# Patient Record
Sex: Male | Born: 1940
Health system: Southern US, Community
[De-identification: ages and names within clinical notes are randomized; demographics above are authoritative.]

## PROBLEM LIST (undated history)

## (undated) ENCOUNTER — Ambulatory Visit: Admission: EM | Payer: Medicare Other | Source: Home / Self Care

## (undated) DIAGNOSIS — R0609 Other forms of dyspnea: Secondary | ICD-10-CM

## (undated) DIAGNOSIS — Z8719 Personal history of other diseases of the digestive system: Secondary | ICD-10-CM

## (undated) DIAGNOSIS — E785 Hyperlipidemia, unspecified: Secondary | ICD-10-CM

## (undated) DIAGNOSIS — H6120 Impacted cerumen, unspecified ear: Secondary | ICD-10-CM

## (undated) DIAGNOSIS — Z85828 Personal history of other malignant neoplasm of skin: Secondary | ICD-10-CM

## (undated) DIAGNOSIS — I10 Essential (primary) hypertension: Secondary | ICD-10-CM

## (undated) DIAGNOSIS — F0390 Unspecified dementia without behavioral disturbance: Secondary | ICD-10-CM

## (undated) DIAGNOSIS — M1990 Unspecified osteoarthritis, unspecified site: Secondary | ICD-10-CM

## (undated) DIAGNOSIS — N183 Chronic kidney disease, stage 3 (moderate): Secondary | ICD-10-CM

## (undated) DIAGNOSIS — R0600 Dyspnea, unspecified: Secondary | ICD-10-CM

## (undated) DIAGNOSIS — K2270 Barrett's esophagus without dysplasia: Secondary | ICD-10-CM

## (undated) DIAGNOSIS — I509 Heart failure, unspecified: Secondary | ICD-10-CM

## (undated) DIAGNOSIS — H269 Unspecified cataract: Secondary | ICD-10-CM

## (undated) DIAGNOSIS — T7840XA Allergy, unspecified, initial encounter: Secondary | ICD-10-CM

## (undated) DIAGNOSIS — I25810 Atherosclerosis of coronary artery bypass graft(s) without angina pectoris: Secondary | ICD-10-CM

## (undated) DIAGNOSIS — N400 Enlarged prostate without lower urinary tract symptoms: Secondary | ICD-10-CM

## (undated) DIAGNOSIS — K4090 Unilateral inguinal hernia, without obstruction or gangrene, not specified as recurrent: Secondary | ICD-10-CM

## (undated) DIAGNOSIS — I259 Chronic ischemic heart disease, unspecified: Secondary | ICD-10-CM

## (undated) DIAGNOSIS — K219 Gastro-esophageal reflux disease without esophagitis: Secondary | ICD-10-CM

## (undated) DIAGNOSIS — I200 Unstable angina: Secondary | ICD-10-CM

## (undated) DIAGNOSIS — I219 Acute myocardial infarction, unspecified: Secondary | ICD-10-CM

## (undated) DIAGNOSIS — K4190 Unilateral femoral hernia, without obstruction or gangrene, not specified as recurrent: Secondary | ICD-10-CM

## (undated) DIAGNOSIS — J189 Pneumonia, unspecified organism: Secondary | ICD-10-CM

## (undated) DIAGNOSIS — I2510 Atherosclerotic heart disease of native coronary artery without angina pectoris: Secondary | ICD-10-CM

## (undated) DIAGNOSIS — M519 Unspecified thoracic, thoracolumbar and lumbosacral intervertebral disc disorder: Secondary | ICD-10-CM

## (undated) DIAGNOSIS — R9431 Abnormal electrocardiogram [ECG] [EKG]: Secondary | ICD-10-CM

## (undated) DIAGNOSIS — I119 Hypertensive heart disease without heart failure: Secondary | ICD-10-CM

## (undated) HISTORY — DX: Benign prostatic hyperplasia without lower urinary tract symptoms: N40.0

## (undated) HISTORY — DX: Unstable angina: I20.0

## (undated) HISTORY — PX: COLONOSCOPY: SHX174

## (undated) HISTORY — DX: Abnormal electrocardiogram (ECG) (EKG): R94.31

## (undated) HISTORY — DX: Hypertensive heart disease without heart failure: I11.9

## (undated) HISTORY — DX: Atherosclerotic heart disease of native coronary artery without angina pectoris: I25.10

## (undated) HISTORY — DX: Gastro-esophageal reflux disease without esophagitis: K21.9

## (undated) HISTORY — PX: EYE SURGERY: SHX253

## (undated) HISTORY — DX: Unspecified osteoarthritis, unspecified site: M19.90

## (undated) HISTORY — DX: Unilateral inguinal hernia, without obstruction or gangrene, not specified as recurrent: K40.90

## (undated) HISTORY — DX: Acute myocardial infarction, unspecified: I21.9

## (undated) HISTORY — DX: Unspecified cataract: H26.9

## (undated) HISTORY — DX: Chronic ischemic heart disease, unspecified: I25.9

## (undated) HISTORY — DX: Hyperlipidemia, unspecified: E78.5

## (undated) HISTORY — DX: Heart failure, unspecified: I50.9

## (undated) HISTORY — DX: Unilateral femoral hernia, without obstruction or gangrene, not specified as recurrent: K41.90

## (undated) HISTORY — DX: Barrett's esophagus without dysplasia: K22.70

## (undated) HISTORY — DX: Impacted cerumen, unspecified ear: H61.20

## (undated) HISTORY — DX: Chronic kidney disease, stage 3 (moderate): N18.3

## (undated) HISTORY — DX: Essential (primary) hypertension: I10

## (undated) HISTORY — DX: Pneumonia, unspecified organism: J18.9

## (undated) HISTORY — DX: Allergy, unspecified, initial encounter: T78.40XA

## (undated) HISTORY — DX: Other forms of dyspnea: R06.09

---

## 1981-04-28 HISTORY — PX: INGUINAL HERNIA REPAIR: SUR1180

## 1983-04-29 HISTORY — PX: NOSE SURGERY: SHX723

## 1998-02-20 ENCOUNTER — Encounter: Payer: Self-pay | Admitting: Emergency Medicine

## 1998-02-20 ENCOUNTER — Emergency Department (HOSPITAL_COMMUNITY): Admission: EM | Admit: 1998-02-20 | Discharge: 1998-02-20 | Payer: Self-pay | Admitting: Emergency Medicine

## 2000-02-26 ENCOUNTER — Emergency Department (HOSPITAL_COMMUNITY): Admission: EM | Admit: 2000-02-26 | Discharge: 2000-02-26 | Payer: Self-pay | Admitting: Emergency Medicine

## 2000-02-26 ENCOUNTER — Encounter: Payer: Self-pay | Admitting: Emergency Medicine

## 2000-04-28 DIAGNOSIS — I219 Acute myocardial infarction, unspecified: Secondary | ICD-10-CM

## 2000-04-28 HISTORY — PX: CORONARY ARTERY BYPASS GRAFT: SHX141

## 2000-04-28 HISTORY — DX: Acute myocardial infarction, unspecified: I21.9

## 2000-06-29 ENCOUNTER — Encounter: Payer: Self-pay | Admitting: Orthopedic Surgery

## 2000-06-29 ENCOUNTER — Inpatient Hospital Stay (HOSPITAL_COMMUNITY): Admission: EM | Admit: 2000-06-29 | Discharge: 2000-07-01 | Payer: Self-pay

## 2000-08-05 ENCOUNTER — Encounter: Payer: Self-pay | Admitting: Cardiothoracic Surgery

## 2000-08-06 ENCOUNTER — Inpatient Hospital Stay (HOSPITAL_COMMUNITY): Admission: AD | Admit: 2000-08-06 | Discharge: 2000-08-10 | Payer: Self-pay | Admitting: Cardiovascular Disease

## 2000-08-06 ENCOUNTER — Encounter: Payer: Self-pay | Admitting: Cardiothoracic Surgery

## 2000-08-07 ENCOUNTER — Encounter: Payer: Self-pay | Admitting: Cardiothoracic Surgery

## 2000-08-08 ENCOUNTER — Encounter: Payer: Self-pay | Admitting: Cardiothoracic Surgery

## 2000-08-08 ENCOUNTER — Encounter: Payer: Self-pay | Admitting: Thoracic Surgery (Cardiothoracic Vascular Surgery)

## 2004-03-26 ENCOUNTER — Ambulatory Visit: Payer: Self-pay | Admitting: Internal Medicine

## 2004-05-30 ENCOUNTER — Ambulatory Visit: Payer: Self-pay | Admitting: Internal Medicine

## 2004-06-04 ENCOUNTER — Ambulatory Visit: Payer: Self-pay | Admitting: Internal Medicine

## 2004-06-14 ENCOUNTER — Ambulatory Visit: Payer: Self-pay

## 2004-08-11 ENCOUNTER — Emergency Department (HOSPITAL_COMMUNITY): Admission: EM | Admit: 2004-08-11 | Discharge: 2004-08-11 | Payer: Self-pay | Admitting: Emergency Medicine

## 2004-08-14 ENCOUNTER — Ambulatory Visit: Payer: Self-pay | Admitting: Internal Medicine

## 2004-08-28 ENCOUNTER — Ambulatory Visit: Payer: Self-pay | Admitting: Internal Medicine

## 2004-09-26 ENCOUNTER — Ambulatory Visit: Payer: Self-pay | Admitting: Internal Medicine

## 2004-12-13 ENCOUNTER — Ambulatory Visit: Payer: Self-pay | Admitting: Internal Medicine

## 2005-04-15 ENCOUNTER — Ambulatory Visit: Payer: Self-pay | Admitting: Internal Medicine

## 2005-05-27 ENCOUNTER — Ambulatory Visit: Payer: Self-pay | Admitting: Internal Medicine

## 2005-08-11 ENCOUNTER — Ambulatory Visit: Payer: Self-pay | Admitting: Internal Medicine

## 2005-08-26 ENCOUNTER — Ambulatory Visit: Payer: Self-pay | Admitting: Internal Medicine

## 2005-12-02 ENCOUNTER — Ambulatory Visit: Payer: Self-pay | Admitting: Internal Medicine

## 2006-02-16 ENCOUNTER — Ambulatory Visit: Payer: Self-pay | Admitting: Gastroenterology

## 2006-03-02 ENCOUNTER — Ambulatory Visit: Payer: Self-pay | Admitting: Gastroenterology

## 2006-03-30 ENCOUNTER — Ambulatory Visit: Payer: Self-pay | Admitting: Internal Medicine

## 2006-06-29 ENCOUNTER — Ambulatory Visit: Payer: Self-pay | Admitting: Internal Medicine

## 2006-08-11 ENCOUNTER — Ambulatory Visit: Payer: Self-pay | Admitting: Internal Medicine

## 2006-09-28 ENCOUNTER — Ambulatory Visit: Payer: Self-pay | Admitting: Internal Medicine

## 2006-12-04 ENCOUNTER — Ambulatory Visit: Payer: Self-pay | Admitting: Internal Medicine

## 2006-12-04 LAB — CONVERTED CEMR LAB
ALT: 29 units/L (ref 0–53)
AST: 32 units/L (ref 0–37)
Alkaline Phosphatase: 86 units/L (ref 39–117)
BUN: 18 mg/dL (ref 6–23)
Basophils Relative: 0.6 % (ref 0.0–1.0)
Bilirubin Urine: NEGATIVE
Bilirubin, Direct: 0.1 mg/dL (ref 0.0–0.3)
CO2: 30 meq/L (ref 19–32)
Calcium: 9 mg/dL (ref 8.4–10.5)
Chloride: 97 meq/L (ref 96–112)
Creatinine, Ser: 1.1 mg/dL (ref 0.4–1.5)
Eosinophils Relative: 4.3 % (ref 0.0–5.0)
Glucose, Bld: 119 mg/dL — ABNORMAL HIGH (ref 70–99)
Lymphocytes Relative: 31.4 % (ref 12.0–46.0)
Monocytes Relative: 10.5 % (ref 3.0–11.0)
Neutro Abs: 3.5 10*3/uL (ref 1.4–7.7)
Nitrite: NEGATIVE
Platelets: 192 10*3/uL (ref 150–400)
RBC: 4.7 M/uL (ref 4.22–5.81)
RDW: 12.4 % (ref 11.5–14.6)
Specific Gravity, Urine: 1.01 (ref 1.000–1.03)
Total Bilirubin: 1.1 mg/dL (ref 0.3–1.2)
Total CHOL/HDL Ratio: 2.8
Total Protein: 7.6 g/dL (ref 6.0–8.3)
Urine Glucose: NEGATIVE mg/dL
VLDL: 23 mg/dL (ref 0–40)
WBC: 6.5 10*3/uL (ref 4.5–10.5)
pH: 8.5 — AB (ref 5.0–8.0)

## 2006-12-17 ENCOUNTER — Ambulatory Visit: Payer: Self-pay | Admitting: Internal Medicine

## 2007-02-22 ENCOUNTER — Ambulatory Visit: Payer: Self-pay | Admitting: Internal Medicine

## 2007-08-31 ENCOUNTER — Telehealth (INDEPENDENT_AMBULATORY_CARE_PROVIDER_SITE_OTHER): Payer: Self-pay | Admitting: *Deleted

## 2007-09-09 ENCOUNTER — Ambulatory Visit: Payer: Self-pay | Admitting: Internal Medicine

## 2007-09-09 DIAGNOSIS — J309 Allergic rhinitis, unspecified: Secondary | ICD-10-CM | POA: Insufficient documentation

## 2007-09-09 DIAGNOSIS — I119 Hypertensive heart disease without heart failure: Secondary | ICD-10-CM | POA: Insufficient documentation

## 2007-09-09 DIAGNOSIS — E785 Hyperlipidemia, unspecified: Secondary | ICD-10-CM | POA: Insufficient documentation

## 2007-09-09 DIAGNOSIS — M2550 Pain in unspecified joint: Secondary | ICD-10-CM | POA: Insufficient documentation

## 2007-09-13 ENCOUNTER — Ambulatory Visit: Payer: Self-pay | Admitting: Internal Medicine

## 2007-10-01 ENCOUNTER — Encounter: Payer: Self-pay | Admitting: Internal Medicine

## 2008-01-04 ENCOUNTER — Encounter: Admission: RE | Admit: 2008-01-04 | Discharge: 2008-02-08 | Payer: Self-pay | Admitting: Orthopedic Surgery

## 2008-01-17 ENCOUNTER — Ambulatory Visit: Payer: Self-pay | Admitting: Internal Medicine

## 2008-04-04 ENCOUNTER — Ambulatory Visit: Payer: Self-pay | Admitting: Internal Medicine

## 2008-04-04 DIAGNOSIS — J069 Acute upper respiratory infection, unspecified: Secondary | ICD-10-CM | POA: Insufficient documentation

## 2008-04-05 LAB — CONVERTED CEMR LAB
AST: 42 units/L — ABNORMAL HIGH (ref 0–37)
Alkaline Phosphatase: 76 units/L (ref 39–117)
Basophils Absolute: 0.1 10*3/uL (ref 0.0–0.1)
Bilirubin, Direct: 0.1 mg/dL (ref 0.0–0.3)
CO2: 29 meq/L (ref 19–32)
Chloride: 104 meq/L (ref 96–112)
Cholesterol: 154 mg/dL (ref 0–200)
Eosinophils Absolute: 0.2 10*3/uL (ref 0.0–0.7)
Eosinophils Relative: 3.2 % (ref 0.0–5.0)
GFR calc non Af Amer: 64 mL/min
Hemoglobin, Urine: NEGATIVE
LDL Cholesterol: 70 mg/dL (ref 0–99)
Leukocytes, UA: NEGATIVE
Lymphocytes Relative: 28.7 % (ref 12.0–46.0)
MCHC: 35.1 g/dL (ref 30.0–36.0)
MCV: 95.6 fL (ref 78.0–100.0)
Mucus, UA: NEGATIVE
Neutrophils Relative %: 58.8 % (ref 43.0–77.0)
Platelets: 174 10*3/uL (ref 150–400)
Potassium: 4 meq/L (ref 3.5–5.1)
RBC: 4.48 M/uL (ref 4.22–5.81)
Sodium: 140 meq/L (ref 135–145)
Specific Gravity, Urine: 1.01 (ref 1.000–1.03)
Squamous Epithelial / LPF: NEGATIVE /lpf
TSH: 2.09 microintl units/mL (ref 0.35–5.50)
Total Bilirubin: 0.9 mg/dL (ref 0.3–1.2)
Total CHOL/HDL Ratio: 2.8
Urine Glucose: NEGATIVE mg/dL
Urobilinogen, UA: 0.2 (ref 0.0–1.0)
VLDL: 29 mg/dL (ref 0–40)
WBC: 7.8 10*3/uL (ref 4.5–10.5)

## 2008-04-11 ENCOUNTER — Ambulatory Visit: Payer: Self-pay | Admitting: Internal Medicine

## 2008-05-02 ENCOUNTER — Ambulatory Visit: Payer: Self-pay

## 2008-05-02 ENCOUNTER — Ambulatory Visit: Payer: Self-pay | Admitting: Pulmonary Disease

## 2008-09-26 ENCOUNTER — Ambulatory Visit: Payer: Self-pay | Admitting: Internal Medicine

## 2008-10-26 DIAGNOSIS — I2510 Atherosclerotic heart disease of native coronary artery without angina pectoris: Secondary | ICD-10-CM | POA: Insufficient documentation

## 2008-10-27 ENCOUNTER — Ambulatory Visit: Payer: Self-pay | Admitting: Internal Medicine

## 2008-11-23 ENCOUNTER — Emergency Department (HOSPITAL_COMMUNITY): Admission: EM | Admit: 2008-11-23 | Discharge: 2008-11-23 | Payer: Self-pay | Admitting: Emergency Medicine

## 2009-04-09 ENCOUNTER — Ambulatory Visit: Payer: Self-pay | Admitting: Internal Medicine

## 2009-04-09 DIAGNOSIS — N401 Enlarged prostate with lower urinary tract symptoms: Secondary | ICD-10-CM

## 2009-04-09 DIAGNOSIS — N138 Other obstructive and reflux uropathy: Secondary | ICD-10-CM | POA: Insufficient documentation

## 2009-04-11 LAB — CONVERTED CEMR LAB
AST: 31 units/L (ref 0–37)
Albumin: 4 g/dL (ref 3.5–5.2)
Alkaline Phosphatase: 79 units/L (ref 39–117)
Basophils Absolute: 0.1 10*3/uL (ref 0.0–0.1)
Calcium: 9.2 mg/dL (ref 8.4–10.5)
GFR calc non Af Amer: 63.93 mL/min (ref >60)
Glucose, Bld: 107 mg/dL — ABNORMAL HIGH (ref 70–99)
HDL: 50.8 mg/dL (ref >39.00)
Hemoglobin: 15.5 g/dL (ref 13.0–17.0)
LDL Cholesterol: 60 mg/dL (ref 0–99)
Lymphocytes Relative: 31.1 % (ref 12.0–46.0)
Monocytes Relative: 12.7 % — ABNORMAL HIGH (ref 3.0–12.0)
Neutro Abs: 3.6 10*3/uL (ref 1.4–7.7)
RDW: 12.6 % (ref 11.5–14.6)
Sodium: 141 meq/L (ref 135–145)
Specific Gravity, Urine: <=1.005 (ref 1.000–1.030)
Total Bilirubin: 1.1 mg/dL (ref 0.3–1.2)
Total CHOL/HDL Ratio: 3
Urine Glucose: NEGATIVE mg/dL
VLDL: 39.4 mg/dL (ref 0.0–40.0)
WBC: 7 10*3/uL (ref 4.5–10.5)

## 2009-05-24 ENCOUNTER — Telehealth: Payer: Self-pay | Admitting: Internal Medicine

## 2009-07-20 ENCOUNTER — Ambulatory Visit: Payer: Self-pay | Admitting: Internal Medicine

## 2009-07-20 DIAGNOSIS — B359 Dermatophytosis, unspecified: Secondary | ICD-10-CM | POA: Insufficient documentation

## 2010-05-30 NOTE — Progress Notes (Signed)
Summary: refill  Phone Note Call from Patient Call back at Home Phone [phone]   Caller: Patient Call For: Mylene Bow Summary of Call: Pt needs a refill on omeprazole 20mg  says he has been out for about 1 month, and has called repeatedly for refill. Initial call taken by: Darletta Moll,  May 24, 2009 1:55 PM  Follow-up for Phone Call        rx sent. pt aware.Carron Curie CMA  May 24, 2009 1:57 PM     Prescriptions: OMEPRAZOLE 20 MG CPDR (OMEPRAZOLE) Take 1 capsule by mouth two times a day  #60 x 3   Entered by:   Carron Curie CMA   Authorized by:   Nyoka Cowden MD   Signed by:   Carron Curie CMA on 05/24/2009   Method used:   Electronically to        [facility] [facility] [address] (retail)       [address]       [place], [place]  [address]       Ph: [phone]       Fax: [phone]   RxID:   [id number]

## 2010-05-30 NOTE — Assessment & Plan Note (Signed)
Summary: Primary svc/ eval ? tinea left ankle   Primary Provider/Referring Provider:  Sherene [name]  CC:  3 month followup.  Pt c/o itchy rash above left ankle.  States that he noticed this about 3 wks ago and it gets worse every day.  Isaac [name]  History of Present Illness: 70 yowm with h/l IHD/HBP  ? radicular Left leg/foot pain followed by [name].  April 04, 2008 comprehensive eval 1. No change in left leg pain or advil requirement 2. Cough x 24 h slt yellow and st, no fever, cp or sob--rx doxy x 7 d  April 11, 2008 --returned for follow up and ear wash and lab review. Trouble hearing, ears stopped up, using otc debrox no help. XR showed chronic changes. Labs normal with LDL at 70 and TC at 154 on simvastatin. cough and congestion better.  September 26, 2008 ok except for the "problem with the foot" x sev years  describes burning pain along left lateral calf ankle and foot extending all the way to the toe, says Dr [name] reported all the ligaments and tendons were all wornt out.  no longer responding to advil. denies loss of strength in legs/feet or back pain or claudication symptoms.  lyrica solved it  April 09, 2009 returns fasting for comprehensive eval c/o urinary urgency just in am, no incontinence, dribbling or decreased fos/dysuria/nocturia.  July 20, 2009 3 month followup.  Pt c/o itchy rash above left ankle.  States that he noticed this about 3 wks ago and it gets worse every day.  puruitic.  no unusual exposures. Pt denies any significant sore throat, dysphagia, itching, sneezing,  nasal congestion or excess secretions,  fever, chills, sweats, unintended wt loss, pleuritic or exertional cp, hempoptysis, change in activity tolerance  orthopnea pnd or leg swelling    Current Medications (verified): 1)  Atenolol-Chlorthalidone 50-25 Mg Tabs (Atenolol-Chlorthalidone) .... Take 1 Tablet By Mouth Once A Day 2)  Adult Aspirin Ec Low Strength 81 Mg  Tbec (Aspirin) .... Take 1 Tablet By Mouth Once A  Day 3)  Centrum Silver  Tabs (Multiple Vitamins-Minerals) .Isaac [name].. 1 Once Daily 4)  Omeprazole 20 Mg Cpdr (Omeprazole) .... Take 1 Capsule By Mouth Two Times A Day 5)  Simvastatin 20 Mg Tabs (Simvastatin) .... Take 1 Tablet By Mouth Once A Day 6)  Nasonex 50 Mcg/act  Susp (Mometasone Furoate) .... Take 1-2 Sprays in Each Nostril Twice A Day 7)  Afrin Nasal Spray 0.05 %  Soln (Oxymetazoline Hcl) .... As Needed 8)  Aleve 220 Mg Tabs (Naproxen Sodium) .... Take Per Bottle Instructions With Meals 9)  Lyrica 75 Mg Caps (Pregabalin) .Isaac [name].. 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   wt 198 > 189 > 192 April 09, 2009 > 190 July 21, 2009  HEENT: nl dentition, turbinates, and orophanx. R ext ear canal  cerumen impaction, L canal nl with nl TM Neck without JVD/Nodes/TM Lungs clear to A and P bilaterally without cough on insp or exp maneuvers RRR no s3 or murmur or increase in P2 Abd soft and benign with nl excursion in the supine position. No bruits or organomegaly  EXT no edema,  from left ankle, nl pulses    Impression & Recommendations:  Problem # 1:  RINGWORM  (ICD-110.9)  Classic lesion left ankle.  rx lotrisone and f/u derm prn  Orders: Est. Patient Level III ([address])  Problem # 2:  HYPERTENSION (ICD-401.9)  His updated medication list for this problem includes:    Atenolol-chlorthalidone 50-25 Mg Tabs (Atenolol-chlorthalidone) .Isaac [name]... Take 1 tablet by mouth once a day  Orders: Est. Patient Level III ([address])  Problem # 3:  HYPERLIPIDEMIA (ICD-272.4)  Good control on rx His updated medication list for this problem includes:    Simvastatin 20 Mg Tabs (Simvastatin) .Isaac [name]... Take 1 tablet by mouth once a day  Labs Reviewed: SGOT: 31 (04/09/2009)   SGPT: 33 (04/09/2009)  Prior 10 Yr Risk Heart Disease: 11 % (09/26/2008)   HDL:50.80 (04/09/2009), 54.6 (04/04/2008)  LDL:60 (04/09/2009), 70 (04/04/2008)  Chol:150 (04/09/2009), 154 (04/04/2008)  Trig:197.0 (04/09/2009), 146 (04/04/2008)  Orders: Est. Patient Level III ([address])  Medications Added to Medication List This Visit: 1)  Lotrisone 1-0.05 % Crea (Clotrimazole-betamethasone) .... Apply twice daily  Patient Instructions: 1)  Lotrisone twice daily until rash is gone and if not gone within two weeks see Dr Jorja Loa 2)  Return for CPX Dec 2011, call sooner if needed Prescriptions: LOTRISONE 1-0.05 % CREA (CLOTRIMAZOLE-BETAMETHASONE) apply twice daily  #15 gm x 2   Entered and Authorized by:   Nyoka Cowden MD   Signed by:   Nyoka Cowden MD on 07/20/2009   Method used:   Electronically to        [facility]  [address] [facility] [address] (retail)       [address]       [place], [place]  [address]       Ph: [phone]       Fax: [phone]   RxID:   [id number]

## 2010-07-03 ENCOUNTER — Other Ambulatory Visit: Payer: Self-pay | Admitting: Dermatology

## 2010-07-04 ENCOUNTER — Encounter: Payer: Self-pay | Admitting: Internal Medicine

## 2010-07-04 ENCOUNTER — Ambulatory Visit (INDEPENDENT_AMBULATORY_CARE_PROVIDER_SITE_OTHER)
Admission: RE | Admit: 2010-07-04 | Discharge: 2010-07-04 | Disposition: A | Discharge status: Home / Self Care | Payer: Medicare Other | Source: Ambulatory Visit | Attending: Internal Medicine | Admitting: Internal Medicine

## 2010-07-04 ENCOUNTER — Encounter (INDEPENDENT_AMBULATORY_CARE_PROVIDER_SITE_OTHER): Payer: Medicare Other | Admitting: Internal Medicine

## 2010-07-04 ENCOUNTER — Other Ambulatory Visit: Payer: Medicare Other

## 2010-07-04 ENCOUNTER — Other Ambulatory Visit: Payer: Self-pay | Admitting: Internal Medicine

## 2010-07-04 DIAGNOSIS — Z0000 Encounter for general adult medical examination without abnormal findings: Secondary | ICD-10-CM

## 2010-07-04 LAB — CBC WITH DIFFERENTIAL/PLATELET
Basophils Absolute: 0 10*3/uL (ref 0.0–0.1)
Eosinophils Relative: 3.1 % (ref 0.0–5.0)
MCV: 95.6 fl (ref 78.0–100.0)
Monocytes Absolute: 0.8 10*3/uL (ref 0.1–1.0)
Monocytes Relative: 11 % (ref 3.0–12.0)
Neutrophils Relative %: 54.3 % (ref 43.0–77.0)
Platelets: 149 10*3/uL — ABNORMAL LOW (ref 150.0–400.0)
WBC: 7.2 10*3/uL (ref 4.5–10.5)

## 2010-07-04 LAB — LIPID PANEL
Cholesterol: 150 mg/dL (ref 0–200)
HDL: 47 mg/dL (ref >39.00)
LDL Cholesterol: 65 mg/dL (ref 0–99)
Total CHOL/HDL Ratio: 3
Triglycerides: 191 mg/dL — ABNORMAL HIGH (ref 0.0–149.0)
VLDL: 38.2 mg/dL (ref 0.0–40.0)

## 2010-07-04 LAB — BASIC METABOLIC PANEL
BUN: 16 mg/dL (ref 6–23)
Creatinine, Ser: 1.2 mg/dL (ref 0.4–1.5)
GFR: 65.59 mL/min (ref >60.00)

## 2010-07-04 LAB — URINALYSIS
Nitrite: NEGATIVE
Specific Gravity, Urine: 1.015 (ref 1.000–1.030)
Total Protein, Urine: NEGATIVE
pH: 8.5 (ref 5.0–8.0)

## 2010-07-04 LAB — HEPATIC FUNCTION PANEL
Bilirubin, Direct: 0.2 mg/dL (ref 0.0–0.3)
Total Bilirubin: 0.8 mg/dL (ref 0.3–1.2)

## 2010-07-05 LAB — CONVERTED CEMR LAB
PSA, Free Pct: 38 (ref >25)
PSA, Free: 0.6 ng/mL
PSA: 1.56 ng/mL (ref <=4.00)

## 2010-07-09 NOTE — Assessment & Plan Note (Signed)
Summary: Primary svc/ cpx   Primary Provider/Referring Provider:  Sherene Sires  CC:  cpx fasting.  History of Present Illness: 16 yowm with h/l IHD/HBP  ? radicular Left leg/foot pain followed by [name].  April 04, 2008 comprehensive eval 1. No change in left leg pain or advil requirement 2. Cough x 24 h slt yellow and st, no fever, cp or sob--rx doxy x 7 d  April 11, 2008 --returned for follow up and ear wash and lab review. Trouble hearing, ears stopped up, using otc debrox no help. XR showed chronic changes. Labs normal with LDL at 70 and TC at 154 on simvastatin. cough and congestion better.  September 26, 2008 ok except for the "problem with the foot" x sev years  describes burning pain along left lateral calf ankle and foot extending all the way to the toe, says Dr [name] reported all the ligaments and tendons were all wornt out.  no longer responding to advil. denies loss of strength in legs/feet or back pain or claudication symptoms.  lyrica solved it  April 09, 2009 cpx  July 04, 2010 cpx. c/o head cold with nasal congestion, scratchy throat and discolored sputum x 1 week.   Pt denies any significant sore throat, dysphagia, itching, sneezing,  nasal congestion or excess secretions,  fever, chills, sweats, unintended wt loss, pleuritic or exertional cp, hempoptysis, change in activity tolerance  orthopnea pnd or leg swelling Pt also denies any obvious fluctuation in symptoms with weather or environmental change or other alleviating or aggravating factors.         Current Medications (verified): 1)  Atenolol-Chlorthalidone 50-25 Mg Tabs (Atenolol-Chlorthalidone) .... Take 1 Tablet By Mouth Once A Day 2)  Adult Aspirin Ec Low Strength 81 Mg  Tbec (Aspirin) .... Take 1 Tablet By Mouth Once A Day 3)  Centrum Silver  Tabs (Multiple Vitamins-Minerals) .[name].. 1 Once Daily 4)  Omeprazole 20 Mg Cpdr (Omeprazole) .... Take 1 Capsule By Mouth Two Times A Day 5)  Simvastatin 20 Mg Tabs  (Simvastatin) .... Take 1 Tablet By Mouth Once A Day 6)  Nasonex 50 Mcg/act  Susp (Mometasone Furoate) .... Take 1-2 Sprays in Each Nostril Twice A Day 7)  Afrin Nasal Spray 0.05 %  Soln (Oxymetazoline Hcl) .... As Needed 8)  Aleve 220 Mg Tabs (Naproxen Sodium) .... Take Per Bottle Instructions With Meals 9)  Lyrica 75 Mg Caps (Pregabalin) .[name].. 1 By Mouth Two Times A Day 10)  Lotrisone 1-0.05 % Crea (Clotrimazole-Betamethasone) .... Apply Twice Daily  Allergies (verified): No Known Drug Allergies  Past History:  Past Medical History: ISCHEMIC HEART DISEASE (ICD-414.9) ARTHRALGIA (ICD-719.40) HYPERTENSION (ICD-401.9) HYPERLIPIDEMIA (ICD-272.4) ALLERGIC RHINITIS (ICD-477.9) HEALTH MAINTENANCE.................................................................Marland KitchenWert    -Td 2/06    -Pneumovax 12/06    -CPX July 04, 2010  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.........................................Marland KitchenCharlann Boxer  Social History: never smoked remarried after first wife died  ( emphysema Sep 13, 2001) No ETOH  Vital Signs:  Patient profile:   70 year old male Weight:      189 pounds BMI:     26.46 O2 Sat:      98 % on Room air Temp:     97.9 degrees F oral Pulse rate:   77 / minute BP sitting:   118 / 62  (left arm)  Vitals Entered By: Vernie Murders (July 04, 2010 8:47 AM)  O2 Flow:  Room air  Physical Exam  Additional Exam:  Ambulatory healthy appearing in no acute distress.  wt 198 > 189 > 192 April 09, 2009 > 190 July 21, 2009 > 189 July 04, 2010  HEENT: nl dentition, turbinates, and orophanx. R ext ear canal  cerumen impaction, L canal nl with nl TM. Edentulous Neck without JVD/Nodes/TM Lungs clear to A and P bilaterally without cough on insp or exp maneuvers RRR no s3 or murmur or increase in P2 Abd soft and benign with nl excursion in the supine position. No bruits or organomegaly  EXT no edema,  from left ankle, nl pulses GU testes down, no ih Rectal mild bph     Cholesterol               150 mg/dL                   9-147     ATP III Classification            Desirable:  < 200 mg/dL                    Borderline High:  200 - 239 mg/dL               High:  > = 240 mg/dL   Triglycerides        [H]  191.0 mg/dL                 8.2-956.2     Normal:  <150 mg/dL     Borderline High:  130 - 199 mg/dL   HDL                       86.57 mg/dL                 >84.69   VLDL Cholesterol          38.2 mg/dL                  6.2-95.2   LDL Cholesterol           65 mg/dL                    8-41  CHO/HDL Ratio:  CHD Risk                             3                    Men          Women     1/2 Average Risk     3.4          3.3     Average Risk          5.0          4.4     2X Average Risk          9.6          7.1     3X Average Risk          15.0          11.0                           Tests: (2) BMP (METABOL)   Sodium                    141 mEq/L  135-145   Potassium                 4.5 mEq/L                   3.5-5.1   Chloride                  101 mEq/L                   96-112   Carbon Dioxide            31 mEq/L                    19-32   Glucose                   99 mg/dL                    04-54   BUN                       16 mg/dL                    0-98   Creatinine                1.2 mg/dL                   1.1-9.1   Calcium                   9.4 mg/dL                   4.7-82.9   GFR                       65.59 mL/min                >60.00  Tests: (3) CBC Platelet w/Diff (CBCD)   White Cell Count          7.2 K/uL                    4.5-10.5   Red Cell Count            4.59 Mil/uL                 4.22-5.81   Hemoglobin                15.2 g/dL                   56.2-13.0   Hematocrit                43.8 %                      39.0-52.0   MCV                       95.6 fl                     78.0-100.0   MCHC                      34.7 g/dL                   86.5-78.4   RDW  13.4 %                       11.5-14.6   Platelet Count       [L]  149.0 K/uL                  150.0-400.0   Neutrophil %              54.3 %                      43.0-77.0   Lymphocyte %              31.0 %                      12.0-46.0   Monocyte %                11.0 %                      3.0-12.0   Eosinophils%              3.1 %                       0.0-5.0   Basophils %               0.6 %                       0.0-3.0   Neutrophill Absolute      3.9 K/uL                    1.4-7.7   Lymphocyte Absolute       2.2 K/uL                    0.7-4.0   Monocyte Absolute         0.8 K/uL                    0.1-1.0  Eosinophils, Absolute                             0.2 K/uL                    0.0-0.7   Basophils Absolute        0.0 K/uL                    0.0-0.1  Tests: (4) Hepatic/Liver Function Panel (HEPATIC)   Total Bilirubin           0.8 mg/dL                   2.9-5.6   Direct Bilirubin          0.2 mg/dL                   2.1-3.0   Alkaline Phosphatase      73 U/L                      39-117   AST                       33 U/L  0-37   ALT                       34 U/L                      0-53   Total Protein             7.3 g/dL                    1.6-1.0   Albumin                   4.1 g/dL                    9.6-0.4  Tests: (5) TSH (TSH)   FastTSH                   2.03 uIU/mL                 0.35-5.50  Tests: (6) UDip Only (UDIP)   Color                     LT. YELLOW       RANGE:  Yellow;Lt. Yellow   Clarity                   CLEAR                       Clear   Specific Gravity          1.015                       1.000 - 1.030   Urine Ph                  8.5                         5.0-8.0   Protein                   NEGATIVE                    Negative   Urine Glucose             NEGATIVE                    Negative   Ketones                   NEGATIVE                    Negative   Urine Bilirubin           NEGATIVE                    Negative   Blood                      NEGATIVE                    Negative   Urobilinogen              0.2                         0.0 - 1.0   Leukocyte Esterace  NEGATIVE                    Negative   Nitrite                   NEGATIVE                    Negative  CXR  Procedure date:  07/04/2010  Findings:       Comparison: April 09, 2009  Findings: The cardiac silhouette, mediastinum, pulmonary vasculature are within normal limits.  Both lungs are clear. There is no acute bony abnormality.  IMPRESSION: There is no evidence of acute cardiac or pulmonary process.  Impression & Recommendations:  Problem # 1:  HYPERLIPIDEMIA (ICD-272.4)  His updated medication list for this problem includes:    Simvastatin 20 Mg Tabs (Simvastatin) .[name]... Take 1 tablet by mouth once a day  Target LDL < 70 achieved on present rx  Problem # 2:  URI, ACUTE (ICD-465.9)  His updated medication list for this problem includes:    Adult Aspirin Ec Low Strength 81 Mg Tbec (Aspirin) .[name]... Take 1 tablet by mouth once a day    Aleve 220 Mg Tabs (Naproxen sodium) .[name]... Take per bottle instructions with meals  rx zpak  Problem # 3:  ISCHEMIC HEART DISEASE (ICD-414.9)  His updated medication list for this problem includes:    Atenolol-chlorthalidone 50-25 Mg Tabs (Atenolol-chlorthalidone) .[name]... Take 1 tablet by mouth once a day    Adult Aspirin Ec Low Strength 81 Mg Tbec (Aspirin) .[name]... Take 1 tablet by mouth once a day  Problem # 4:  ALLERGIC RHINITIS (ICD-477.9)  His updated medication list for this problem includes:    Nasonex 50 Mcg/act Susp (Mometasone furoate) .[name]... Take 1-2 sprays in each nostril twice a day    Afrin Nasal Spray 0.05 % Soln (Oxymetazoline hcl) .[name]... As needed  See instructions for specific recommendations   Medications Added to Medication List This Visit: 1)  Nasonex 50 Mcg/act Susp (Mometasone furoate) .... Take 1-2 sprays in each nostril twice a day 2)  Zithromax Z-pak 250 Mg Tabs (Azithromycin)  .... Take as directed  Other Orders: EKG w/ Interpretation (93000) Est. Patient 65& > (78469) T-2 View CXR (71020TC) T-PSA Free ([address]) TLB-Lipid Panel (80061-LIPID) TLB-BMP (Basic Metabolic Panel-BMET) (80048-METABOL) TLB-CBC Platelet - w/Differential (85025-CBCD) TLB-Hepatic/Liver Function Pnl (80076-HEPATIC) TLB-TSH (Thyroid Stimulating Hormone) (84443-TSH) TLB-Udip ONLY (81003-UDIP)  Patient Instructions: 1)  zpak has been called in, this should turn your mucus white again if not call after one week 2)  I emphasized that nasal steroids(nasonex) have no immediate benefit in terms of improving symptoms.  To help them reached the target tissue, the patient should use Afrin two puffs every 12 hours applied one min before using the nasal steroids.  Afrin should be stopped after no more than 5 days.  If the symptoms worsen, Afrin can be restarted after 5 days off of therapy to prevent rebound congestion from overuse of Afrin.  I also emphasized that in no way are nasal steroids a concern in terms of "addiction". 3)  Call [phone] for your results w/in next 3 days - if there's something important  I feel you need to know,  I'll be in touch with you directly.  4)  Return yearly for cpx, call in meantime if needed Prescriptions: ZITHROMAX Z-PAK 250 MG TABS (AZITHROMYCIN) take as directed  #1 x 0   Entered and Authorized by:   Nyoka Cowden  MD   Signed by:   Nyoka Cowden MD on 07/04/2010   Method used:   Electronically to        [facility] [address] [facility] (retail)       [address]       [place], [place]  [address]       Ph: [phone]       Fax: [phone]   RxID:   [id number] NASONEX 50 MCG/ACT  SUSP (MOMETASONE FUROATE) Take 1-2 sprays in each nostril twice a day  #1 x 11   Entered and Authorized by:   Nyoka Cowden MD   Signed by:   Nyoka Cowden MD on 07/04/2010   Method used:   Electronically to        [facility] [address] (retail)       [address]  [address]       [place], [place]  [address]       Ph: [phone]       Fax: [phone]   RxID:   [id number]

## 2010-08-14 ENCOUNTER — Other Ambulatory Visit: Payer: Self-pay | Admitting: Internal Medicine

## 2010-08-22 ENCOUNTER — Other Ambulatory Visit: Payer: Self-pay | Admitting: Internal Medicine

## 2010-09-10 NOTE — Assessment & Plan Note (Signed)
[facility]                             PULMONARY OFFICE NOTE   Isaac [name], Isaac [name]                        MRN:          [id number]  DATE:12/04/2006                            DOB:          Apr 19, 1941    HISTORY:  A 70 year old white male with a history of ischemic heart  disease, presenting as syncope and leading to CABG in April of 2002.  Since that time I have followed him for hypertension and hyperlipidemia  here. 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 He states  the pain begins in Isaac right sacroiliac and radiates to the hip on the  right, but not further than that, and denies any classic radicular  symptoms or aggravation with positions. It simply hurts the more he  walks on it. Denies any morning stiffness or history of rash.  Specifically he denies any exertional chest pain, orthopnea, PND, TIA,  or claudication symptoms, or significant dyspnea. He is rarely using  Advair although it is recommended to be taken up to 2400 mg per day, he  rarely uses more than one or two 200 tablets but says it helps some.   PAST MEDICAL HISTORY:  1. Hypertension.  2. Hyperlipidemia target LDL less than 80.  3. Ischemic heart disease status post CABG 2002.  4. Barrett's esophagitis by most recent endoscopy February 2007.  5. Diverticulosis, most recent colonoscopy November of 2007.  6. DJD of the C-spine.   ALLERGIES:  None.   MEDICATIONS:  Taken in detail on the worksheet. See column dated December 04, 2006.   SOCIAL HISTORY:  He has never smoked, drinks only occasional alcohol.   FAMILY HISTORY:  Negative for premature heart disease. Positive for  hypertension in Isaac [name]. Breast cancer in Isaac mother, as well as  diabetes in Isaac mother. Negative for colon or prostate cancer.   REVIEW OF  SYSTEMS:  Taken in detail on the worksheet, negative except as  outlined above.   PHYSICAL EXAMINATION:  This is a stoic ambulatory white male in no acute  distress. He is afebrile, normal vital signs, with a blood pressure  132/82.  HEENT: Edentulous. He has right greater than left ear wax impaction.  NECK: Supple without cervical adenopathy, tenderness. Dentition is  intact. Carotid upstrokes were brisk without any bruits. There is no  tracheal deviation or thyroid enlargement.  LUNG FIELDS: Completely clear bilaterally to auscultation and  percussion.  There is a regular rate and rhythm without murmur, gallop, or rub.  ABDOMEN: Soft, benign with no palpable organomegaly, masses, or  tenderness. No aortic enlargement.  Femoral pulses were present with no bruits.  GENITOURINARY: Testes descended bilaterally without nodules.  RECTAL: Revealed mild BPH, smooth texture. Stool guiac negative.  EXTREMITIES: Warm without calf tenderness, cyanosis, clubbing, or edema.  Pedal pulses were symmetric bilaterally.  NEUROLOGIC: No focal deficits or pathologic reflexes.  SKIN EXAM: Warm and dry.  LABORATORY DATA:  Chest x-ray and EKG were normal. LFTs were normal, LDL  cholesterol was 84 with an HDL of 58, TSH was normal, CRP was 5.0,  urinalysis was unremarkable, CBC and chemistry profile were  unremarkable.   IMPRESSION:  1. Hypertension, adequately controlled on Isaac present regimen which I      did not change.  2. Hyperlipidemia, with LDL at target. CRP is a bit elevated but I      suspect this is related to arthritis in Isaac hip, and I did ask him      to continue to take an aspirin daily with no change in Isaac      simvastatin regimen.  3. Barretts esophagitis, I recommended that he continue omeprazole      b.i.d. plus diet reviewed today.  4. Right hip pain, this was documented a year ago and he has been seen      by Dr. [name] in the past. I have encouraged him to use Advil more       consistently and then see Dr. [name] in followup and report whether      it is helping.  5. Ear wax impaction bilaterally, I have recommended over-the-counter      medications and return in a week to have Isaac ears lavaged at that      time we will go over all of Isaac lab data.  6. General health maintenance, he was updated on tetanus in 2006,      pneumovax in 2006, and colonoscopy was done in 2007. Based on a      normal prostate exam, and the absence of a family history, a PSA is      not strongly indicated, and I gave the patient the options, and he      declined screening at this point.     Charlaine Dalton. Sherene Sires, MD, [facility]  Electronically Signed    MBW/MedQ  DD: 12/07/2006  DT: 12/07/2006  Job #: [id number]

## 2010-09-10 NOTE — Assessment & Plan Note (Signed)
[facility]                             PULMONARY OFFICE NOTE   Isaac [name], Isaac [name]                        MRN:          [id number]  DATE:02/22/2007                            DOB:          [birth year]    HISTORY:  A 70 year old white male, in for followup evaluation of  hypertension and hyperlipidemia, complaining of increasing nasal  congestion and sore throat for the last several days.  In retrospect, he  has had trouble for the last 6-8 months with similar complaints that  never really cleared except when he was taking Nasonex.  He had  previously been advised to use Nasonex as a maintenance and taper down  to 1 nightly Afrin p.r.n.  He got these instructions backwards and  stopped the Nasonex but continues to use the Afrin on a daily basis.  He  denies any purulent secretions, fevers, chills, sweats,  orthopnea, PND  or leg swelling.   For full inventory of medications, please see face sheet column dated  02/22/2007.   PHYSICAL EXAMINATION:  He is a pleasant ambulatory white man in no acute  distress.  VITAL SIGNS:  Blood pressure 120/66.  HEENT:  Remarkable for nonspecific turbinate edema/erythema.  Oropharynx  is clear.  NECK:  Supple without cervical adenopathy or tenderness.  Trachea is  midline.  No thyromegaly.  LUNG FIELDS:  Completely clear bilaterally on auscultation and  percussion.  He has a regular rhythm without murmur, gallop or rub.  ABDOMEN:  Soft, benign.  EXTREMITIES:  Warm without calf tenderness, cyanosis, clubbing, edema.  Swelling of dip, right index finger.   IMPRESSION:  1. 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  I spent extra time going over the strategy to use      Nasonex b.i.d. with the option to taper to nightly dosing, but to      use Afrin only for 5 days, as reflected in previous written      instructions which were reviewed with him today, but also pointed      out  that the same instructions were reviewed with him in June 29, 2006.  2. Hypertension and hyperlipidemia.  Adequate control on present      regimen.  I reviewed this with him writing.  3. I filled out work papers for him which require intermediate      strength PPD placed, which was done today.  4. I noted that he has swelling in the distal interphalangeal  of the      right index finger, consistent with synovial cyst.  I have      recommended , since it is not causing any pain, that he see Dr.      [name] if it bothers him at all, and let Dr. [name], his orthopedist on      record, refer him to a hand specialist if needed.  Followup will be      every 3 months, sooner if needed.  5. Flu vaccination was given today.  Charlaine Dalton. Sherene Sires, MD, Isaac [name]  Electronically Signed   MBW/MedQ  DD: 02/22/2007  DT: 02/23/2007  Job #: [id number]

## 2010-09-10 NOTE — Assessment & Plan Note (Signed)
[facility]                             PULMONARY OFFICE NOTE   Isaac [name], Isaac [name]                        MRN:          [id number]  DATE:12/17/2006                            DOB:          14-Jul-1940    HISTORY OF PRESENT ILLNESS:  The patient is a 70 year old white male  patient of Dr. Thurston Hole who has a known history of hypertension,  hyperlipidemia and a Barrett's esophagitis.  Returns today for a 2 week  followup.  Last visit lab work revealed patient's total cholesterol 165  with an LDL of 84 and an HDL of 58.  Patient is maintained on  simvastatin 20 mg at bedtime.  Patient's thyroid, CBC was normal.  His  electrolyte panel showed a BUN and creatinine of 18 and 1.1 respectively  and blood sugar of 119.  Since last visit patient reports he has no new  complaints.  Does have intermittent hip pain but seems to be improved  since last visit, using Advil.  Patient does complain of decreased  hearing with wax buildup.   PAST MEDICAL HISTORY:  Reviewed.   CURRENT MEDICATIONS:  Reviewed.   EXAMINATION:  Patient is a pleasant male in no acute distress.  He is  afebrile with stable vital signs.  Blood pressure is 142/88.  HEENT:  EACs bilateral cerumen impactions.  NECK:  Supple without adenopathy.  LUNGS:  Sounds are clear.  CARDIAC:  Regular rate.  EXTREMITIES:  Warm without any edema.   IMPRESSION AND PLAN:  1. Hypertension currently adequately controlled.  2. Hyperlipidemia with an LDL at target goal.  Patient will continue      on present regimen along with low-fat, low-cholesterol diet and      exercise.  3. Barrett's esophagitis, continue on omeprazole twice daily along      with reflux diet.  4. Right hip pain, continue on Advil p.r.n. and follow up with Dr.      Cornelius Moras as needed.  5. Bilateral cerumen impaction.  Patient's irrigation was done with      wax extraction.  Patient's      external auditory canals and tympanic membranes were  normal post      irrigation.  Patient is advised to use peroxide or Debrox as      needed.     Rubye Oaks, NP  Electronically Signed      Isaac Dalton. Sherene Sires, MD, [facility]  Electronically Signed   TP/MedQ  DD: 12/17/2006  DT: 12/18/2006  Job #: [id number]

## 2010-09-10 NOTE — Assessment & Plan Note (Signed)
[facility]                         ELECTROPHYSIOLOGY OFFICE NOTE   NICKHOLAS, GOLDSTON                        MRN:          [id number]  DATE:09/13/2007                            DOB:          04-01-1941    HISTORY OF PRESENT ILLNESS:  Mr. Pons returns today for follow-up.  He  is a very pleasant 70 year old male with a ischemic heart disease and  preserved LV function who is status post bypass surgery in the past.  He  returns today for follow-up.  He overall feels well.  He has been very  active fishing, cutting grass and working around his house.  He denies  chest pain.  He denies shortness of breath.  He denies peripheral edema.  His only complaint today is some persistent left knee pain.   CURRENT MEDICATIONS:  1. Atenolol 50 alternating with 25 daily.  2. Centrum Silver.  3. Omeprazole 20 twice daily.  4. Simvastatin 20 a day.  5. Aspirin 81 mg a day .   PHYSICAL EXAMINATION:  GENERAL:  He is a pleasant, well-appearing 3-  year-old man in no distress.  VITAL SIGNS:  Blood pressure was 125/80, pulse 66 and regular,  respirations were 18, weight was 190 pounds.  NECK:  Revealed no jugular distention.  LUNGS:  Clear bilaterally to auscultation.  No wheezes, rales or rhonchi  are present.  There are no wheezes, rubs or gallops.  CARDIAC:  Exam regular rhythm.  Normal S1-S2.  I did not appreciate an  S3 or S4 gallop today.  PMI was not enlarged nor was it laterally  displaced.  ABDOMEN:  Soft, nontender.  There is no organomegaly.  EXTREMITIES:  Demonstrated no cyanosis, clubbing or edema.  Pulses 2+  symmetric.   STUDIES:  EKG demonstrates sinus rhythm with normal axis and intervals.   IMPRESSION:  1. Ischemic heart disease with preserved LV function.  2. Dyslipidemia   DISCUSSION:  Overall, Mr. Pember is stable.  He is doing very well and is  asymptomatic from the perspective of coronary disease.  I will plan to  see the patient back  in the office in one year, sooner should his  symptoms change.     Doylene Canning. [name], MD  Electronically Signed   GWT/MedQ  DD: 09/13/2007  DT: 09/13/2007  Job #: [id number]

## 2010-09-10 NOTE — Assessment & Plan Note (Signed)
[facility]                             PULMONARY OFFICE NOTE   Isaac [name], Isaac [name]                        MRN:          [id number]  DATE:09/28/2006                            DOB:          06-Sep-1940    PRIMARY SERVICE/FOLLOWUP OFFICE VISIT   HISTORY:  A 70 year old white male with a history of sudden death  secondary to ischemic heart disease.  He has never had any form of  exertional chest pain or dyspnea.  He continues to be very active, but  not doing aerobics, but denies any exertional chest pain or GI or  claudication symptoms.   For full inventory of medications, please see face sheet dated September 28, 2006.   PHYSICAL EXAMINATION:  He is a pleasant, ambulatory white male in no  acute distress.  VITAL SIGNS:  Stable vital signs.  Blood pressure 138/80.  HEENT:  Unremarkable.  Oropharynx is clear.  LUNGS:  Fields are clear bilaterally to auscultation and percussion.  CARDIAC:  Regular rate and rhythm without murmur, gallop, or rub.  ABDOMEN:  Soft and benign.  EXTREMITIES:  Warm without calf tenderness, cyanosis, clubbing, or  edema.   Most recent lipid profile indicated an LDL of only 62 on his present  regimen, dated August 2007 with normal LFTs.   IMPRESSION:  1. Hypertension is adequately controlled on his present regimen.  2. Hyperlipidemia is adequately controlled as well.  3. The patient will need to return for comprehensive health care      evaluation.   ADDENDUM:  Most recent cardiology note reviewed from August 11, 2006  indicating that he needs to be seen every year for followup of coronary  artery disease and prn symptoms.  Because he presented with sudden  death, rather than ever experiencing exertional chest pain, I have  deferred how often do to screening Cardiolites to Dr. Doylene Canning. [facility]  capable hands.     Charlaine Dalton. Sherene Sires, MD, [facility]  Electronically Signed    MBW/MedQ  DD: 09/29/2006  DT: 09/29/2006  Job #:  [id number]   cc:   Doylene Canning. [name], MD

## 2010-09-11 ENCOUNTER — Other Ambulatory Visit: Payer: Self-pay | Admitting: *Deleted

## 2010-09-11 MED ORDER — SIMVASTATIN 20 MG PO TABS: 20.0000 mg | ORAL_TABLET | Freq: Every day | ORAL | Status: DC

## 2010-09-11 MED ORDER — OMEPRAZOLE 20 MG PO CPDR: 20.0000 mg | DELAYED_RELEASE_CAPSULE | Freq: Every day | ORAL | Status: DC

## 2010-09-11 MED ORDER — ATENOLOL-CHLORTHALIDONE 50-25 MG PO TABS: 1.0000 | ORAL_TABLET | Freq: Every day | ORAL | Status: DC

## 2010-09-13 NOTE — Assessment & Plan Note (Signed)
[facility]                             PULMONARY OFFICE NOTE   Isaac [name], Isaac [name]                        MRN:          [id number]  DATE:03/30/2006                            DOB:          [birth year]    PRIMARY SERVICE/FOLLOWUP OFFICE VISIT   HISTORY:  A 70 year old white male with hypertension and mild arthritis  seen for comprehensive evaluation on December 02, 2005 and at goal in terms  of all of his medical care.  He was having problems with right hip pain  and received a cortisone injection, which seems to help quite a bit.  He  also takes a few Advil a day.  However, he denies any exertional chest  pain, orthopnea, PND, or leg swelling.   He comes in with a persistent nasal congestion, itching, and sneezing  over the last month that has failed to respond to over-the-counter  medications.  For a full inventory of medications, please see face sheet  dated March 30, 2006.   PHYSICAL EXAMINATION:  He is a pleasant, ambulatory white male in no  acute distress.  Blood pressure 132/86.  HEENT:  Moderate nonspecific turbinate edema.  Oropharynx clear.  No  evidence of excessive post-nasal drainage or cobblestoning.  NECK:  Supple without cervical adenopathy or tenderness. Trachea is  midline.  No thyromegaly.  LUNGS:  Fields are perfectly clear bilaterally to auscultation and  percussion.  CARDIAC:  Regular rate and rhythm without murmur, gallop, or rub.  ABDOMEN:  Soft and benign.  EXTREMITIES:  Warm without calf tenderness, cyanosis, clubbing, or  edema.   IMPRESSION:  1. Hypertension, at goal.  2. Right hip pain, better status post steroid injection with p.r.n.      Advil.  He was told that this is bursitis, although this is not the      diagnosis in the notes.  As long as Advil takes care of the      problem, the exact source of the inflammation is a moot issue.  3. Persistent rhinitis with nonspecific features.  I have recommended      a  6-day course of prednisone, since he has tried over-the-counter      antihistamines without any benefit, and probably should avoid      excess decongestants due to his hypertension.  Followup will be in      3 months.  Sooner if needed.     Charlaine Dalton. Sherene Sires, MD, [facility]  Electronically Signed    MBW/MedQ  DD: 03/30/2006  DT: 03/30/2006  Job #: [id number]
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
## 2010-09-13 NOTE — Assessment & Plan Note (Signed)
[facility]                            CARDIOLOGY OFFICE NOTE   TELLIS, SPIVAK                        MRN:          [id number]  DATE:08/11/2006                            DOB:          02/21/1941    Mr. Maltese returns today for followup.  He is a very pleasant 70 year old  man with a history of coronary disease status post bypass surgery,  hypertension, and dyslipidemia who returns today for followup.  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  He had no  specific complaints today.  He denies syncope.  He has otherwise been  stable.  He did note that he had some ankle problems that had been  bothering him from arthritis.   EXAM:  He is a pleasant, well-appearing 70 year old man in no distress.  Blood pressure 136/78, the pulse 83 and regular, respirations were 18,  the weight was 192 pounds, down 2 pounds from his visit a year ago.  NECK:  No jugular venous distention.  LUNGS:  Clear bilaterally to auscultation.  No wheezes, rales or  rhonchi.  CARDIOVASCULAR EXAM:  A regular rate and rhythm with a normal S1 and S2.  The PMI was not enlarged nor was it laterally displaced and I did not  appreciate an S4 gallop.  ABDOMINAL EXAM:  Soft, nontender.  EXTREMITIES:  Demonstrated no edema.  NEUROLOGIC EXAM:  Alert and oriented x3 with cranial nerves intact.   EKG demonstrates sinus rhythm with normal axis and intervals.   IMPRESSION:  1. Ischemic heart disease.  2. Dyslipidemia.   DISCUSSION:  The patient is stable.  He will continue on with his beta-  blocker, diuretic, aspirin and Statin therapy for all of the above.  I  will plan to see him back in the office in 1 year for followup of his  coronary disease and dyslipidemia.  If he has chest pain or shortness of  breath he is instructed to call us.     Doylene Canning. [name], MD  Electronically  Signed    GWT/MedQ  DD: 08/11/2006  DT: 08/11/2006  Job #: [id number]
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
## 2010-09-13 NOTE — Assessment & Plan Note (Signed)
[facility]                           PRIMARY CARE OFFICE NOTE   Isaac [name], Isaac [name]                        MRN:          [id number]  DATE:06/29/2006                            DOB:          11-21-1940    PRIMARY SERVICE/ACUTE EXTENDED OFFICE VISIT   A 70 year old white male in for return for followup evaluation of  hyperlipidemia and hypertension complaining of a persistent sensation of  a head cold for the last three months with nasal congestion for which  he previously was treated with a short course of prednisone with no  benefit. His sensation is one of persistent nasal congestion especially  on the left side where he cannot breathe through his nose. He denies any  pleuritic pain, fevers, chills, sweats, orthopnea or PND or leg  swelling.   For full details of medications, please see face sheet dated June 29, 2006.   PHYSICAL EXAMINATION:  He is a pleasant ambulatory white male in no  acute distress. He has stable vital signs. Blood pressure 126/82.  HEENT: Reveals moderate turbinate edema bilaterally. Oropharynx clear  with no evidence of excessive post-nasal drainage or cobblestoning.  NECK: Supple without cervical adenopathy or tenderness. Trachea is  midline without lymphadenopathy.  LUNGS: Lung fields are clear bilaterally to auscultation and percussion.  HEART: Regular rate and rhythm without murmur, gallop or rub.  ABDOMEN: Soft, benign.  EXTREMITIES: Warm without calf tenderness, cyanosis, clubbing or edema.   IMPRESSION:  Extended discussion with this patient lasting 15-20 to 25  minutes on the following topics:  1. Hypertension is well-controlled on his present regimen.  2. Hyperlipidemia with a history of ischemic heart disease, therefore      target LDL of less than 100. He has reached this LDL by most recent      lab profile with no evidence of adverse drug effect on Zocor as      reviewed on the study done on August 2007 and  therefore no change      is recommended.  3. The main issue today is poorly controlled rhinitis which did not      appear to respond in a convincing fashion to prednisone. I added      Nasonex two puffs b.i.d. to his present regimen and explained how      to use Afrin effectively for this along with a six day course of      prednisone and a 10 day course of Augmentin. If not improved after      the above regimen is complete I would like him to call for a sinus      CT scan.   Otherwise, followup can be every three months, sooner if needed.    Charlaine Dalton. Sherene Sires, MD, [facility]  Electronically Signed   MBW/MedQ  DD: 06/29/2006  DT: 06/29/2006  Job #: [id number]

## 2010-09-13 NOTE — Assessment & Plan Note (Signed)
[facility]                               PULMONARY OFFICE NOTE   Isaac [name], Isaac [name]                        MRN:          [id number]  DATE:12/02/2005                            DOB:          [birth year]    HISTORY:  This is a 70 year old white male with hypertension and  hyperlipidemia that is complicated by ischemic heart disease, status post  CABG in 2002, who is still physically active, going up and down steps  carrying heavy tools and hardware without any difficult in terms of  exertional chest pain, orthopnea, PND or leg swelling.  He is followed here  for both hypertension and hyperlipidemia, now off of the study drug at [facility] and on Simvastatin 20 mg q.p.m. which she is tolerating well to date.   His main limiting problem is right hip pain which actually has been present  for years, worse for the last month especially when he sits on it.  He  finds he cannot roll over on it at night and is now in the habit of using  three Advil at bedtime so he can get some sleep.  He denies any radiation  down of the pain or history of rash over the area and pain with hot or cold  weather.  He has not tried to take Advil during the day and finds as long as  he does not sit on it for a long time he does fine.  He denies any change  of bowel or bladder habits.   PAST MEDICAL HISTORY:  1.  Hypertension.  2.  Hyperlipidemia, target LDL less than 80.  3.  Ischemic heart disease status post CABG in 2002.  4.  Barrett's esophagus by EGD June 30, 2003.  The patient did not keep the      appointment to have rescoping on May 30, 2005, and I reviewed that      with him today.  5.  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  6.  Diverticulosis by colonoscopy July 06, 2003.   ALLERGIES:  NONE.   MEDICATIONS:  Taken in detail on worksheet, dated December 02, 2005.   SOCIAL HISTORY:  He never smoked, only drinks occasional wine.   FAMILY HISTORY:  Negative for  premature atherosclerotic disease, positive  hypertension in his father, breast cancer in his mother as well as diabetes.   REVIEW OF SYSTEMS:  Taken in detail and significant only for the problems as  outlined above.   PHYSICAL EXAMINATION:  GENERAL APPEARANCE:  This is a pleasant, ambulatory  white male in no acute distress.  VITAL SIGNS:  He is afebrile with stable vital signs.  Blood pressure is  124/80.  HEENT:  He is edentulous.  Oropharynx is clear.  Nasal turbinates normal.  Limited funduscopy is normal.  Ear canals clear bilaterally.  NECK:  Supple without cervical adenopathy or tenderness.  Carotid upstrokes  were brisk bilaterally without any bruits.  CHEST:  Completely clear  bilaterally to auscultation and percussion.  __________.  CARDIOVASCULAR:  Regular rhythm without murmurs, rubs, or gallops present.  No displacement of PMI.  ABDOMEN:  Soft, benign with no palpable organomegaly, masses, tenderness or  aortic enlargement.  Pulses present bilaterally.  EXTREMITIES:  Extremities warm without calf tenderness, clubbing, cyanosis,  or edema.  Pedal pulses were present bilaterally and symmetric.  GENITOURINARY:  Testes descended bilaterally, no nodules.  RECTAL:  Mild BPH, smooth textured stool, guaiac negative.  NEUROLOGIC:  No focal deficits, pathologic reflexes.  MUSCULOSKELETAL:  He had minimal decrease in right hip rotation but  otherwise had normal right hip exam.  Straight leg raise testing was also  negative.   LABORATORY DATA:  Chemistry profile was normal.  TSH was normal.  CBC was  normal.  LDL cholesterol was only 62 with an HDL of 69.  LFTs were normal.   IMPRESSION:  1.  Hypertension, adequately controlled on his present regimen which I did      not change.  2.  Hyperlipidemia with LDL at target.  HDL also quite favorable, no change      in therapy therefore.  3.  I did ask him to continue to take an aspirin daily and keep up with      follow-up with Dr.  [name].  4.  Barrett's esophagitis.  I have reminded him he has a recall letter for      endoscopy dated May 30, 2005.  I would like him to go ahead and      schedule this.  5.  New right hip pain.  He thinks is has been present, in retrospect, for      five years, not typical of arthritis in that it hurts more to sit on it      than it does walk on it but it is relieved by Advil which I have asked      him to take up to 3 with meals and will arrange for follow-up by      [facility].   HEALTH MAINTENANCE:  She was updated on tetanus in 2006 and also Pneumovax  in 2006.   FOLLOW UP:  I will see him every three months, sooner if needed.                                   Charlaine Dalton. Sherene Sires, MD, [facility]   MBW/MedQ  DD:  12/03/2005  DT:  12/03/2005  Job #:  [id number]
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
## 2010-09-26 ENCOUNTER — Telehealth: Payer: Self-pay | Admitting: Internal Medicine

## 2010-09-26 MED ORDER — OMEPRAZOLE 20 MG PO CPDR: 20.0000 mg | DELAYED_RELEASE_CAPSULE | Freq: Two times a day (BID) | ORAL | Status: DC

## 2010-09-26 NOTE — Telephone Encounter (Signed)
Spoke with pt and notified of recs per [facility] and apologized for the inconveinence. Pt verbalized understanding.

## 2010-09-26 NOTE — Telephone Encounter (Signed)
RX for Omeprazole was sent in error for once a day and should have been twice a day. New RX called to [facility] with correct directions. Per [facility], they cannot send an additional # 90 but will send the new RX for # 180 to the pt. lmomtcb x 1 so pt can be informed.

## 2010-10-28 ENCOUNTER — Encounter: Payer: Self-pay | Admitting: Internal Medicine

## 2010-10-29 ENCOUNTER — Encounter: Payer: Self-pay | Admitting: Internal Medicine

## 2010-10-29 ENCOUNTER — Ambulatory Visit (INDEPENDENT_AMBULATORY_CARE_PROVIDER_SITE_OTHER): Payer: Medicare Other | Admitting: Internal Medicine

## 2010-10-29 VITALS — BP 141/84 | HR 77 | Ht 71.0 in | Wt 185.0 lb

## 2010-10-29 DIAGNOSIS — I259 Chronic ischemic heart disease, unspecified: Secondary | ICD-10-CM

## 2010-10-29 DIAGNOSIS — I10 Essential (primary) hypertension: Secondary | ICD-10-CM

## 2010-10-29 NOTE — Patient Instructions (Signed)
Your physician wants you to follow-up in: 12 months with Dr. Taylor. You will receive a reminder letter in the mail two months in advance. If you don't receive a letter, please call our office to schedule the follow-up appointment.    

## 2010-10-29 NOTE — Progress Notes (Signed)
HPI Isaac [name] returns today for followup. He is a very pleasant 70 year old man with known coronary disease status post myocardial infarction, hypertension, and dyslipidemia. He has done quite well. He denies chest pain or shortness of breath. 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 No syncope. No peripheral edema. No Known Allergies   Current Outpatient Prescriptions  Medication Sig Dispense Refill  . aspirin (ADULT ASPIRIN EC LOW STRENGTH) 81 MG EC tablet Take 81 mg by mouth daily.        [name] atenolol-chlorthalidone (TENORETIC) 50-25 MG per tablet Take 1 tablet by mouth daily.  90 tablet  4  . Cholecalciferol (VITAMIN D) 2000 UNITS tablet Take 2,000 Units by mouth daily.        . clotrimazole-betamethasone (LOTRISONE) cream Apply 1 application topically 2 (two) times daily.        . Multiple Vitamins-Minerals (CENTRUM SILVER) tablet Take 1 tablet by mouth daily.        . Omega-3 Fatty Acids (FISH OIL) 1000 MG CAPS Take by mouth.        [name] omeprazole (PRILOSEC) 20 MG capsule Take 1 capsule (20 mg total) by mouth 2 (two) times daily.  180 capsule  4  . oxymetazoline (AFRIN) 0.05 % nasal spray Place into the nose as needed.        . pregabalin (LYRICA) 75 MG capsule Take 75 mg by mouth 2 (two) times daily.        . simvastatin (ZOCOR) 20 MG tablet Take 1 tablet (20 mg total) by mouth at bedtime.  90 tablet  4  . DISCONTD: azithromycin (ZITHROMAX) 250 MG tablet Take 250 mg by mouth as directed.        [name] DISCONTD: naproxen sodium (ANAPROX) 220 MG tablet Take 220 mg by mouth as directed.           Past Medical History  Diagnosis Date  . Ischemic heart disease   . Arthralgia   . Hypertension   . Hyperlipidemia   . Allergic rhinitis   . Health maintenance examination     Td 2/06; Pneumovax 12/06; CPX 07/04/10; [name]  . Ankle pain, left     Chronic, ? radiculopathy; [name]    ROS:   All systems reviewed and negative except as noted in the HPI.   Past Surgical History  Procedure  Date  . Coronary artery bypass graft Sep 25, 2000     Family History  Problem Relation Age of Onset  . Breast cancer Mother   . Diabetes Mother   . Hypertension Father   . Diabetes Sister   . COPD Brother      History   Social History  . Marital Status: Married    Spouse Name: N/A    Number of Children: N/A  . Years of Education: N/A   Occupational History  . Not on file.   Social History Main Topics  . Smoking status: Never Smoker   . Smokeless tobacco: Not on file  . Alcohol Use: No  . Drug Use: Not on file  . Sexually Active: Not on file   Other Topics Concern  . Not on file   Social History Narrative   Remarried after first wife died of emphysema in 09-25-01     BP 141/84  Pulse 77  Ht 5\' 11"  (1.803 m)  Wt 185 lb (83.915 kg)  BMI 25.80 kg/m2  Physical Exam:  Well appearing NAD HEENT: Unremarkable Neck:  No JVD, no thyromegally Lymphatics:  No adenopathy  Back:  No CVA tenderness Lungs:  Clear. Well-healed sternotomy incision. HEART:  Regular rate rhythm, no murmurs, no rubs, no clicks Abd:  Soft, positive bowel sounds, no organomegally, no rebound, no guarding Ext:  2 plus pulses, no edema, no cyanosis, no clubbing Skin:  No rashes no nodules Neuro:  CN II through XII intact, motor grossly intact  EKG Normal sinus rhythm with inferior myocardial infarction.  Assess/Plan:

## 2010-10-29 NOTE — Assessment & Plan Note (Signed)
His blood pressure appears to be fairly well-controlled. He continue his current medical therapy. I've asked her to maintain a low-sodium diet.

## 2010-10-29 NOTE — Assessment & Plan Note (Signed)
He denies coronary symptoms. Specifically no chest pain or shortness of breath. He remains active. I've asked him to continue his current medical therapy.

## 2010-11-05 ENCOUNTER — Telehealth: Payer: Self-pay | Admitting: Internal Medicine

## 2010-11-05 NOTE — Telephone Encounter (Signed)
Patient wanted the nurse to know, he will be dropping off paperwork at the office today- DMV. Dr. [name] need to complete some section.

## 2010-11-05 NOTE — Telephone Encounter (Signed)
Dept Of Transportation paper received sent to [facility] 11/05/10/km

## 2010-11-05 NOTE — Telephone Encounter (Signed)
Will have Dr [name] fill out and return

## 2010-11-07 ENCOUNTER — Telehealth: Payer: Self-pay | Admitting: Internal Medicine

## 2010-11-07 NOTE — Telephone Encounter (Signed)
Spoke with Isaac [name] he will pick up in the morning

## 2010-11-07 NOTE — Telephone Encounter (Signed)
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 Pt would like to know if paper is ready, with Dr. Lubertha Basque signature, (permission). Please call pt to advise.

## 2010-11-21 ENCOUNTER — Other Ambulatory Visit: Payer: Self-pay | Admitting: Internal Medicine

## 2010-12-19 ENCOUNTER — Telehealth: Payer: Self-pay | Admitting: Internal Medicine

## 2010-12-19 NOTE — Telephone Encounter (Signed)
Dr. Sherene Sires, forms were placed in your look-at stack. Please advise once done.  Carron Curie, CMA

## 2010-12-24 NOTE — Telephone Encounter (Signed)
PT RETURNED CALL. HE HAS F/U APPT W/ MW ON 01/03/11. NOTHING FURTHER NEEDED. Hazel Sams

## 2010-12-24 NOTE — Telephone Encounter (Signed)
I have the forms, and MW states that he needs ov to complete. LMTCB.

## 2010-12-24 NOTE — Telephone Encounter (Signed)
Have these forms been completed? I do not see anything in EPIC. Please advise.Carron Curie, CMA

## 2011-01-03 ENCOUNTER — Encounter: Payer: Self-pay | Admitting: *Deleted

## 2011-01-03 ENCOUNTER — Encounter: Payer: Self-pay | Admitting: Internal Medicine

## 2011-01-03 ENCOUNTER — Ambulatory Visit (INDEPENDENT_AMBULATORY_CARE_PROVIDER_SITE_OTHER): Payer: Medicare Other | Admitting: Internal Medicine

## 2011-01-03 DIAGNOSIS — I259 Chronic ischemic heart disease, unspecified: Secondary | ICD-10-CM

## 2011-01-03 DIAGNOSIS — Z23 Encounter for immunization: Secondary | ICD-10-CM

## 2011-01-03 DIAGNOSIS — E785 Hyperlipidemia, unspecified: Secondary | ICD-10-CM

## 2011-01-03 DIAGNOSIS — I10 Essential (primary) hypertension: Secondary | ICD-10-CM

## 2011-01-03 NOTE — Assessment & Plan Note (Signed)
At [facility] on rx, f/u at cpx

## 2011-01-03 NOTE — Assessment & Plan Note (Signed)
F/u per Dr [name] is prn

## 2011-01-03 NOTE — Assessment & Plan Note (Signed)
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

## 2011-01-03 NOTE — Progress Notes (Signed)
Addended by: Christen Butter on: 01/03/2011 04:59 PM   Modules accepted: Orders

## 2011-01-03 NOTE — Progress Notes (Deleted)
sdf

## 2011-01-03 NOTE — Progress Notes (Signed)
Subjective:     Patient ID: Isaac [name], male   DOB: 13-Feb-1941, 70 y.o.   MRN: [id number]  HPI  26 yowm with h/l IHD/HBP ? radicular Left leg/foot pain followed by [name].   April 04, 2008 comprehensive eval  1. No change in left leg pain or advil requirement  2. Cough x 24 h slt yellow and st, no fever, cp or sob--rx doxy x 7 d   April 11, 2008 --returned for follow up and ear wash and lab review. Trouble hearing, ears stopped up, using otc debrox no help. XR showed chronic changes. Labs normal with LDL at 70 and TC at 154 on simvastatin. cough and congestion better.   September 26, 2008 ok except for the "problem with the foot" x sev years describes burning pain along left lateral calf ankle and foot extending all the way to the toe, says Dr [name] reported all the ligaments and tendons were all wornt out. no longer responding to advil. denies loss of strength in legs/feet or back pain or claudication symptoms. lyrica solved it   July 04, 2010 cpx. 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  Does not make him sleepy. No presyncope or syncope.  Pt denies any significant sore throat, dysphagia, itching, sneezing, nasal congestion or excess secretions, fever, chills, sweats, unintended wt loss, pleuritic or exertional cp, hempoptysis, change in activity tolerance orthopnea pnd or leg swelling Pt also denies any obvious fluctuation in symptoms with weather or environmental change or other alleviating or aggravating factors.       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  wt  192 April 09, 2009 > 190 July 21, 2009 > 189 July 04, 2010 > 01/03/2011  187  HEENT: nl dentition, turbinates, and orophanx. R ext ear canal cerumen impaction, L canal nl with nl TM. Edentulous  Neck without JVD/Nodes/TM  Lungs clear to A and P bilaterally without cough on insp or exp maneuvers  RRR no s3 or murmur or increase in P2  Abd soft and benign with nl excursion in the supine position. No bruits or organomegaly  EXT no edema, from left ankle, nl pulses    Assessment:          Plan:

## 2011-01-03 NOTE — Patient Instructions (Signed)
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

## 2011-06-30 ENCOUNTER — Encounter: Payer: Self-pay | Admitting: Internal Medicine

## 2011-06-30 ENCOUNTER — Ambulatory Visit (INDEPENDENT_AMBULATORY_CARE_PROVIDER_SITE_OTHER)
Admission: RE | Admit: 2011-06-30 | Discharge: 2011-06-30 | Disposition: A | Discharge status: Home / Self Care | Payer: Medicare Other | Source: Ambulatory Visit | Attending: Internal Medicine | Admitting: Internal Medicine

## 2011-06-30 ENCOUNTER — Ambulatory Visit (INDEPENDENT_AMBULATORY_CARE_PROVIDER_SITE_OTHER): Payer: Medicare Other | Admitting: Internal Medicine

## 2011-06-30 ENCOUNTER — Other Ambulatory Visit (INDEPENDENT_AMBULATORY_CARE_PROVIDER_SITE_OTHER): Payer: Medicare Other

## 2011-06-30 VITALS — BP 140/78 | HR 71 | Temp 98.3°F | Ht 70.0 in | Wt 189.0 lb

## 2011-06-30 DIAGNOSIS — N138 Other obstructive and reflux uropathy: Secondary | ICD-10-CM

## 2011-06-30 DIAGNOSIS — N401 Enlarged prostate with lower urinary tract symptoms: Secondary | ICD-10-CM | POA: Diagnosis not present

## 2011-06-30 DIAGNOSIS — E785 Hyperlipidemia, unspecified: Secondary | ICD-10-CM

## 2011-06-30 DIAGNOSIS — R05 Cough: Secondary | ICD-10-CM

## 2011-06-30 DIAGNOSIS — I10 Essential (primary) hypertension: Secondary | ICD-10-CM

## 2011-06-30 DIAGNOSIS — R059 Cough, unspecified: Secondary | ICD-10-CM | POA: Diagnosis not present

## 2011-06-30 DIAGNOSIS — R109 Unspecified abdominal pain: Secondary | ICD-10-CM

## 2011-06-30 DIAGNOSIS — Z23 Encounter for immunization: Secondary | ICD-10-CM | POA: Diagnosis not present

## 2011-06-30 LAB — CBC WITH DIFFERENTIAL/PLATELET
Basophils Relative: 0.6 % (ref 0.0–3.0)
Eosinophils Absolute: 0.2 10*3/uL (ref 0.0–0.7)
Eosinophils Relative: 2.6 % (ref 0.0–5.0)
HCT: 43.3 % (ref 39.0–52.0)
Lymphs Abs: 2 10*3/uL (ref 0.7–4.0)
MCHC: 34 g/dL (ref 30.0–36.0)
MCV: 95.7 fl (ref 78.0–100.0)
Monocytes Absolute: 0.7 10*3/uL (ref 0.1–1.0)
Neutro Abs: 3.8 10*3/uL (ref 1.4–7.7)
RBC: 4.52 Mil/uL (ref 4.22–5.81)
WBC: 6.7 10*3/uL (ref 4.5–10.5)

## 2011-06-30 LAB — HEPATIC FUNCTION PANEL
ALT: 30 U/L (ref 0–53)
Albumin: 3.8 g/dL (ref 3.5–5.2)
Total Protein: 7.1 g/dL (ref 6.0–8.3)

## 2011-06-30 LAB — URINALYSIS
Hgb urine dipstick: NEGATIVE
Leukocytes, UA: NEGATIVE
Nitrite: NEGATIVE
Specific Gravity, Urine: 1.015 (ref 1.000–1.030)
Urine Glucose: NEGATIVE
Urobilinogen, UA: 0.2 (ref 0.0–1.0)

## 2011-06-30 LAB — LIPID PANEL
Cholesterol: 138 mg/dL (ref 0–200)
LDL Cholesterol: 52 mg/dL (ref 0–99)
Triglycerides: 186 mg/dL — ABNORMAL HIGH (ref 0.0–149.0)
VLDL: 37.2 mg/dL (ref 0.0–40.0)

## 2011-06-30 LAB — BASIC METABOLIC PANEL
Calcium: 9.2 mg/dL (ref 8.4–10.5)
GFR: 65.4 mL/min (ref >60.00)
Glucose, Bld: 96 mg/dL (ref 70–99)
Sodium: 139 mEq/L (ref 135–145)

## 2011-06-30 LAB — PSA: PSA: 1.5 ng/mL (ref 0.10–4.00)

## 2011-06-30 MED ORDER — PREDNISONE (PAK) 10 MG PO TABS: ORAL_TABLET | ORAL | Status: AC

## 2011-06-30 NOTE — Progress Notes (Signed)
  Subjective:    Patient ID: Isaac [name], male    DOB: 12-17-1940   MRN: [id number]  HPI  56 yowm with h/l IHD/HBP ? radicular Left leg/foot pain followed by [name].    06/30/2011 f/u ov/Quintel Mccalla cc mostly dry cough x one month then new onset LLQ abd pain daily not present sleeping but daily waxes and wanes sev hours at a time no obvious trigger with activity or meals/flatus/ bms  Sleeping ok without nocturnal  or early am exacerbation  of respiratory  c/o's or need for noct saba. 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  HENT: Positive for congestion and postnasal drip. Negative for hearing loss, ear pain, nosebleeds, sore throat, facial swelling, rhinorrhea, sneezing, mouth sores, trouble swallowing, neck pain, neck stiffness, dental problem, voice change, sinus pressure, tinnitus and ear discharge.   Eyes: Negative for photophobia, discharge, itching and visual disturbance.  Respiratory: Positive for cough and chest tightness. Negative for apnea, choking, shortness of breath, wheezing and stridor.   Cardiovascular: Negative for chest pain, palpitations and leg swelling.  Gastrointestinal: Positive for abdominal pain. Negative for nausea, vomiting, constipation, blood in stool and abdominal distention.  Genitourinary: Positive for frequency.  Negative for dysuria, urgency, hematuria, flank pain, decreased urine volume and difficulty urinating.  Musculoskeletal: Positive for back pain. Negative for myalgias, joint swelling, arthralgias and gait problem.  Skin: Negative for color change, pallor and rash.  Neurological: Negative for dizziness, tremors, seizures, syncope, speech difficulty, weakness, light-headedness, numbness and headaches.  Hematological: Negative for adenopathy. Does not bruise/bleed easily.  Psychiatric/Behavioral: Positive for sleep disturbance. Negative for confusion and agitation. The patient is not nervous/anxious.        Objective:   Physical Exam  No teeth/ seb ker/tender sup LLq   Ambulatory healthy appearing in no acute distress.  wt 198 > 189 > 192 April 09, 2009 > 190 July 21, 2009 > 189 06/30/11 HEENT: Edentulous, nl turbinates, and orophanx. Ext canals ok Neck without JVD/Nodes/TM  Lungs clear to A and P bilaterally without cough on insp or exp maneuvers  RRR no s3 or murmur or increase in P2  Abd soft and benign with nl excursion in the supine position. No bruits or organomegaly  EXT no edema, from left ankle, nl pulses  MS nl gait, no restrictions or deformities Neuro intact sensorium, no motor deficits Skin   Few seb Keratoses GU testes down bilateral neg IH Rectal mild bph, stool g neg  06/30/11 cxr No active cardiopulmonary disease.     Assessment & Plan:

## 2011-06-30 NOTE — Patient Instructions (Signed)
Prednisone 10 mg take  4 each am x 2 days,   2 each am x 2 days,  1 each am x2days and stop  For cough use delsym cough syrup and stop fish oil until no longer coughing at all  Try prilosec 20mg   Take 30-60 min before first meal of the day and Pepcid 20 mg one bedtime until cough is completely gone for at least a week without the need for cough suppression  I think of reflux for chronic cough like I do oxygen for fire (doesn't cause the fire but once you get the oxygen suppressed it usually goes away regardless of the exact cause).   GERD (REFLUX)  is an extremely common cause of respiratory symptoms, many times with no significant heartburn at all.    It can be treated with medication, but also with lifestyle changes including avoidance of late meals, excessive alcohol, smoking cessation, and avoid fatty foods, chocolate, peppermint, colas, red wine, and acidic juices such as orange juice.  NO MINT OR MENTHOL PRODUCTS SO NO COUGH DROPS  USE SUGARLESS CANDY INSTEAD (jolley ranchers or Stover's)  NO OIL BASED VITAMINS - use powdered substitutes.   Please schedule a follow up office visit in 2 weeks, sooner if needed.

## 2011-07-01 ENCOUNTER — Telehealth: Payer: Self-pay | Admitting: Internal Medicine

## 2011-07-01 NOTE — Telephone Encounter (Signed)
Pt informed of cxr results per dr Sherene Sires.

## 2011-07-03 NOTE — Assessment & Plan Note (Signed)
Adequate control on present rx, reviewed  

## 2011-07-03 NOTE — Assessment & Plan Note (Signed)
likely from coughing > See instructions for specific recommendations which were reviewed directly with the patient who was given a copy with highlighter outlining the key components.

## 2011-07-03 NOTE — Assessment & Plan Note (Signed)
The most common causes of chronic cough in immunocompetent adults include the following: upper airway cough syndrome (UACS), previously referred to as postnasal drip syndrome (PNDS), which is caused by variety of rhinosinus conditions; (2) asthma; (3) GERD; (4) chronic bronchitis from cigarette smoking or other inhaled environmental irritants; (5) nonasthmatic eosinophilic bronchitis; and (6) bronchiectasis.   These conditions, singly or in combination, have accounted for up to 94% of the causes of chronic cough in prospective studies.   Other conditions have constituted no >6% of the causes in prospective studies These have included bronchogenic carcinoma, chronic interstitial pneumonia, sarcoidosis, left ventricular failure, ACEI-induced cough, and aspiration from a condition associated with pharyngeal dysfunction.   This is most likley  Classic Upper airway cough syndrome, so named because it's frequently impossible to sort out how much is  CR/sinusitis with freq throat clearing (which can be related to primary GERD)   vs  causing  secondary (" extra esophageal")  GERD from wide swings in gastric pressure that occur with throat clearing, often  promoting self use of mint and menthol lozenges that reduce the lower esophageal sphincter tone and exacerbate the problem further in a cyclical fashion.   These are the same pts (now being labeled as having "irritable larynx syndrome" by some cough centers) who not infrequently have a history of having failed to tolerate ace inhibitors,  dry powder inhalers or biphosphonates or report having atypical reflux symptoms that don't respond to standard doses of PPI , and are easily confused as having aecopd or asthma flares by even experienced allergists/ pulmonologists.   See instructions for specific recommendations which were reviewed directly with the patient who was given a copy with highlighter outlining the key components.

## 2011-07-03 NOTE — Assessment & Plan Note (Signed)
-   Target LDL < 70 as has IHD  Adequate control on present rx, reviewed

## 2011-07-14 ENCOUNTER — Encounter: Payer: Self-pay | Admitting: Internal Medicine

## 2011-07-14 ENCOUNTER — Other Ambulatory Visit (INDEPENDENT_AMBULATORY_CARE_PROVIDER_SITE_OTHER): Payer: Medicare Other

## 2011-07-14 ENCOUNTER — Ambulatory Visit (INDEPENDENT_AMBULATORY_CARE_PROVIDER_SITE_OTHER): Payer: Medicare Other | Admitting: Internal Medicine

## 2011-07-14 DIAGNOSIS — K219 Gastro-esophageal reflux disease without esophagitis: Secondary | ICD-10-CM | POA: Diagnosis not present

## 2011-07-14 DIAGNOSIS — R059 Cough, unspecified: Secondary | ICD-10-CM | POA: Diagnosis not present

## 2011-07-14 DIAGNOSIS — R109 Unspecified abdominal pain: Secondary | ICD-10-CM

## 2011-07-14 DIAGNOSIS — R05 Cough: Secondary | ICD-10-CM | POA: Diagnosis not present

## 2011-07-14 LAB — BASIC METABOLIC PANEL
BUN: 18 mg/dL (ref 6–23)
CO2: 31 mEq/L (ref 19–32)
Calcium: 9.5 mg/dL (ref 8.4–10.5)
Creatinine, Ser: 1.2 mg/dL (ref 0.4–1.5)
Glucose, Bld: 109 mg/dL — ABNORMAL HIGH (ref 70–99)

## 2011-07-14 LAB — URINALYSIS, ROUTINE W REFLEX MICROSCOPIC
Hgb urine dipstick: NEGATIVE
Ketones, ur: NEGATIVE
Leukocytes, UA: NEGATIVE
Specific Gravity, Urine: 1.015 (ref 1.000–1.030)
Urobilinogen, UA: 0.2 (ref 0.0–1.0)

## 2011-07-14 LAB — CBC WITH DIFFERENTIAL/PLATELET
Basophils Absolute: 0.1 10*3/uL (ref 0.0–0.1)
Eosinophils Absolute: 0.2 10*3/uL (ref 0.0–0.7)
Lymphocytes Relative: 27.9 % (ref 12.0–46.0)
MCHC: 33.8 g/dL (ref 30.0–36.0)
Neutrophils Relative %: 56.7 % (ref 43.0–77.0)
RDW: 13.8 % (ref 11.5–14.6)

## 2011-07-14 MED ORDER — DOXAZOSIN MESYLATE 1 MG PO TABS: 1.0000 mg | ORAL_TABLET | Freq: Every day | ORAL | Status: DC

## 2011-07-14 NOTE — Assessment & Plan Note (Signed)
No clear IH or FH by exam and atypical urinary symptoms also  Rec try cardura 1 mg qhs to see if urination improves while waiting on results of CT scan and refer back to Dr Arlyce Dice since overdue for f/u for Barretts

## 2011-07-14 NOTE — Patient Instructions (Signed)
Add cardura 1 mg at bedtime to help you urinate  Please see patient coordinator before you leave today  to schedule ct abdomen  Please remember to go to the lab and x-ray department downstairs for your tests - we will call you with the results when they are available.

## 2011-07-14 NOTE — Progress Notes (Signed)
  Subjective:    Patient ID: Isaac [name], male    DOB: 28-Mar-1941   MRN: [id number]  HPI  7 yowm with h/o IHD/HBP ? radicular Left leg/foot pain followed by Isaac [name].    06/30/2011 f/u ov/Isaac [name] cc mostly dry cough x one month then new onset LLQ abd pain daily not present sleeping but daily waxes and wanes sev hours at a time no obvious trigger with activity or meals/flatus/ bms rec Prednisone 10 mg take  4 each am x 2 days,   2 each am x 2 days,  1 each am x2days and stop For cough use delsym cough syrup and stop fish oil until no longer coughing at all Try prilosec 20mg   Take 30-60 min before first meal of the day and Pepcid 20 mg one bedtime .  GERD  07/14/2011 f/u ov/Isaac [name] cough gone but  cc persistent x one month LLQ pain  Rad across ant abd to naval area plus difficulty with urination x 5 days  But no flank pain, hematuria, dysuria or use of otcs/antihistamine.  Abd pain waxes and wanes worse for maybe an hour then better but always present a little and not worse coughing. No pattern of pain in terms of alleviating or exacerbating.   Sleeping ok without nocturnal  or early am exacerbation  of respiratory  c/o's or need for noct saba. Also denies any obvious fluctuation of symptoms with weather or environmental changes or other aggravating or alleviating factors except as outlined above   ROS  At present neg for  any significant sore throat, dysphagia, itching, sneezing,  nasal congestion or excess/ purulent secretions,  fever, chills, sweats, unintended wt loss, pleuritic or exertional cp, hempoptysis, orthopnea pnd or leg swelling.  Also denies presyncope, palpitations, heartburn,   nausea, vomiting, diarrhea  or change in bowel or urinary habits,  ,  rash, arthralgias, visual complaints, headache, numbness weakness or ataxia.      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  wt 198 > 189 > 192 April 09, 2009 > 190 July 21, 2009 > 189 06/30/11 07/14/2011  HEENT: Edentulous, nl turbinates, and orophanx. Ext canals ok Neck without JVD/Nodes/TM  Lungs clear to A and P bilaterally without cough on insp or exp maneuvers  RRR no s3 or murmur or increase in P2  Abd soft and benign with nl excursion in the supine position. No bruits or organomegaly  EXT no edema, from left ankle, nl pulses  MS nl gait, no restrictions or deformities Neuro intact sensorium, no motor deficits Skin   Few seb Keratoses    06/30/11 cxr No active cardiopulmonary disease.     Assessment & Plan:

## 2011-07-14 NOTE — Assessment & Plan Note (Signed)
Resolved with rx of GERD and he has h/o Barrett's so def needs continued gerd rx/ reviewed.

## 2011-07-16 ENCOUNTER — Encounter: Payer: Self-pay | Admitting: Internal Medicine

## 2011-07-16 ENCOUNTER — Ambulatory Visit (INDEPENDENT_AMBULATORY_CARE_PROVIDER_SITE_OTHER)
Admission: RE | Admit: 2011-07-16 | Discharge: 2011-07-16 | Disposition: A | Discharge status: Home / Self Care | Payer: Medicare Other | Source: Ambulatory Visit | Attending: Internal Medicine | Admitting: Internal Medicine

## 2011-07-16 DIAGNOSIS — R1032 Left lower quadrant pain: Secondary | ICD-10-CM | POA: Diagnosis not present

## 2011-07-16 DIAGNOSIS — R109 Unspecified abdominal pain: Secondary | ICD-10-CM

## 2011-07-16 MED ORDER — IOHEXOL 300 MG/ML  SOLN
100.0000 mL | Freq: Once | INTRAMUSCULAR | Status: AC | PRN
  Administered 2011-07-16: 100 mL via INTRAVENOUS

## 2011-07-17 ENCOUNTER — Telehealth: Payer: Self-pay | Admitting: Internal Medicine

## 2011-07-17 NOTE — Telephone Encounter (Signed)
Notes Recorded by Nyoka Cowden, MD on 07/16/2011 at 10:12 PM Call patient : Study is unremarkable, no change in recs> needs eval by [facility] For ? Hernia? > ok to refer   lmtcb

## 2011-07-18 NOTE — Telephone Encounter (Signed)
Returning Leslies call

## 2011-07-18 NOTE — Telephone Encounter (Signed)
Notes Recorded by Nyoka Cowden, MD on 07/16/2011 at 10:12 PM Call patient : Study is unremarkable, no change in recs> needs eval by [facility] For ? Hernia? > ok to refer  Pt aware CT abdomen is unremarkable per MW and we are referring him to a surgeon for eval of possible hernia. Pt verbalized understanding. Order sent.

## 2011-08-05 ENCOUNTER — Encounter (INDEPENDENT_AMBULATORY_CARE_PROVIDER_SITE_OTHER): Payer: Self-pay | Admitting: Surgery

## 2011-08-07 ENCOUNTER — Ambulatory Visit (INDEPENDENT_AMBULATORY_CARE_PROVIDER_SITE_OTHER): Payer: BC Managed Care – PPO | Admitting: Surgery

## 2011-08-07 ENCOUNTER — Encounter (INDEPENDENT_AMBULATORY_CARE_PROVIDER_SITE_OTHER): Payer: Self-pay | Admitting: Surgery

## 2011-08-07 VITALS — BP 164/83 | HR 70 | Temp 97.2°F | Ht 70.5 in | Wt 191.2 lb

## 2011-08-07 DIAGNOSIS — R1032 Left lower quadrant pain: Secondary | ICD-10-CM | POA: Insufficient documentation

## 2011-08-07 NOTE — Progress Notes (Signed)
Patient ID: Isaac [name], male   DOB: June 09, 1940, 71 y.o.   MRN: [id number]  Chief Complaint  Patient presents with  . Pre-op Exam    eval ing hernia    HPI Isaac [name] is a 71 y.o. male.   HPI This is a pleasant gentleman referred by Dr. Sherene Sires for evaluation of right lower quadrant abdominal pain. Since February of this year he has been having superficial sharp pain in his right lower quadrant. This is occurring along an old inguinal hernia scar. He has had no obstructive symptoms. He has not noticed a bulge. He is otherwise without complaints. He reports the pain is moderate and will sometimes her into his scrotum. He hurts him with everyday Past Medical History  Diagnosis Date  . Ischemic heart disease   . Arthralgia   . Hypertension   . Hyperlipidemia   . Allergic rhinitis   . Health maintenance examination     Td 2/06; Pneumovax 12/06; CPX 07/04/10; [name]  . Ankle pain, left     Chronic, ? radiculopathy; [name]  . Arthritis   . GERD (gastroesophageal reflux disease)   . Heart attack     Past Surgical History  Procedure Date  . Coronary artery bypass graft 2002  . Hernia repair 1983  . Nose surgery 1985    Family History  Problem Relation Age of Onset  . Breast cancer Mother   . Diabetes Mother   . Cancer Mother     breast  . Hypertension Father   . Diabetes Sister   . COPD Brother     Social History History  Substance Use Topics  . Smoking status: Never Smoker   . Smokeless tobacco: Current User    Types: Chew  . Alcohol Use: No    No Known Allergies  Current Outpatient Prescriptions  Medication Sig Dispense Refill  . aspirin (ADULT ASPIRIN EC LOW STRENGTH) 81 MG EC tablet Take 81 mg by mouth daily.        [name] atenolol-chlorthalidone (TENORETIC) 50-25 MG per tablet Take 1 tablet by mouth daily.  90 tablet  4  . Cholecalciferol (VITAMIN D) 2000 UNITS tablet Take 2,000 Units by mouth daily.        . clotrimazole-betamethasone (LOTRISONE) cream APPLY TO  AFFECTED AREA TWICE DAILY  15 g  0  . doxazosin (CARDURA) 1 MG tablet Take 1 tablet (1 mg total) by mouth daily.  30 tablet  11  . mometasone (NASONEX) 50 MCG/ACT nasal spray Place 2 sprays into the nose 2 (two) times daily.        . Multiple Vitamins-Minerals (CENTRUM SILVER) tablet Take 1 tablet by mouth daily.        [name] omeprazole (PRILOSEC) 20 MG capsule Take 1 capsule (20 mg total) by mouth 2 (two) times daily.  180 capsule  4  . oxymetazoline (AFRIN) 0.05 % nasal spray Place into the nose as needed.        . simvastatin (ZOCOR) 20 MG tablet Take 1 tablet (20 mg total) by mouth at bedtime.  90 tablet  4    Review of Systems Review of Systems  Constitutional: Negative for fever, chills and unexpected weight change.  HENT: Negative for hearing loss, congestion, sore throat, trouble swallowing and voice change.   Eyes: Negative for visual disturbance.  Respiratory: Negative for cough and wheezing.   Cardiovascular: Negative for chest pain, palpitations and leg swelling.  Gastrointestinal: Positive for abdominal pain. Negative for nausea, vomiting, diarrhea, constipation,  blood in stool, abdominal distention, anal bleeding and rectal pain.  Genitourinary: Negative for hematuria and difficulty urinating.  Musculoskeletal: Negative for arthralgias.  Skin: Negative for rash and wound.  Neurological: Negative for seizures, syncope, weakness and headaches.  Hematological: Negative for adenopathy. Does not bruise/bleed easily.  Psychiatric/Behavioral: Negative for confusion.    Blood pressure 164/83, pulse 70, temperature 97.2 F (36.2 C), temperature source Temporal, height 5' 10.5" (1.791 m), weight 191 lb 3.2 oz (86.728 kg), SpO2 98.00%.  Physical Exam Physical Exam  Constitutional: He is oriented to person, place, and time. He appears well-developed and well-nourished.  HENT:  Head: Normocephalic and atraumatic.  Right Ear: External ear normal.  Left Ear: External ear normal.  Nose:  Nose normal.  Mouth/Throat: Oropharynx is clear and moist.  Eyes: Conjunctivae are normal. Pupils are equal, round, and reactive to light. No scleral icterus.  Neck: Normal range of motion. Neck supple. No tracheal deviation present. No thyromegaly present.  Cardiovascular: Normal rate, regular rhythm, normal heart sounds and intact distal pulses.   No murmur heard. Pulmonary/Chest: Effort normal and breath sounds normal. No respiratory distress. He has no wheezes. He has no rales.  Abdominal: Soft. Bowel sounds are normal. He exhibits no distension and no mass. There is no tenderness. There is no rebound.       Within both lying and standing and with vigorous Valsalva maneuvers I could not demonstrate a hernia defect in the left lower quadrant. He has a well-healed scar in the left lower quadrant. His greatest tenderness is along the internal ring  Musculoskeletal: Normal range of motion. He exhibits no edema and no tenderness.  Lymphadenopathy:    He has no cervical adenopathy.  Neurological: He is alert and oriented to person, place, and time.  Skin: Skin is warm and dry. No rash noted. No erythema.  Psychiatric: His behavior is normal.    Data Reviewed He has a CAT scan of the abdomen and pelvis which is not demonstrate a hernia or muscle hematoma. It is otherwise negative  Assessment    Right lower groin pain of uncertain etiology    Plan    Certainly this still may represent an early hernia. It may also represent a muscle strain. I discussed conservative management with anti-inflammatories and ice versus groin exploration. At this point, he wishes to proceed conservatively. I will add Ultram to his pain regimen. I will see him back in 4 weeks       Isaac [name] A 08/07/2011, 1:59 PM

## 2011-09-04 ENCOUNTER — Encounter (INDEPENDENT_AMBULATORY_CARE_PROVIDER_SITE_OTHER): Payer: Self-pay | Admitting: Surgery

## 2011-09-08 ENCOUNTER — Encounter (INDEPENDENT_AMBULATORY_CARE_PROVIDER_SITE_OTHER): Payer: Self-pay | Admitting: Surgery

## 2011-09-08 ENCOUNTER — Ambulatory Visit (INDEPENDENT_AMBULATORY_CARE_PROVIDER_SITE_OTHER): Payer: Medicare Other | Admitting: Surgery

## 2011-09-08 VITALS — BP 136/84 | HR 71 | Temp 97.4°F | Ht 70.5 in | Wt 187.4 lb

## 2011-09-08 DIAGNOSIS — R1032 Left lower quadrant pain: Secondary | ICD-10-CM | POA: Diagnosis not present

## 2011-09-08 NOTE — Progress Notes (Signed)
Subjective:     Patient ID: Isaac [name], male   DOB: 1940-05-19, 71 y.o.   MRN: [id number]  HPI He is still having occasional discomfort in his left groin. Some days she will hurt and some days he won't. He still has not noticed a bulge. He has had no obstructive symptoms. The discomfort does not keep him from his daily activities.  Review of Systems     Objective:   Physical Exam On exam, his area of tenderness is lateral and several inches above his old inguinal hernia scar in the left groin. There is a slight firm area on exam but no obvious fascial defect or reducible hernia    Assessment:     Left groin pain of uncertain etiology, negative CAT scan    Plan:     Again I offered continued conservative management for surgical exploration. As this is not lifestyle limiting, he was to follow this further which I believe is reasonable. I will see him back again in one month

## 2011-09-09 DIAGNOSIS — H2510 Age-related nuclear cataract, unspecified eye: Secondary | ICD-10-CM | POA: Diagnosis not present

## 2011-10-11 ENCOUNTER — Other Ambulatory Visit: Payer: Self-pay | Admitting: Internal Medicine

## 2011-10-13 ENCOUNTER — Ambulatory Visit (INDEPENDENT_AMBULATORY_CARE_PROVIDER_SITE_OTHER): Payer: Medicare Other | Admitting: Surgery

## 2011-10-13 ENCOUNTER — Encounter (INDEPENDENT_AMBULATORY_CARE_PROVIDER_SITE_OTHER): Payer: Self-pay | Admitting: Surgery

## 2011-10-13 VITALS — BP 133/74 | HR 70 | Temp 97.4°F | Ht 70.5 in | Wt 186.4 lb

## 2011-10-13 DIAGNOSIS — R1032 Left lower quadrant pain: Secondary | ICD-10-CM | POA: Diagnosis not present

## 2011-10-13 NOTE — Patient Instructions (Addendum)
Go by [facility] tomorrow and pick up contrast and instructions at [facility].  Your CT scan is Thursday 6/20th at 1pm.   You also need blood drawn at [facility] in the same building.

## 2011-10-13 NOTE — Progress Notes (Signed)
Subjective:     Patient ID: Isaac [name], male   DOB: May 30, 1940, 71 y.o.   MRN: [id number]  HPI He was originally referred to me in May for evaluation of a possible recurrent left inguinal hernia. He had been having intermittent left groin pain. He now reports that since Friday as a continuous pain and dark stools. The pain is moderate in intensity and refers across his lower abdomen. Again the focal point of the pain is a left lower quadrant  Review of Systems     Objective:   Physical Exam On exam, he has tenderness with guarding in the left lower quadrant. Again, within both standing and lying I cannot palpate an inguinal hernia    Assessment:     Left lower quadrant abdominal pain of uncertain etiology    Plan:     This may represent diverticulitis. Again I do not feel like this is a hernia especially given his symptoms and examination. I will need to repeat his CAT scan of the abdomen and pelvis to see if this is diverticulitis. If the CAT scan is negative, I will need to refer him to gastroenterology. His last colonoscopy was over 5 years ago

## 2011-10-14 LAB — BUN: BUN: 23 mg/dL (ref 6–23)

## 2011-10-16 ENCOUNTER — Ambulatory Visit
Admission: RE | Admit: 2011-10-16 | Discharge: 2011-10-16 | Disposition: A | Discharge status: Home / Self Care | Payer: BC Managed Care – PPO | Source: Ambulatory Visit | Attending: Surgery | Admitting: Surgery

## 2011-10-16 DIAGNOSIS — K449 Diaphragmatic hernia without obstruction or gangrene: Secondary | ICD-10-CM | POA: Diagnosis not present

## 2011-10-16 DIAGNOSIS — R1032 Left lower quadrant pain: Secondary | ICD-10-CM

## 2011-10-16 MED ORDER — IOHEXOL 300 MG/ML  SOLN
100.0000 mL | Freq: Once | INTRAMUSCULAR | Status: AC | PRN
  Administered 2011-10-16: 100 mL via INTRAVENOUS

## 2011-10-22 ENCOUNTER — Encounter (HOSPITAL_COMMUNITY): Payer: Self-pay | Admitting: Emergency Medicine

## 2011-10-22 ENCOUNTER — Emergency Department (HOSPITAL_COMMUNITY)
Admission: EM | Admit: 2011-10-22 | Discharge: 2011-10-23 | Disposition: A | Discharge status: Home / Self Care | Payer: Medicare Other | Attending: Emergency Medicine | Admitting: Emergency Medicine

## 2011-10-22 DIAGNOSIS — K219 Gastro-esophageal reflux disease without esophagitis: Secondary | ICD-10-CM | POA: Diagnosis not present

## 2011-10-22 DIAGNOSIS — Z7982 Long term (current) use of aspirin: Secondary | ICD-10-CM | POA: Insufficient documentation

## 2011-10-22 DIAGNOSIS — I259 Chronic ischemic heart disease, unspecified: Secondary | ICD-10-CM | POA: Diagnosis not present

## 2011-10-22 DIAGNOSIS — I10 Essential (primary) hypertension: Secondary | ICD-10-CM | POA: Diagnosis not present

## 2011-10-22 DIAGNOSIS — R1032 Left lower quadrant pain: Secondary | ICD-10-CM | POA: Diagnosis not present

## 2011-10-22 DIAGNOSIS — K449 Diaphragmatic hernia without obstruction or gangrene: Secondary | ICD-10-CM | POA: Diagnosis not present

## 2011-10-22 DIAGNOSIS — R109 Unspecified abdominal pain: Secondary | ICD-10-CM | POA: Insufficient documentation

## 2011-10-22 DIAGNOSIS — Z79899 Other long term (current) drug therapy: Secondary | ICD-10-CM | POA: Diagnosis not present

## 2011-10-22 DIAGNOSIS — Z8739 Personal history of other diseases of the musculoskeletal system and connective tissue: Secondary | ICD-10-CM | POA: Insufficient documentation

## 2011-10-22 DIAGNOSIS — R112 Nausea with vomiting, unspecified: Secondary | ICD-10-CM

## 2011-10-22 DIAGNOSIS — Z951 Presence of aortocoronary bypass graft: Secondary | ICD-10-CM | POA: Insufficient documentation

## 2011-10-22 NOTE — ED Notes (Signed)
Pt alert, arrives from home, c/o lower abd pain, emesis, onset this evening, per pt, seen several MD past several weeks, no results, attended church this evening, emesis and pain, last BM this evening, resp even unlabored, skin pwd

## 2011-10-23 ENCOUNTER — Emergency Department (HOSPITAL_COMMUNITY): Payer: Medicare Other

## 2011-10-23 DIAGNOSIS — R109 Unspecified abdominal pain: Secondary | ICD-10-CM | POA: Diagnosis not present

## 2011-10-23 DIAGNOSIS — R112 Nausea with vomiting, unspecified: Secondary | ICD-10-CM | POA: Diagnosis not present

## 2011-10-23 DIAGNOSIS — K449 Diaphragmatic hernia without obstruction or gangrene: Secondary | ICD-10-CM | POA: Diagnosis not present

## 2011-10-23 LAB — CBC WITH DIFFERENTIAL/PLATELET
Basophils Relative: 0 % (ref 0–1)
Eosinophils Absolute: 0.1 10*3/uL (ref 0.0–0.7)
Eosinophils Relative: 1 % (ref 0–5)
Hemoglobin: 13.9 g/dL (ref 13.0–17.0)
MCH: 31.2 pg (ref 26.0–34.0)
MCHC: 33.6 g/dL (ref 30.0–36.0)
MCV: 93 fL (ref 78.0–100.0)
Monocytes Absolute: 0.9 10*3/uL (ref 0.1–1.0)
Monocytes Relative: 8 % (ref 3–12)
Neutrophils Relative %: 84 % — ABNORMAL HIGH (ref 43–77)

## 2011-10-23 LAB — URINALYSIS, ROUTINE W REFLEX MICROSCOPIC
Leukocytes, UA: NEGATIVE
Nitrite: NEGATIVE
Specific Gravity, Urine: 1.024 (ref 1.005–1.030)
Urobilinogen, UA: 0.2 mg/dL (ref 0.0–1.0)
pH: 8.5 — ABNORMAL HIGH (ref 5.0–8.0)

## 2011-10-23 LAB — COMPREHENSIVE METABOLIC PANEL
Albumin: 4 g/dL (ref 3.5–5.2)
BUN: 22 mg/dL (ref 6–23)
Calcium: 9 mg/dL (ref 8.4–10.5)
Creatinine, Ser: 1.38 mg/dL — ABNORMAL HIGH (ref 0.50–1.35)
GFR calc Af Amer: 58 mL/min — ABNORMAL LOW (ref >90)
Potassium: 3.3 mEq/L — ABNORMAL LOW (ref 3.5–5.1)
Total Protein: 7.3 g/dL (ref 6.0–8.3)

## 2011-10-23 LAB — LIPASE, BLOOD: Lipase: 60 U/L — ABNORMAL HIGH (ref 11–59)

## 2011-10-23 MED ORDER — ONDANSETRON HCL 4 MG/2ML IJ SOLN
4.0000 mg | Freq: Once | INTRAMUSCULAR | Status: AC
  Administered 2011-10-23: 4 mg via INTRAVENOUS
  Filled 2011-10-23: qty 2

## 2011-10-23 MED ORDER — MORPHINE SULFATE 4 MG/ML IJ SOLN
4.0000 mg | Freq: Once | INTRAMUSCULAR | Status: AC
  Administered 2011-10-23: 4 mg via INTRAVENOUS
  Filled 2011-10-23: qty 1

## 2011-10-23 MED ORDER — ONDANSETRON 8 MG PO TBDP: ORAL_TABLET | ORAL | Status: AC

## 2011-10-23 MED ORDER — SODIUM CHLORIDE 0.9 % IV BOLUS (SEPSIS)
500.0000 mL | Freq: Once | INTRAVENOUS | Status: AC
  Administered 2011-10-23: 500 mL via INTRAVENOUS

## 2011-10-23 NOTE — ED Provider Notes (Deleted)
Medical screening examination/treatment/procedure(s) were performed by non-physician practitioner and as supervising physician I was immediately available for consultation/collaboration.   Hanley Seamen, MD 10/23/11 2252

## 2011-10-23 NOTE — Discharge Instructions (Signed)
You were seen and evaluated for your continued left lower abdominal pains with nausea and vomiting. Your lab tests today and x-ray have not shown any new concerning findings to explain your symptoms. Your providers giving you prescriptions for medications to help with your nausea vomiting please call your primary care provider for close followup and reevaluation of your symptoms. Return to emergency room for any worsening pain, fever, chills or persistent nausea vomiting and are unable to take your medicine.   Abdominal Pain (Nonspecific) Your exam might not show the exact reason you have abdominal pain. Since there are many different causes of abdominal pain, another checkup and more tests may be needed. It is very important to follow up for lasting (persistent) or worsening symptoms. A possible cause of abdominal pain in any person who still has his or her appendix is acute appendicitis. Appendicitis is often hard to diagnose. Normal blood tests, urine tests, ultrasound, and CT scans do not completely rule out early appendicitis or other causes of abdominal pain. Sometimes, only the changes that happen over time will allow appendicitis and other causes of abdominal pain to be determined. Other potential problems that may require surgery may also take time to become more apparent. Because of this, it is important that you follow all of the instructions below. HOME CARE INSTRUCTIONS   Rest as much as possible.   Do not eat solid food until your pain is gone.   While adults or children have pain: A diet of water, weak decaffeinated tea, broth or bouillon, gelatin, oral rehydration solutions (ORS), frozen ice pops, or ice chips may be helpful.   When pain is gone in adults or children: Start a light diet (dry toast, crackers, applesauce, or white rice). Increase the diet slowly as long as it does not bother you. Eat no dairy products (including cheese and eggs) and no spicy, fatty, fried, or high-fiber  foods.   Use no alcohol, caffeine, or cigarettes.   Take your regular medicines unless your caregiver told you not to.   Take any prescribed medicine as directed.   Only take over-the-counter or prescription medicines for pain, discomfort, or fever as directed by your caregiver. Do not give aspirin to children.  If your caregiver has given you a follow-up appointment, it is very important to keep that appointment. Not keeping the appointment could result in a permanent injury and/or lasting (chronic) pain and/or disability. If there is any problem keeping the appointment, you must call to reschedule.  SEEK IMMEDIATE MEDICAL CARE IF:   Your pain is not gone in 24 hours.   Your pain becomes worse, changes location, or feels different.   You or your child has an oral temperature above 102 F (38.9 C), not controlled by medicine.   Your baby is older than 3 months with a rectal temperature of 102 F (38.9 C) or higher.   Your baby is 56 months old or younger with a rectal temperature of 100.4 F (38 C) or higher.   You have shaking chills.   You keep throwing up (vomiting) or cannot drink liquids.   There is blood in your vomit or you see blood in your bowel movements.   Your bowel movements become dark or black.   You have frequent bowel movements.   Your bowel movements stop (become blocked) or you cannot pass gas.   You have bloody, frequent, or painful urination.   You have yellow discoloration in the skin or whites of the eyes.  Your stomach becomes bloated or bigger.   You have dizziness or fainting.   You have chest or back pain.  MAKE SURE YOU:   Understand these instructions.   Will watch your condition.   Will get help right away if you are not doing well or get worse.  Document Released: 04/14/2005 Document Revised: 04/03/2011 Document Reviewed: 03/12/2009 [facility] Patient Information 2012 [place], [place].     Nausea and Vomiting Nausea is a sick  feeling that often comes before throwing up (vomiting). Vomiting is a reflex where stomach contents come out of your mouth. Vomiting can cause severe loss of body fluids (dehydration). Children and elderly adults can become dehydrated quickly, especially if they also have diarrhea. Nausea and vomiting are symptoms of a condition or disease. It is important to find the cause of your symptoms. CAUSES   Direct irritation of the stomach lining. This irritation can result from increased acid production (gastroesophageal reflux disease), infection, food poisoning, taking certain medicines (such as nonsteroidal anti-inflammatory drugs), alcohol use, or tobacco use.   Signals from the brain.These signals could be caused by a headache, heat exposure, an inner ear disturbance, increased pressure in the brain from injury, infection, a tumor, or a concussion, pain, emotional stimulus, or metabolic problems.   An obstruction in the gastrointestinal tract (bowel obstruction).   Illnesses such as diabetes, hepatitis, gallbladder problems, appendicitis, kidney problems, cancer, sepsis, atypical symptoms of a heart attack, or eating disorders.   Medical treatments such as chemotherapy and radiation.   Receiving medicine that makes you sleep (general anesthetic) during surgery.  DIAGNOSIS Your caregiver may ask for tests to be done if the problems do not improve after a few days. Tests may also be done if symptoms are severe or if the reason for the nausea and vomiting is not clear. Tests may include:  Urine tests.   Blood tests.   Stool tests.   Cultures (to look for evidence of infection).   X-rays or other imaging studies.  Test results can help your caregiver make decisions about treatment or the need for additional tests. TREATMENT You need to stay well hydrated. Drink frequently but in small amounts.You may wish to drink water, sports drinks, clear broth, or eat frozen ice pops or gelatin dessert  to help stay hydrated.When you eat, eating slowly may help prevent nausea.There are also some antinausea medicines that may help prevent nausea. HOME CARE INSTRUCTIONS   Take all medicine as directed by your caregiver.   If you do not have an appetite, do not force yourself to eat. However, you must continue to drink fluids.   If you have an appetite, eat a normal diet unless your caregiver tells you differently.   Eat a variety of complex carbohydrates (rice, wheat, potatoes, bread), lean meats, yogurt, fruits, and vegetables.   Avoid high-fat foods because they are more difficult to digest.   Drink enough water and fluids to keep your urine clear or pale yellow.   If you are dehydrated, ask your caregiver for specific rehydration instructions. Signs of dehydration may include:   Severe thirst.   Dry lips and mouth.   Dizziness.   Dark urine.   Decreasing urine frequency and amount.   Confusion.   Rapid breathing or pulse.  SEEK IMMEDIATE MEDICAL CARE IF:   You have blood or brown flecks (like coffee grounds) in your vomit.   You have black or bloody stools.   You have a severe headache or stiff neck.  You are confused.   You have severe abdominal pain.   You have chest pain or trouble breathing.   You do not urinate at least once every 8 hours.   You develop cold or clammy skin.   You continue to vomit for longer than 24 to 48 hours.   You have a fever.  MAKE SURE YOU:   Understand these instructions.   Will watch your condition.   Will get help right away if you are not doing well or get worse.  Document Released: 04/14/2005 Document Revised: 04/03/2011 Document Reviewed: 09/11/2010 [facility] Patient Information 2012 [place], [place].

## 2011-10-23 NOTE — ED Provider Notes (Signed)
History     CSN: [id number]  Arrival date & time 10/22/11  2246   First MD Initiated Contact with Patient 10/23/11 0047      Chief Complaint  Patient presents with  . Abdominal pain and vomiting    HPI  History provided by the patient and wife. She has a 71 year old male with history of hypertension, hyperlipidemia, coronary artery disease status post CABG who presents with complaints of worsening left lower quadrant abdominal pain with nausea vomiting symptoms. Patient states that he has had some waxing waning left lower quadrant pains for the past few months. Patient has been seen by PCP for this as well as general surgeon with a recent CAT scan last Thursday. This time patient states that her sugar was causing his symptoms. Tonight patient returned from church around 8 PM develop worsening pain and symptoms. Tonight symptoms were associated with nausea and vomiting and general poor feeling. Patient denies any aggravating or alleviating factors. Pain is constant. He does not radiate. Patient denies any associated fever, chills, diarrhea, constipation, dysuria, hematuria, urinary frequency or flank pain.    Past Medical History  Diagnosis Date  . Ischemic heart disease   . Arthralgia   . Hypertension   . Hyperlipidemia   . Allergic rhinitis   . Health maintenance examination     Td 2/06; Pneumovax 12/06; CPX 07/04/10; [name]  . Ankle pain, left     Chronic, ? radiculopathy; [name]  . Arthritis   . GERD (gastroesophageal reflux disease)   . Heart attack   . Left groin pain 08/07/2011    Past Surgical History  Procedure Date  . Coronary artery bypass graft 2002  . Hernia repair 1983  . Nose surgery 1985    Family History  Problem Relation Age of Onset  . Breast cancer Mother   . Diabetes Mother   . Cancer Mother     breast  . Hypertension Father   . Diabetes Sister   . COPD Brother     History  Substance Use Topics  . Smoking status: Never Smoker   . Smokeless tobacco:  Current User    Types: Chew  . Alcohol Use: No      Review of Systems  Constitutional: Negative for fever and chills.  Respiratory: Negative for shortness of breath.   Cardiovascular: Negative for chest pain.  Gastrointestinal: Positive for nausea, vomiting and abdominal pain. Negative for diarrhea, constipation and blood in stool.  Genitourinary: Negative for dysuria, frequency, hematuria and flank pain.  Skin: Negative for rash.    Allergies  Review of patient's allergies indicates no known allergies.  Home Medications   Current Outpatient Rx  Name Route Sig Dispense Refill  . ASPIRIN 81 MG PO TBEC Oral Take 81 mg by mouth daily.      . ATENOLOL-CHLORTHALIDONE 50-25 MG PO TABS  TAKE 1 TABLET DAILY 90 tablet 3  . VITAMIN D 2000 UNITS PO TABS Oral Take 2,000 Units by mouth daily.      [name] CLOTRIMAZOLE-BETAMETHASONE 1-0.05 % EX CREA  APPLY TO AFFECTED AREA TWICE DAILY 15 g 0  . DOXAZOSIN MESYLATE 1 MG PO TABS Oral Take 1 tablet (1 mg total) by mouth daily. 30 tablet 11  . MOMETASONE FUROATE 50 MCG/ACT NA SUSP Nasal Place 2 sprays into the nose 2 (two) times daily.      . ADULT MULTIVITAMIN W/MINERALS CH Oral Take 1 tablet by mouth daily.    [name] OMEPRAZOLE 20 MG PO CPDR  TAKE  1 CAPSULE TWICE DAILY 180 capsule 3  . OXYMETAZOLINE HCL 0.05 % NA SOLN Nasal Place 2 sprays into the nose as needed. Dryness    . SIMVASTATIN 20 MG PO TABS Oral Take 20 mg by mouth at bedtime.    . TRAMADOL HCL 50 MG PO TABS Oral Take 50 mg by mouth every 4 (four) hours as needed. Pain      BP 155/74  Pulse 76  Temp 97.6 F (36.4 C) (Oral)  Resp 15  SpO2 100%  Physical Exam  Nursing note and vitals reviewed. Constitutional: He is oriented to person, place, and time. He appears well-developed and well-nourished. No distress.  HENT:  Head: Normocephalic.  Cardiovascular: Normal rate and regular rhythm.   Pulmonary/Chest: Effort normal and breath sounds normal. No respiratory distress. He has no wheezes.  He has no rales.  Abdominal: Soft. He exhibits no distension. There is no hepatosplenomegaly. There is tenderness in the left lower quadrant. There is no rebound, no guarding and no CVA tenderness. Hernia confirmed negative in the right inguinal area and confirmed negative in the left inguinal area.  Genitourinary: Penis normal.  Lymphadenopathy:       Right: No inguinal adenopathy present.       Left: No inguinal adenopathy present.  Neurological: He is alert and oriented to person, place, and time.  Skin: Skin is warm and dry. No rash noted. No erythema.  Psychiatric: He has a normal mood and affect. His behavior is normal.    ED Course  Procedures   Results for orders placed during the hospital encounter of 10/22/11  CBC WITH DIFFERENTIAL      Component Value Range   WBC 11.1 (*) 4.0 - 10.5 K/uL   RBC 4.45  4.22 - 5.81 MIL/uL   Hemoglobin 13.9  13.0 - 17.0 g/dL   HCT 78.4  69.6 - 29.5 %   MCV 93.0  78.0 - 100.0 fL   MCH 31.2  26.0 - 34.0 pg   MCHC 33.6  30.0 - 36.0 g/dL   RDW 28.4  13.2 - 44.0 %   Platelets 132 (*) 150 - 400 K/uL   Neutrophils Relative 84 (*) 43 - 77 %   Neutro Abs 9.3 (*) 1.7 - 7.7 K/uL   Lymphocytes Relative 8 (*) 12 - 46 %   Lymphs Abs 0.9  0.7 - 4.0 K/uL   Monocytes Relative 8  3 - 12 %   Monocytes Absolute 0.9  0.1 - 1.0 K/uL   Eosinophils Relative 1  0 - 5 %   Eosinophils Absolute 0.1  0.0 - 0.7 K/uL   Basophils Relative 0  0 - 1 %   Basophils Absolute 0.0  0.0 - 0.1 K/uL  COMPREHENSIVE METABOLIC PANEL      Component Value Range   Sodium 141  135 - 145 mEq/L   Potassium 3.3 (*) 3.5 - 5.1 mEq/L   Chloride 100  96 - 112 mEq/L   CO2 29  19 - 32 mEq/L   Glucose, Bld 119 (*) 70 - 99 mg/dL   BUN 22  6 - 23 mg/dL   Creatinine, Ser 1.02 (*) 0.50 - 1.35 mg/dL   Calcium 9.0  8.4 - 72.5 mg/dL   Total Protein 7.3  6.0 - 8.3 g/dL   Albumin 4.0  3.5 - 5.2 g/dL   AST 23  0 - 37 U/L   ALT 19  0 - 53 U/L   Alkaline Phosphatase 69  39 - 117  U/L   Total  Bilirubin 0.6  0.3 - 1.2 mg/dL   GFR calc non Af Amer 50 (*) >90 mL/min   GFR calc Af Amer 58 (*) >90 mL/min  LIPASE, BLOOD      Component Value Range   Lipase 60 (*) 11 - 59 U/L  URINALYSIS, ROUTINE W REFLEX MICROSCOPIC      Component Value Range   Color, Urine YELLOW  YELLOW   APPearance CLEAR  CLEAR   Specific Gravity, Urine 1.024  1.005 - 1.030   pH 8.5 (*) 5.0 - 8.0   Glucose, UA NEGATIVE  NEGATIVE mg/dL   Hgb urine dipstick NEGATIVE  NEGATIVE   Bilirubin Urine NEGATIVE  NEGATIVE   Ketones, ur NEGATIVE  NEGATIVE mg/dL   Protein, ur 914 (*) NEGATIVE mg/dL   Urobilinogen, UA 0.2  0.0 - 1.0 mg/dL   Nitrite NEGATIVE  NEGATIVE   Leukocytes, UA NEGATIVE  NEGATIVE  URINE MICROSCOPIC-ADD ON      Component Value Range   Urine-Other       Value: NO FORMED ELEMENTS SEEN ON URINE MICROSCOPIC EXAMINATION      Dg Abd Acute W/chest  10/23/2011  *RADIOLOGY REPORT*  Clinical Data: Left-sided abdominal pain, vomiting and nausea. Tightness in the chest.  ACUTE ABDOMEN SERIES (ABDOMEN 2 VIEW & CHEST 1 VIEW)  Comparison: CT abdomen and pelvis 10/16/2011.  Chest 06/30/2011  Findings: Stable appearance of postoperative changes in the mediastinum.  Borderline heart size with normal pulmonary vascularity.  Slight fibrosis in the lung bases.  No focal airspace consolidation.  No blunting of costophrenic angles.  No pneumothorax.  No significant change since previous chest film.  Scattered gas and stool in the colon.  Esophageal hiatal hernia behind the heart.  No small or large bowel distension.  No free intra-abdominal air.  No abnormal air fluid levels.  Calcifications in the pelvis likely represent phleboliths.  No radiopaque stones.  IMPRESSION: No evidence of active pulmonary disease.  Small esophageal hiatal hernia.  Nonobstructive bowel gas pattern.  Original Report Authenticated By: Marlon Pel, M.D.     1. Abdominal pain   2. Nausea & vomiting       MDM  Patient seen and evaluated.  Patient in no acute distress.  Patient has had some ongoing issues of left lower quadrant pains. He's been evaluated by PCP Dr. Sherene Sires. Patient was referred to Dr. Magnus Ivan with general surgery for same symptoms. Patient has CT scan this past Thursday did not show any acute or concerning findings to explain symptoms. No hernias noted on CT.  Patient was seen and discussed with attending physician. Will obtain acute abdominal film to rule out any concerning obstruction. Patient had normal CT last week has no other concerning findings on lab tests or battle signs.  X-ray unremarkable. Patient continues to feel well after medications we'll discharge home with symptomatic treatments.      Angus Seller, [place] 10/23/11 [phone]

## 2011-10-23 NOTE — ED Provider Notes (Signed)
Medical screening examination/treatment/procedure(s) were conducted as a shared visit with non-physician practitioner(s) and myself.  I personally evaluated the patient during the encounter  Abdomen soft, lower abdominal tenderness without distention.  Hanley Seamen, MD 10/23/11 0401

## 2011-10-24 ENCOUNTER — Encounter: Payer: Self-pay | Admitting: Adult Health

## 2011-10-24 ENCOUNTER — Ambulatory Visit (INDEPENDENT_AMBULATORY_CARE_PROVIDER_SITE_OTHER): Payer: BC Managed Care – PPO | Admitting: Adult Health

## 2011-10-24 ENCOUNTER — Telehealth: Payer: Self-pay | Admitting: Gastroenterology

## 2011-10-24 VITALS — BP 124/64 | HR 65 | Temp 98.4°F | Ht 70.5 in | Wt 186.8 lb

## 2011-10-24 DIAGNOSIS — R109 Unspecified abdominal pain: Secondary | ICD-10-CM | POA: Diagnosis not present

## 2011-10-24 NOTE — Assessment & Plan Note (Signed)
Persistent LLQ ABD pain ? Etiology begin ~06/2011  Workup thus far unrevealing  W/ labs and CT abd/pelvis x 2  Surgical consult  Will need to refer to GI for evaluation   Plan;  Will treat for possible IBS symptomology although doubt  GERD diet  Levsin 0.125mg  Three times a day  As needed  Abdominal pain /spasm.  Gas X with meals  We are referring you to GI to evaluate persistent abdominal pain  follow up Dr. Sherene Sires  In 2 months and As needed

## 2011-10-24 NOTE — Telephone Encounter (Signed)
Pt scheduled to see Willette Cluster NP 10/27/11@2pm . Rhonda to notify pt of appt date and time.

## 2011-10-24 NOTE — Patient Instructions (Addendum)
GERD diet  Levsin 0.125mg  Three times a day  As needed  Abdominal pain /spasm.  Gas X with meals  We are referring you to GI to evaluate persistent abdominal pain  follow up Dr. Sherene Sires  In 2 months and As needed

## 2011-10-24 NOTE — Progress Notes (Signed)
Subjective:    Patient ID: IVAAN LIDDY, male    DOB: 1940-11-24   MRN: [id number]  HPI  50 yowm with h/o IHD/HBP ? radicular Left leg/foot pain followed by [name].    06/30/2011 f/u ov/[name] cc mostly dry cough x one month then new onset LLQ abd pain daily not present sleeping but daily waxes and wanes sev hours at a time no obvious trigger with activity or meals/flatus/ bms rec Prednisone 10 mg take  4 each am x 2 days,   2 each am x 2 days,  1 each am x2days and stop For cough use delsym cough syrup and stop fish oil until no longer coughing at all Try prilosec 20mg   Take 30-60 min before first meal of the day and Pepcid 20 mg one bedtime .  GERD  07/14/2011 f/u ov/[name] cough gone but  cc persistent x one month LLQ pain  Rad across ant abd to naval area plus difficulty with urination x 5 days  But no flank pain, hematuria, dysuria or use of otcs/antihistamine.  Abd pain waxes and wanes worse for maybe an hour then better but always present a little and not worse coughing. No pattern of pain in terms of alleviating or exacerbating.  >>CT abd pelvis, referral to surgeon.   10/24/2011 ER follow up  Over last 3 months has had constant LLQ abdominal pain.  Initially CT abd/pelvis done w/ question of return of L sided inguinal hernia -previous hernia repair 30 years ago. Referred to surgeon. Pt was given tramadol for pain . Did not feel this pain was related to hernia .  CT repeated 6/20 essentially unremarkable w/ no sign of diverticulitis, hernia . Small hiatal hernia/?esophagitis. Probable fat necrosis in LU abd ? Significance.  Developed n/v which was new 2 days ago , seen in ER w/ essentially neg workup  N/v resolved.  Cont w/ Dull ache that is constant. In LLQ -also tender. Pain radiates down into left groin.  No testicle swelling or pain. No urinary symptoms . UA has been unremarkable.  Taking tramadol which helps.  No urinary symptoms , no bloody stools.  No constipation w/ daily bm. No  fever  Gas bloating most days . No GERD symptoms on prilosec.  Pain is better with lying down . No association with food intake.    Past Medical History:  ISCHEMIC HEART DISEASE (ICD-414.9)  ARTHRALGIA (ICD-719.40)  HYPERTENSION (ICD-401.9)  HYPERLIPIDEMIA (ICD-272.4)  ALLERGIC RHINITIS (ICD-477.9)  L IH repair 1984 GERD/ Barrett's............................................................................[name] [facility] MAINTENANCE.................................................................Marland KitchenWert  -Td 05/2004 -Pneumovax 03/2005  -CPX July 04 2010 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.........................................Marland KitchenOlin     ROS:  Constitutional:   No  weight loss, night sweats,  Fevers, chills, fatigue, or  lassitude.  HEENT:   No headaches,  Difficulty swallowing,  Tooth/dental problems, or  Sore throat,                No sneezing, itching, ear ache, nasal congestion, post nasal drip,   CV:  No chest pain,  Orthopnea, PND, swelling in lower extremities, anasarca, dizziness, palpitations, syncope.   GI  No heartburn, indigestion,   nausea, vomiting, diarrhea, change in bowel habits, loss of appetite, bloody stools.   Resp: No shortness of breath with exertion or at rest.  No excess mucus, no productive cough,  No non-productive cough,  No coughing up of blood.  No change in color of mucus.  No wheezing.  No chest wall deformity  Skin: no rash  or lesions.  GU: no dysuria, change in color of urine, no urgency or frequency.  No flank pain, no hematuria   MS:  No joint pain or swelling.  No decreased range of motion.  No back pain.  Psych:  No change in mood or affect. No depression or anxiety.  No memory loss.          Objective:   Physical Exam      Ambulatory healthy appearing in no acute distress.  wt 198 > 189 > 192 April 09, 2009 > 190 July 21, 2009 > 189 06/30/11 07/14/2011 >186 10/24/2011  HEENT: Edentulous, nl turbinates, and orophanx. Ext  canals ok Neck without JVD/Nodes/TM  Lungs clear to A and P bilaterally without cough on insp or exp maneuvers  RRR no s3 or murmur or increase in P2  Abd soft , BS+ x 4 Q. No bruits or organomegaly  LLQ tenderness, no rebound. Groin without palpable masses. No bruits.  EXT no edema, from left ankle, nl pulses  MS nl gait, no restrictions or deformities Neuro intact sensorium, no motor deficits Skin   Few seb Keratoses    06/30/11 cxr No active cardiopulmonary disease.     Assessment & Plan:

## 2011-10-27 ENCOUNTER — Encounter: Payer: Self-pay | Admitting: Nurse Practitioner

## 2011-10-27 ENCOUNTER — Ambulatory Visit (INDEPENDENT_AMBULATORY_CARE_PROVIDER_SITE_OTHER): Payer: BC Managed Care – PPO | Admitting: Nurse Practitioner

## 2011-10-27 VITALS — BP 104/60 | HR 72 | Ht 70.5 in | Wt 183.2 lb

## 2011-10-27 DIAGNOSIS — K2270 Barrett's esophagus without dysplasia: Secondary | ICD-10-CM | POA: Insufficient documentation

## 2011-10-27 DIAGNOSIS — R1032 Left lower quadrant pain: Secondary | ICD-10-CM

## 2011-10-27 DIAGNOSIS — R109 Unspecified abdominal pain: Secondary | ICD-10-CM | POA: Diagnosis not present

## 2011-10-27 HISTORY — DX: Barrett's esophagus without dysplasia: K22.70

## 2011-10-27 MED ORDER — MOVIPREP 100 G PO SOLR: ORAL | Status: DC

## 2011-10-27 NOTE — Progress Notes (Addendum)
10/27/2011 Elwyn Lade [id number] [birth year]   HISTORY OF PRESENT ILLNESS: Isaac [name] is a 71 y.o. male with multiple medical problems. He has undergone screening colonoscopies with Dr. Arlyce Dice in the past.. Last colonoscopy November 2007 was normal except for sigmoid diverticulosis.  Patient referred by [facility] pulmonary for evaluation of abdominal pain. Patient saw Dr. Sherene Sires in March of this year at which time he described  a one-month history of left lower quadrant pain. He was also having some urinary symptoms at that time. Patient was prescribed Cardura. CT scan of the abdomen and pelvis revealed a questionable area of fat necrosis LUQ omentum. No other findings but pain persisted and patient was referred to surgery to see whether patient may have a hernia causing the pain. (patient had a left inguinal hernia repaired over 20 years ago).  Dr. Magnus Ivan saw patient in April 2013, Groin exploration discussed but patient was instead treated conservatively with anti-inflammatories and ice. Patient saw the surgeon again in mid May with ongoing LLQ pain. Surgical exploration again discussed but a repeat CT scan of the abdomen and pelvis was unrevealing and surgery recommended GI evaluation.  Last Wednesday patient went to ED with the LLQ pain as well as new onset vomiting. CBC revealed a minimally elevated WBC of 11.1, normal hemoglobin, platelets 132. His lipase was okay at 60, urinalysis unremarkable. Abdominal series was unremarkable. Patient was discharged home from ED. No vomiting since. His constant LLQ pain is ongoing. Bowel movements are normal. Patient does complain of frequent urination, small volume. Cardura hasn't helped. He also complains of testicles being sore intermittently over the last few weeks.   Past Medical History  Diagnosis Date  . Ischemic heart disease   . Arthralgia   . Hypertension   . Hyperlipidemia   . Allergic rhinitis   . Health maintenance examination     Td 2/06;  Pneumovax 12/06; CPX 07/04/10; [name]  . Ankle pain, left     Chronic, ? radiculopathy; [name]  . Arthritis   . GERD (gastroesophageal reflux disease)   . Heart attack   . Left groin pain 08/07/2011  Barrett's esophagus Past Surgical History  Procedure Date  . Coronary artery bypass graft 2002  . Inguinal hernia repair 1983    left  . Nose surgery 1985    reports that he has never smoked. His smokeless tobacco use includes Chew. He reports that he does not drink alcohol or use illicit drugs. family history includes Breast cancer in his mother; COPD in his brother; Dementia in his father; Diabetes in his mother and sister; and Hypertension in his father. No Known Allergies    Outpatient Encounter Prescriptions as of 10/27/2011  Medication Sig Dispense Refill  . aspirin (ADULT ASPIRIN EC LOW STRENGTH) 81 MG EC tablet Take 81 mg by mouth daily.        [name] atenolol-chlorthalidone (TENORETIC) 50-25 MG per tablet TAKE 1 TABLET DAILY  90 tablet  3  . Cholecalciferol (VITAMIN D) 2000 UNITS tablet Take 2,000 Units by mouth daily.        . clotrimazole-betamethasone (LOTRISONE) cream APPLY TO AFFECTED AREA TWICE DAILY  15 g  0  . doxazosin (CARDURA) 1 MG tablet Take 1 tablet (1 mg total) by mouth daily.  30 tablet  11  . mometasone (NASONEX) 50 MCG/ACT nasal spray Place 2 sprays into the nose 2 (two) times daily.        . Multiple Vitamin (MULTIVITAMIN WITH MINERALS) TABS Take 1 tablet by  mouth daily.      [name] omeprazole (PRILOSEC) 20 MG capsule TAKE 1 CAPSULE TWICE DAILY  180 capsule  3  . ondansetron (ZOFRAN ODT) 8 MG disintegrating tablet 8mg  ODT q4 hours prn nausea  20 tablet  0  . oxymetazoline (AFRIN) 0.05 % nasal spray Place 2 sprays into the nose as needed. Dryness      . simvastatin (ZOCOR) 20 MG tablet Take 20 mg by mouth at bedtime.      . traMADol (ULTRAM) 50 MG tablet Take 50 mg by mouth every 4 (four) hours as needed. Pain        REVIEW OF SYSTEMS  : Positive for 3 pound loss since Friday.  All other systems reviewed and negative except where noted in the History of Present Illness.   PHYSICAL EXAM: BP 104/60  Pulse 72  Ht 5' 10.5" (1.791 m)  Wt 183 lb 4 oz (83.122 kg)  BMI 25.92 kg/m2 General: Well developed white male in no acute distress Head: Normocephalic and atraumatic Eyes:  sclerae anicteric,conjunctive pink. Ears: Normal auditory acuity Mouth: No deformity or lesions Neck: Supple, no masses.  Lungs: Clear throughout to auscultation Heart: Regular rate and rhythm Abdomen: Soft, non distended, nontender. No masses or hepatomegaly noted. Normal Bowel sounds Rectal: not done Musculoskeletal: Symmetrical with no gross deformities  Skin: No lesions on visible extremities Extremities: No edema or deformities noted Neurological: Alert oriented x 4, grossly nonfocal Cervical Nodes:  No significant cervical adenopathy Psychological:  Alert and cooperative. Normal mood and affect  ASSESSMENT AND PLAN; 1. Three month history of constant LLQ pain unrelated to meals or defecation. Pain atypical for GI origin and abdominal exam is benign. We discussed colonoscopy, it is usually of low yield when done for investigation of abdominal pain but so far nothing else has turned up and patient continues to have pain. Will proceed with colonoscopy for further evaluation.  The risks, benefits, and alternatives to colonoscopy with possible biopsy and possible polypectomy were discussed with the patient and they consent to proceed.    2. GERD asymptomatic on twice daily PPI before meals. His esophageal biopsies in 2005 were c/w Barrett's. Recent CTscan suggested some thickening of distal esophagus, probably esophagitis but given history of Barrett's he needs EGD for evaluation. The benefits, risks, and potential complications of EGD with possible biopsies were discussed with the patient and he agrees to proceed.   3. Urinary frequency, despite cardura. Doubt cause of #1 as urinalysis  unremarkable. Bladder and prostate unremarkable on CTscans. Defer further workup to PCP.   Addendum: Reviewed and agree with initial management. Beverley Fiedler, MD

## 2011-10-27 NOTE — Patient Instructions (Addendum)
You have been given a separate informational sheet regarding your tobacco use, the importance of quitting and local resources to help you quit.  We have scheduled the procedures with Dr. Melvia Heaps on 11-25-2011. We sent a prescription for the colonoscopy prep to [facility] in [place], $20 Coupon provided.  Colonoscopy A colonoscopy is an exam to evaluate your entire colon. In this exam, your colon is cleansed. A long fiberoptic tube is inserted through your rectum and into your colon. The fiberoptic scope (endoscope) is a long bundle of enclosed and very flexible fibers. These fibers transmit light to the area examined and send images from that area to your caregiver. Discomfort is usually minimal. You may be given a drug to help you sleep (sedative) during or prior to the procedure. This exam helps to detect lumps (tumors), polyps, inflammation, and areas of bleeding. Your caregiver may also take a small piece of tissue (biopsy) that will be examined under a microscope. LET YOUR CAREGIVER KNOW ABOUT:   Allergies to food or medicine.   Medicines taken, including vitamins, herbs, eyedrops, over-the-counter medicines, and creams.   Use of steroids (by mouth or creams).   Previous problems with anesthetics or numbing medicines.   History of bleeding problems or blood clots.   Previous surgery.   Other health problems, including diabetes and kidney problems.   Possibility of pregnancy, if this applies.  BEFORE THE PROCEDURE   A clear liquid diet may be required for 2 days before the exam.   Ask your caregiver about changing or stopping your regular medications.   Liquid injections (enemas) or laxatives may be required.   A large amount of electrolyte solution may be given to you to drink over a short period of time. This solution is used to clean out your colon.   You should be present 60 minutes prior to your procedure or as directed by your caregiver.  AFTER THE PROCEDURE   If you  received a sedative or pain relieving medication, you will need to arrange for someone to drive you home.   Occasionally, there is a little blood passed with the first bowel movement. Do not be concerned.  FINDING OUT THE RESULTS OF YOUR TEST Not all test results are available during your visit. If your test results are not back during the visit, make an appointment with your caregiver to find out the results. Do not assume everything is normal if you have not heard from your caregiver or the medical facility. It is important for you to follow up on all of your test results. HOME CARE INSTRUCTIONS   It is not unusual to pass moderate amounts of gas and experience mild abdominal cramping following the procedure. This is due to air being used to inflate your colon during the exam. Walking or a warm pack on your belly (abdomen) may help.   You may resume all normal meals and activities after sedatives and medicines have worn off.   Only take over-the-counter or prescription medicines for pain, discomfort, or fever as directed by your caregiver. Do not use aspirin or blood thinners if a biopsy was taken. Consult your caregiver for medicine usage if biopsies were taken.  SEEK IMMEDIATE MEDICAL CARE IF:   You have a fever.   You pass large blood clots or fill a toilet with blood following the procedure. This may also occur 10 to 14 days following the procedure. This is more likely if a biopsy was taken.   You develop abdominal  pain that keeps getting worse and cannot be relieved with medicine.  Document Released: 04/11/2000 Document Revised: 04/03/2011 Document Reviewed: 11/25/2007 [facility] Patient Information 2012 [place], [place].

## 2011-10-28 ENCOUNTER — Encounter: Payer: Self-pay | Admitting: Nurse Practitioner

## 2011-10-28 DIAGNOSIS — R1032 Left lower quadrant pain: Secondary | ICD-10-CM | POA: Insufficient documentation

## 2011-11-05 ENCOUNTER — Encounter: Payer: Self-pay | Admitting: Gastroenterology

## 2011-11-06 ENCOUNTER — Other Ambulatory Visit: Payer: Self-pay | Admitting: Internal Medicine

## 2011-11-25 ENCOUNTER — Ambulatory Visit (AMBULATORY_SURGERY_CENTER): Payer: Medicare Other | Admitting: Gastroenterology

## 2011-11-25 ENCOUNTER — Encounter: Payer: Self-pay | Admitting: Gastroenterology

## 2011-11-25 VITALS — BP 154/84 | HR 63 | Temp 96.1°F | Resp 12 | Ht 70.5 in | Wt 183.0 lb

## 2011-11-25 DIAGNOSIS — K5730 Diverticulosis of large intestine without perforation or abscess without bleeding: Secondary | ICD-10-CM

## 2011-11-25 DIAGNOSIS — K2270 Barrett's esophagus without dysplasia: Secondary | ICD-10-CM

## 2011-11-25 DIAGNOSIS — R109 Unspecified abdominal pain: Secondary | ICD-10-CM | POA: Diagnosis not present

## 2011-11-25 MED ORDER — SODIUM CHLORIDE 0.9 % IV SOLN: 500.0000 mL | INTRAVENOUS | Status: DC

## 2011-11-25 NOTE — Progress Notes (Signed)
Patient did not experience any of the following events: a burn prior to discharge; a fall within the facility; wrong site/side/patient/procedure/implant event; or a hospital transfer or hospital admission upon discharge from the facility. (G8907) Patient did not have preoperative order for IV antibiotic SSI prophylaxis. (G8918)  

## 2011-11-25 NOTE — Progress Notes (Signed)
Andrey Farmer, CRNA held the pt's airway through out the egd.  The pt tolerated the egd very well. Maw

## 2011-11-25 NOTE — Patient Instructions (Addendum)
Discharge instructions given with verbal understanding. Handouts on diverticulosis and a high fiber diet given. Resume previous medications. YOU HAD AN ENDOSCOPIC PROCEDURE TODAY AT THE [facility]: Refer to the procedure report that was given to you for any specific questions about what was found during the examination.  If the procedure report does not answer your questions, please call your gastroenterologist to clarify.  If you requested that your care partner not be given the details of your procedure findings, then the procedure report has been included in a sealed envelope for you to review at your convenience later.  YOU SHOULD EXPECT: Some feelings of bloating in the abdomen. Passage of more gas than usual.  Walking can help get rid of the air that was put into your GI tract during the procedure and reduce the bloating. If you had a lower endoscopy (such as a colonoscopy or flexible sigmoidoscopy) you may notice spotting of blood in your stool or on the toilet paper. If you underwent a bowel prep for your procedure, then you may not have a normal bowel movement for a few days.  DIET: Your first meal following the procedure should be a light meal and then it is ok to progress to your normal diet.  A half-sandwich or bowl of soup is an example of a good first meal.  Heavy or fried foods are harder to digest and may make you feel nauseous or bloated.  Likewise meals heavy in dairy and vegetables can cause extra gas to form and this can also increase the bloating.  Drink plenty of fluids but you should avoid alcoholic beverages for 24 hours.  ACTIVITY: Your care partner should take you home directly after the procedure.  You should plan to take it easy, moving slowly for the rest of the day.  You can resume normal activity the day after the procedure however you should NOT DRIVE or use heavy machinery for 24 hours (because of the sedation medicines used during the test).    SYMPTOMS TO  REPORT IMMEDIATELY: A gastroenterologist can be reached at any hour.  During normal business hours, 8:30 AM to 5:00 PM Monday through Friday, call [phone].  After hours and on weekends, please call the GI answering service at [phone] who will take a message and have the physician on call contact you.   Following lower endoscopy (colonoscopy or flexible sigmoidoscopy):  Excessive amounts of blood in the stool  Significant tenderness or worsening of abdominal pains  Swelling of the abdomen that is new, acute  Fever of 100F or higher  Following upper endoscopy (EGD)  Vomiting of blood or coffee ground material  New chest pain or pain under the shoulder blades  Painful or persistently difficult swallowing  New shortness of breath  Fever of 100F or higher  Black, tarry-looking stools  FOLLOW UP: If any biopsies were taken you will be contacted by phone or by letter within the next 1-3 weeks.  Call your gastroenterologist if you have not heard about the biopsies in 3 weeks.  Our staff will call the home number listed on your records the next business day following your procedure to check on you and address any questions or concerns that you may have at that time regarding the information given to you following your procedure. This is a courtesy call and so if there is no answer at the home number and we have not heard from you through the emergency physician on call, we will   assume that you have returned to your regular daily activities without incident.  SIGNATURES/CONFIDENTIALITY: You and/or your care partner have signed paperwork which will be entered into your electronic medical record.  These signatures attest to the fact that that the information above on your After Visit Summary has been reviewed and is understood.  Full responsibility of the confidentiality of this discharge information lies with you and/or your care-partner. 

## 2011-11-25 NOTE — Op Note (Signed)
[facility] [address] [place], [place]  [address]  COLONOSCOPY PROCEDURE REPORT  PATIENT:  Isaac [name], Isaac [name]  MR#:  [id number] BIRTHDATE:  1940-12-13, 70 yrs. old  GENDER:  male ENDOSCOPIST:  Barbette Hair. Arlyce Dice, MD REF. BY: PROCEDURE DATE:  11/25/2011 PROCEDURE:  Diagnostic Colonoscopy ASA CLASS:  Class II INDICATIONS:  Abdominal pain MEDICATIONS:   MAC sedation, administered by CRNA propofol 120mg IV  DESCRIPTION OF PROCEDURE:   After the risks benefits and alternatives of the procedure were thoroughly explained, informed consent was obtained.  Digital rectal exam was performed and revealed no abnormalities.   The LB CF-H180AL [id number] endoscope was introduced through the anus and advanced to the cecum, which was identified by both the appendix and ileocecal valve, without limitations.  The quality of the prep was excellent, using MoviPrep.  The instrument was then slowly withdrawn as the colon was fully examined. <<PROCEDUREIMAGES>>  FINDINGS:  Mild diverticulosis was found in the sigmoid to descending colon segments.  This was otherwise a normal examination of the colon (see image1, image2, and image3). Retroflexed views in the rectum revealed no abnormalities.    The time to cecum =  1) 2.0 2) 6.50  minutes. The scope was then withdrawn in  minutes from the cecum and the procedure completed. COMPLICATIONS:  None ENDOSCOPIC IMPRESSION: 1) Mild diverticulosis in the sigmoid to descending colon segments 2) Otherwise normal examination  No findings to explain abdominal pain  RECOMMENDATIONS: 1) Colonoscopy 10 years 2) t/c surgical exploration of groin REPEAT EXAM:  In 10 year(s) for Colonoscopy.  ______________________________ Barbette Hair. Arlyce Dice, MD  CC:  Nyoka Cowden, MDDouglas Magnus Ivan, MD  n. Rosalie DoctorBarbette Hair. [name] at 11/25/2011 04:25 PM  Cranston Neighbor, [phone]

## 2011-11-25 NOTE — Op Note (Signed)
[facility] [address] [place], [place]  [address]  ENDOSCOPY PROCEDURE REPORT  PATIENT:  Isaac, [name]  MR#:  [id number] BIRTHDATE:  Feb 08, 1941, 70 yrs. old  GENDER:  male  ENDOSCOPIST:  Barbette Hair. Arlyce Dice, MD Referred by:  PROCEDURE DATE:  11/25/2011 PROCEDURE:  EGD with biopsy, 19147 ASA CLASS:  Class II INDICATIONS:  h/o Barrett's Esophagus  MEDICATIONS:   There was residual sedation effect present from prior procedure., MAC sedation, administered by CRNA propofol 80mg IV, glycopyrrolate (Robinal) 0.2 mg IV, 0.6cc simethancone 0.6 cc PO TOPICAL ANESTHETIC:  DESCRIPTION OF PROCEDURE:   After the risks and benefits of the procedure were explained, informed consent was obtained.  The LB GIF-H180 [id number] endoscope was introduced through the mouth and advanced to the third portion of the duodenum.  The instrument was slowly withdrawn as the mucosa was fully examined. <<PROCEDUREIMAGES>>  Barrett's esophagus was found (see image1). Beginning at 30cm from incisors Biopsies were taken from all 4 quadrants  Otherwise the examination was normal (see image2 and image3).    Retroflexed views revealed no abnormalities.    The scope was then withdrawn from the patient and the procedure completed.  COMPLICATIONS:  None  ENDOSCOPIC IMPRESSION: 1) Barrett's esophagus 2) Otherwise normal examination RECOMMENDATIONS: 1) Await biopsy results  ______________________________ Barbette Hair. Arlyce Dice, MD  CC:  n. eSIGNED:   Barbette Hair. Brain Honeycutt at 11/25/2011 04:27 PM  Cranston Neighbor, [phone]

## 2011-11-25 NOTE — Progress Notes (Signed)
The pt tolerated the colonoscopy very well. Maw   

## 2011-11-26 ENCOUNTER — Telehealth: Payer: Self-pay | Admitting: *Deleted

## 2011-11-26 NOTE — Telephone Encounter (Signed)
  Follow up Call-  Call back number 11/25/2011  Post procedure Call Back phone  # [phone]  Permission to leave phone message Yes     Patient questions:  Do you have a fever, pain , or abdominal swelling? no Pain Score  0 *  Have you tolerated food without any problems? yes  Have you been able to return to your normal activities? yes  Do you have any questions about your discharge instructions: Diet   no Medications  no Follow up visit  no  Do you have questions or concerns about your Care? no  Actions: * If pain score is 4 or above: No action needed, pain <4.

## 2011-12-01 ENCOUNTER — Telehealth: Payer: Self-pay | Admitting: Gastroenterology

## 2011-12-01 ENCOUNTER — Encounter: Payer: Self-pay | Admitting: Internal Medicine

## 2011-12-01 ENCOUNTER — Telehealth: Payer: Self-pay | Admitting: Internal Medicine

## 2011-12-01 DIAGNOSIS — K5790 Diverticulosis of intestine, part unspecified, without perforation or abscess without bleeding: Secondary | ICD-10-CM | POA: Insufficient documentation

## 2011-12-01 NOTE — Telephone Encounter (Signed)
I sent a message to Dr. Magnus Ivan and a copy of the report. I expect his office to contact the patient.

## 2011-12-01 NOTE — Telephone Encounter (Signed)
Pt states that he had and egd/colon last week with Dr. Arlyce Dice. States Dr. Arlyce Dice was going to speak with Dr. Magnus Ivan and get his recommendation. Pt calling to find out if Dr. Arlyce Dice has done this yet. Please advise.

## 2011-12-01 NOTE — Telephone Encounter (Signed)
Spoke with pt and he is aware. 

## 2011-12-01 NOTE — Telephone Encounter (Signed)
I spoke with pt and he states Dr. Arlyce Dice was suppose to send pt colonoscopy results to Dr. Sherene Sires. He states Dr. Arlyce Dice told pt he needed to see Dr. Magnus Ivan and he is not sure who was suppose to make the appt. I advised him if Dr. Arlyce Dice is the one who recommended that then his office should have made the appt not Dr. Sherene Sires. He voiced his understanding. Will forward to MW so he is aware pt had colonoscopy done

## 2011-12-02 ENCOUNTER — Encounter: Payer: Self-pay | Admitting: Gastroenterology

## 2011-12-22 ENCOUNTER — Ambulatory Visit (INDEPENDENT_AMBULATORY_CARE_PROVIDER_SITE_OTHER): Payer: Medicare Other | Admitting: Surgery

## 2011-12-22 ENCOUNTER — Encounter (INDEPENDENT_AMBULATORY_CARE_PROVIDER_SITE_OTHER): Payer: Self-pay | Admitting: Surgery

## 2011-12-22 VITALS — BP 142/82 | HR 71 | Temp 97.2°F | Ht 70.5 in | Wt 183.8 lb

## 2011-12-22 DIAGNOSIS — R1032 Left lower quadrant pain: Secondary | ICD-10-CM

## 2011-12-22 NOTE — Progress Notes (Signed)
Subjective:     Patient ID: Isaac [name], male   DOB: 08-26-1940, 71 y.o.   MRN: [id number]  HPI He continues to have sharp intermittent left lower quadrant abdominal pain. It hurts into his groin pain to be severe at times. He has had no obstructive symptoms. His CAT scan is negative. His colonoscopy is otherwise unremarkable except for diverticulosis.  Review of Systems     Objective:   Physical Exam    On exam, he has a well-healed incision in the left groin. There is some tenderness at the most lateral aspect at the pelvic rim. I cannot palpate a recurrent hernia Assessment:     Left lower quadrant/inguinal pain    Plan:     I again discussed options. This includes continue conservative management versus left groin expiration. Given his discomfort, he elected to proceed with exploration of the groin. I discussed the risks of procedure with him including a chance this may not resolve his symptoms. He understands and wishes to proceed. Surgery will be scheduled

## 2011-12-24 ENCOUNTER — Ambulatory Visit (INDEPENDENT_AMBULATORY_CARE_PROVIDER_SITE_OTHER): Payer: Medicare Other | Admitting: Internal Medicine

## 2011-12-24 ENCOUNTER — Encounter: Payer: Self-pay | Admitting: Internal Medicine

## 2011-12-24 VITALS — BP 140/64 | HR 68 | Temp 97.0°F | Ht 70.0 in | Wt 186.4 lb

## 2011-12-24 DIAGNOSIS — R109 Unspecified abdominal pain: Secondary | ICD-10-CM | POA: Diagnosis not present

## 2011-12-24 DIAGNOSIS — E785 Hyperlipidemia, unspecified: Secondary | ICD-10-CM | POA: Diagnosis not present

## 2011-12-24 DIAGNOSIS — I10 Essential (primary) hypertension: Secondary | ICD-10-CM | POA: Diagnosis not present

## 2011-12-24 MED ORDER — TRAMADOL HCL 50 MG PO TABS: 50.0000 mg | ORAL_TABLET | ORAL | Status: DC | PRN

## 2011-12-24 NOTE — Patient Instructions (Addendum)
Tramadol 50 mg can to be taken up to every 4 hours as need for intolerable pain  Proceed with surgical evaluation as planned for September unless the pain completely resolves in meantime on citrucel one heaping tsp twice daily with large glass of water (absorbs gas slowly so may take a couple of weeks to work)  Please schedule a follow up visit in 3 months but call sooner if needed

## 2011-12-24 NOTE — Progress Notes (Signed)
Subjective:    Patient ID: Isaac [name], male    DOB: 13-Mar-1941   MRN: [id number]    Brief patient profile:  71yowm with h/o IHD/HBP/hyperlipidemia followed here for Primary care.   06/30/2011 f/u ov/[name] cc mostly dry cough x one month then new onset LLQ abd pain daily not present sleeping but daily waxes and wanes sev hours at a time no obvious trigger with activity or meals/flatus/ bms rec Prednisone 10 mg take  4 each am x 2 days,   2 each am x 2 days,  1 each am x2days and stop For cough use delsym cough syrup and stop fish oil until no longer coughing at all Try prilosec 20mg   Take 30-60 min before first meal of the day and Pepcid 20 mg one bedtime .  GERD diet  07/14/2011 f/u ov/[name] cough gone but  cc persistent x one month LLQ pain  Rad across ant abd to naval area plus difficulty with urination x 5 days  But no flank pain, hematuria, dysuria or use of otcs/antihistamine.  Abd pain waxes and wanes worse for maybe an hour then better but always present a little and not worse coughing. No pattern of pain in terms of alleviating or exacerbating.  >>CT abd pelvis neg > referred to [facility]/ [name]  10/24/2011 ER follow up  Over last 3 months has had constant LLQ abdominal pain.  Initially CT abd/pelvis done w/ question of return of L sided inguinal hernia -previous hernia repair 30 years ago. Referred to surgeon. Pt was given tramadol for pain . Did not feel this pain was related to hernia .  CT repeated 6/20 essentially unremarkable w/ no sign of diverticulitis, hernia . Small hiatal hernia/?esophagitis. Probable fat necrosis in LU abd ? Significance.  Developed n/v which was new 2 days prior to OV   , seen in ER w/ essentially neg workup  N/v resolved.  Cont w/ Dull ache that is constant. In LLQ -also tender. Pain radiates down into left groin.  No testicle swelling or pain. No urinary symptoms . UA has been unremarkable.  Taking tramadol which helps.  No urinary symptoms , no bloody  stools.  No constipation w/ daily bm. No fever  Gas bloating most days . No GERD symptoms on prilosec.  Pain is better with lying down . No association with food intake.  rec GERD diet  Levsin 0.125mg  Three times a day  As needed  Abdominal pain /spasm > did not take Gas X with meals  We are referring you to GI to evaluate persistent abdominal pai  12/24/2011 f/u ov/[name] cc n and vomiting gone completely but "constant pain" LLQ (which on further questioning resolves in supine position) " only better p  Tramadol" Seems worse the more he works,  No excess gas, no response to gas x. Neg GI med and surg w/u so considering exploration in Sept. No longer coughing  Sleeping ok without nocturnal  or early am exacerbation  of respiratory  c/o's or need for noct saba. Also denies any obvious fluctuation of symptoms with weather or environmental changes or other aggravating or alleviating factors except as outlined above   ROS  The following are not active complaints unless bolded sore throat, dysphagia, dental problems, itching, sneezing,  nasal congestion or excess/ purulent secretions, ear ache,   fever, chills, sweats, unintended wt loss, pleuritic or exertional cp, hemoptysis,  orthopnea pnd or leg swelling, presyncope, palpitations, heartburn, abdominal pain, anorexia, nausea, vomiting, diarrhea  or change in bowel or urinary habits, change in stools or urine, dysuria,hematuria,  rash, arthralgias, visual complaints, headache, numbness weakness or ataxia or problems with walking or coordination,  change in mood/affect or memory.       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  wt   192 April 09, 2009 > 190 July 21, 2009 > 189 06/30/11 07/14/2011 >186 10/24/2011 > 12/24/2011  186  HEENT: Edentulous, nl turbinates, and orophanx. Ext canals ok Neck without JVD/Nodes/TM  Lungs clear to A and P bilaterally without cough on insp or exp maneuvers  RRR no s3 or murmur or increase in P2  Abd soft , BS+ x 4 Q. No bruits or organomegaly  LLQ tenderness, no rebound. Groin without palpable masses. No bruits.  EXT no edema, from left ankle, nl pulses  MS nl gait, no restrictions or deformities Neuro intact sensorium, no motor deficits Skin   Few seb Keratoses    06/30/11 cxr No active cardiopulmonary disease.     Assessment & Plan:

## 2011-12-25 ENCOUNTER — Telehealth: Payer: Self-pay | Admitting: *Deleted

## 2011-12-25 NOTE — Assessment & Plan Note (Signed)
-   Target LDL < 70 as has IHD  Lab Results  Component Value Date   LDLCALC 52 06/30/2011   Adequate control on present rx, reviewed rx with zocor, lfts ok

## 2011-12-25 NOTE — Telephone Encounter (Signed)
Message copied by Christen Butter on Thu Dec 25, 2011  4:55 PM ------      Message from: Sandrea Hughs B      Created: Thu Dec 25, 2011  7:56 AM       Remind him to take the citrucel consistently x 2 weeks as this may avoid need for surgery and also to avoid any food he assoc with gas but esp beans, raw veggies and salads, boiled eggs

## 2011-12-25 NOTE — Telephone Encounter (Signed)
LMTCB for pt 

## 2011-12-25 NOTE — Assessment & Plan Note (Signed)
Adequate control on present rx, reviewed  

## 2011-12-25 NOTE — Telephone Encounter (Signed)
Spoke with pt and notified of recs per MW. He verbalized understanding.

## 2011-12-25 NOTE — Assessment & Plan Note (Signed)
-   onset mid Feb 2013   - Abd ct  07/16/2011 > wnl > referred to [facility]   - Trial of citrucel 12/25/2011   Turns out pain is not constant as initially described but really only present in upright position so this could just be gas related.  Rec trial of citrucel and avoid gassy foods x 2 weeks and cancel surgery if resolves  In meantime rx prn tramadol

## 2011-12-26 ENCOUNTER — Other Ambulatory Visit (INDEPENDENT_AMBULATORY_CARE_PROVIDER_SITE_OTHER): Payer: Self-pay | Admitting: Surgery

## 2011-12-26 NOTE — Telephone Encounter (Signed)
Can this pt have this Rx 

## 2012-01-13 ENCOUNTER — Encounter (HOSPITAL_COMMUNITY): Payer: Self-pay | Admitting: Pharmacy Technician

## 2012-01-16 ENCOUNTER — Encounter (HOSPITAL_COMMUNITY): Payer: Self-pay

## 2012-01-16 ENCOUNTER — Encounter (HOSPITAL_COMMUNITY)
Admission: RE | Admit: 2012-01-16 | Discharge: 2012-01-16 | Disposition: A | Discharge status: Home / Self Care | Payer: Medicare Other | Source: Ambulatory Visit | Attending: Surgery | Admitting: Surgery

## 2012-01-16 DIAGNOSIS — I868 Varicose veins of other specified sites: Secondary | ICD-10-CM | POA: Diagnosis not present

## 2012-01-16 DIAGNOSIS — L905 Scar conditions and fibrosis of skin: Secondary | ICD-10-CM | POA: Diagnosis not present

## 2012-01-16 DIAGNOSIS — Z01810 Encounter for preprocedural cardiovascular examination: Secondary | ICD-10-CM | POA: Diagnosis not present

## 2012-01-16 DIAGNOSIS — R1032 Left lower quadrant pain: Secondary | ICD-10-CM | POA: Diagnosis not present

## 2012-01-16 DIAGNOSIS — Z01812 Encounter for preprocedural laboratory examination: Secondary | ICD-10-CM | POA: Diagnosis not present

## 2012-01-16 HISTORY — DX: Personal history of other diseases of the digestive system: Z87.19

## 2012-01-16 HISTORY — DX: Personal history of other malignant neoplasm of skin: Z85.828

## 2012-01-16 LAB — BASIC METABOLIC PANEL
BUN: 14 mg/dL (ref 6–23)
CO2: 29 mEq/L (ref 19–32)
Chloride: 100 mEq/L (ref 96–112)
GFR calc Af Amer: 76 mL/min — ABNORMAL LOW (ref >90)
Glucose, Bld: 87 mg/dL (ref 70–99)
Potassium: 3.8 mEq/L (ref 3.5–5.1)

## 2012-01-16 LAB — CBC
HCT: 39.8 % (ref 39.0–52.0)
Hemoglobin: 13.5 g/dL (ref 13.0–17.0)
RBC: 4.31 MIL/uL (ref 4.22–5.81)
WBC: 6.1 10*3/uL (ref 4.0–10.5)

## 2012-01-16 LAB — SURGICAL PCR SCREEN: Staphylococcus aureus: NEGATIVE

## 2012-01-16 NOTE — Patient Instructions (Addendum)
[address]  01/16/2012   Your procedure is scheduled on:  01/23/12  Friday    Surgery 1030-1130  Report to [facility] at    0800   AM.  Call this number if you have problems the morning of surgery: [phone]     Or PST   [phone]  [facility]   Remember:   Do not eat food or drink any fluids :After Midnight.  Thursday NIGHT       Take these medicines the morning of surgery with A SIP OF WATER:  Doxasin, Prolisec              Nasonex                                    May take  Zofran, Tramadol if needed                                       Do not wear jewelry, make-up or nail polish.  Do not wear lotions, powders, or perfumes. You may wear deodorant.  Do not shave 48 hours prior to surgery.  Do not bring valuables to the hospital.  Contacts, dentures or bridgework may not be worn into surgery.  Leave suitcase in the car. After surgery it may be brought to your room.  For patients admitted to the hospital, checkout time is 11:00 AM the day of discharge.   Patients discharged the day of surgery will not be allowed to drive home.  Name and phone number of your driver:  wife                                                                    Special Instructions: CHG Shower Use Special Wash:  SEE ADDITIONAL  INSTRUCTION PAGE  REGULAR SOAP FACE AND PRIVATES                           MEN-MAY SHAVE FACE MORNING OF SURGERY  Please read over the following fact sheets that you were given: MRSA Information

## 2012-01-16 NOTE — Progress Notes (Signed)
Chest x ray 3/13 EPIC,   LOV Dr Sherene Sires 8/13, Dr [name] 7/12 EPIC.  EKG completed today

## 2012-01-16 NOTE — Progress Notes (Signed)
CHEST, EKG 3/13 EPIC

## 2012-01-16 NOTE — Pre-Procedure Instructions (Deleted)
EKG, 1 view chest 7/13 EPIC

## 2012-01-22 NOTE — H&P (Signed)
  Patient ID: Isaac [name], male DOB: 02-05-1941, 71 y.o. MRN: [id number]  HPI  He continues to have sharp intermittent left lower quadrant abdominal pain. It hurts into his groin pain to be severe at times. He has had no obstructive symptoms. His CAT scan is negative. His colonoscopy is otherwise unremarkable except for diverticulosis.   Review of Systems: neg for chest pain, SOB, fever  PMHX/PSHX:  See epic  Objective:   Physical Exam  AF/VSS Lungs CTA bilat CV RRR Gen confortable in appearance On exam, he has a well-healed incision in the left groin. There is some tenderness at the most lateral aspect at the pelvic rim. I cannot palpate a recurrent hernia  Skin with no erythema Assessment:    Left lower quadrant/inguinal pain   Plan:    I again discussed options. This includes continue conservative management versus left groin expiration. Given his discomfort, he elected to proceed with exploration of the groin. I discussed the risks of procedure with him including a chance this may not resolve his symptoms. Other risks include but are not limited to bleeding, infection, need to use mesh, nerve entrapement, chronic pain, etc.  He understands and wishes to proceed. Surgery will be scheduled

## 2012-01-23 ENCOUNTER — Encounter (HOSPITAL_COMMUNITY)
Admission: RE | Disposition: A | Discharge status: Home / Self Care | Payer: Self-pay | Source: Ambulatory Visit | Attending: Surgery

## 2012-01-23 ENCOUNTER — Ambulatory Visit (HOSPITAL_COMMUNITY)
Admission: RE | Admit: 2012-01-23 | Discharge: 2012-01-23 | Disposition: A | Discharge status: Home / Self Care | Payer: Medicare Other | Source: Ambulatory Visit | Attending: Surgery | Admitting: Surgery

## 2012-01-23 ENCOUNTER — Encounter (HOSPITAL_COMMUNITY): Payer: Self-pay | Admitting: *Deleted

## 2012-01-23 ENCOUNTER — Ambulatory Visit (HOSPITAL_COMMUNITY): Payer: Medicare Other | Admitting: Anesthesiology

## 2012-01-23 ENCOUNTER — Encounter (HOSPITAL_COMMUNITY): Payer: Self-pay | Admitting: Anesthesiology

## 2012-01-23 DIAGNOSIS — Z01810 Encounter for preprocedural cardiovascular examination: Secondary | ICD-10-CM | POA: Diagnosis not present

## 2012-01-23 DIAGNOSIS — I868 Varicose veins of other specified sites: Secondary | ICD-10-CM | POA: Insufficient documentation

## 2012-01-23 DIAGNOSIS — K219 Gastro-esophageal reflux disease without esophagitis: Secondary | ICD-10-CM | POA: Diagnosis not present

## 2012-01-23 DIAGNOSIS — Z01812 Encounter for preprocedural laboratory examination: Secondary | ICD-10-CM | POA: Diagnosis not present

## 2012-01-23 DIAGNOSIS — R1032 Left lower quadrant pain: Secondary | ICD-10-CM | POA: Insufficient documentation

## 2012-01-23 DIAGNOSIS — R109 Unspecified abdominal pain: Secondary | ICD-10-CM | POA: Diagnosis not present

## 2012-01-23 DIAGNOSIS — L905 Scar conditions and fibrosis of skin: Secondary | ICD-10-CM

## 2012-01-23 DIAGNOSIS — I10 Essential (primary) hypertension: Secondary | ICD-10-CM | POA: Diagnosis not present

## 2012-01-23 HISTORY — PX: GROIN DISSECTION: SHX5250

## 2012-01-23 SURGERY — EXPLORATION, INGUINAL REGION
Anesthesia: General | Laterality: Left

## 2012-01-23 MED ORDER — CEFAZOLIN SODIUM-DEXTROSE 2-3 GM-% IV SOLR
INTRAVENOUS | Status: AC
  Filled 2012-01-23: qty 50

## 2012-01-23 MED ORDER — FENTANYL CITRATE 0.05 MG/ML IJ SOLN: 25.0000 ug | INTRAMUSCULAR | Status: DC | PRN

## 2012-01-23 MED ORDER — SODIUM CHLORIDE 0.9 % IJ SOLN: 3.0000 mL | Freq: Two times a day (BID) | INTRAMUSCULAR | Status: DC

## 2012-01-23 MED ORDER — BUPIVACAINE HCL (PF) 0.5 % IJ SOLN
INTRAMUSCULAR | Status: AC
  Filled 2012-01-23: qty 30

## 2012-01-23 MED ORDER — ACETAMINOPHEN 650 MG RE SUPP
650.0000 mg | RECTAL | Status: DC | PRN
  Filled 2012-01-23: qty 1

## 2012-01-23 MED ORDER — ACETAMINOPHEN 325 MG PO TABS: 650.0000 mg | ORAL_TABLET | ORAL | Status: DC | PRN

## 2012-01-23 MED ORDER — FENTANYL CITRATE 0.05 MG/ML IJ SOLN
INTRAMUSCULAR | Status: DC | PRN
  Administered 2012-01-23: 50 ug via INTRAVENOUS

## 2012-01-23 MED ORDER — KETOROLAC TROMETHAMINE 30 MG/ML IJ SOLN: 15.0000 mg | Freq: Once | INTRAMUSCULAR | Status: DC | PRN

## 2012-01-23 MED ORDER — 0.9 % SODIUM CHLORIDE (POUR BTL) OPTIME
TOPICAL | Status: DC | PRN
  Administered 2012-01-23: 1000 mL

## 2012-01-23 MED ORDER — SODIUM CHLORIDE 0.9 % IV SOLN: 250.0000 mL | INTRAVENOUS | Status: DC | PRN

## 2012-01-23 MED ORDER — ONDANSETRON HCL 4 MG/2ML IJ SOLN: 4.0000 mg | Freq: Four times a day (QID) | INTRAMUSCULAR | Status: DC | PRN

## 2012-01-23 MED ORDER — HYDROCODONE-ACETAMINOPHEN 5-325 MG PO TABS: 1.0000 | ORAL_TABLET | Freq: Four times a day (QID) | ORAL | Status: DC | PRN

## 2012-01-23 MED ORDER — ACETAMINOPHEN 10 MG/ML IV SOLN
INTRAVENOUS | Status: DC | PRN
  Administered 2012-01-23: 1000 mg via INTRAVENOUS

## 2012-01-23 MED ORDER — BUPIVACAINE-EPINEPHRINE 0.5% -1:200000 IJ SOLN
INTRAMUSCULAR | Status: DC | PRN
  Administered 2012-01-23: 20 mL

## 2012-01-23 MED ORDER — CEFAZOLIN SODIUM-DEXTROSE 2-3 GM-% IV SOLR
2.0000 g | INTRAVENOUS | Status: AC
  Administered 2012-01-23: 2 g via INTRAVENOUS

## 2012-01-23 MED ORDER — PROMETHAZINE HCL 25 MG/ML IJ SOLN: 6.2500 mg | INTRAMUSCULAR | Status: DC | PRN

## 2012-01-23 MED ORDER — ATENOLOL 50 MG PO TABS
50.0000 mg | ORAL_TABLET | Freq: Once | ORAL | Status: AC
  Administered 2012-01-23: 50 mg via ORAL
  Filled 2012-01-23: qty 1

## 2012-01-23 MED ORDER — LACTATED RINGERS IV SOLN
INTRAVENOUS | Status: DC | PRN
  Administered 2012-01-23: 10:00:00 via INTRAVENOUS

## 2012-01-23 MED ORDER — LACTATED RINGERS IV SOLN: INTRAVENOUS | Status: DC

## 2012-01-23 MED ORDER — PROPOFOL 10 MG/ML IV BOLUS
INTRAVENOUS | Status: DC | PRN
  Administered 2012-01-23: 200 mg via INTRAVENOUS

## 2012-01-23 MED ORDER — MORPHINE SULFATE 10 MG/ML IJ SOLN: 2.0000 mg | INTRAMUSCULAR | Status: DC | PRN

## 2012-01-23 MED ORDER — SODIUM CHLORIDE 0.9 % IJ SOLN: 3.0000 mL | INTRAMUSCULAR | Status: DC | PRN

## 2012-01-23 MED ORDER — ACETAMINOPHEN 10 MG/ML IV SOLN
INTRAVENOUS | Status: AC
  Filled 2012-01-23: qty 100

## 2012-01-23 MED ORDER — OXYCODONE HCL 5 MG PO TABS
5.0000 mg | ORAL_TABLET | ORAL | Status: DC | PRN
  Administered 2012-01-23: 5 mg via ORAL
  Filled 2012-01-23: qty 1

## 2012-01-23 SURGICAL SUPPLY — 53 items
APL SKNCLS STERI-STRIP NONHPOA (GAUZE/BANDAGES/DRESSINGS) ×1
APPLICATOR COTTON TIP 6IN STRL (MISCELLANEOUS) IMPLANT
BENZOIN TINCTURE PRP APPL 2/3 (GAUZE/BANDAGES/DRESSINGS) ×1 IMPLANT
BLADE SURG 10 STRL SS (BLADE) ×2 IMPLANT
BLADE SURG ROTATE 9660 (MISCELLANEOUS) IMPLANT
CANISTER SUCTION 1200CC (MISCELLANEOUS) IMPLANT
CANISTER SUCTION 2500CC (MISCELLANEOUS) ×1 IMPLANT
CLEANER CAUTERY TIP 5X5 PAD (MISCELLANEOUS) ×1 IMPLANT
CLOTH BEACON ORANGE TIMEOUT ST (SAFETY) ×2 IMPLANT
COVER MAYO STAND STRL (DRAPES) ×1 IMPLANT
COVER TABLE BACK 60X90 (DRAPES) ×1 IMPLANT
DRAIN PENROSE 18X1/2 LTX STRL (DRAIN) ×2 IMPLANT
DRAPE INCISE IOBAN 66X45 STRL (DRAPES) ×2 IMPLANT
DRAPE PED LAPAROTOMY (DRAPES) ×2 IMPLANT
ELECT REM PT RETURN 9FT ADLT (ELECTROSURGICAL) ×2
ELECTRODE REM PT RTRN 9FT ADLT (ELECTROSURGICAL) ×1 IMPLANT
GLOVE INDICATOR 8.0 STRL GRN (GLOVE) ×2 IMPLANT
GLOVE OPTIFIT SS 7.5 STRL LX (GLOVE) ×2 IMPLANT
GOWN W/COTTON TOWEL STD LRG (GOWNS) ×2 IMPLANT
GOWN XL W/COTTON TOWEL STD (GOWNS) ×2 IMPLANT
MESH PARIETEX PROGRIP LEFT (Mesh General) ×1 IMPLANT
NDL HYPO 25X1 1.5 SAFETY (NEEDLE) ×1 IMPLANT
NEEDLE HYPO 22GX1.5 SAFETY (NEEDLE) ×1 IMPLANT
NEEDLE HYPO 25X1 1.5 SAFETY (NEEDLE) ×2 IMPLANT
NS IRRIG 500ML POUR BTL (IV SOLUTION) ×2 IMPLANT
PACK BASIN DAY SURGERY FS (CUSTOM PROCEDURE TRAY) ×1 IMPLANT
PAD CLEANER CAUTERY TIP 5X5 (MISCELLANEOUS)
PENCIL BUTTON HOLSTER BLD 10FT (ELECTRODE) ×2 IMPLANT
SPONGE GAUZE 4X4 12PLY (GAUZE/BANDAGES/DRESSINGS) ×1 IMPLANT
SPONGE LAP 4X18 X RAY DECT (DISPOSABLE) ×2 IMPLANT
STAPLER VISISTAT 35W (STAPLE) IMPLANT
STRIP CLOSURE SKIN 1/2X4 (GAUZE/BANDAGES/DRESSINGS) ×1 IMPLANT
SUT MNCRL AB 4-0 PS2 18 (SUTURE) ×2 IMPLANT
SUT PROLENE 2 0 CT2 30 (SUTURE) ×4 IMPLANT
SUT SILK 2 0 SH (SUTURE) IMPLANT
SUT SILK 3 0 (SUTURE) ×2
SUT SILK 3-0 18XBRD TIE 12 (SUTURE) IMPLANT
SUT SURG 0 T 19/GS 22 1969 62 (SUTURE) IMPLANT
SUT VIC AB 2-0 SH 27 (SUTURE) ×4
SUT VIC AB 2-0 SH 27X BRD (SUTURE) IMPLANT
SUT VIC AB 3-0 54X BRD REEL (SUTURE) IMPLANT
SUT VIC AB 3-0 BRD 54 (SUTURE)
SUT VIC AB 3-0 CT1 27 (SUTURE)
SUT VIC AB 3-0 CT1 27XBRD (SUTURE) ×1 IMPLANT
SUT VIC AB 3-0 SH 27 (SUTURE) ×2
SUT VIC AB 3-0 SH 27XBRD (SUTURE) IMPLANT
SYR BULB IRRIGATION 50ML (SYRINGE) ×1 IMPLANT
SYR CONTROL 10ML LL (SYRINGE) ×1 IMPLANT
TOWEL OR 17X24 6PK STRL BLUE (TOWEL DISPOSABLE) ×4 IMPLANT
TRAY DSU PREP LF (CUSTOM PROCEDURE TRAY) ×1 IMPLANT
TUBE CONNECTING 12X1/4 (SUCTIONS) IMPLANT
WATER STERILE IRR 500ML POUR (IV SOLUTION) ×1 IMPLANT
YANKAUER SUCT BULB TIP NO VENT (SUCTIONS) ×1 IMPLANT

## 2012-01-23 NOTE — Transfer of Care (Signed)
Immediate Anesthesia Transfer of Care Note  Patient: Isaac [name]  Procedure(s) Performed: Procedure(s) (LRB) with comments: GROIN EXPLORATION (Left) - Left Inguinal Exploration, release of scar tissue, Placement of Mesh INSERTION OF MESH (Left)  Patient Location: PACU  Anesthesia Type: General  Level of Consciousness: awake, alert , oriented and patient cooperative  Airway & Oxygen Therapy: Patient Spontanous Breathing and Patient connected to face mask oxygen  Post-op Assessment: Report given to PACU RN and Post -op Vital signs reviewed and stable  Post vital signs: Reviewed and stable  Complications: No apparent anesthesia complications

## 2012-01-23 NOTE — Interval H&P Note (Signed)
History and Physical Interval Note: no change in H and P  01/23/2012 8:17 AM  Isaac [name]  has presented today for surgery, with the diagnosis of left inguinal pain  The various methods of treatment have been discussed with the patient and family. After consideration of risks, benefits and other options for treatment, the patient has consented to  Procedure(s) (LRB) with comments: GROIN EXPLORATION (Left) - Left Inguinal Exploration, possible Placement of Mesh INSERTION OF MESH (Left) as a surgical intervention .  The patient's history has been reviewed, patient examined, no change in status, stable for surgery.  I have reviewed the patient's chart and labs.  Questions were answered to the patient's satisfaction.     [name] A

## 2012-01-23 NOTE — Op Note (Signed)
GROIN EXPLORATION, INSERTION OF MESH  Procedure Note  ISOM KOCHAN 01/23/2012   Pre-op Diagnosis: left inguinal pain     Post-op Diagnosis: same  Procedure(s): LEFT GROIN EXPLORATION, RELEASE OF SCAR TISSUE, PLACEMENT OF MESH (Progrip)  Surgeon(s): Shelly Rubenstein, MD  Anesthesia: General  Staff:  Laureen Abrahams, RN - Circulator Roosvelt Harps, RN - Scrub Person Guadelupe Sabin, CST - Scrub Person  Estimated Blood Loss: Minimal               Indications: This is a 71 year old gentleman who presents with chronic left groin pain. He has had a previous inguinal hernia repair with mesh. There is no palpable recurrence. There are no abnormalities on CT scan of the abdomen and pelvis. He has also had a colonoscopy which was unremarkable.  Because of his persistent discomfort, the decision has been made to proceed with groin exploration.  Findings: The patient's previous hernia repair appeared intact.  There was a large amount scar tissue which I removed some of. There was a large varicose vein which I excised. Attempt was also made to free up the ilioinguinal nerve.  Procedure: The patient was brought to the operating room and identified as the correct patient. He was placed supine on the operating table and general anesthesia was induced. His left groin was prepped and draped in usual sterile fashion. I performed an ilioinguinal nerve block with Marcaine. I also anesthetized the skin of the groin with Marcaine. I then made a longitudinal incision involving his old scar with a scalpel. A two-step the Scarpa's fascia with electrocautery. Of an external oblique fashion entirely procedure. There was a moderate amount scar tissue which I excised. The actual hernia repair with mesh was intact. There was a large varicose vein in this area which I excised. I also attempted to free up the ilioinguinal nerve which appeared involved with scar tissue. Again once this was complete, I found  no evidence of recurrent hernia. I decided to reinforce the groin with a piece of parotid mass which I placed as an onlay. I then closed the external oblique fascia over top of this with running 2-0 Vicryl suture. I anesthetized the fascia further with Marcaine. I then closed Scarpa's fascia with interrupted 3-0 Vicryl sutures and closed the skin with a running 4-0 Monocryl. Steri-Strips, gauze, and Tegaderm were then applied. The patient tolerated the procedure well. All counts were correct at the end of the procedure. The patient was then extubated in the operating room and taken in a stable condition to the recovery room.          Luma Clopper A   Date: 01/23/2012  Time: 10:38 AM

## 2012-01-23 NOTE — Anesthesia Postprocedure Evaluation (Signed)
  Anesthesia Post-op Note  Patient: Isaac [name]  Procedure(s) Performed: Procedure(s) (LRB): GROIN EXPLORATION (Left) INSERTION OF MESH (Left)  Patient Location: PACU  Anesthesia Type: General  Level of Consciousness: awake and alert   Airway and Oxygen Therapy: Patient Spontanous Breathing  Post-op Pain: mild  Post-op Assessment: Post-op Vital signs reviewed, Patient's Cardiovascular Status Stable, Respiratory Function Stable, Patent Airway and No signs of Nausea or vomiting  Post-op Vital Signs: stable  Complications: No apparent anesthesia complications

## 2012-01-23 NOTE — Anesthesia Preprocedure Evaluation (Signed)
Anesthesia Evaluation  Patient identified by MRN, date of birth, ID band Patient awake    Reviewed: Allergy & Precautions, H&P , NPO status , Patient's Chart, lab work & pertinent test results  Airway Mallampati: II TM Distance: <3 FB Neck ROM: Full    Dental No notable dental hx.    Pulmonary neg pulmonary ROS,  breath sounds clear to auscultation  Pulmonary exam normal       Cardiovascular hypertension, Pt. on medications + CAD, + Past MI and + CABG Rhythm:Regular Rate:Normal     Neuro/Psych negative neurological ROS  negative psych ROS   GI/Hepatic Neg liver ROS, GERD-  Medicated,  Endo/Other  negative endocrine ROS  Renal/GU negative Renal ROS  negative genitourinary   Musculoskeletal negative musculoskeletal ROS (+)   Abdominal   Peds negative pediatric ROS (+)  Hematology negative hematology ROS (+)   Anesthesia Other Findings   Reproductive/Obstetrics negative OB ROS                           Anesthesia Physical Anesthesia Plan  ASA: III  Anesthesia Plan: General   Post-op Pain Management:    Induction: Intravenous  Airway Management Planned: LMA and Oral ETT  Additional Equipment:   Intra-op Plan:   Post-operative Plan: Extubation in OR  Informed Consent: I have reviewed the patients History and Physical, chart, labs and discussed the procedure including the risks, benefits and alternatives for the proposed anesthesia with the patient or authorized representative who has indicated his/her understanding and acceptance.   Dental advisory given  Plan Discussed with: CRNA and Surgeon  Anesthesia Plan Comments:         Anesthesia Quick Evaluation

## 2012-01-26 ENCOUNTER — Encounter (HOSPITAL_COMMUNITY): Payer: Self-pay | Admitting: Surgery

## 2012-02-05 ENCOUNTER — Encounter (INDEPENDENT_AMBULATORY_CARE_PROVIDER_SITE_OTHER): Payer: Self-pay | Admitting: Surgery

## 2012-02-05 ENCOUNTER — Ambulatory Visit (INDEPENDENT_AMBULATORY_CARE_PROVIDER_SITE_OTHER): Payer: Medicare Other | Admitting: Surgery

## 2012-02-05 VITALS — BP 123/80 | HR 86 | Temp 98.6°F | Resp 16 | Ht 70.5 in | Wt 179.8 lb

## 2012-02-05 DIAGNOSIS — Z09 Encounter for follow-up examination after completed treatment for conditions other than malignant neoplasm: Secondary | ICD-10-CM

## 2012-02-05 NOTE — Progress Notes (Signed)
Subjective:     Patient ID: Isaac [name], male   DOB: 03/19/1941, 71 y.o.   MRN: [id number]  HPI He is here for his first postop visit status post left groin exploration, excision of chronic scar tissue, and placement of mesh. He has mild postoperative discomfort. It is difficult to tell whether his preoperative pain will resolve Review of Systems     Objective:   Physical Exam On exam, his incision is well-healed there is no evidence of recurrent hernia    Assessment:     Patient stable postop    Plan:     He will refrain from heavy lifting for 2 more weeks. He will then resume all activities. I will see him back as needed. He will come back and see me if his pain has not resolved.

## 2012-03-26 ENCOUNTER — Ambulatory Visit (INDEPENDENT_AMBULATORY_CARE_PROVIDER_SITE_OTHER): Payer: BC Managed Care – PPO | Admitting: Internal Medicine

## 2012-03-26 ENCOUNTER — Encounter: Payer: Self-pay | Admitting: Internal Medicine

## 2012-03-26 VITALS — BP 118/64 | HR 73 | Temp 98.4°F | Ht 70.5 in | Wt 187.0 lb

## 2012-03-26 DIAGNOSIS — R109 Unspecified abdominal pain: Secondary | ICD-10-CM | POA: Diagnosis not present

## 2012-03-26 DIAGNOSIS — I10 Essential (primary) hypertension: Secondary | ICD-10-CM

## 2012-03-26 DIAGNOSIS — E785 Hyperlipidemia, unspecified: Secondary | ICD-10-CM

## 2012-03-26 NOTE — Progress Notes (Signed)
Subjective:    Patient ID: Isaac [name], male    DOB: 12-27-1940   MRN: [id number]    Brief patient profile:  71yowm with h/o IHD/HBP/hyperlipidemia followed here for Primary care.   06/30/2011 f/u ov/Isaac [name] cc mostly dry cough x one month then new onset LLQ abd pain daily not present sleeping but daily waxes and wanes sev hours at a time no obvious trigger with activity or meals/flatus/ bms rec Prednisone 10 mg take  4 each am x 2 days,   2 each am x 2 days,  1 each am x2days and stop For cough use delsym cough syrup and stop fish oil until no longer coughing at all Try prilosec 20mg   Take 30-60 min before first meal of the day and Pepcid 20 mg one bedtime .  GERD diet  07/14/2011 f/u ov/Isaac [name] cough gone but  cc persistent x one month LLQ pain  Rad across ant abd to naval area plus difficulty with urination x 5 days  But no flank pain, hematuria, dysuria or use of otcs/antihistamine.  Abd pain waxes and wanes worse for maybe an hour then better but always present a little and not worse coughing. No pattern of pain in terms of alleviating or exacerbating.  >>CT abd pelvis neg > referred to [facility]/ [name]  10/24/2011 ER follow up  Over last 3 months has had constant LLQ abdominal pain.  Initially CT abd/pelvis done w/ question of return of L sided inguinal hernia -previous hernia repair 30 years ago. Referred to surgeon. Pt was given tramadol for pain . Did not feel this pain was related to hernia .  CT repeated 6/20 essentially unremarkable w/ no sign of diverticulitis, hernia . Small hiatal hernia/?esophagitis. Probable fat necrosis in LU abd ? Significance.  Developed n/v which was new 2 days prior to OV   , seen in ER w/ essentially neg workup  N/v resolved.  Cont w/ Dull ache that is constant. In LLQ -also tender. Pain radiates down into left groin.  No testicle swelling or pain. No urinary symptoms . UA has been unremarkable.  Taking tramadol which helps.  No urinary symptoms , no bloody  stools.  No constipation w/ daily bm. No fever  Gas bloating most days . No GERD symptoms on prilosec.  Pain is better with lying down . No association with food intake.  rec GERD diet  Levsin 0.125mg  Three times a day  As needed  Abdominal pain /spasm > did not take Gas X with meals  We are referring you to GI to evaluate persistent abdominal pai  12/24/2011 f/u ov/Isaac [name] cc n and vomiting gone completely but "constant pain" LLQ (which on further questioning resolves in supine position) " only better p  Tramadol" Seems worse the more he works,  No excess gas, no response to gas x. Neg GI med and surg w/u so considering exploration in Sept. No longer coughing > Sept 27 2013  03/26/2012 f/u ov/Isaac [name] cc abd pain resolved to his satisfaction, here for f/u hbp and hyperlipidemia but has dot physical due next month.  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  Sleeping ok without nocturnal  or early am exacerbation  of respiratory  c/o's or need for noct saba. Also denies any obvious fluctuation of symptoms with weather or environmental changes or other aggravating or alleviating factors except as outlined above   ROS  The following are not active complaints unless bolded sore throat, dysphagia, dental problems, itching,  sneezing,  nasal congestion or excess/ purulent secretions, ear ache,   fever, chills, sweats, unintended wt loss, pleuritic or exertional cp, hemoptysis,  orthopnea pnd or leg swelling, presyncope, palpitations, heartburn, abdominal pain, anorexia, nausea, vomiting, diarrhea  or change in bowel or urinary habits, change in stools or urine, dysuria,hematuria,  rash, arthralgias, visual complaints, headache, numbness weakness or ataxia or problems with walking or coordination,  change in mood/affect or memory.       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  wt   192 April 09, 2009 > 190 July 21, 2009 > 187 03/26/2012      HEENT: Edentulous, nl turbinates, and orophanx. Ext canals ok Neck without JVD/Nodes/TM  Lungs clear to A and P bilaterally without cough on insp or exp maneuvers  RRR no s3 or murmur or increase in P2  Abd soft , no tenderness, BS+ No bruits or organomegaly  EXT no edema, from left ankle, nl pulses  MS nl gait, no restrictions or deformities Neuro intact sensorium, no motor deficits Skin   Few seb Keratoses    06/30/11 cxr No active cardiopulmonary disease.     Assessment & Plan:

## 2012-03-26 NOTE — Patient Instructions (Addendum)
No change in medications  Please schedule a follow up visit in 2  months but call sooner if needed

## 2012-03-26 NOTE — Assessment & Plan Note (Signed)
-   onset mid Feb 2013   - Abd ct  07/16/2011 > wnl > referred to [facility]   - Trial of citrucel 12/25/2011    - Resolved p L Inguinal exploration 01/23/12 with ilioinguinal nerve partially freed up from scar tissue from prev [facility] surgery  Resolved to his satisfaction

## 2012-03-26 NOTE — Assessment & Plan Note (Signed)
-   Target LDL < 70 as has IHD  Lab Results  Component Value Date   LDLCALC 52 06/30/2011     Recheck due at dot physical next month

## 2012-03-26 NOTE — Assessment & Plan Note (Signed)
Adequate control on present rx, reviewed  

## 2012-04-08 ENCOUNTER — Telehealth: Payer: Self-pay | Admitting: Internal Medicine

## 2012-04-08 MED ORDER — MOMETASONE FUROATE 50 MCG/ACT NA SUSP: 2.0000 | Freq: Two times a day (BID) | NASAL | Status: DC

## 2012-04-08 NOTE — Telephone Encounter (Signed)
Rx has been sent in, pt is aware. 

## 2012-05-11 ENCOUNTER — Ambulatory Visit (INDEPENDENT_AMBULATORY_CARE_PROVIDER_SITE_OTHER): Payer: Medicare Other | Admitting: Internal Medicine

## 2012-05-11 ENCOUNTER — Encounter: Payer: Self-pay | Admitting: Internal Medicine

## 2012-05-11 VITALS — BP 179/84 | HR 77 | Ht 70.5 in | Wt 186.4 lb

## 2012-05-11 DIAGNOSIS — I259 Chronic ischemic heart disease, unspecified: Secondary | ICD-10-CM

## 2012-05-11 DIAGNOSIS — I10 Essential (primary) hypertension: Secondary | ICD-10-CM

## 2012-05-11 NOTE — Assessment & Plan Note (Signed)
His blood pressure is elevated today. Based on the results of his stress test, he will likely need a change in his blood pressure medications.: Anticipate discontinuing his atenolol and starting carvedilol.

## 2012-05-11 NOTE — Progress Notes (Signed)
HPI [name] returns today for followup. 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 In the interim, he has done well. He denies chest pain shortness of breath or peripheral edema. No syncope. He has had no ischemic evaluation for several years. 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 No Known Allergies   Current Outpatient Prescriptions  Medication Sig Dispense Refill  . aspirin (ADULT ASPIRIN EC LOW STRENGTH) 81 MG EC tablet Take 81 mg by mouth every morning. Will stop 5 days pre op      . atenolol-chlorthalidone (TENORETIC) 50-25 MG per tablet Take 1 tablet by mouth daily after breakfast.       . Cholecalciferol (VITAMIN D) 2000 UNITS tablet Take 2,000 Units by mouth daily.       . clotrimazole-betamethasone (LOTRISONE) cream Apply 1 application topically 2 (two) times daily as needed. Apply to rash      . doxazosin (CARDURA) 1 MG tablet Take 1 mg by mouth daily after breakfast.       . mometasone (NASONEX) 50 MCG/ACT nasal spray Place 2 sprays into the nose 2 (two) times daily.  17 g  2  . Multiple Vitamin (MULTIVITAMIN WITH MINERALS) TABS Take 1 tablet by mouth daily. Will stop  5 days pre op      . omeprazole (PRILOSEC) 20 MG capsule Take 20 mg by mouth 2 (two) times daily.       [name] oxymetazoline (AFRIN) 0.05 % nasal spray Place 2 sprays into the nose as needed. Dryness      . simvastatin (ZOCOR) 20 MG tablet Take 20 mg by mouth at bedtime.       . ondansetron (ZOFRAN-ODT) 8 MG disintegrating tablet Take 8 mg by mouth every 8 (eight) hours as needed.          Past Medical History  Diagnosis Date  . Ischemic heart disease   . Arthralgia   . Hypertension   . Hyperlipidemia   . Allergic rhinitis   . Health maintenance examination     Td 2/06; Pneumovax 12/06; CPX 07/04/10; [name]  . Arthritis   . GERD (gastroesophageal  reflux disease)   . Heart attack   . Left groin pain 08/07/2011  . H/O hiatal hernia   . History of skin cancer     bilateral arms with removal    ROS:   All systems reviewed and negative except as noted in the HPI.   Past Surgical History  Procedure Date  . Coronary artery bypass graft 2002  . Inguinal hernia repair 1983    left  . Nose surgery 1985  . Colonoscopy   . Eye surgery     bilateral cataract extraction with  IOL  . Groin dissection 01/23/2012    Procedure: GROIN EXPLORATION;  Surgeon: Shelly Rubenstein, MD;  Location: [facility];  Service: General;  Laterality: Left;  Left Inguinal Exploration, release of scar tissue, Placement of Mesh     Family History  Problem Relation Age of Onset  . Breast cancer Mother   . Diabetes Mother   . Hypertension Father   . Dementia Father   . Diabetes Sister   . COPD Brother   . Colon cancer Neg Hx      History   Social History  . Marital Status: Married    Spouse Name: N/A    Number  of Children: 0  . Years of Education: N/A   Occupational History  . retired    Social History Main Topics  . Smoking status: Never Smoker   . Smokeless tobacco: Current User    Types: Chew  . Alcohol Use: No  . Drug Use: No  . Sexually Active: Not on file   Other Topics Concern  . Not on file   Social History Narrative   Remarried after first wife died of emphysema in 09-09-01     BP 179/84  Pulse 77  Ht 5' 10.5" (1.791 m)  Wt 186 lb 6.4 oz (84.55 kg)  BMI 26.37 kg/m2  Physical Exam:  Well appearing 72 year old man, NAD HEENT: Unremarkable Neck:  7 cm JVD, no thyromegally Lungs:  Clear with no wheezes rales or rhonchi. HEART:  Regular rate rhythm, no murmurs, no rubs, no clicks Abd:  soft, positive bowel sounds, no organomegally, no rebound, no guarding Ext:  2 plus pulses, no edema, no cyanosis, no clubbing Skin:  No rashes no nodules Neuro:  CN II through XII intact, motor grossly intact   Assess/Plan:

## 2012-05-11 NOTE — Assessment & Plan Note (Signed)
He denies anginal symptoms. He will undergo exercise treadmill testing as she has not had an ischemic evaluation for several years.

## 2012-05-11 NOTE — Patient Instructions (Signed)
Your physician has requested that you have an exercise tolerance test. For further information please visit [email or web address]. Please also follow instruction sheet, as given.   

## 2012-05-13 DIAGNOSIS — H2510 Age-related nuclear cataract, unspecified eye: Secondary | ICD-10-CM | POA: Diagnosis not present

## 2012-05-20 ENCOUNTER — Ambulatory Visit (INDEPENDENT_AMBULATORY_CARE_PROVIDER_SITE_OTHER): Payer: Medicare Other | Admitting: Nurse Practitioner

## 2012-05-20 DIAGNOSIS — I259 Chronic ischemic heart disease, unspecified: Secondary | ICD-10-CM

## 2012-05-20 NOTE — Progress Notes (Signed)
Exercise Treadmill Test  Pre-Exercise Testing Evaluation Rhythm: normal sinus  Rate: 73     Test  Exercise Tolerance Test Ordering MD: Lewayne Bunting, MD  Interpreting MD: Nicolasa Ducking NP  Unique Test No: 1  Treadmill:  1  Indication for ETT: CAD  Contraindication to ETT: No   Stress Modality: exercise - treadmill  Cardiac Imaging Performed: non   Protocol: standard Bruce - maximal  Max BP:234/65  Max MPHR (bpm):  149 85% MPR (bpm): 127  MPHR obtained (bpm):137 % MPHR obtained: 91  Reached 85% MPHR (min:sec): 2:57 Total Exercise Time (min-sec): 5:00  Workload in METS:  4.8 Borg Scale: 13  Reason ETT Terminated:  dyspnea    ST Segment Analysis At Rest: normal ST segments - no evidence of significant ST depression With Exercise: significant ischemic ST depression  Other Information Arrhythmia:  occasional PVC's Angina during ETT:  absent (0) Quality of ETT:  diagnostic  ETT Interpretation:  abnormal - evidence of ST depression consistent with ischemia  Comments: Pt unable to progress to stage II but was able to complete 5 mins in stage I with achievement of target HR.  He did not have chest pain but reported dyspnea and had a htn response to exercise. In exercise and recovery, he had anterolateral ST depression with T changes.  Recommendations: ECG's reviewed with Dr. Patty Sermons.  In the absence of chest pain, we will arrange for exercise myoview next week.

## 2012-05-21 ENCOUNTER — Ambulatory Visit (INDEPENDENT_AMBULATORY_CARE_PROVIDER_SITE_OTHER): Payer: Medicare Other | Admitting: Internal Medicine

## 2012-05-21 ENCOUNTER — Encounter: Payer: Self-pay | Admitting: Internal Medicine

## 2012-05-21 VITALS — BP 130/74 | HR 67 | Temp 97.9°F | Ht 70.5 in | Wt 187.0 lb

## 2012-05-21 DIAGNOSIS — M2550 Pain in unspecified joint: Secondary | ICD-10-CM | POA: Diagnosis not present

## 2012-05-21 DIAGNOSIS — E785 Hyperlipidemia, unspecified: Secondary | ICD-10-CM | POA: Diagnosis not present

## 2012-05-21 DIAGNOSIS — I10 Essential (primary) hypertension: Secondary | ICD-10-CM

## 2012-05-21 NOTE — Progress Notes (Signed)
Subjective:    Patient ID: Isaac [name], male    DOB: May 08, 1940   MRN: [id number]    Brief patient profile:  71yowm with h/o IHD/HBP/hyperlipidemia followed here for Primary care.   06/30/2011 f/u ov/Isaac [name] cc mostly dry cough x one month then new onset LLQ abd pain daily not present sleeping but daily waxes and wanes sev hours at a time no obvious trigger with activity or meals/flatus/ bms rec Prednisone 10 mg take  4 each am x 2 days,   2 each am x 2 days,  1 each am x2days and stop For cough use delsym cough syrup and stop fish oil until no longer coughing at all Try prilosec 20mg   Take 30-60 min before first meal of the day and Pepcid 20 mg one bedtime .  GERD diet  07/14/2011 f/u ov/Isaac [name] cough gone but  cc persistent x one month LLQ pain  Rad across ant abd to naval area plus difficulty with urination x 5 days  But no flank pain, hematuria, dysuria or use of otcs/antihistamine.  Abd pain waxes and wanes worse for maybe an hour then better but always present a little and not worse coughing. No pattern of pain in terms of alleviating or exacerbating.  >>CT abd pelvis neg > referred to [name]> corrected with L Inguinal sugery 12/2011     05/21/2012 f/u ov/Isaac [name] for f/u hbp cc fell off ladder x one month, better on nsaids still working remodeling, no cp or sob tia or claudication symptoms  Sleeping ok without nocturnal  or early am exacerbation  of respiratory  c/o's or need for noct saba. Also denies any obvious fluctuation of symptoms with weather or environmental changes or other aggravating or alleviating factors except as outlined above   ROS  The following are not active complaints unless bolded sore throat, dysphagia, dental problems, itching, sneezing,  nasal congestion or excess/ purulent secretions, ear ache,   fever, chills, sweats, unintended wt loss, pleuritic or exertional cp, hemoptysis,  orthopnea pnd or leg swelling, presyncope, palpitations, heartburn, abdominal pain,  anorexia, nausea, vomiting, diarrhea  or change in bowel or urinary habits, change in stools or urine, dysuria,hematuria,  rash, arthralgias, visual complaints, headache, numbness weakness or ataxia or problems with walking or coordination,  change in mood/affect or memory.       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 [name] (presented as sudden death) ARTHRALGIA (ICD-719.40)  HYPERTENSION (ICD-401.9)  HYPERLIPIDEMIA (ICD-272.4)  ALLERGIC RHINITIS (ICD-477.9)  L IH repair 1984 GERD/ Barrett's............................................................................[name] [name] LLQ abd pain onset 05/2011...........................................................[name] [name] -  01/23/12 scar tissue removed > pain resolved  HEALTH MAINTENANCE.................................................................Marland KitchenWert  -Td 05/2004 -Pneumovax 03/2005  -CPX 06/30/2011 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  wt   192 April 09, 2009 > 190 July 21, 2009 > 187 03/26/2012     > 05/21/2012 187 HEENT: Edentulous, nl turbinates, and orophanx. Ext canals ok Neck without JVD/Nodes/TM  Lungs clear to A and P bilaterally without cough on insp or exp maneuvers  RRR no s3 or murmur or increase in P2  Abd soft , no tenderness, BS+ No bruits or organomegaly  EXT no edema, from left ankle, nl pulses  MS nl gait, no restrictions or deformities, shoulders full rom bilaterally Neuro intact sensorium, no motor deficits Skin   Few  seb Keratoses    06/30/11 cxr No active cardiopulmonary disease.     Assessment & Plan:

## 2012-05-21 NOTE — Patient Instructions (Addendum)
Return 3 months for physical - call sooner if needed

## 2012-05-22 NOTE — Assessment & Plan Note (Addendum)
Controlled with prn nsaids > to see [name] if not better to his satisfaction with conservative rx

## 2012-05-22 NOTE — Assessment & Plan Note (Addendum)
-   Target LDL < 70 as has IHD  Due f/u yearly  cpx p 06/2012

## 2012-05-22 NOTE — Assessment & Plan Note (Signed)
Adequate control on present rx, reviewed  

## 2012-05-27 ENCOUNTER — Ambulatory Visit (HOSPITAL_COMMUNITY): Payer: Medicare Other | Attending: Internal Medicine | Admitting: Radiology

## 2012-05-27 VITALS — BP 152/81 | HR 64 | Ht 70.5 in | Wt 187.0 lb

## 2012-05-27 DIAGNOSIS — I10 Essential (primary) hypertension: Secondary | ICD-10-CM | POA: Insufficient documentation

## 2012-05-27 DIAGNOSIS — R0989 Other specified symptoms and signs involving the circulatory and respiratory systems: Secondary | ICD-10-CM | POA: Diagnosis not present

## 2012-05-27 DIAGNOSIS — R9439 Abnormal result of other cardiovascular function study: Secondary | ICD-10-CM

## 2012-05-27 DIAGNOSIS — I4949 Other premature depolarization: Secondary | ICD-10-CM | POA: Diagnosis not present

## 2012-05-27 DIAGNOSIS — Z8249 Family history of ischemic heart disease and other diseases of the circulatory system: Secondary | ICD-10-CM | POA: Insufficient documentation

## 2012-05-27 DIAGNOSIS — I259 Chronic ischemic heart disease, unspecified: Secondary | ICD-10-CM

## 2012-05-27 DIAGNOSIS — I2510 Atherosclerotic heart disease of native coronary artery without angina pectoris: Secondary | ICD-10-CM

## 2012-05-27 DIAGNOSIS — R0609 Other forms of dyspnea: Secondary | ICD-10-CM | POA: Insufficient documentation

## 2012-05-27 MED ORDER — TECHNETIUM TC 99M SESTAMIBI GENERIC - CARDIOLITE
11.0000 | Freq: Once | INTRAVENOUS | Status: AC | PRN
  Administered 2012-05-27: 11 via INTRAVENOUS

## 2012-05-27 MED ORDER — TECHNETIUM TC 99M SESTAMIBI GENERIC - CARDIOLITE
33.0000 | Freq: Once | INTRAVENOUS | Status: AC | PRN
  Administered 2012-05-27: 33 via INTRAVENOUS

## 2012-05-27 NOTE — Progress Notes (Signed)
[facility] SITE 3 NUCLEAR MED [address] [place], [place] [address] [phone]    Cardiology Nuclear Med Study  Isaac [name] is a 72 y.o. male     MRN : [id number]     DOB: [birth year]  Procedure Date: 05/27/2012  Nuclear Med Background Indication for Stress Test:  Evaluation for Ischemia, Graft Patency and Abnormal GXT History:  '02 OZH:YQMVHQ ischemia>CABG; 05/20/12 ION:[id number] Cardiac Risk Factors: Family History - CAD, Hypertension and Lipids  Symptoms:  DOE   Nuclear Pre-Procedure Caffeine/Decaff Intake:  None NPO After: 5:30am   Lungs:  Clear. O2 Sat: 97% on room air. IV 0.9% NS with Angio Cath:  20g  IV Site: R Antecubital  IV Started by:  Stanton Kidney, EMT-P  Chest Size (in):  44 Cup Size: n/a  Height: 5' 10.5" (1.791 m)  Weight:  187 lb (84.823 kg)  BMI:  Body mass index is 26.45 kg/(m^2). Tech Comments:  Tenoretic held > 24 hours, per patient.    Nuclear Med Study 1 or 2 day study: 1 day  Stress Test Type:  Stress  Reading MD: Dietrich Pates, MD  Order Authorizing Provider:  Lewayne Bunting, MD; Ward Givens, NP  Resting Radionuclide: Technetium 83m Sestamibi  Resting Radionuclide Dose: 10.9 mCi   Stress Radionuclide:  Technetium 60m Sestamibi  Stress Radionuclide Dose: 33.0 mCi           Stress Protocol Rest HR: 64 Stress HR: 131  Rest BP: 152/81 Stress BP: 230/72  Exercise Time (min): 7:07 METS: 7.9   Predicted Max HR: 149 bpm % Max HR: 87.92 bpm Rate Pressure Product: 32440    Dose of Adenosine (mg):  n/a Dose of Lexiscan: n/a mg  Dose of Atropine (mg): n/a Dose of Dobutamine: n/a mcg/kg/min (at max HR)  Stress Test Technologist: Smiley Houseman, CMA-N  Nuclear Technologist:  Doyne Keel, CNMT     Rest Procedure:  Myocardial perfusion imaging was performed at rest 45 minutes following the intravenous administration of Technetium 61m Sestamibi.  Rest ECG: NSR - Normal EKG  Stress Procedure:  The patient exercised on the treadmill utilizing  the Bruce Protocol for 7:07 minutes. The patient stopped due to moderate dyspnea with peak O2 SAT of 83%.   He denied any chest pain.  Technetium 6m Sestamibi was injected at peak exercise and myocardial perfusion imaging was performed after a brief delay.  Stress ECG: 1-2 mm flat ST depression in leads V4 to V6 in stage III.  Frequent pvcs in recovery period.  ST changes near normalized by the end of recovery period.  QPS Raw Data Images:  Images were motion corrected.  Soft tissue (diaphragm) underlies heart. Stress Images:  Defect in the inferoseptal wall (base,mid), inferior wall (base, minimally mid).  Otherwise normal perfusion. Rest Images:  minimal change from the stress images. Subtraction (SDS):  Borderline for ischemia Transient Ischemic Dilatation (Normal <1.22):  1.14 Lung/Heart Ratio (Normal <0.45):  0.31  Quantitative Gated Spect Images QGS EDV:  71 ml QGS ESV:  17 ml  Impression Exercise Capacity:  Good exercise capacity. BP Response:  Hypertensive blood pressure response. Clinical Symptoms:  No chest pain. ECG Impression:  Significant ST abnormalities consistent with ischemia.  See above. Comparison with Prior Nuclear Study: Only apical ischemia noted on previous report which is not present.  Overall Impression:  Clinically negative, electrically positive for ischemia.  Note frequent PVCs in recovery.   Myoview scan with small region of inferior/inferoseptal scar and possible soft tissue  attenuation.  No significant ischemia.   LV Ejection Fraction: 54%.  LV Wall Motion:  Mild basal inferior hypokinesis.  Dietrich Pates

## 2012-05-31 ENCOUNTER — Encounter: Payer: Medicare Other | Admitting: Physician Assistant

## 2012-06-01 ENCOUNTER — Ambulatory Visit (INDEPENDENT_AMBULATORY_CARE_PROVIDER_SITE_OTHER): Payer: Medicare Other | Admitting: Physician Assistant

## 2012-06-01 ENCOUNTER — Encounter: Payer: Self-pay | Admitting: Physician Assistant

## 2012-06-01 VITALS — BP 158/84 | HR 67 | Ht 70.5 in | Wt 191.0 lb

## 2012-06-01 DIAGNOSIS — I10 Essential (primary) hypertension: Secondary | ICD-10-CM | POA: Diagnosis not present

## 2012-06-01 DIAGNOSIS — I2510 Atherosclerotic heart disease of native coronary artery without angina pectoris: Secondary | ICD-10-CM

## 2012-06-01 DIAGNOSIS — E785 Hyperlipidemia, unspecified: Secondary | ICD-10-CM | POA: Diagnosis not present

## 2012-06-01 MED ORDER — AMLODIPINE BESYLATE 2.5 MG PO TABS: 2.5000 mg | ORAL_TABLET | Freq: Every day | ORAL | Status: DC

## 2012-06-01 NOTE — Progress Notes (Signed)
[address], [address] [place], [place]  [address] Phone: [phone], Fax:  [phone]  Date:  06/01/2012   ID:  Herschel, Fleagle November 06, 1940, MRN [id number]  PCP:  Sandrea Hughs, MD  Primary Cardiologist:  Dr. Lewayne Bunting    History of Present Illness: Isaac [name] is a 72 y.o. male who returns for follow up after recent stress test.  He has a hx of CAD, s/p CABG in 2002, HTN, HL. Last seen by Dr. [name] 05/11/12. 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 ETT 05/20/12: demonstrated hypertensive blood pressure response with anterolateral ST depression and T wave changes. Patient was therefore set up for a stress Myoview. ETT Myoview 05/27/12: Positive ST changes consistent with ischemia, EF 54%, inferior HK, small region of inferior/inferoseptal scar and possible soft tissue attenuation, no significant ischemia.  Patient is doing well.  No chest pain, dyspnea, syncope, orthopnea, PND, edema.  He is very active on his farm.  No limitations.    Labs (9/13):  Hgb 13.5, K 3.8, creatinine 1.10  Wt Readings from Last 3 Encounters:  06/01/12 191 lb (86.637 kg)  05/27/12 187 lb (84.823 kg)  05/21/12 187 lb (84.823 kg)     Past Medical History  Diagnosis Date  . Ischemic heart disease   . Arthralgia   . Hypertension   . Hyperlipidemia   . Allergic rhinitis   . Health maintenance examination     Td 2/06; Pneumovax 12/06; CPX 07/04/10; [name]  . Arthritis   . GERD (gastroesophageal reflux disease)   . Heart attack   . Left groin pain 08/07/2011  . H/O hiatal hernia   . History of skin cancer     bilateral arms with removal    Current Outpatient Prescriptions  Medication Sig Dispense Refill  . aspirin (ADULT ASPIRIN EC LOW STRENGTH) 81 MG EC tablet Take 81 mg by mouth every morning. Will stop 5 days pre op      . atenolol-chlorthalidone (TENORETIC) 50-25 MG per tablet Take 1 tablet by mouth daily after breakfast.        . Cholecalciferol (VITAMIN D) 2000 UNITS tablet Take 2,000 Units by mouth daily.       . clotrimazole-betamethasone (LOTRISONE) cream Apply 1 application topically 2 (two) times daily as needed. Apply to rash      . doxazosin (CARDURA) 1 MG tablet Take 1 mg by mouth daily after breakfast.       . mometasone (NASONEX) 50 MCG/ACT nasal spray Place 2 sprays into the nose 2 (two) times daily.  17 g  2  . Multiple Vitamin (MULTIVITAMIN WITH MINERALS) TABS Take 1 tablet by mouth daily. Will stop  5 days pre op      . naproxen sodium (ANAPROX) 220 MG tablet Take 440 mg by mouth 2 (two) times daily with a meal.       . omeprazole (PRILOSEC) 20 MG capsule Take 20 mg by mouth 2 (two) times daily.       [name] oxymetazoline (AFRIN) 0.05 % nasal spray Place 2 sprays into the nose as needed. Dryness      . simvastatin (ZOCOR) 20 MG tablet Take 20 mg by mouth at bedtime.         Allergies:   No Known Allergies  Social History:  The patient  reports that he has never smoked. His smokeless tobacco use includes Chew. He reports that he does not drink alcohol  or use illicit drugs.   ROS:  Please see the history of present illness.   All other systems reviewed and negative.   PHYSICAL EXAM: VS:  BP 158/84  Pulse 67  Ht 5' 10.5" (1.791 m)  Wt 191 lb (86.637 kg)  BMI 27.02 kg/m2 Well nourished, well developed, in no acute distress HEENT: normal Neck: no JVD Cardiac:  normal S1, S2; RRR; no murmur Lungs:  clear to auscultation bilaterally, no wheezing, rhonchi or rales Abd: soft, nontender, no hepatomegaly Ext: no edema Skin: warm and dry Neuro:  CNs 2-12 intact, no focal abnormalities noted  EKG:  NSR, HR 68, NSSTTW changes     ASSESSMENT AND PLAN:  1. Coronary Artery Disease:  No angina.  Low risk myoview.  Continue medical Rx. Continue ASA, statin.   2. Hypertension:  Above goal.  Add Amlodipine 2.5 mg QD.  3. Hyperlipidemia:  primarily  4. Disposition:  Follow up with Dr. Lewayne Bunting in 6  mos.  Luna Glasgow, PA-C  11:20 AM 06/01/2012

## 2012-06-01 NOTE — Patient Instructions (Addendum)
START AMLODIPINE 2.5 MG DAILY, RX WAS SENT IN TODAY  Your physician wants you to follow-up in: 6 MONTHS WITH DR. [name]. You will receive a reminder letter in the mail two months in advance. If you don't  receive a letter, please call our office to schedule the follow-up appointment.  YOU HAVE BEEN GIVEN A COPY OF YOU STRESS TEST PER YOUR REQUEST

## 2012-07-16 DIAGNOSIS — M25519 Pain in unspecified shoulder: Secondary | ICD-10-CM | POA: Diagnosis not present

## 2012-07-20 ENCOUNTER — Other Ambulatory Visit: Payer: Self-pay | Admitting: Internal Medicine

## 2012-07-22 DIAGNOSIS — M19019 Primary osteoarthritis, unspecified shoulder: Secondary | ICD-10-CM | POA: Diagnosis not present

## 2012-07-26 DIAGNOSIS — M75120 Complete rotator cuff tear or rupture of unspecified shoulder, not specified as traumatic: Secondary | ICD-10-CM | POA: Diagnosis not present

## 2012-08-02 ENCOUNTER — Telehealth: Payer: Self-pay | Admitting: Internal Medicine

## 2012-08-02 NOTE — Telephone Encounter (Signed)
New problem   Isaac [name]/[facility] want to know if you received fax for cardiac clearance she fax on 07/26/12.Pt is having sx 08/05/12. Please call Darl Pikes concerning this matter.

## 2012-08-02 NOTE — Telephone Encounter (Signed)
Note faxed on 4/3 and refaxing today  Spoke with Darl Pikes she is aware

## 2012-08-05 DIAGNOSIS — M25819 Other specified joint disorders, unspecified shoulder: Secondary | ICD-10-CM | POA: Diagnosis not present

## 2012-08-05 DIAGNOSIS — M898X9 Other specified disorders of bone, unspecified site: Secondary | ICD-10-CM | POA: Diagnosis not present

## 2012-08-05 DIAGNOSIS — S43429A Sprain of unspecified rotator cuff capsule, initial encounter: Secondary | ICD-10-CM | POA: Diagnosis not present

## 2012-08-05 DIAGNOSIS — G8918 Other acute postprocedural pain: Secondary | ICD-10-CM | POA: Diagnosis not present

## 2012-08-05 DIAGNOSIS — M75110 Incomplete rotator cuff tear or rupture of unspecified shoulder, not specified as traumatic: Secondary | ICD-10-CM | POA: Diagnosis not present

## 2012-08-16 DIAGNOSIS — M25819 Other specified joint disorders, unspecified shoulder: Secondary | ICD-10-CM | POA: Diagnosis not present

## 2012-08-22 ENCOUNTER — Other Ambulatory Visit: Payer: Self-pay | Admitting: Internal Medicine

## 2012-09-06 ENCOUNTER — Ambulatory Visit (INDEPENDENT_AMBULATORY_CARE_PROVIDER_SITE_OTHER)
Admission: RE | Admit: 2012-09-06 | Discharge: 2012-09-06 | Disposition: A | Discharge status: Home / Self Care | Payer: Medicare Other | Source: Ambulatory Visit | Attending: Internal Medicine | Admitting: Internal Medicine

## 2012-09-06 ENCOUNTER — Other Ambulatory Visit (INDEPENDENT_AMBULATORY_CARE_PROVIDER_SITE_OTHER): Payer: Medicare Other

## 2012-09-06 ENCOUNTER — Ambulatory Visit (INDEPENDENT_AMBULATORY_CARE_PROVIDER_SITE_OTHER): Payer: Medicare Other | Admitting: Internal Medicine

## 2012-09-06 ENCOUNTER — Encounter: Payer: Self-pay | Admitting: Internal Medicine

## 2012-09-06 VITALS — BP 132/82 | HR 67 | Temp 97.6°F | Ht 70.0 in | Wt 189.0 lb

## 2012-09-06 DIAGNOSIS — M2550 Pain in unspecified joint: Secondary | ICD-10-CM

## 2012-09-06 DIAGNOSIS — J309 Allergic rhinitis, unspecified: Secondary | ICD-10-CM

## 2012-09-06 DIAGNOSIS — N401 Enlarged prostate with lower urinary tract symptoms: Secondary | ICD-10-CM | POA: Diagnosis not present

## 2012-09-06 DIAGNOSIS — E785 Hyperlipidemia, unspecified: Secondary | ICD-10-CM

## 2012-09-06 DIAGNOSIS — I10 Essential (primary) hypertension: Secondary | ICD-10-CM

## 2012-09-06 DIAGNOSIS — N138 Other obstructive and reflux uropathy: Secondary | ICD-10-CM

## 2012-09-06 DIAGNOSIS — Z0000 Encounter for general adult medical examination without abnormal findings: Secondary | ICD-10-CM | POA: Diagnosis not present

## 2012-09-06 LAB — HEPATIC FUNCTION PANEL
ALT: 20 U/L (ref 0–53)
AST: 28 U/L (ref 0–37)
Albumin: 3.8 g/dL (ref 3.5–5.2)
Alkaline Phosphatase: 75 U/L (ref 39–117)
Total Protein: 8.2 g/dL (ref 6.0–8.3)

## 2012-09-06 LAB — URINALYSIS
Ketones, ur: NEGATIVE
Leukocytes, UA: NEGATIVE
Nitrite: NEGATIVE
Specific Gravity, Urine: 1.01 (ref 1.000–1.030)
pH: 8.5 (ref 5.0–8.0)

## 2012-09-06 LAB — TSH: TSH: 3.16 u[IU]/mL (ref 0.35–5.50)

## 2012-09-06 LAB — CBC WITH DIFFERENTIAL/PLATELET
Basophils Relative: 0.5 % (ref 0.0–3.0)
Eosinophils Absolute: 0.2 10*3/uL (ref 0.0–0.7)
Hemoglobin: 13.7 g/dL (ref 13.0–17.0)
Lymphs Abs: 2 10*3/uL (ref 0.7–4.0)
MCHC: 33.8 g/dL (ref 30.0–36.0)
MCV: 93.9 fl (ref 78.0–100.0)
Monocytes Absolute: 1 10*3/uL (ref 0.1–1.0)
Neutro Abs: 6.3 10*3/uL (ref 1.4–7.7)
Neutrophils Relative %: 65.5 % (ref 43.0–77.0)
RBC: 4.33 Mil/uL (ref 4.22–5.81)

## 2012-09-06 LAB — BASIC METABOLIC PANEL
GFR: 58.23 mL/min — ABNORMAL LOW (ref >60.00)
Glucose, Bld: 106 mg/dL — ABNORMAL HIGH (ref 70–99)
Potassium: 3.8 mEq/L (ref 3.5–5.1)
Sodium: 139 mEq/L (ref 135–145)

## 2012-09-06 LAB — LIPID PANEL: VLDL: 17.4 mg/dL (ref 0.0–40.0)

## 2012-09-06 NOTE — Patient Instructions (Addendum)
Please remember to go to the lab and x-ray department downstairs for your tests - we will call you with the results when they are available.     Please schedule a follow up visit in 3 months but call sooner if needed - we need to see you a week before you run out of your cardura and norvasc to change your presciptions

## 2012-09-06 NOTE — Progress Notes (Signed)
Subjective:    Patient ID: Isaac [name], male    DOB: 04-10-1941   MRN: [id number]    Brief patient profile:  71yowm with h/o IHD/HBP/hyperlipidemia followed here for Primary care.   06/30/2011 f/u ov/Isaac [name] cc mostly dry cough x one month then new onset LLQ abd pain daily not present sleeping but daily waxes and wanes sev hours at a time no obvious trigger with activity or meals/flatus/ bms rec Prednisone 10 mg take  4 each am x 2 days,   2 each am x 2 days,  1 each am x2days and stop For cough use delsym cough syrup and stop fish oil until no longer coughing at all Try prilosec 20mg   Take 30-60 min before first meal of the day and Pepcid 20 mg one bedtime .  GERD diet  07/14/2011 f/u ov/Isaac [name] cough gone but  cc persistent x one month LLQ pain  Rad across ant abd to naval area plus difficulty with urination x 5 days  But no flank pain, hematuria, dysuria or use of otcs/antihistamine.  Abd pain waxes and wanes worse for maybe an hour then better but always present a little and not worse coughing. No pattern of pain in terms of alleviating or exacerbating.  >>CT abd pelvis neg > referred to [facility]/ [name]> corrected with L Inguinal sugery 12/2011     05/21/2012 f/u ov/Isaac [name] for f/u hbp cc fell off ladder x one month, better on nsaids still working remodeling, no cp or sob tia or claudication symptoms rec No change rx   August 05 2012 R shoulder surgery outpt  09/06/2012 f/u ov/Isaac [name] comprehensive yearly eval for multiple med problems Chief Complaint  Patient presents with  . Annual Exam    Had fall x approx 5 months ago- pt c/o right shoulder pain.    no sob or cp, not very active due to shoulder  Sleeping ok without nocturnal  or early am exacerbation  of respiratory  c/o's  . Also denies any obvious fluctuation of symptoms with weather or environmental changes or other aggravating or alleviating factors except as outlined above   Current Medications, Allergies,   Family History, and Social  History were reviewed in [employer] record.  ROS  The following are not active complaints unless bolded sore throat, dysphagia, dental problems, itching, sneezing,  nasal congestion or excess/ purulent secretions, ear ache,   fever, chills, sweats, unintended wt loss, pleuritic or exertional cp, hemoptysis,  orthopnea pnd or leg swelling, presyncope, palpitations, heartburn, abdominal pain, anorexia, nausea, vomiting, diarrhea  or change in bowel or urinary habits, change in stools or urine, dysuria,hematuria,  rash, arthralgias, visual complaints, headache, numbness weakness or ataxia or problems with walking or coordination,  change in mood/affect or memory.         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 Isaac [name] (presented as sudden death) ARTHRALGIA (ICD-719.40)  HYPERTENSION (ICD-401.9)  HYPERLIPIDEMIA (ICD-272.4)  ALLERGIC RHINITIS (ICD-477.9)  L IH repair 1984 GERD/ Barrett's............................................................................Isaac [name] [name] LLQ abd pain onset 05/2011...........................................................Isaac [name] [name] -  01/23/12 scar tissue removed > pain resolved  HEALTH MAINTENANCE.................................................................Isaac KitchenWert  -Td 05/2004 -Pneumovax 03/2005  -CPX 09/06/2012  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.........................................Isaac KitchenOlin             Objective:   Physical Exam      Ambulatory healthy appearing in no acute distress with nl vital signs  wt   192 April 09, 2009 > 190 July 21, 2009 > 187 03/26/2012  >  05/21/2012 187 > 09/06/2012  189  HEENT: Edentulous, nl turbinates, and orophanx. Ext canals full of wax bilaterally  Neck without JVD/Nodes/TM  Lungs clear to A and P bilaterally without cough on insp or exp maneuvers  RRR no s3 or murmur or increase in P2   LLE  pulses slt reduced vs R  Abd  soft , no tenderness, BS+ No bruits or organomegaly  EXT no edema, from left ankle, nl pulses  MS nl gait, R shoulder in Sling no restrictions or deformities,  Neuro intact sensorium, no motor deficits GU testes down bilat, no IH, L Inguinal scar Mod bph, slt assym on R, no nodules, stool G neg Skin   Few seb Keratoses    CXR  09/06/2012 :     No acute cardiopulmonary abnormality.   Assessment & Plan:

## 2012-09-07 NOTE — Assessment & Plan Note (Signed)
-   Target LDL < 70 as has IHD  Lab Results  Component Value Date   CHOL 125 09/06/2012   HDL 47.40 09/06/2012   LDLCALC 60 09/06/2012   TRIG 87.0 09/06/2012   CHOLHDL 3 09/06/2012     Adequate control on present rx, reviewed

## 2012-09-07 NOTE — Assessment & Plan Note (Signed)
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  Discussed prostate screening > age 72 in pt with bph on exam and neg fm hx > he declines PSA

## 2012-09-07 NOTE — Assessment & Plan Note (Signed)
S/p shoulder surgery April 2014 still very restricted in terms of desired goal > house remodeling, which may not be practical at age 72

## 2012-09-07 NOTE — Assessment & Plan Note (Signed)
Should try to consolidate his bp meds for one vasodilator and since needs cardura for prostate will consider consolidating this to higher dose cardura prior to next refill

## 2012-09-14 DIAGNOSIS — M25819 Other specified joint disorders, unspecified shoulder: Secondary | ICD-10-CM | POA: Diagnosis not present

## 2012-09-14 DIAGNOSIS — S43429A Sprain of unspecified rotator cuff capsule, initial encounter: Secondary | ICD-10-CM | POA: Diagnosis not present

## 2012-09-15 ENCOUNTER — Telehealth: Payer: Self-pay | Admitting: Internal Medicine

## 2012-09-15 NOTE — Progress Notes (Signed)
Quick Note:  Spoke with pt and notified of results per Dr. [name]. Pt verbalized understanding and denied any questions.  ______ 

## 2012-09-15 NOTE — Telephone Encounter (Signed)
Spoke with pt and notified of results per Dr. [name]. Pt verbalized understanding and denied any questions. 

## 2012-09-15 NOTE — Telephone Encounter (Signed)
Labs are are all ok

## 2012-09-21 DIAGNOSIS — S43429A Sprain of unspecified rotator cuff capsule, initial encounter: Secondary | ICD-10-CM | POA: Diagnosis not present

## 2012-09-23 DIAGNOSIS — M25819 Other specified joint disorders, unspecified shoulder: Secondary | ICD-10-CM | POA: Diagnosis not present

## 2012-09-23 DIAGNOSIS — S43429A Sprain of unspecified rotator cuff capsule, initial encounter: Secondary | ICD-10-CM | POA: Diagnosis not present

## 2012-09-27 DIAGNOSIS — M25819 Other specified joint disorders, unspecified shoulder: Secondary | ICD-10-CM | POA: Diagnosis not present

## 2012-09-27 DIAGNOSIS — S43429A Sprain of unspecified rotator cuff capsule, initial encounter: Secondary | ICD-10-CM | POA: Diagnosis not present

## 2012-09-29 DIAGNOSIS — S43429A Sprain of unspecified rotator cuff capsule, initial encounter: Secondary | ICD-10-CM | POA: Diagnosis not present

## 2012-09-29 DIAGNOSIS — M25819 Other specified joint disorders, unspecified shoulder: Secondary | ICD-10-CM | POA: Diagnosis not present

## 2012-10-04 DIAGNOSIS — M25819 Other specified joint disorders, unspecified shoulder: Secondary | ICD-10-CM | POA: Diagnosis not present

## 2012-10-04 DIAGNOSIS — S43429A Sprain of unspecified rotator cuff capsule, initial encounter: Secondary | ICD-10-CM | POA: Diagnosis not present

## 2012-10-06 DIAGNOSIS — M25819 Other specified joint disorders, unspecified shoulder: Secondary | ICD-10-CM | POA: Diagnosis not present

## 2012-10-06 DIAGNOSIS — S43429A Sprain of unspecified rotator cuff capsule, initial encounter: Secondary | ICD-10-CM | POA: Diagnosis not present

## 2012-10-11 DIAGNOSIS — S43429A Sprain of unspecified rotator cuff capsule, initial encounter: Secondary | ICD-10-CM | POA: Diagnosis not present

## 2012-10-11 DIAGNOSIS — M25819 Other specified joint disorders, unspecified shoulder: Secondary | ICD-10-CM | POA: Diagnosis not present

## 2012-10-13 DIAGNOSIS — S43429A Sprain of unspecified rotator cuff capsule, initial encounter: Secondary | ICD-10-CM | POA: Diagnosis not present

## 2012-10-13 DIAGNOSIS — M25819 Other specified joint disorders, unspecified shoulder: Secondary | ICD-10-CM | POA: Diagnosis not present

## 2012-10-18 DIAGNOSIS — M25819 Other specified joint disorders, unspecified shoulder: Secondary | ICD-10-CM | POA: Diagnosis not present

## 2012-10-20 DIAGNOSIS — S43429A Sprain of unspecified rotator cuff capsule, initial encounter: Secondary | ICD-10-CM | POA: Diagnosis not present

## 2012-10-25 DIAGNOSIS — S43429A Sprain of unspecified rotator cuff capsule, initial encounter: Secondary | ICD-10-CM | POA: Diagnosis not present

## 2012-10-27 DIAGNOSIS — M25819 Other specified joint disorders, unspecified shoulder: Secondary | ICD-10-CM | POA: Diagnosis not present

## 2012-10-27 DIAGNOSIS — S43429A Sprain of unspecified rotator cuff capsule, initial encounter: Secondary | ICD-10-CM | POA: Diagnosis not present

## 2012-11-01 DIAGNOSIS — M25819 Other specified joint disorders, unspecified shoulder: Secondary | ICD-10-CM | POA: Diagnosis not present

## 2012-11-01 DIAGNOSIS — S43429A Sprain of unspecified rotator cuff capsule, initial encounter: Secondary | ICD-10-CM | POA: Diagnosis not present

## 2012-11-04 DIAGNOSIS — M25819 Other specified joint disorders, unspecified shoulder: Secondary | ICD-10-CM | POA: Diagnosis not present

## 2012-11-04 DIAGNOSIS — S43429A Sprain of unspecified rotator cuff capsule, initial encounter: Secondary | ICD-10-CM | POA: Diagnosis not present

## 2012-11-09 DIAGNOSIS — S43429A Sprain of unspecified rotator cuff capsule, initial encounter: Secondary | ICD-10-CM | POA: Diagnosis not present

## 2012-11-09 DIAGNOSIS — M25819 Other specified joint disorders, unspecified shoulder: Secondary | ICD-10-CM | POA: Diagnosis not present

## 2012-11-11 DIAGNOSIS — M25819 Other specified joint disorders, unspecified shoulder: Secondary | ICD-10-CM | POA: Diagnosis not present

## 2012-11-11 DIAGNOSIS — S43429A Sprain of unspecified rotator cuff capsule, initial encounter: Secondary | ICD-10-CM | POA: Diagnosis not present

## 2012-11-15 DIAGNOSIS — M25819 Other specified joint disorders, unspecified shoulder: Secondary | ICD-10-CM | POA: Diagnosis not present

## 2012-11-19 DIAGNOSIS — S43429A Sprain of unspecified rotator cuff capsule, initial encounter: Secondary | ICD-10-CM | POA: Diagnosis not present

## 2012-11-22 DIAGNOSIS — S43429A Sprain of unspecified rotator cuff capsule, initial encounter: Secondary | ICD-10-CM | POA: Diagnosis not present

## 2012-11-22 DIAGNOSIS — M25819 Other specified joint disorders, unspecified shoulder: Secondary | ICD-10-CM | POA: Diagnosis not present

## 2012-11-29 DIAGNOSIS — S43429A Sprain of unspecified rotator cuff capsule, initial encounter: Secondary | ICD-10-CM | POA: Diagnosis not present

## 2012-11-29 DIAGNOSIS — M25819 Other specified joint disorders, unspecified shoulder: Secondary | ICD-10-CM | POA: Diagnosis not present

## 2012-12-03 DIAGNOSIS — M25819 Other specified joint disorders, unspecified shoulder: Secondary | ICD-10-CM | POA: Diagnosis not present

## 2012-12-03 DIAGNOSIS — S43429A Sprain of unspecified rotator cuff capsule, initial encounter: Secondary | ICD-10-CM | POA: Diagnosis not present

## 2012-12-06 ENCOUNTER — Ambulatory Visit (INDEPENDENT_AMBULATORY_CARE_PROVIDER_SITE_OTHER): Payer: BC Managed Care – PPO | Admitting: Internal Medicine

## 2012-12-06 ENCOUNTER — Encounter: Payer: Self-pay | Admitting: Internal Medicine

## 2012-12-06 VITALS — BP 104/60 | HR 84 | Temp 98.4°F | Ht 70.0 in | Wt 192.4 lb

## 2012-12-06 DIAGNOSIS — N401 Enlarged prostate with lower urinary tract symptoms: Secondary | ICD-10-CM | POA: Diagnosis not present

## 2012-12-06 DIAGNOSIS — I10 Essential (primary) hypertension: Secondary | ICD-10-CM

## 2012-12-06 DIAGNOSIS — N138 Other obstructive and reflux uropathy: Secondary | ICD-10-CM

## 2012-12-06 MED ORDER — DOXAZOSIN MESYLATE 4 MG PO TABS: 4.0000 mg | ORAL_TABLET | Freq: Every day | ORAL | Status: DC

## 2012-12-06 NOTE — Progress Notes (Signed)
Subjective:    Patient ID: Isaac [name], male    DOB: July 05, 1940   MRN: [id number]    Brief patient profile:  71yowm with h/o IHD/HBP/hyperlipidemia followed here for Primary care.   06/30/2011 f/u ov/Isaac [name] cc mostly dry cough x one month then new onset LLQ abd pain daily not present sleeping but daily waxes and wanes sev hours at a time no obvious trigger with activity or meals/flatus/ bms rec Prednisone 10 mg take  4 each am x 2 days,   2 each am x 2 days,  1 each am x2days and stop For cough use delsym cough syrup and stop fish oil until no longer coughing at all Try prilosec 20mg   Take 30-60 min before first meal of the day and Pepcid 20 mg one bedtime .  GERD diet  07/14/2011 f/u ov/Isaac [name] cough gone but  cc persistent x one month LLQ pain  Rad across ant abd to naval area plus difficulty with urination x 5 days  But no flank pain, hematuria, dysuria or use of otcs/antihistamine.  Abd pain waxes and wanes worse for maybe an hour then better but always present a little and not worse coughing. No pattern of pain in terms of alleviating or exacerbating.  >>CT abd pelvis neg > referred to [facility]/ [name]> corrected with L Inguinal sugery 12/2011     05/21/2012 f/u ov/Isaac [name] for f/u hbp cc fell off ladder x one month, better on nsaids still working remodeling, no cp or sob tia or claudication symptoms rec No change rx   August 05 2012 R shoulder surgery outpt  09/06/2012 f/u ov/Isaac [name] comprehensive yearly eval for multiple med problems Chief Complaint  Patient presents with  . Annual Exam    Had fall x approx 5 months ago- pt c/o right shoulder pain.    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 Follow-up    Pt states  doing well and denies any co's today.   Urinates q 2 h with decreased fos but bp low on norvasc and cardura 1 mg qhs.  No dysuria, back pain, fever  Sleeping ok without nocturnal  or early am exacerbation  of respiratory  c/o's  . Also denies any obvious fluctuation of symptoms with weather or environmental changes or other aggravating or alleviating factors except as outlined above   Current Medications, Allergies,   Family History, and Social History were reviewed in [employer] record.  ROS  The following are not active complaints unless bolded sore throat, dysphagia, dental problems, itching, sneezing,  nasal congestion or excess/ purulent secretions, ear ache,   fever, chills, sweats, unintended wt loss, pleuritic or exertional cp, hemoptysis,  orthopnea pnd or leg swelling, presyncope, palpitations, heartburn, abdominal pain, anorexia, nausea, vomiting, diarrhea  or change in bowel or urinary habits, change in stools or urine, dysuria,hematuria,  rash, arthralgias, visual complaints, headache, numbness weakness or ataxia or problems with walking or coordination,  change in mood/affect or memory.         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 Ext canals full of wax bilaterally  Neck without JVD/Nodes/TM  Lungs clear to A and P bilaterally without cough on insp or exp maneuvers  RRR no s3 or murmur or increase in P2   LLE  pulses slt reduced vs R  Abd soft , no tenderness, BS+ No bruits or organomegaly  EXT no edema, from left ankle, nl pulses  MS nl gait,  no restrictions or deformities,  Neuro intact sensorium, no motor deficits GU testes down bilat, no IH, L Inguinal scar Mod bph, slt assym on R, no nodules, stool G neg Skin   Few seb Keratoses    CXR  09/06/2012 :     No acute cardiopulmonary abnormality.   Assessment & Plan:

## 2012-12-06 NOTE — Patient Instructions (Addendum)
Stop norvasc( amlodipine)  Increase cardura to 4 mg at bedtime  Ok to take one half if light headed standing  Please schedule a follow up visit in 3 months but call sooner if needed

## 2012-12-07 DIAGNOSIS — S43429A Sprain of unspecified rotator cuff capsule, initial encounter: Secondary | ICD-10-CM | POA: Diagnosis not present

## 2012-12-07 DIAGNOSIS — M25819 Other specified joint disorders, unspecified shoulder: Secondary | ICD-10-CM | POA: Diagnosis not present

## 2012-12-07 NOTE — Assessment & Plan Note (Signed)
Lab Results  Component Value Date   CREATININE 1.3 09/06/2012   CREATININE 1.10 01/16/2012   CREATININE 1.38* 10/22/2011   CREATININE 1.33 10/13/2011    freq urination with decreased fos > titrate up cardura to max of 8 mg qhs if bp allows and if not better > urology eval.

## 2012-12-07 NOTE — Assessment & Plan Note (Signed)
Need trial off norvasc to see if can titrate up cardura for urinary flow/ frequency

## 2012-12-08 DIAGNOSIS — M25819 Other specified joint disorders, unspecified shoulder: Secondary | ICD-10-CM | POA: Diagnosis not present

## 2012-12-10 DIAGNOSIS — S43429A Sprain of unspecified rotator cuff capsule, initial encounter: Secondary | ICD-10-CM | POA: Diagnosis not present

## 2012-12-10 DIAGNOSIS — M25819 Other specified joint disorders, unspecified shoulder: Secondary | ICD-10-CM | POA: Diagnosis not present

## 2012-12-14 DIAGNOSIS — S43429A Sprain of unspecified rotator cuff capsule, initial encounter: Secondary | ICD-10-CM | POA: Diagnosis not present

## 2012-12-14 DIAGNOSIS — M25819 Other specified joint disorders, unspecified shoulder: Secondary | ICD-10-CM | POA: Diagnosis not present

## 2012-12-16 DIAGNOSIS — S43429A Sprain of unspecified rotator cuff capsule, initial encounter: Secondary | ICD-10-CM | POA: Diagnosis not present

## 2012-12-21 DIAGNOSIS — S43429A Sprain of unspecified rotator cuff capsule, initial encounter: Secondary | ICD-10-CM | POA: Diagnosis not present

## 2012-12-21 DIAGNOSIS — M25819 Other specified joint disorders, unspecified shoulder: Secondary | ICD-10-CM | POA: Diagnosis not present

## 2012-12-23 DIAGNOSIS — S43429A Sprain of unspecified rotator cuff capsule, initial encounter: Secondary | ICD-10-CM | POA: Diagnosis not present

## 2012-12-23 DIAGNOSIS — M25819 Other specified joint disorders, unspecified shoulder: Secondary | ICD-10-CM | POA: Diagnosis not present

## 2012-12-28 DIAGNOSIS — S43429A Sprain of unspecified rotator cuff capsule, initial encounter: Secondary | ICD-10-CM | POA: Diagnosis not present

## 2012-12-28 DIAGNOSIS — M25819 Other specified joint disorders, unspecified shoulder: Secondary | ICD-10-CM | POA: Diagnosis not present

## 2012-12-30 DIAGNOSIS — M75120 Complete rotator cuff tear or rupture of unspecified shoulder, not specified as traumatic: Secondary | ICD-10-CM | POA: Diagnosis not present

## 2012-12-30 DIAGNOSIS — S43429A Sprain of unspecified rotator cuff capsule, initial encounter: Secondary | ICD-10-CM | POA: Diagnosis not present

## 2012-12-30 DIAGNOSIS — M25819 Other specified joint disorders, unspecified shoulder: Secondary | ICD-10-CM | POA: Diagnosis not present

## 2013-01-04 DIAGNOSIS — M75120 Complete rotator cuff tear or rupture of unspecified shoulder, not specified as traumatic: Secondary | ICD-10-CM | POA: Diagnosis not present

## 2013-01-11 DIAGNOSIS — S43429A Sprain of unspecified rotator cuff capsule, initial encounter: Secondary | ICD-10-CM | POA: Diagnosis not present

## 2013-01-11 DIAGNOSIS — M75120 Complete rotator cuff tear or rupture of unspecified shoulder, not specified as traumatic: Secondary | ICD-10-CM | POA: Diagnosis not present

## 2013-01-11 DIAGNOSIS — M25819 Other specified joint disorders, unspecified shoulder: Secondary | ICD-10-CM | POA: Diagnosis not present

## 2013-01-13 DIAGNOSIS — M25819 Other specified joint disorders, unspecified shoulder: Secondary | ICD-10-CM | POA: Diagnosis not present

## 2013-01-13 DIAGNOSIS — M75120 Complete rotator cuff tear or rupture of unspecified shoulder, not specified as traumatic: Secondary | ICD-10-CM | POA: Diagnosis not present

## 2013-01-13 DIAGNOSIS — S43429A Sprain of unspecified rotator cuff capsule, initial encounter: Secondary | ICD-10-CM | POA: Diagnosis not present

## 2013-01-17 ENCOUNTER — Other Ambulatory Visit: Payer: Self-pay | Admitting: Internal Medicine

## 2013-01-17 DIAGNOSIS — S43429A Sprain of unspecified rotator cuff capsule, initial encounter: Secondary | ICD-10-CM | POA: Diagnosis not present

## 2013-01-17 DIAGNOSIS — M25819 Other specified joint disorders, unspecified shoulder: Secondary | ICD-10-CM | POA: Diagnosis not present

## 2013-01-17 DIAGNOSIS — M75120 Complete rotator cuff tear or rupture of unspecified shoulder, not specified as traumatic: Secondary | ICD-10-CM | POA: Diagnosis not present

## 2013-01-19 DIAGNOSIS — S43429A Sprain of unspecified rotator cuff capsule, initial encounter: Secondary | ICD-10-CM | POA: Diagnosis not present

## 2013-01-19 DIAGNOSIS — M75120 Complete rotator cuff tear or rupture of unspecified shoulder, not specified as traumatic: Secondary | ICD-10-CM | POA: Diagnosis not present

## 2013-01-24 DIAGNOSIS — S43429A Sprain of unspecified rotator cuff capsule, initial encounter: Secondary | ICD-10-CM | POA: Diagnosis not present

## 2013-01-24 DIAGNOSIS — M75120 Complete rotator cuff tear or rupture of unspecified shoulder, not specified as traumatic: Secondary | ICD-10-CM | POA: Diagnosis not present

## 2013-01-31 DIAGNOSIS — M75120 Complete rotator cuff tear or rupture of unspecified shoulder, not specified as traumatic: Secondary | ICD-10-CM | POA: Diagnosis not present

## 2013-02-18 ENCOUNTER — Other Ambulatory Visit: Payer: Self-pay | Admitting: Internal Medicine

## 2013-02-21 ENCOUNTER — Other Ambulatory Visit: Payer: Self-pay | Admitting: Internal Medicine

## 2013-03-07 ENCOUNTER — Encounter: Payer: Self-pay | Admitting: Internal Medicine

## 2013-03-07 ENCOUNTER — Ambulatory Visit (INDEPENDENT_AMBULATORY_CARE_PROVIDER_SITE_OTHER): Payer: Medicare Other | Admitting: Internal Medicine

## 2013-03-07 VITALS — BP 140/80 | HR 66 | Temp 97.8°F | Ht 70.5 in | Wt 189.2 lb

## 2013-03-07 DIAGNOSIS — N401 Enlarged prostate with lower urinary tract symptoms: Secondary | ICD-10-CM

## 2013-03-07 DIAGNOSIS — I10 Essential (primary) hypertension: Secondary | ICD-10-CM

## 2013-03-07 DIAGNOSIS — Z23 Encounter for immunization: Secondary | ICD-10-CM

## 2013-03-07 DIAGNOSIS — N138 Other obstructive and reflux uropathy: Secondary | ICD-10-CM

## 2013-03-07 MED ORDER — DOXAZOSIN MESYLATE 4 MG PO TABS: ORAL_TABLET | ORAL | Status: DC

## 2013-03-07 NOTE — Progress Notes (Signed)
Subjective:    Patient ID: Isaac [name], male    DOB: 1941-01-03   MRN: [id number]    Brief patient profile:  55 yowm with h/o IHD/HBP/hyperlipidemia followed here for Primary care.   06/30/2011 f/u ov/Isaac [name] cc mostly dry cough x one month then new onset LLQ abd pain daily not present sleeping but daily waxes and wanes sev hours at a time no obvious trigger with activity or meals/flatus/ bms rec Prednisone 10 mg take  4 each am x 2 days,   2 each am x 2 days,  1 each am x2days and stop For cough use delsym cough syrup and stop fish oil until no longer coughing at all Try prilosec 20mg   Take 30-60 min before first meal of the day and Pepcid 20 mg one bedtime .  GERD diet  07/14/2011 f/u ov/Isaac [name] cough gone but  cc persistent x one month LLQ pain  Rad across ant abd to naval area plus difficulty with urination x 5 days  But no flank pain, hematuria, dysuria or use of otcs/antihistamine.  Abd pain waxes and wanes worse for maybe an hour then better but always present a little and not worse coughing. No pattern of pain in terms of alleviating or exacerbating.  >>CT abd pelvis neg > referred to [facility]/ [name]> corrected with L Inguinal sugery 12/2011     05/21/2012 f/u ov/Isaac [name] for f/u hbp cc fell off ladder x one month, better on nsaids still working remodeling, no cp or sob tia or claudication symptoms rec No change rx   August 05 2012 R shoulder surgery outpt  09/06/2012 f/u ov/Isaac [name] comprehensive yearly eval for multiple med problems Chief Complaint  Patient presents with  . Annual Exam    Had fall x approx 5 months ago- pt c/o right shoulder pain.    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 Follow-up    Pt  states doing well and denies any co's today.  Urinates q 2 h with decreased fos but bp low on norvasc and cardura 1 mg qhs.  No dysuria, back pain, fever Stop norvasc( amlodipine)  Increase cardura to 4 mg at bedtime  Ok to take one half if light headed standing   03/07/2013 f/u ov/Isaac [name] re: hpb /urinary hesitancy Chief Complaint  Patient presents with  . Follow-up    Breathing is unchanged since last OV.  Flu shot today   Since increased cardura > Having more urinary urgency / ? Excess fluid but stream is def better  No sob, tia/ claudication/ dysuria  Sleeping ok without nocturnal  or early am exacerbation  of respiratory  c/o's  . Also denies any obvious fluctuation of symptoms with weather or environmental changes or other aggravating or alleviating factors except as outlined above   Current Medications, Allergies,   Family History, and Social History were reviewed in [employer] record.  ROS  The following are not active complaints unless bolded sore throat, dysphagia, dental problems, itching, sneezing,  nasal congestion or excess/ purulent secretions, ear ache,   fever, chills, sweats, unintended wt loss, pleuritic or exertional cp, hemoptysis,  orthopnea pnd or leg swelling, presyncope, palpitations,  heartburn, abdominal pain, anorexia, nausea, vomiting, diarrhea  or change in bowel or urinary habits, change in  urine,  Change in bowel habits, dysuria,hematuria,  rash, arthralgias, visual complaints, headache, numbness weakness or ataxia or problems with walking or coordination,  change in mood/affect or memory.         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 Isaac [name] (presented as sudden death) ARTHRALGIA (ICD-719.40)  HYPERTENSION (ICD-401.9)  HYPERLIPIDEMIA (ICD-272.4)  ALLERGIC RHINITIS (ICD-477.9)  L IH repair 1984 GERD/  Barrett's............................................................................[name] Isaac [name] LLQ abd pain onset 05/2011...........................................................[name] Isaac [name] -  01/23/12 scar tissue removed > pain resolved  HEALTH MAINTENANCE.................................................................Marland KitchenWert  -Td 05/2004 -Pneumovax 03/2005  -CPX 09/06/2012  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 Ext canals full of wax bilaterally  Neck without JVD/Nodes/TM  Lungs clear to A and P bilaterally without cough on insp or exp maneuvers  RRR no s3 or murmur or increase in P2   LLE  pulses slt reduced vs R  Abd soft , no tenderness, BS+ No bruits or organomegaly  EXT no edema, from left ankle, nl pulses       CXR  09/06/2012 :     No acute cardiopulmonary abnormality.   Assessment & Plan:

## 2013-03-07 NOTE — Patient Instructions (Addendum)
Increased the cardura (doxazosin) to 4 mg twice daily and call if urinary frequency does not improve  Reduce fluid intake and only drink when thirsty  Please schedule a follow up visit in 3 months but call sooner if needed

## 2013-03-08 NOTE — Assessment & Plan Note (Signed)
Still having frequency ? Overflow or due to cardura effect on sphincter - try double dose and if not better > urology eval

## 2013-03-08 NOTE — Assessment & Plan Note (Signed)
Not optimal control off norvase, will increase cardura to see if helps this and urination issues.

## 2013-03-21 ENCOUNTER — Telehealth: Payer: Self-pay | Admitting: Internal Medicine

## 2013-03-21 MED ORDER — DOXAZOSIN MESYLATE 4 MG PO TABS: ORAL_TABLET | ORAL | Status: DC

## 2013-03-21 NOTE — Telephone Encounter (Signed)
Pt aware rx has been sent. Nothing further needed 

## 2013-05-02 ENCOUNTER — Other Ambulatory Visit: Payer: Self-pay | Admitting: Internal Medicine

## 2013-07-07 ENCOUNTER — Other Ambulatory Visit: Payer: Self-pay | Admitting: Internal Medicine

## 2013-11-08 ENCOUNTER — Encounter: Payer: Self-pay | Admitting: Internal Medicine

## 2013-11-08 ENCOUNTER — Other Ambulatory Visit (INDEPENDENT_AMBULATORY_CARE_PROVIDER_SITE_OTHER): Payer: Medicare Other

## 2013-11-08 ENCOUNTER — Ambulatory Visit (INDEPENDENT_AMBULATORY_CARE_PROVIDER_SITE_OTHER): Payer: Medicare Other | Admitting: Internal Medicine

## 2013-11-08 ENCOUNTER — Ambulatory Visit (INDEPENDENT_AMBULATORY_CARE_PROVIDER_SITE_OTHER)
Admission: RE | Admit: 2013-11-08 | Discharge: 2013-11-08 | Disposition: A | Discharge status: Home / Self Care | Payer: Medicare Other | Source: Ambulatory Visit | Attending: Internal Medicine | Admitting: Internal Medicine

## 2013-11-08 VITALS — BP 106/60 | HR 68 | Temp 97.8°F | Ht 70.0 in | Wt 190.8 lb

## 2013-11-08 DIAGNOSIS — R0989 Other specified symptoms and signs involving the circulatory and respiratory systems: Secondary | ICD-10-CM

## 2013-11-08 DIAGNOSIS — R0600 Dyspnea, unspecified: Secondary | ICD-10-CM

## 2013-11-08 DIAGNOSIS — R0609 Other forms of dyspnea: Secondary | ICD-10-CM

## 2013-11-08 HISTORY — DX: Dyspnea, unspecified: R06.00

## 2013-11-08 HISTORY — DX: Other forms of dyspnea: R06.09

## 2013-11-08 LAB — CBC WITH DIFFERENTIAL/PLATELET
Basophils Absolute: 0.1 10*3/uL (ref 0.0–0.1)
Basophils Relative: 0.8 % (ref 0.0–3.0)
EOS ABS: 0.3 10*3/uL (ref 0.0–0.7)
EOS PCT: 4.1 % (ref 0.0–5.0)
HCT: 41.3 % (ref 39.0–52.0)
Hemoglobin: 14 g/dL (ref 13.0–17.0)
LYMPHS PCT: 27.3 % (ref 12.0–46.0)
Lymphs Abs: 2 10*3/uL (ref 0.7–4.0)
MCHC: 33.9 g/dL (ref 30.0–36.0)
MCV: 96.1 fl (ref 78.0–100.0)
MONOS PCT: 9.7 % (ref 3.0–12.0)
Monocytes Absolute: 0.7 10*3/uL (ref 0.1–1.0)
NEUTROS ABS: 4.2 10*3/uL (ref 1.4–7.7)
NEUTROS PCT: 58.1 % (ref 43.0–77.0)
Platelets: 146 10*3/uL — ABNORMAL LOW (ref 150.0–400.0)
RBC: 4.3 Mil/uL (ref 4.22–5.81)
RDW: 13.4 % (ref 11.5–15.5)
WBC: 7.3 10*3/uL (ref 4.0–10.5)

## 2013-11-08 LAB — BASIC METABOLIC PANEL
BUN: 20 mg/dL (ref 6–23)
CALCIUM: 9.3 mg/dL (ref 8.4–10.5)
CO2: 28 meq/L (ref 19–32)
CREATININE: 1.4 mg/dL (ref 0.4–1.5)
Chloride: 101 mEq/L (ref 96–112)
GFR: 51.96 mL/min — ABNORMAL LOW (ref >60.00)
Glucose, Bld: 111 mg/dL — ABNORMAL HIGH (ref 70–99)
Potassium: 3.4 mEq/L — ABNORMAL LOW (ref 3.5–5.1)
SODIUM: 138 meq/L (ref 135–145)

## 2013-11-08 LAB — TSH: TSH: 2.32 u[IU]/mL (ref 0.35–4.50)

## 2013-11-08 LAB — BRAIN NATRIURETIC PEPTIDE: Pro B Natriuretic peptide (BNP): 212 pg/mL — ABNORMAL HIGH (ref 0.0–100.0)

## 2013-11-08 NOTE — Progress Notes (Signed)
Subjective:    Patient ID: Isaac [name], male    DOB: 1941-04-25   MRN: [id number]    Brief patient profile:  72 yowm with h/o IHD/HBP/hyperlipidemia followed here for Primary care.   06/30/2011 f/u ov/Isaac [name] cc mostly dry cough x one month then new onset LLQ abd pain daily not present sleeping but daily waxes and wanes sev hours at a time no obvious trigger with activity or meals/flatus/ bms rec Prednisone 10 mg take  4 each am x 2 days,   2 each am x 2 days,  1 each am x2days and stop For cough use delsym cough syrup and stop fish oil until no longer coughing at all Try prilosec 20mg   Take 30-60 min before first meal of the day and Pepcid 20 mg one bedtime .  GERD diet  07/14/2011 f/u ov/Isaac [name] cough gone but  cc persistent x one month LLQ pain  Rad across ant abd to naval area plus difficulty with urination x 5 days  But no flank pain, hematuria, dysuria or use of otcs/antihistamine.  Abd pain waxes and wanes worse for maybe an hour then better but always present a little and not worse coughing. No pattern of pain in terms of alleviating or exacerbating.  >>CT abd pelvis neg > referred to [facility]/ Isaac [name]> corrected with L Inguinal sugery 12/2011     05/21/2012 f/u ov/Isaac [name] for f/u hbp cc fell off ladder x one month, better on nsaids still working remodeling, no cp or sob tia or claudication symptoms rec No change rx   August 05 2012 R shoulder surgery outpt  09/06/2012 f/u ov/Isaac [name] comprehensive yearly eval for multiple med problems Chief Complaint  Patient presents with  . Annual Exam    Had fall x approx 5 months ago- pt c/o right shoulder pain.    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 Follow-up    Pt  states doing well and denies any co's today.  Urinates q 2 h with decreased fos but bp low on norvasc and cardura 1 mg qhs.  No dysuria, back pain, fever Stop norvasc( amlodipine)  Increase cardura to 4 mg at bedtime  Ok to take one half if light headed standing   03/07/2013 f/u ov/Isaac [name] re: hpb /urinary hesitancy Chief Complaint  Patient presents with  . Follow-up    Breathing is unchanged since last OV.  Flu shot today   Since increased cardura > Having more urinary urgency / ? Excess fluid but stream is def better rec Increased the cardura (doxazosin) to 4 mg twice daily and call if urinary frequency does not improve  Reduce fluid intake and only drink when thirsty   11/08/2013 acut ov/Isaac [name] re: new sob Chief Complaint  Patient presents with  . Follow-up    Pt c/o increased DOE for the past month- gets SOB walking to the mailbox and back.   indolent onset x 6 wks doe,  Sometimes orthopnea, rare though.  Progressed to where  Consistently 50 ft sob Only new problem is cold feet   No obvious day to day or daytime variabilty or assoc chronic cough or cp or chest tightness, subjective wheeze overt  sinus or hb symptoms. No unusual exp hx or h/o childhood pna/ asthma or knowledge of premature birth.  Sleeping ok without nocturnal  or early am exacerbation  of respiratory  c/o's or need for noct saba. Also denies any obvious fluctuation of symptoms with weather or environmental changes or other aggravating or alleviating factors except as outlined above   Current Medications, Allergies, Complete Past Medical History, Past Surgical History, Family History, and Social History were reviewed in [employer] record.  ROS  The following are not active complaints unless bolded sore throat, dysphagia, dental problems, itching, sneezing,  nasal congestion or excess/ purulent secretions, ear ache,   fever, chills, sweats, unintended wt loss, pleuritic or exertional cp,  hemoptysis,  orthopnea pnd or leg swelling, presyncope, palpitations, heartburn, abdominal pain, anorexia, nausea, vomiting, diarrhea  or change in bowel or urinary habits, change in stools or urine, dysuria,hematuria,  rash, arthralgias, visual complaints, headache, numbness weakness or ataxia or problems with walking or coordination,  change in mood/affect or memory.              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 Ext canals full of wax bilaterally  Neck without JVD/Nodes/TM  Lungs clear to A and P bilaterally without cough on insp or exp maneuvers  RRR no s3 or murmur or increase in P2   LLE  pulses slt reduced vs R  Abd soft , no tenderness, BS+ No bruits or organomegaly  EXT no edema,  , nl pulses      CXR  11/08/2013 :  The heart size and mediastinal contours are within normal limits. Status post coronary artery bypass graft. No pneumothorax or pleural effusion is noted. Both  lungs are clear. The visualized skeletal structures are unremarkable   Lab Results  Component Value Date   PROBNP 212.0* 11/08/2013    Recent Labs Lab 11/08/13 1617  NA 138  K 3.4*  CL 101  CO2 28  BUN 20  CREATININE 1.4  GLUCOSE 111*    Recent Labs Lab 11/08/13 1617  HGB 14.0  HCT 41.3  WBC 7.3  PLT 146.0*     Lab Results  Component Value Date   TSH 2.32 11/08/2013      Assessment & Plan:

## 2013-11-08 NOTE — Assessment & Plan Note (Addendum)
-   11/08/2013  Walked RA x 3 laps @ 185 ft each stopped due to  End of study, mild sob, no cp and no desat, EKG ok  Symptoms are markedly disproportionate to objective findings and not clear this is a lung problem at all so strongly suspect this represents an anginal equivalent.     Adherence is always the initial "prime suspect" and is a multilayered concern that requires a "trust but verify" approach in every patient - starting with knowing how to use medications, especially inhalers, correctly, keeping up with refills and understanding the fundamental difference between maintenance and prns vs those medications only taken for a very short course and then stopped and not refilled.   ? Acid (or non-acid) GERD > always difficult to exclude as up to 75% of pts in some series report no assoc GI/ Heartburn symptoms> rec continue max (24h)  acid suppression and diet restrictions/ reviewed     ? CHF/ diastolic vs anginal / BNP is immediate but certainly not normal. Will refer to Dr Lovena Le and ask pt to avoid stress / heat for now since not having resting symptoms   ? PE >   D dimer min elevated, prob ok age adjusted > although this may miss small peripheral pe, the clot burden with sob is moderately high and the d dimer being this low has a very high neg pred value for PE in this setting

## 2013-11-08 NOTE — Patient Instructions (Addendum)
Please remember to go to the lab and x-ray department downstairs for your tests - we will call you with the results when they are available   Late add:  rec avoid stress/ heat > expedite referral to Dr Lovena Le

## 2013-11-09 ENCOUNTER — Telehealth: Payer: Self-pay | Admitting: *Deleted

## 2013-11-09 LAB — D-DIMER, QUANTITATIVE (NOT AT ARMC): D DIMER QUANT: 0.67 ug{FEU}/mL — AB (ref 0.00–0.48)

## 2013-11-09 NOTE — Telephone Encounter (Signed)
Spoke with the pt and notified of recs per MW He verbalized understanding  He has appt with Dr Lovena Le for 12/13/13 I will call in the am and see about getting him a sooner appt

## 2013-11-09 NOTE — Progress Notes (Signed)
Quick Note:  Spoke with pt and notified of results per Dr. [name]. Pt verbalized understanding and denied any questions.  ______ 

## 2013-11-09 NOTE — Telephone Encounter (Signed)
Message copied by Rosana Berger on Wed Nov 09, 2013  5:29 PM ------      Message from: Christinia Gully B      Created: Tue Nov 08, 2013  8:43 PM       This is probably an anginal equivalent         rec avoid stress/ heat > expedite referral to Dr Lovena Le asap ------

## 2013-11-10 ENCOUNTER — Telehealth: Payer: Self-pay | Admitting: Internal Medicine

## 2013-11-10 NOTE — Telephone Encounter (Signed)
11/14/13 in [facility]

## 2013-11-10 NOTE — Telephone Encounter (Signed)
°  Magda Paganini with Dr Gustavus Bryant office wants patient to be seen ASAP. Please call and advise.

## 2013-11-10 NOTE — Telephone Encounter (Signed)
Return call.Stanley A Dalton °

## 2013-11-10 NOTE — Telephone Encounter (Addendum)
Called and spoke with Adonis Brook at Dr. Lovena Le office  She is going to check with the triage nurse and call back to let us know when the pt can be worked in

## 2013-11-10 NOTE — Telephone Encounter (Signed)
Spoke with Dr Tanna Furry office  They have scheduled pt to see Dr Lovena Le on 11/14/13 at the [place] pt to inform him  Had to [facility]

## 2013-11-10 NOTE — Telephone Encounter (Signed)
Pt returned call.  Pt is aware of appt w/ Dr. Lovena Le on 11/14/13 at 10:30 AM at the [facility].  Pt states nothing further needed at this time.  Satira Anis

## 2013-11-14 ENCOUNTER — Encounter: Payer: Self-pay | Admitting: Internal Medicine

## 2013-11-14 ENCOUNTER — Encounter: Payer: Self-pay | Admitting: *Deleted

## 2013-11-14 ENCOUNTER — Ambulatory Visit (INDEPENDENT_AMBULATORY_CARE_PROVIDER_SITE_OTHER): Payer: Medicare Other | Admitting: Internal Medicine

## 2013-11-14 VITALS — BP 85/65 | HR 71 | Ht 70.5 in | Wt 188.0 lb

## 2013-11-14 DIAGNOSIS — R9431 Abnormal electrocardiogram [ECG] [EKG]: Secondary | ICD-10-CM | POA: Diagnosis not present

## 2013-11-14 DIAGNOSIS — I259 Chronic ischemic heart disease, unspecified: Secondary | ICD-10-CM | POA: Diagnosis not present

## 2013-11-14 DIAGNOSIS — R0602 Shortness of breath: Secondary | ICD-10-CM | POA: Diagnosis not present

## 2013-11-14 NOTE — Patient Instructions (Addendum)
Your physician recommends that you schedule a follow-up appointment in: Post procedure, we will call you with results once reviewed by physicain.   Your physician has requested that you have a dobutamine echocardiogram. For further information please visit [email or web address]. Please follow instruction sheet as given.

## 2013-11-14 NOTE — Progress Notes (Signed)
HPI [name] returns today for followup. 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 Over the past few weeks he has developed worsening sob with exertion, which were his symptoms prior to his CABG over 12 years ago. He denies syncope. No anginal symptoms but sob may well be his anginal equivalent. He had a stress test which demonstrated preserved LV function but no ischemia over a year ago. He can only walk about 75 yards. No Known Allergies   Current Outpatient Prescriptions  Medication Sig Dispense Refill  . aspirin (ADULT ASPIRIN EC LOW STRENGTH) 81 MG EC tablet Take 81 mg by mouth every morning. Will stop 5 days pre op      . atenolol-chlorthalidone (TENORETIC) 50-25 MG per tablet TAKE 1 TABLET DAILY  90 tablet  3  . Cholecalciferol (VITAMIN D) 2000 UNITS tablet Take 2,000 Units by mouth daily.       . clotrimazole-betamethasone (LOTRISONE) cream Apply 1 application topically 2 (two) times daily as needed. Apply to rash      . doxazosin (CARDURA) 4 MG tablet One in am and one at bedtime  180 tablet  3  . Multiple Vitamin (MULTIVITAMIN WITH MINERALS) TABS Take 1 tablet by mouth daily. Will stop  5 days pre op      . NASONEX 50 MCG/ACT nasal spray USE TWO SPRAY EACH NOSTRIL TWICE DAILY  17 g  5  . omeprazole (PRILOSEC) 20 MG capsule TAKE 1 CAPSULE TWICE DAILY  180 capsule  3  . oxymetazoline (AFRIN) 0.05 % nasal spray Place 2 sprays into the nose as needed. Dryness      . simvastatin (ZOCOR) 20 MG tablet TAKE 1 TABLET AT BEDTIME  90 tablet  3   No current facility-administered medications for this visit.     Past Medical History  Diagnosis Date  . Ischemic heart disease   . Arthralgia   . Hypertension   . Hyperlipidemia   . Allergic rhinitis   . Health maintenance examination     Td 2/06; Pneumovax 12/06; CPX 07/04/10; [name]  . Arthritis   . GERD (gastroesophageal reflux disease)   . Heart attack   .  Left groin pain 08/07/2011  . H/O hiatal hernia   . History of skin cancer     bilateral arms with removal    ROS:   All systems reviewed and negative except as noted in the HPI.   Past Surgical History  Procedure Laterality Date  . Coronary artery bypass graft  2002  . Inguinal hernia repair  1983    left  . Nose surgery  1985  . Colonoscopy    . Eye surgery      bilateral cataract extraction with  IOL  . Groin dissection  01/23/2012    Procedure: GROIN EXPLORATION;  Surgeon: Harl Bowie, MD;  Location: [facility];  Service: General;  Laterality: Left;  Left Inguinal Exploration, release of scar tissue, Placement of Mesh     Family History  Problem Relation Age of Onset  . Breast cancer Mother   . Diabetes Mother   . Hypertension Father   . Dementia Father   . Diabetes Sister   . COPD Brother   . Colon cancer Neg Hx      History   Social History  . Marital Status: Married    Spouse Name: N/A    Number of Children: 0  . Years of Education: N/A  Occupational History  . retired    Social History Main Topics  . Smoking status: Never Smoker   . Smokeless tobacco: Current User    Types: Chew  . Alcohol Use: No  . Drug Use: No  . Sexual Activity: Not on file   Other Topics Concern  . Not on file   Social History Narrative   Remarried after first wife died of emphysema in 24-Aug-2001     BP 85/65  Pulse 71  Ht 5' 10.5" (1.791 m)  Wt 188 lb (85.276 kg)  BMI 26.58 kg/m2  Physical Exam:  stable appearing 73 year old man, NAD HEENT: Unremarkable Neck:  7 cm JVD, no thyromegally Lungs:  Clear with no wheezes rales or rhonchi. HEART:  Regular rate rhythm, no murmurs, no rubs, no clicks Abd:  soft, positive bowel sounds, no organomegally, no rebound, no guarding Ext:  2 plus pulses, no edema, no cyanosis, no clubbing Skin:  No rashes no nodules Neuro:  CN II through XII intact, motor grossly intact   Assess/Plan:

## 2013-11-14 NOTE — Assessment & Plan Note (Signed)
I am concerned about sob being his anginal equivalent and he has worsened. We discussed treatment options. He cannot walk enough for regular stress testing and I have recommended her undergo dobutamine echo. If abnormal, would recommend heart cath. He is on a good medical regimen.

## 2013-11-17 ENCOUNTER — Other Ambulatory Visit: Payer: Self-pay

## 2013-11-17 ENCOUNTER — Ambulatory Visit (HOSPITAL_COMMUNITY)
Admission: RE | Admit: 2013-11-17 | Discharge: 2013-11-17 | Disposition: A | Discharge status: Home / Self Care | Payer: Medicare Other | Source: Ambulatory Visit | Attending: Cardiology | Admitting: Cardiology

## 2013-11-17 ENCOUNTER — Ambulatory Visit (HOSPITAL_COMMUNITY)
Admission: RE | Admit: 2013-11-17 | Discharge: 2013-11-17 | Disposition: A | Discharge status: Home / Self Care | Payer: Medicare Other | Source: Ambulatory Visit | Attending: Internal Medicine | Admitting: Internal Medicine

## 2013-11-17 DIAGNOSIS — R0609 Other forms of dyspnea: Secondary | ICD-10-CM | POA: Diagnosis not present

## 2013-11-17 DIAGNOSIS — I059 Rheumatic mitral valve disease, unspecified: Secondary | ICD-10-CM

## 2013-11-17 DIAGNOSIS — R0989 Other specified symptoms and signs involving the circulatory and respiratory systems: Secondary | ICD-10-CM | POA: Insufficient documentation

## 2013-11-17 DIAGNOSIS — R0602 Shortness of breath: Secondary | ICD-10-CM | POA: Insufficient documentation

## 2013-11-17 DIAGNOSIS — I2510 Atherosclerotic heart disease of native coronary artery without angina pectoris: Secondary | ICD-10-CM | POA: Insufficient documentation

## 2013-11-17 DIAGNOSIS — R9431 Abnormal electrocardiogram [ECG] [EKG]: Secondary | ICD-10-CM

## 2013-11-17 NOTE — Progress Notes (Signed)
Order placed per request

## 2013-11-18 ENCOUNTER — Encounter (HOSPITAL_COMMUNITY): Payer: Self-pay

## 2013-11-18 ENCOUNTER — Encounter (HOSPITAL_COMMUNITY)
Admission: RE | Admit: 2013-11-18 | Discharge: 2013-11-18 | Disposition: A | Discharge status: Home / Self Care | Payer: Medicare Other | Source: Ambulatory Visit | Attending: Cardiology | Admitting: Cardiology

## 2013-11-18 ENCOUNTER — Other Ambulatory Visit (HOSPITAL_COMMUNITY): Payer: Medicare Other

## 2013-11-18 ENCOUNTER — Ambulatory Visit (HOSPITAL_COMMUNITY)
Admission: RE | Admit: 2013-11-18 | Discharge: 2013-11-18 | Disposition: A | Discharge status: Home / Self Care | Payer: Medicare Other | Source: Ambulatory Visit | Attending: Internal Medicine | Admitting: Internal Medicine

## 2013-11-18 DIAGNOSIS — Z951 Presence of aortocoronary bypass graft: Secondary | ICD-10-CM | POA: Diagnosis not present

## 2013-11-18 DIAGNOSIS — R9431 Abnormal electrocardiogram [ECG] [EKG]: Secondary | ICD-10-CM

## 2013-11-18 DIAGNOSIS — I2510 Atherosclerotic heart disease of native coronary artery without angina pectoris: Secondary | ICD-10-CM | POA: Diagnosis not present

## 2013-11-18 DIAGNOSIS — R0600 Dyspnea, unspecified: Secondary | ICD-10-CM

## 2013-11-18 DIAGNOSIS — I10 Essential (primary) hypertension: Secondary | ICD-10-CM | POA: Diagnosis not present

## 2013-11-18 DIAGNOSIS — R0602 Shortness of breath: Secondary | ICD-10-CM | POA: Insufficient documentation

## 2013-11-18 DIAGNOSIS — R0609 Other forms of dyspnea: Secondary | ICD-10-CM | POA: Insufficient documentation

## 2013-11-18 DIAGNOSIS — R109 Unspecified abdominal pain: Secondary | ICD-10-CM | POA: Insufficient documentation

## 2013-11-18 DIAGNOSIS — R9439 Abnormal result of other cardiovascular function study: Secondary | ICD-10-CM | POA: Insufficient documentation

## 2013-11-18 DIAGNOSIS — E785 Hyperlipidemia, unspecified: Secondary | ICD-10-CM | POA: Insufficient documentation

## 2013-11-18 DIAGNOSIS — R0989 Other specified symptoms and signs involving the circulatory and respiratory systems: Secondary | ICD-10-CM | POA: Insufficient documentation

## 2013-11-18 MED ORDER — REGADENOSON 0.4 MG/5ML IV SOLN
INTRAVENOUS | Status: AC
  Administered 2013-11-18: 0.4 mg via INTRAVENOUS
  Filled 2013-11-18: qty 5

## 2013-11-18 MED ORDER — SODIUM CHLORIDE 0.9 % IJ SOLN
INTRAMUSCULAR | Status: AC
  Administered 2013-11-18: 10 mL via INTRAVENOUS
  Filled 2013-11-18: qty 10

## 2013-11-18 MED ORDER — TECHNETIUM TC 99M SESTAMIBI GENERIC - CARDIOLITE
10.0000 | Freq: Once | INTRAVENOUS | Status: AC | PRN
  Administered 2013-11-18: 10 via INTRAVENOUS

## 2013-11-18 MED ORDER — TECHNETIUM TC 99M SESTAMIBI - CARDIOLITE
30.0000 | Freq: Once | INTRAVENOUS | Status: AC | PRN
  Administered 2013-11-18: 11:00:00 30 via INTRAVENOUS

## 2013-11-18 NOTE — Progress Notes (Signed)
Stress Lab Nurses Notes - Isaac [name]  Isaac [name] 11/18/2013 Reason for doing test: SOB Type of test: Isaac [name] Nurse performing test: Isaac Getting, RN Nuclear Medicine Tech: Isaac [name] Echo Tech: Not Applicable MD performing test: Isaac [name] Family MD: Isaac [name] Test explained and consent signed: Yes.   IV started: 22g jelco, Saline lock flushed, No redness or edema and Saline lock started in radiology Symptoms: sOB and stomach discomfort Treatment/Intervention: None Reason test stopped: protocol completed After recovery IV was: Discontinued via X-ray tech, No redness or edema and Saline Lock flushed Patient to return to [facility]. Med at : Patient discharged: Home Patient's Condition upon discharge was: stable Comments: Lexiscan Cardiolite performed. Rest HR was 55 and rest BP was 130/78Peak Hr was 72 and peak BP was 141/83. Symptoms resolved in recovery. Isaac [name]

## 2013-11-21 ENCOUNTER — Ambulatory Visit (HOSPITAL_COMMUNITY): Payer: Medicare Other

## 2013-11-21 ENCOUNTER — Telehealth: Payer: Self-pay | Admitting: *Deleted

## 2013-11-21 ENCOUNTER — Encounter (HOSPITAL_COMMUNITY): Payer: Medicare Other

## 2013-11-21 NOTE — Telephone Encounter (Signed)
Made pt aware that they need to be resulted. Please advise

## 2013-11-21 NOTE — Telephone Encounter (Signed)
Pt is calling for test results from last Thursday, stress and echo

## 2013-11-22 ENCOUNTER — Telehealth: Payer: Self-pay | Admitting: Internal Medicine

## 2013-11-22 NOTE — Telephone Encounter (Signed)
New message ° ° ° ° °Want test results °

## 2013-11-23 ENCOUNTER — Other Ambulatory Visit: Payer: Self-pay

## 2013-11-23 ENCOUNTER — Telehealth: Payer: Self-pay | Admitting: Internal Medicine

## 2013-11-23 ENCOUNTER — Emergency Department (HOSPITAL_COMMUNITY): Payer: Medicare Other

## 2013-11-23 ENCOUNTER — Inpatient Hospital Stay (HOSPITAL_COMMUNITY)
Admission: EM | Admit: 2013-11-23 | Discharge: 2013-11-25 | DRG: 287 | Disposition: A | Discharge status: Home / Self Care | Payer: Medicare Other | Attending: Internal Medicine | Admitting: Internal Medicine

## 2013-11-23 ENCOUNTER — Encounter (HOSPITAL_COMMUNITY): Payer: Self-pay | Admitting: Emergency Medicine

## 2013-11-23 DIAGNOSIS — N183 Chronic kidney disease, stage 3 unspecified: Secondary | ICD-10-CM | POA: Diagnosis present

## 2013-11-23 DIAGNOSIS — I200 Unstable angina: Secondary | ICD-10-CM | POA: Diagnosis present

## 2013-11-23 DIAGNOSIS — R0600 Dyspnea, unspecified: Secondary | ICD-10-CM

## 2013-11-23 DIAGNOSIS — I080 Rheumatic disorders of both mitral and aortic valves: Secondary | ICD-10-CM | POA: Diagnosis present

## 2013-11-23 DIAGNOSIS — I2582 Chronic total occlusion of coronary artery: Secondary | ICD-10-CM | POA: Diagnosis present

## 2013-11-23 DIAGNOSIS — I1310 Hypertensive heart and chronic kidney disease without heart failure, with stage 1 through stage 4 chronic kidney disease, or unspecified chronic kidney disease: Secondary | ICD-10-CM | POA: Diagnosis present

## 2013-11-23 DIAGNOSIS — T82897A Other specified complication of cardiac prosthetic devices, implants and grafts, initial encounter: Secondary | ICD-10-CM | POA: Diagnosis present

## 2013-11-23 DIAGNOSIS — I10 Essential (primary) hypertension: Secondary | ICD-10-CM | POA: Diagnosis present

## 2013-11-23 DIAGNOSIS — N400 Enlarged prostate without lower urinary tract symptoms: Secondary | ICD-10-CM | POA: Diagnosis present

## 2013-11-23 DIAGNOSIS — Z7982 Long term (current) use of aspirin: Secondary | ICD-10-CM | POA: Diagnosis not present

## 2013-11-23 DIAGNOSIS — R079 Chest pain, unspecified: Secondary | ICD-10-CM | POA: Diagnosis not present

## 2013-11-23 DIAGNOSIS — K219 Gastro-esophageal reflux disease without esophagitis: Secondary | ICD-10-CM | POA: Diagnosis present

## 2013-11-23 DIAGNOSIS — Z85828 Personal history of other malignant neoplasm of skin: Secondary | ICD-10-CM

## 2013-11-23 DIAGNOSIS — I25119 Atherosclerotic heart disease of native coronary artery with unspecified angina pectoris: Secondary | ICD-10-CM

## 2013-11-23 DIAGNOSIS — I209 Angina pectoris, unspecified: Secondary | ICD-10-CM | POA: Diagnosis not present

## 2013-11-23 DIAGNOSIS — I25810 Atherosclerosis of coronary artery bypass graft(s) without angina pectoris: Secondary | ICD-10-CM

## 2013-11-23 DIAGNOSIS — R0602 Shortness of breath: Secondary | ICD-10-CM | POA: Diagnosis not present

## 2013-11-23 DIAGNOSIS — M129 Arthropathy, unspecified: Secondary | ICD-10-CM | POA: Diagnosis present

## 2013-11-23 DIAGNOSIS — R0609 Other forms of dyspnea: Secondary | ICD-10-CM | POA: Diagnosis not present

## 2013-11-23 DIAGNOSIS — K2270 Barrett's esophagus without dysplasia: Secondary | ICD-10-CM | POA: Diagnosis present

## 2013-11-23 DIAGNOSIS — Z951 Presence of aortocoronary bypass graft: Secondary | ICD-10-CM

## 2013-11-23 DIAGNOSIS — R5381 Other malaise: Secondary | ICD-10-CM | POA: Diagnosis present

## 2013-11-23 DIAGNOSIS — Y832 Surgical operation with anastomosis, bypass or graft as the cause of abnormal reaction of the patient, or of later complication, without mention of misadventure at the time of the procedure: Secondary | ICD-10-CM | POA: Diagnosis present

## 2013-11-23 DIAGNOSIS — I2510 Atherosclerotic heart disease of native coronary artery without angina pectoris: Secondary | ICD-10-CM | POA: Diagnosis present

## 2013-11-23 DIAGNOSIS — E785 Hyperlipidemia, unspecified: Secondary | ICD-10-CM | POA: Diagnosis not present

## 2013-11-23 DIAGNOSIS — I25709 Atherosclerosis of coronary artery bypass graft(s), unspecified, with unspecified angina pectoris: Secondary | ICD-10-CM

## 2013-11-23 DIAGNOSIS — R0989 Other specified symptoms and signs involving the circulatory and respiratory systems: Secondary | ICD-10-CM | POA: Diagnosis not present

## 2013-11-23 HISTORY — DX: Unstable angina: I20.0

## 2013-11-23 HISTORY — DX: Unspecified thoracic, thoracolumbar and lumbosacral intervertebral disc disorder: M51.9

## 2013-11-23 HISTORY — DX: Atherosclerosis of coronary artery bypass graft(s) without angina pectoris: I25.810

## 2013-11-23 LAB — BASIC METABOLIC PANEL
ANION GAP: 16 — AB (ref 5–15)
BUN: 20 mg/dL (ref 6–23)
CO2: 27 mEq/L (ref 19–32)
Calcium: 9.5 mg/dL (ref 8.4–10.5)
Chloride: 99 mEq/L (ref 96–112)
Creatinine, Ser: 1.47 mg/dL — ABNORMAL HIGH (ref 0.50–1.35)
GFR calc Af Amer: 53 mL/min — ABNORMAL LOW (ref >90)
GFR, EST NON AFRICAN AMERICAN: 46 mL/min — AB (ref >90)
Glucose, Bld: 83 mg/dL (ref 70–99)
Potassium: 4.2 mEq/L (ref 3.7–5.3)
SODIUM: 142 meq/L (ref 137–147)

## 2013-11-23 LAB — PROTIME-INR
INR: 1.11 (ref 0.00–1.49)
PROTHROMBIN TIME: 14.3 s (ref 11.6–15.2)

## 2013-11-23 LAB — CBC
HEMATOCRIT: 42.8 % (ref 39.0–52.0)
Hemoglobin: 14.7 g/dL (ref 13.0–17.0)
MCH: 32.7 pg (ref 26.0–34.0)
MCHC: 34.3 g/dL (ref 30.0–36.0)
MCV: 95.1 fL (ref 78.0–100.0)
PLATELETS: 138 10*3/uL — AB (ref 150–400)
RBC: 4.5 MIL/uL (ref 4.22–5.81)
RDW: 13.2 % (ref 11.5–15.5)
WBC: 6.5 10*3/uL (ref 4.0–10.5)

## 2013-11-23 LAB — I-STAT TROPONIN, ED: TROPONIN I, POC: 0.01 ng/mL (ref 0.00–0.08)

## 2013-11-23 MED ORDER — SODIUM CHLORIDE 0.9 % IV SOLN
1.0000 mL/kg/h | INTRAVENOUS | Status: DC
  Administered 2013-11-24: 1 mL/kg/h via INTRAVENOUS

## 2013-11-23 MED ORDER — SODIUM CHLORIDE 0.9 % IJ SOLN: 3.0000 mL | INTRAMUSCULAR | Status: DC | PRN

## 2013-11-23 MED ORDER — SODIUM CHLORIDE 0.9 % IJ SOLN
3.0000 mL | Freq: Two times a day (BID) | INTRAMUSCULAR | Status: DC
  Administered 2013-11-23: 3 mL via INTRAVENOUS

## 2013-11-23 MED ORDER — SODIUM CHLORIDE 0.9 % IV SOLN: 250.0000 mL | INTRAVENOUS | Status: DC | PRN

## 2013-11-23 MED ORDER — PANTOPRAZOLE SODIUM 40 MG PO TBEC
40.0000 mg | DELAYED_RELEASE_TABLET | Freq: Every day | ORAL | Status: DC
Start: 2013-11-24 — End: 2013-11-25
  Administered 2013-11-24 – 2013-11-25 (×2): 40 mg via ORAL
  Filled 2013-11-23 (×2): qty 1

## 2013-11-23 MED ORDER — HEPARIN BOLUS VIA INFUSION
4000.0000 [IU] | Freq: Once | INTRAVENOUS | Status: AC
  Administered 2013-11-23: 4000 [IU] via INTRAVENOUS
  Filled 2013-11-23: qty 4000

## 2013-11-23 MED ORDER — ASPIRIN 81 MG PO TBEC: 81.0000 mg | DELAYED_RELEASE_TABLET | ORAL | Status: DC

## 2013-11-23 MED ORDER — ASPIRIN EC 81 MG PO TBEC
81.0000 mg | DELAYED_RELEASE_TABLET | Freq: Every day | ORAL | Status: DC
  Administered 2013-11-25: 81 mg via ORAL
  Filled 2013-11-23 (×2): qty 1

## 2013-11-23 MED ORDER — ONDANSETRON HCL 4 MG/2ML IJ SOLN: 4.0000 mg | Freq: Four times a day (QID) | INTRAMUSCULAR | Status: DC | PRN

## 2013-11-23 MED ORDER — SIMVASTATIN 20 MG PO TABS
20.0000 mg | ORAL_TABLET | Freq: Every evening | ORAL | Status: DC
  Administered 2013-11-23 – 2013-11-24 (×2): 20 mg via ORAL
  Filled 2013-11-23 (×3): qty 1

## 2013-11-23 MED ORDER — ASPIRIN 81 MG PO CHEW
81.0000 mg | CHEWABLE_TABLET | ORAL | Status: AC
  Administered 2013-11-24: 81 mg via ORAL
  Filled 2013-11-23: qty 1

## 2013-11-23 MED ORDER — NITROGLYCERIN 0.4 MG SL SUBL: 0.4000 mg | SUBLINGUAL_TABLET | SUBLINGUAL | Status: DC | PRN

## 2013-11-23 MED ORDER — DOXAZOSIN MESYLATE 4 MG PO TABS
4.0000 mg | ORAL_TABLET | Freq: Every day | ORAL | Status: DC
  Administered 2013-11-23 – 2013-11-24 (×2): 4 mg via ORAL
  Filled 2013-11-23 (×3): qty 1

## 2013-11-23 MED ORDER — HEPARIN (PORCINE) IN NACL 100-0.45 UNIT/ML-% IJ SOLN
1200.0000 [IU]/h | INTRAMUSCULAR | Status: DC
  Administered 2013-11-23: 1000 [IU]/h via INTRAVENOUS
  Administered 2013-11-24: 1200 [IU]/h via INTRAVENOUS
  Filled 2013-11-23 (×2): qty 250

## 2013-11-23 MED ORDER — ACETAMINOPHEN 325 MG PO TABS: 650.0000 mg | ORAL_TABLET | ORAL | Status: DC | PRN

## 2013-11-23 MED ORDER — ATENOLOL-CHLORTHALIDONE 50-25 MG PO TABS: 1.0000 | ORAL_TABLET | Freq: Every day | ORAL | Status: DC

## 2013-11-23 MED ORDER — ATENOLOL 50 MG PO TABS
50.0000 mg | ORAL_TABLET | Freq: Every day | ORAL | Status: DC
Start: 2013-11-24 — End: 2013-11-25
  Administered 2013-11-24 – 2013-11-25 (×2): 50 mg via ORAL
  Filled 2013-11-23 (×2): qty 1

## 2013-11-23 MED ORDER — CHLORTHALIDONE 25 MG PO TABS
25.0000 mg | ORAL_TABLET | Freq: Every day | ORAL | Status: DC
  Administered 2013-11-24 – 2013-11-25 (×2): 25 mg via ORAL
  Filled 2013-11-23 (×2): qty 1

## 2013-11-23 MED ORDER — ADULT MULTIVITAMIN W/MINERALS CH
1.0000 | ORAL_TABLET | Freq: Every day | ORAL | Status: DC
  Administered 2013-11-24 – 2013-11-25 (×2): 1 via ORAL
  Filled 2013-11-23 (×2): qty 1

## 2013-11-23 NOTE — Telephone Encounter (Signed)
Pt wants to talk to Dr Lovena Le, made the earliest appt available on August 17th.
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
## 2013-11-23 NOTE — Telephone Encounter (Signed)
Referred to [place]

## 2013-11-23 NOTE — Telephone Encounter (Signed)
This is a patient of Dr. Lovena Le who is currently on vacation. I have been asked to review the test results. Cardiolite is as follows:  IMPRESSION:  Abnormal, but relatively low risk Lexiscan Cardiolite. There were no  diagnostic ST segment abnormalities to indicate ischemia. Perfusion  imaging is most consistent with scar in the mid to basal  inferoseptal wall with mild ischemia in the basal lateral wall. LVEF  is normal at 58% with mild inferior basal hypokinesis and normal  volumes.  Echocardiogram is as follows:   Impressions:  - Mild LVH with LVEF 55-60%, basal inferior and inferoseptal hypokinesis, grade 2 diastolic dysfunction. Mild left atrial enlargement. Moderate, posteriorly directed mitral regurgitation. Mild to moderate aortic regurgitation. PASP 41 mmHg.  I reviewed Dr. Tanna Furry recent note. There was concern about dyspnea on exertion being a potential anginal equivalent. At baseline, heart rate and blood pressure look to be well controlled. It is reassuring that he has normal LV systolic function, does have grade 2 diastolic dysfunction which could be contributing. The stress test shows an area of scar with mild ischemia as detailed, and this is consistent with underlying CAD, perhaps some graft disease has also developed. The stress test is overall low risk, however if his symptoms are worsening, a cardiac catheterization can always be considered to further evaluate for progressive native and graft disease that might be managed with revascularization. Would recommend the patient followup with Dr. Lovena Le to discuss these options further.  Satira Sark, M.D., F.A.C.C.

## 2013-11-23 NOTE — Telephone Encounter (Signed)
Follow up   ° ° °Calling for test results.   °

## 2013-11-23 NOTE — Telephone Encounter (Signed)
MW spoke with pt.

## 2013-11-23 NOTE — Progress Notes (Signed)
ANTICOAGULATION CONSULT NOTE - Initial Consult  Pharmacy Consult for Heparin Indication: chest pain/ACS  No Known Allergies  Patient Measurements: Height: 5\' 10"  (177.8 cm) Weight: 185 lb 4.8 oz (84.052 kg) IBW/kg (Calculated) : 73 Heparin Dosing Weight: 84kg  Vital Signs: Temp: 97.6 F (36.4 C) (07/29 2135) Temp src: Oral (07/29 2135) BP: 146/74 mmHg (07/29 2135) Pulse Rate: 52 (07/29 2135)  Labs:  Recent Labs  11/23/13 1358  HGB 14.7  HCT 42.8  PLT 138*  CREATININE 1.47*    Estimated Creatinine Clearance: 46.9 ml/min (by C-G formula based on Cr of 1.47).   Medical History: Past Medical History  Diagnosis Date  . Ischemic heart disease   . Hypertension   . Hyperlipidemia   . Allergic rhinitis   . Lumbar disc disease   . GERD (gastroesophageal reflux disease)   . H/O hiatal hernia   . History of skin cancer     bilateral arms with removal    Medications:  Prescriptions prior to admission  Medication Sig Dispense Refill  . aspirin (ADULT ASPIRIN EC LOW STRENGTH) 81 MG EC tablet Take 81 mg by mouth every morning.       [name] atenolol-chlorthalidone (TENORETIC) 50-25 MG per tablet Take 1 tablet by mouth daily.      . Cholecalciferol (VITAMIN D) 2000 UNITS tablet Take 2,000 Units by mouth daily.       . clotrimazole-betamethasone (LOTRISONE) cream Apply 1 application topically 2 (two) times daily as needed. Apply to rash      . doxazosin (CARDURA) 4 MG tablet One in am and one at bedtime  180 tablet  3  . ibuprofen (ADVIL,MOTRIN) 200 MG tablet Take 400 mg by mouth 2 (two) times daily as needed for moderate pain.      . mometasone (NASONEX) 50 MCG/ACT nasal spray Place 2 sprays into the nose at bedtime.      . Multiple Vitamin (MULTIVITAMIN WITH MINERALS) TABS Take 1 tablet by mouth daily.       [name] omeprazole (PRILOSEC) 20 MG capsule Take 20 mg by mouth 2 (two) times daily.      [name] oxymetazoline (AFRIN) 0.05 % nasal spray Place 2 sprays into the nose daily as needed  for congestion. Dryness      . simvastatin (ZOCOR) 20 MG tablet Take 20 mg by mouth every evening.        Assessment: 73 yo M admitted 11/23/2013  With chest pain and near syncope.  Pharmacy consulted to dose heparin.  PMH: CAD, CABG 2002, HTN, hyperlipidemia, GERD, lumbar disk disease.  Coag: ACS. To start heparin.  Goal of Therapy:  Heparin level 0.3-0.7 units/ml Monitor platelets by anticoagulation protocol: Yes   Plan:  Give 4000 units bolus x 1 Start heparin infusion at 1000 units/hr Check anti-Xa level in 6 hours and daily while on heparin Continue to monitor H&H and platelets  Thank you for allowing pharmacy to be a part of this patients care team.  Rowe Robert Pharm.D., BCPS, AQ-Cardiology Clinical Pharmacist 11/23/2013 9:55 PM Pager: [phone] Phone: [phone]

## 2013-11-23 NOTE — ED Provider Notes (Signed)
CSN: [id number]     Arrival date & time 11/23/13  1347 History   First MD Initiated Contact with Patient 11/23/13 1657     Chief Complaint  Patient presents with  . Chest Pain  . Shortness of Breath     (Consider location/radiation/quality/duration/timing/severity/associated sxs/prior Treatment) HPI  Isaac [name] is a 73 y.o. male who presents for evaluation of chest pain and shortness of breath. Symptoms ongoing for 3-6 weeks. He also has weakness with ambulation and can only walk about 75 feet, before he has to sit down and rest. He is seeing his cardiologist for evaluation and last week, had a cardiac echo and stress test. He has been communicating with his cardiologist office, regarding these results, but he remains concerned that he needs to have further testing done or more specific testing to find a source for his discomfort. So far, his tests have been unrevealing. He has been offered office followup on August 17, but does not want to wait for that. He reports having a poor appetite for many months. He denies fever, chills, cough, dizziness, paresthesias, isolated, weakness, or change in bowel and urinary habits. There are no other known modifying factors.   Past Medical History  Diagnosis Date  . Ischemic heart disease   . Arthralgia   . Hypertension   . Hyperlipidemia   . Allergic rhinitis   . Health maintenance examination     Td 2/06; Pneumovax 12/06; CPX 07/04/10; [name]  . Arthritis   . GERD (gastroesophageal reflux disease)   . Heart attack   . Left groin pain 08/07/2011  . H/O hiatal hernia   . History of skin cancer     bilateral arms with removal   Past Surgical History  Procedure Laterality Date  . Coronary artery bypass graft  2002  . Inguinal hernia repair  1983    left  . Nose surgery  1985  . Colonoscopy    . Eye surgery      bilateral cataract extraction with  IOL  . Groin dissection  01/23/2012    Procedure: GROIN EXPLORATION;  Surgeon: Harl Bowie, MD;  Location: [facility];  Service: General;  Laterality: Left;  Left Inguinal Exploration, release of scar tissue, Placement of Mesh   Family History  Problem Relation Age of Onset  . Breast cancer Mother   . Diabetes Mother   . Hypertension Father   . Dementia Father   . Diabetes Sister   . COPD Brother   . Colon cancer Neg Hx    History  Substance Use Topics  . Smoking status: Never Smoker   . Smokeless tobacco: Current User    Types: Chew  . Alcohol Use: No    Review of Systems  All other systems reviewed and are negative.     Allergies  Review of patient's allergies indicates no known allergies.  Home Medications   Prior to Admission medications   Medication Sig Start Date End Date Taking? Authorizing Provider  aspirin (ADULT ASPIRIN EC LOW STRENGTH) 81 MG EC tablet Take 81 mg by mouth every morning.    Yes Historical Provider, MD  atenolol-chlorthalidone (TENORETIC) 50-25 MG per tablet Take 1 tablet by mouth daily.   Yes Historical Provider, MD  Cholecalciferol (VITAMIN D) 2000 UNITS tablet Take 2,000 Units by mouth daily.    Yes Historical Provider, MD  clotrimazole-betamethasone (LOTRISONE) cream Apply 1 application topically 2 (two) times daily as needed. Apply to rash 11/22/11  Yes Historical Provider,  MD  doxazosin (CARDURA) 4 MG tablet One in am and one at bedtime 03/21/13  Yes Tanda Rockers, MD  ibuprofen (ADVIL,MOTRIN) 200 MG tablet Take 400 mg by mouth 2 (two) times daily as needed for moderate pain.   Yes Historical Provider, MD  mometasone (NASONEX) 50 MCG/ACT nasal spray Place 2 sprays into the nose at bedtime.   Yes Historical Provider, MD  Multiple Vitamin (MULTIVITAMIN WITH MINERALS) TABS Take 1 tablet by mouth daily.    Yes Historical Provider, MD  omeprazole (PRILOSEC) 20 MG capsule Take 20 mg by mouth 2 (two) times daily.   Yes Historical Provider, MD  oxymetazoline (AFRIN) 0.05 % nasal spray Place 2 sprays into the nose daily as needed for  congestion. Dryness   Yes Historical Provider, MD  simvastatin (ZOCOR) 20 MG tablet Take 20 mg by mouth every evening.   Yes Historical Provider, MD   BP 153/64  Pulse 60  Temp(Src) 98.5 F (36.9 C) (Oral)  Resp 18  Ht 5\' 10"  (1.778 m)  Wt 188 lb (85.276 kg)  BMI 26.98 kg/m2  SpO2 98% Physical Exam  Nursing note and vitals reviewed. Constitutional: He is oriented to person, place, and time. He appears well-developed and well-nourished. He appears distressed (he is anxious).  Frail, elderly  HENT:  Head: Normocephalic and atraumatic.  Right Ear: External ear normal.  Left Ear: External ear normal.  Eyes: Conjunctivae and EOM are normal. Pupils are equal, round, and reactive to light.  Neck: Normal range of motion and phonation normal. Neck supple.  Cardiovascular: Normal rate, regular rhythm, normal heart sounds and intact distal pulses.   Pulmonary/Chest: Effort normal and breath sounds normal. He exhibits no bony tenderness.  Abdominal: Soft. There is no tenderness.  Musculoskeletal: Normal range of motion. He exhibits no edema and no tenderness.  Neurological: He is alert and oriented to person, place, and time. No cranial nerve deficit or sensory deficit. He exhibits normal muscle tone. Coordination normal.  Skin: Skin is warm, dry and intact.  Psychiatric: He has a normal mood and affect. His behavior is normal. Judgment and thought content normal.    ED Course  Procedures (including critical care time)  Medications - No data to display  Patient Vitals for the past 24 hrs:  BP Temp Temp src Pulse Resp SpO2 Height Weight  11/23/13 1651 153/64 mmHg - - 60 18 98 % - -  11/23/13 1354 121/63 mmHg 98.5 F (36.9 C) Oral 70 18 97 % 5\' 10"  (1.778 m) 188 lb (85.276 kg)    5:17 PM-Consult complete with Cardiology-  Dr. Oran Rein. Patient case explained and discussed. He agrees to admit patient for further evaluation and treatment. Call ended at 18:40     Labs Review Labs  Reviewed  CBC - Abnormal; Notable for the following:    Platelets 138 (*)    All other components within normal limits  BASIC METABOLIC PANEL - Abnormal; Notable for the following:    Creatinine, Ser 1.47 (*)    GFR calc non Af Amer 46 (*)    GFR calc Af Amer 53 (*)    Anion gap 16 (*)    All other components within normal limits  I-STAT TROPOININ, ED    Imaging Review Dg Chest 2 View  11/23/2013   CLINICAL DATA:  Chest pain  EXAM: CHEST  2 VIEW  COMPARISON:  11/08/2013  FINDINGS: Postsurgical changes are noted. The lungs are well aerated bilaterally. Chronic compression deformities are noted in  the thoracolumbar junction. No acute abnormality is seen.  IMPRESSION: No acute abnormality noted.   Electronically Signed   By: Inez Catalina M.D.   On: 11/23/2013 15:00     EKG Interpretation None       Date: 11/23/13  Rate:  72  Rhythm: normal sinus rhythm  QRS Axis: normal  PR and QT Intervals: normal  ST/T Wave abnormalities: lateral T wave inversion  PR and QRS Conduction Disutrbances:none  Narrative Interpretation:   Old EKG Reviewed: changes noted    MDM   Final diagnoses:  Essential hypertension  Coronary artery disease involving native coronary artery of native heart with angina pectoris  Dyspnea    Nonspecific sx, progressive. He needs evaluation by cardiology.  Nursing Notes Reviewed/ Care Coordinated Applicable Imaging Reviewed Interpretation of Laboratory Data incorporated into ED treatment  Plan: Admit  Richarda Blade, MD 11/25/13 1159

## 2013-11-23 NOTE — Telephone Encounter (Signed)
Please advise due to MD absence (stress)

## 2013-11-23 NOTE — Telephone Encounter (Signed)
Called and spoke with pt and he stated that he cannot get the results of his echo and stress test that was done last week.  He stated that did call him back this morning and wanted him to come in and he does not want to be seen in [place] anymore, so they scheduled him an appt in [place] but not until August 17---pt stated that he wants to figure out about his breathing---he is having difficulty this morning with some chest tightness.  Pt wanted to see if MW can review his results.  Please advise. Thanks  No Known Allergies  Current Outpatient Prescriptions on File Prior to Visit  Medication Sig Dispense Refill  . aspirin (ADULT ASPIRIN EC LOW STRENGTH) 81 MG EC tablet Take 81 mg by mouth every morning. Will stop 5 days pre op      . atenolol-chlorthalidone (TENORETIC) 50-25 MG per tablet TAKE 1 TABLET DAILY  90 tablet  3  . Cholecalciferol (VITAMIN D) 2000 UNITS tablet Take 2,000 Units by mouth daily.       . clotrimazole-betamethasone (LOTRISONE) cream Apply 1 application topically 2 (two) times daily as needed. Apply to rash      . doxazosin (CARDURA) 4 MG tablet One in am and one at bedtime  180 tablet  3  . Multiple Vitamin (MULTIVITAMIN WITH MINERALS) TABS Take 1 tablet by mouth daily. Will stop  5 days pre op      . NASONEX 50 MCG/ACT nasal spray USE TWO SPRAY EACH NOSTRIL TWICE DAILY  17 g  5  . omeprazole (PRILOSEC) 20 MG capsule TAKE 1 CAPSULE TWICE DAILY  180 capsule  3  . oxymetazoline (AFRIN) 0.05 % nasal spray Place 2 sprays into the nose as needed. Dryness      . simvastatin (ZOCOR) 20 MG tablet TAKE 1 TABLET AT BEDTIME  90 tablet  3   No current facility-administered medications on file prior to visit.

## 2013-11-23 NOTE — ED Notes (Signed)
Patient states he has been having chest pain off and on for 3 wks.   Shortness of breath for 6 weeks.  Patient states had echo and stress test done on Friday of last week.  Patient states symptoms persist.   Patient states his episode today was worse.  Patient states he got lightheaded "and I liked to pass out".  Patient denies actually having syncopal episode.  Patient denies other symptoms.

## 2013-11-24 ENCOUNTER — Encounter (HOSPITAL_COMMUNITY): Payer: Self-pay | Admitting: Physician Assistant

## 2013-11-24 ENCOUNTER — Encounter (HOSPITAL_COMMUNITY)
Admission: EM | Disposition: A | Discharge status: Home / Self Care | Payer: Self-pay | Source: Home / Self Care | Attending: Internal Medicine

## 2013-11-24 DIAGNOSIS — I10 Essential (primary) hypertension: Secondary | ICD-10-CM | POA: Diagnosis present

## 2013-11-24 DIAGNOSIS — E785 Hyperlipidemia, unspecified: Secondary | ICD-10-CM

## 2013-11-24 DIAGNOSIS — I2510 Atherosclerotic heart disease of native coronary artery without angina pectoris: Secondary | ICD-10-CM

## 2013-11-24 HISTORY — PX: LEFT HEART CATHETERIZATION WITH CORONARY ANGIOGRAM: SHX5451

## 2013-11-24 LAB — COMPREHENSIVE METABOLIC PANEL
ALBUMIN: 3.7 g/dL (ref 3.5–5.2)
ALK PHOS: 69 U/L (ref 39–117)
ALT: 34 U/L (ref 0–53)
AST: 29 U/L (ref 0–37)
Anion gap: 13 (ref 5–15)
BILIRUBIN TOTAL: 0.4 mg/dL (ref 0.3–1.2)
BUN: 20 mg/dL (ref 6–23)
CHLORIDE: 102 meq/L (ref 96–112)
CO2: 25 meq/L (ref 19–32)
CREATININE: 1.29 mg/dL (ref 0.50–1.35)
Calcium: 9.1 mg/dL (ref 8.4–10.5)
GFR calc Af Amer: 62 mL/min — ABNORMAL LOW (ref >90)
GFR, EST NON AFRICAN AMERICAN: 54 mL/min — AB (ref >90)
Glucose, Bld: 122 mg/dL — ABNORMAL HIGH (ref 70–99)
POTASSIUM: 3.9 meq/L (ref 3.7–5.3)
Sodium: 140 mEq/L (ref 137–147)
Total Protein: 7.5 g/dL (ref 6.0–8.3)

## 2013-11-24 LAB — LIPID PANEL
CHOL/HDL RATIO: 2.5 ratio
CHOLESTEROL: 138 mg/dL (ref 0–200)
HDL: 56 mg/dL (ref >39)
LDL CALC: 56 mg/dL (ref 0–99)
Triglycerides: 131 mg/dL (ref <150)
VLDL: 26 mg/dL (ref 0–40)

## 2013-11-24 LAB — CBC
HCT: 41.1 % (ref 39.0–52.0)
Hemoglobin: 14 g/dL (ref 13.0–17.0)
MCH: 32.1 pg (ref 26.0–34.0)
MCHC: 34.1 g/dL (ref 30.0–36.0)
MCV: 94.3 fL (ref 78.0–100.0)
PLATELETS: 149 10*3/uL — AB (ref 150–400)
RBC: 4.36 MIL/uL (ref 4.22–5.81)
RDW: 13.1 % (ref 11.5–15.5)
WBC: 7.1 10*3/uL (ref 4.0–10.5)

## 2013-11-24 LAB — CREATININE, SERUM
CREATININE: 1.4 mg/dL — AB (ref 0.50–1.35)
GFR calc Af Amer: 56 mL/min — ABNORMAL LOW (ref >90)
GFR calc non Af Amer: 49 mL/min — ABNORMAL LOW (ref >90)

## 2013-11-24 LAB — TROPONIN I: Troponin I: <0.3 ng/mL (ref <0.30)

## 2013-11-24 LAB — HEPARIN LEVEL (UNFRACTIONATED): Heparin Unfractionated: 0.16 IU/mL — ABNORMAL LOW (ref 0.30–0.70)

## 2013-11-24 SURGERY — LEFT HEART CATHETERIZATION WITH CORONARY ANGIOGRAM
Anesthesia: LOCAL

## 2013-11-24 MED ORDER — HEPARIN SODIUM (PORCINE) 5000 UNIT/ML IJ SOLN
5000.0000 [IU] | Freq: Three times a day (TID) | INTRAMUSCULAR | Status: DC
  Administered 2013-11-24 – 2013-11-25 (×2): 5000 [IU] via SUBCUTANEOUS
  Filled 2013-11-24 (×5): qty 1

## 2013-11-24 MED ORDER — MIDAZOLAM HCL 2 MG/2ML IJ SOLN
INTRAMUSCULAR | Status: AC
  Filled 2013-11-24: qty 2

## 2013-11-24 MED ORDER — HEPARIN BOLUS VIA INFUSION
2000.0000 [IU] | Freq: Once | INTRAVENOUS | Status: AC
  Administered 2013-11-24: 2000 [IU] via INTRAVENOUS
  Filled 2013-11-24: qty 2000

## 2013-11-24 MED ORDER — LIDOCAINE HCL (PF) 1 % IJ SOLN
INTRAMUSCULAR | Status: AC
  Filled 2013-11-24: qty 30

## 2013-11-24 MED ORDER — VERAPAMIL HCL 2.5 MG/ML IV SOLN
INTRAVENOUS | Status: AC
  Filled 2013-11-24: qty 2

## 2013-11-24 MED ORDER — NITROGLYCERIN 1 MG/10 ML FOR IR/CATH LAB
INTRA_ARTERIAL | Status: AC
  Filled 2013-11-24: qty 10

## 2013-11-24 MED ORDER — FENTANYL CITRATE 0.05 MG/ML IJ SOLN
INTRAMUSCULAR | Status: AC
  Filled 2013-11-24: qty 2

## 2013-11-24 MED ORDER — SODIUM CHLORIDE 0.9 % IV SOLN
1.0000 mL/kg/h | INTRAVENOUS | Status: AC
  Administered 2013-11-24: 1 mL/kg/h via INTRAVENOUS

## 2013-11-24 MED ORDER — HEPARIN (PORCINE) IN NACL 2-0.9 UNIT/ML-% IJ SOLN
INTRAMUSCULAR | Status: AC
  Filled 2013-11-24: qty 1500

## 2013-11-24 NOTE — Progress Notes (Signed)
Utilization review completed. Julya Alioto, RN, BSN. 

## 2013-11-24 NOTE — Progress Notes (Signed)
ANTICOAGULATION CONSULT NOTE  Pharmacy Consult for Heparin Indication: chest pain/ACS  No Known Allergies  Patient Measurements: Height: 5\' 10"  (177.8 cm) Weight: 185 lb 4.8 oz (84.052 kg) IBW/kg (Calculated) : 73 Heparin Dosing Weight: 84kg  Vital Signs: Temp: 97.6 F (36.4 C) (07/29 2135) Temp src: Oral (07/29 2135) BP: 146/74 mmHg (07/29 2135) Pulse Rate: 52 (07/29 2135)  Labs:  Recent Labs  11/23/13 1358 11/23/13 2245 11/24/13 0348  HGB 14.7  --  14.0  HCT 42.8  --  41.1  PLT 138*  --  149*  LABPROT  --  14.3  --   INR  --  1.11  --   HEPARINUNFRC  --   --  0.16*  CREATININE 1.47* 1.29  --   TROPONINI  --  <0.30 <0.30    Estimated Creatinine Clearance: 53.4 ml/min (by C-G formula based on Cr of 1.29).  Assessment: 73 y.o. male with chest pain for heparin   Goal of Therapy:  Heparin level 0.3-0.7 units/ml Monitor platelets by anticoagulation protocol: Yes   Plan:  Heparin 2000 units IV bolus, then increase heparin 1200 units/hr F/U after cath  Phillis Knack, PharmD, BCPS

## 2013-11-24 NOTE — Interval H&P Note (Signed)
History and Physical Interval Note:  11/24/2013 10:04 AM  Isaac [name]  has presented today for surgery, with the diagnosis of chest pain  The various methods of treatment have been discussed with the patient and family. After consideration of risks, benefits and other options for treatment, the patient has consented to  Procedure(s): LEFT HEART CATHETERIZATION WITH CORONARY ANGIOGRAM (N/A) as a surgical intervention .  The patient's history has been reviewed, patient examined, no change in status, stable for surgery.  I have reviewed the patient's chart and labs.  Questions were answered to the patient's satisfaction.   Cath Lab Visit (complete for each Cath Lab visit)  Clinical Evaluation Leading to the Procedure:   ACS: Yes.    Non-ACS:    Anginal Classification: CCS IV  Anti-ischemic medical therapy: Minimal Therapy (1 class of medications)  Non-Invasive Test Results: Low-risk stress test findings: cardiac mortality <1%/year  Prior CABG: Previous CABG        Collier Salina [facility] 11/24/2013 10:04 AM

## 2013-11-24 NOTE — Progress Notes (Addendum)
Patient Name: Isaac [name] Date of Encounter: 11/24/2013  Principal Problem:   Unstable angina Active Problems:   Hyperlipidemia   CAD (coronary artery disease), native coronary artery - LIMA-LAD, SVG-Diag, SVG-RCA 2002   Hypertension    Patient Profile: 73 yo male w/ hx CABG 2002, LIMA-LAD, SVG-Diag, SVG-RCA, EF 55-60% w/ grade 2 diastolic  dysf., HTN, HLD, GERD. Progressive chest pain/SOB-->LexiMV w/ scar and mild isch basal lateral wall on 07/24. Sx worsened so he came to the ER 07/29. Cath planned later today.  SUBJECTIVE: No more chest pain, no SOB  OBJECTIVE Filed Vitals:   11/23/13 2030 11/23/13 2135 11/23/13 2147 11/24/13 0502  BP: 163/73 146/74  115/59  Pulse: 52 52  64  Temp:  97.6 F (36.4 C)  98.3 F (36.8 C)  TempSrc:  Oral  Oral  Resp:  16  18  Height:      Weight:  185 lb 4.8 oz (84.052 kg) 185 lb 4.8 oz (84.052 kg)   SpO2: 100% 99%  95%    Intake/Output Summary (Last 24 hours) at 11/24/13 0904 Last data filed at 11/24/13 0700  Gross per 24 hour  Intake      0 ml  Output      0 ml  Net      0 ml   Filed Weights   11/23/13 1354 11/23/13 2135 11/23/13 2147  Weight: 188 lb (85.276 kg) 185 lb 4.8 oz (84.052 kg) 185 lb 4.8 oz (84.052 kg)    PHYSICAL EXAM General: Well developed, well nourished, male in no acute distress. Head: Normocephalic, atraumatic.  Neck: Supple without bruits, JVD not elevated. Lungs:  Resp regular and unlabored, CTA bilaterally. Heart: RRR, S1, S2, no S3, S4, or murmur; no rub. Abdomen: Soft, non-tender, non-distended, BS + x 4.  Extremities: No clubbing, cyanosis, no edema.  Neuro: Alert and oriented X 3. Moves all extremities spontaneously. Psych: Normal affect.  LABS: CBC:  Recent Labs  11/23/13 1358 11/24/13 0348  WBC 6.5 7.1  HGB 14.7 14.0  HCT 42.8 41.1  MCV 95.1 94.3  PLT 138* 149*   INR:  Recent Labs  11/23/13 2245  INR 6.60   Basic Metabolic Panel:  Recent Labs  11/23/13 1358  11/23/13 2245  NA 142 140  K 4.2 3.9  CL 99 102  CO2 27 25  GLUCOSE 83 122*  BUN 20 20  CREATININE 1.47* 1.29  CALCIUM 9.5 9.1   Liver Function Tests:  Recent Labs  11/23/13 2245  AST 29  ALT 34  ALKPHOS 69  BILITOT 0.4  PROT 7.5  ALBUMIN 3.7   Cardiac Enzymes:  Recent Labs  11/23/13 2245 11/24/13 0348  TROPONINI <0.30 <0.30    Recent Labs  11/23/13 1406  TROPIPOC 0.01   Fasting Lipid Panel:  Recent Labs  11/24/13 0348  CHOL 138  HDL 56  LDLCALC 56  TRIG 131  CHOLHDL 2.5   TELE:  SR, sinus brady, occasionally high 40s while asleep.      Radiology/Studies: Dg Chest 2 View 11/23/2013   CLINICAL DATA:  Chest pain  EXAM: CHEST  2 VIEW  COMPARISON:  11/08/2013  FINDINGS: Postsurgical changes are noted. The lungs are well aerated bilaterally. Chronic compression deformities are noted in the thoracolumbar junction. No acute abnormality is seen.  IMPRESSION: No acute abnormality noted.   Electronically Signed   By: Inez Catalina M.D.   On: 11/23/2013 15:00     Current Medications:  . aspirin  EC  81 mg Oral Daily  . atenolol  50 mg Oral Daily  . chlorthalidone  25 mg Oral Daily  . doxazosin  4 mg Oral Daily  . multivitamin with minerals  1 tablet Oral Daily  . pantoprazole  40 mg Oral Daily  . simvastatin  20 mg Oral QPM  . sodium chloride  3 mL Intravenous Q12H   . sodium chloride 1 mL/kg/hr (11/24/13 0400)  . heparin 1,200 Units/hr (11/24/13 0501)    ASSESSMENT AND PLAN: Principal Problem:   Unstable angina - for cath later today, OK to have clear liquids till 10 am.  Active Problems:   Hyperlipidemia - good control, profile is above, no med changes    CAD (coronary artery disease), native coronary artery - LIMA-LAD, SVG-Diag, SVG-RCA 2002 - on ASA, BB, statin    Hypertension - BP high on admission, but was low in office 07/20. Follow, with diast dysf on echo, encourage pt to ck BP at home and report results.  Plan - f/u on cath  results.  Signed, Rosaria Ferries , PA-C 9:04 AM 11/24/2013   I have examined the patient and reviewed assessment and plan and discussed with patient.  Agree with above as stated.  Left radial site intact.  No clear etiology of near syncope.  Will watch on tele overnight, to see if there is any severe bradycardia.  Wife reports severe DOE as well.  He has minimized his walking.  CXR was unremarkable.  BNP only mildly elevated-would not expect such severe DOE.  Consider trial of diuretic to see if this helps.  Consider PE evaluation.  D dimer was ordered by Dr. Melvyn Novas.  He walked in the office and there was no objective findings with Dr. Melvyn Novas on 7/14.    Alynn Ellithorpe S.

## 2013-11-24 NOTE — CV Procedure (Signed)
    Cardiac Catheterization Procedure Note  Name: Isaac [name] MRN: [id number] DOB: 04-May-1940  Procedure: Left Heart Cath, Selective Coronary Angiography, SVG and LIMA angiography, LV angiography  Indication: 73 yo WM with history of CAD s/p CABG presents with symptoms of dyspnea for 6 weeks and new episode of near syncope. Recent myoview was low risk with inferoseptal scar and mild inferolateral ischemia at the base.   Procedural Details: The left wrist was prepped, draped, and anesthetized with 1% lidocaine. Using the modified Seldinger technique, a 6 French slender sheath was introduced into the left radial artery. 3 mg of verapamil was administered through the sheath, weight-based unfractionated heparin was administered intravenously. Standard Judkins catheters were used for selective coronary angiography and left ventriculography. Catheter exchanges were performed over an exchange length guidewire. There were no immediate procedural complications. A TR band was used for radial hemostasis at the completion of the procedure.  The patient was transferred to the post catheterization recovery area for further monitoring.  Procedural Findings: Hemodynamics: AO 127/56 mean 84 mm Hg LV 129/13  Coronary angiography: Coronary dominance: left  Left mainstem: Normal.  Left anterior descending (LAD): 100% occlusion proximally. The first diagonal is occluded.  Left circumflex (LCx): 100% occlusion after the takeoff of a very large OM1. The distal LCx including PDA and PLOM branches fill by collaterals from the LAD.  Right coronary artery (RCA): Nondominant. There is a 90-95% proximal stenosis. There is a significant change in vessel caliber across the lesion and the vessel is less than 2 mm distally.  SVG to diagonal is widely patent.  SVG to the RCA is occluded. This appears chronic.  LIMA to the LAD is widely patent.   Left ventriculography: Left ventricular systolic function is overall  normal, LVEF is estimated at 55%, there is inferobasal severe hypokinesis. There is no significant mitral regurgitation   Final Conclusions:   1. Severe 3 vessel obstructive CAD.  2. Patent LIMA to the LAD 3. Patent SVG to diagonal 4. Occluded SVG to nondominant RCA 5. Good LV function.  Recommendations: The only territories that are not revascularized are the distal LCx and nondominant RCA. The LCx occlusion is old and was not bypassed because it was too small. I think it is very unlikely that his current symptoms are related to a small nondominant RCA. I would recommend medical therapy and consideration of other causes for his symptoms.   [name], [place]  11/24/2013, 10:42 AM

## 2013-11-25 ENCOUNTER — Other Ambulatory Visit: Payer: Self-pay | Admitting: Physician Assistant

## 2013-11-25 ENCOUNTER — Encounter (HOSPITAL_COMMUNITY): Payer: Self-pay | Admitting: Cardiology

## 2013-11-25 DIAGNOSIS — R0989 Other specified symptoms and signs involving the circulatory and respiratory systems: Secondary | ICD-10-CM

## 2013-11-25 DIAGNOSIS — N183 Chronic kidney disease, stage 3 unspecified: Secondary | ICD-10-CM

## 2013-11-25 DIAGNOSIS — R0609 Other forms of dyspnea: Secondary | ICD-10-CM

## 2013-11-25 DIAGNOSIS — I25810 Atherosclerosis of coronary artery bypass graft(s) without angina pectoris: Secondary | ICD-10-CM

## 2013-11-25 DIAGNOSIS — I2510 Atherosclerotic heart disease of native coronary artery without angina pectoris: Secondary | ICD-10-CM | POA: Diagnosis present

## 2013-11-25 DIAGNOSIS — I209 Angina pectoris, unspecified: Secondary | ICD-10-CM

## 2013-11-25 HISTORY — DX: Atherosclerosis of coronary artery bypass graft(s) without angina pectoris: I25.810

## 2013-11-25 LAB — CBC
HCT: 42 % (ref 39.0–52.0)
Hemoglobin: 14.4 g/dL (ref 13.0–17.0)
MCH: 32.4 pg (ref 26.0–34.0)
MCHC: 34.3 g/dL (ref 30.0–36.0)
MCV: 94.6 fL (ref 78.0–100.0)
PLATELETS: 139 10*3/uL — AB (ref 150–400)
RBC: 4.44 MIL/uL (ref 4.22–5.81)
RDW: 13.2 % (ref 11.5–15.5)
WBC: 7.2 10*3/uL (ref 4.0–10.5)

## 2013-11-25 MED ORDER — ATENOLOL 50 MG PO TABS: 50.0000 mg | ORAL_TABLET | Freq: Every day | ORAL | Status: DC

## 2013-11-25 MED ORDER — ISOSORBIDE MONONITRATE ER 30 MG PO TB24: 30.0000 mg | ORAL_TABLET | Freq: Every day | ORAL | Status: DC

## 2013-11-25 MED ORDER — ISOSORBIDE MONONITRATE ER 30 MG PO TB24
30.0000 mg | ORAL_TABLET | Freq: Every day | ORAL | Status: DC
  Administered 2013-11-25: 30 mg via ORAL
  Filled 2013-11-25 (×2): qty 1

## 2013-11-25 MED ORDER — NITROGLYCERIN 0.4 MG SL SUBL: 0.4000 mg | SUBLINGUAL_TABLET | SUBLINGUAL | Status: DC | PRN

## 2013-11-25 MED ORDER — FUROSEMIDE 40 MG PO TABS: 40.0000 mg | ORAL_TABLET | Freq: Every day | ORAL | Status: DC

## 2013-11-25 NOTE — Progress Notes (Signed)
73 year old male has a history of coronary artery disease with bypass grafting in 2002 at the time of surgery the circumflex was too small to graft. He had LIMA to the LAD and vein grafts to the diagonal and right coronary artery. He had done well until about 6 weeks ago when he began to have progressive shortness and chest pain.  Admitted neg for MI.  Cath yesterday see below.  Medical therapy.   Subjective: No pain  Objective: Vital signs in last 24 hours: Temp:  [97.2 F (36.2 C)-99 F (37.2 C)] 98 F (36.7 C) (07/31 0509) Pulse Rate:  [56-80] 65 (07/31 0509) Resp:  [16-19] 16 (07/31 0509) BP: (123-141)/(63-68) 129/67 mmHg (07/31 0509) SpO2:  [94 %-99 %] 98 % (07/31 0509) Weight change:  Last BM Date: 11/24/13 Intake/Output from previous day: -840 07/30 0701 - 07/31 0700 In: 684.3 [P.O.:240; I.V.:444.3] Out: 1525 [Urine:1525] Intake/Output this shift:    PE: General:Pleasant affect, NAD Skin:Warm and dry, brisk capillary refill HEENT:normocephalic, sclera clear, mucus membranes moist Heart:S1S2 RRR without murmur, gallup, rub or click Lungs:clear without rales, rhonchi, or wheezes STM:HDQQ, non tender, + BS, do not palpate liver spleen or masses Ext:no lower ext edema, 2+ pedal pulses, 2+ radial pulses, lt radial cath site without hematoma Neuro:alert and oriented, MAE, follows commands, + facial symmetry   Lab Results:  Recent Labs  11/24/13 0348 11/25/13 0630  WBC 7.1 7.2  HGB 14.0 14.4  HCT 41.1 42.0  PLT 149* 139*   BMET  Recent Labs  11/23/13 1358 11/23/13 2245 11/24/13 0348  NA 142 140  --   K 4.2 3.9  --   CL 99 102  --   CO2 27 25  --   GLUCOSE 83 122*  --   BUN 20 20  --   CREATININE 1.47* 1.29 1.40*  CALCIUM 9.5 9.1  --     Recent Labs  11/23/13 2245 11/24/13 0348  TROPONINI <0.30 <0.30    Lab Results  Component Value Date   CHOL 138 11/24/2013   HDL 56 11/24/2013   LDLCALC 56 11/24/2013   TRIG 131 11/24/2013   CHOLHDL  2.5 11/24/2013   No results found for this basename: HGBA1C     Lab Results  Component Value Date   TSH 2.32 11/08/2013    Hepatic Function Panel  Recent Labs  11/23/13 2245  PROT 7.5  ALBUMIN 3.7  AST 29  ALT 34  ALKPHOS 69  BILITOT 0.4    Recent Labs  11/24/13 0348  CHOL 138   No results found for this basename: PROTIME,  in the last 72 hours     Studies/Results: Dg Chest 2 View  11/23/2013   CLINICAL DATA:  Chest pain  EXAM: CHEST  2 VIEW  COMPARISON:  11/08/2013  FINDINGS: Postsurgical changes are noted. The lungs are well aerated bilaterally. Chronic compression deformities are noted in the thoracolumbar junction. No acute abnormality is seen.  IMPRESSION: No acute abnormality noted.   Electronically Signed   By: Inez Catalina M.D.   On: 11/23/2013 15:00   Cardiac cath:  First cath since CABG Left mainstem: Normal.  Left anterior descending (LAD): 100% occlusion proximally. The first diagonal is occluded.  Left circumflex (LCx): 100% occlusion after the takeoff of a very large OM1. The distal LCx including PDA and PLOM branches fill by collaterals from the LAD.  Right coronary artery (RCA): Nondominant. There is a 90-95% proximal stenosis. There is  a significant change in vessel caliber across the lesion and the vessel is less than 2 mm distally.  SVG to diagonal is widely patent.  SVG to the RCA is occluded. This appears chronic.  LIMA to the LAD is widely patent.  Left ventriculography: Left ventricular systolic function is overall normal, LVEF is estimated at 55%, there is inferobasal severe hypokinesis. There is no significant mitral regurgitation  Final Conclusions:  1. Severe 3 vessel obstructive CAD.  2. Patent LIMA to the LAD  3. Patent SVG to diagonal  4. Occluded SVG to nondominant RCA  5. Good LV function.  Recommendations: The only territories that are not revascularized are the distal LCx and nondominant RCA. The LCx occlusion is old and was not  bypassed because it was too small. Dr. P. Martinique thinks it is very unlikely that his current symptoms are related to a small nondominant RCA. Dr. Martinique would recommend medical therapy and consideration of other causes for his symptoms.   ECHO:  Done as outpt.  Left ventricle: The cavity size was normal. Wall thickness was increased in a pattern of mild LVH. Systolic function was normal. The estimated ejection fraction was in the range of 55% to 60%. There is hypokinesis of the basalinferior and inferoseptal myocardium. Features are consistent with a pseudonormal left ventricular filling pattern, with concomitant abnormal relaxation and increased filling pressure (grade 2 diastolic dysfunction). - Aortic valve: There was mild to moderate regurgitation. Regurgitation pressure half-time: 551 ms. - Mitral valve: There was moderate regurgitation directed posteriorly. - Left atrium: The atrium was mildly dilated. - Right atrium: Central venous pressure (est): 3 mm Hg. - Tricuspid valve: There was trivial regurgitation. - Pulmonary arteries: PA peak pressure: 41 mm Hg (S). - Pericardium, extracardiac: There was no pericardial effusion. Impressions: - Mild LVH with LVEF 55-60%, basal inferior and inferoseptal hypokinesis, grade 2 diastolic dysfunction. Mild left atrial enlargement. Moderate, posteriorly directed mitral regurgitation. Mild to moderate aortic regurgitation. PASP 41 mmHg   Medications: I have reviewed the patient's current medications. Scheduled Meds: . aspirin EC  81 mg Oral Daily  . atenolol  50 mg Oral Daily  . chlorthalidone  25 mg Oral Daily  . doxazosin  4 mg Oral Daily  . heparin  5,000 Units Subcutaneous 3 times per day  . multivitamin with minerals  1 tablet Oral Daily  . pantoprazole  40 mg Oral Daily  . simvastatin  20 mg Oral QPM   Continuous Infusions:  PRN Meds:.acetaminophen, nitroGLYCERIN, ondansetron (ZOFRAN) IV  Assessment/Plan: Principal Problem:    Unstable angina- neg MI- ambulate, add Imdur, possible discharge Active Problems:   CAD (coronary artery disease) of artery bypass graft, Occluded VG-non dominant LCX medical therapy-- this would correspond with Nuc study inf scar   CAD (coronary artery disease), native coronary artery - LIMA-LAD, SVG-Diag, SVG-RCA 2002   Hyperlipidemia   Hypertension   Hx Barrett's Esophagus    CKD -3   LOS: 2 days   Time spent with pt. :15 minutes. [facility]  Nurse Practitioner Certified Pager [phone] or after 5pm and on weekends call [phone] 11/25/2013, 7:57 AM   I have examined the patient and reviewed assessment and plan and discussed with patient.  Agree with above as stated.  Switch atenolol-chlorthalidone to atenolol 50 mg.  Stop chlorthalidone.  Start Furosemide 40 mg daily.  See if this helps with [facility].  I think a large component of DOE is deconditioning.  I asked him to try to get 30 minutes/day,  5 days /week of walking.  They have a treadmill at home.  Agree with trying Imdur as well. Tele reviewed.  No alarms that would indicate a cause for presyncope. D/C today.     VARANASI,JAYADEEP S.
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
## 2013-11-25 NOTE — Progress Notes (Signed)
Discharge instructions given. Pt verbalized understanding and all questions were answered.  

## 2013-11-27 ENCOUNTER — Other Ambulatory Visit: Payer: Self-pay | Admitting: Internal Medicine

## 2013-11-28 ENCOUNTER — Encounter: Payer: Self-pay | Admitting: Internal Medicine

## 2013-11-28 ENCOUNTER — Ambulatory Visit (INDEPENDENT_AMBULATORY_CARE_PROVIDER_SITE_OTHER): Payer: Medicare Other | Admitting: Internal Medicine

## 2013-11-28 VITALS — BP 100/60 | HR 62 | Ht 70.0 in | Wt 185.0 lb

## 2013-11-28 DIAGNOSIS — I209 Angina pectoris, unspecified: Secondary | ICD-10-CM | POA: Diagnosis not present

## 2013-11-28 DIAGNOSIS — I25119 Atherosclerotic heart disease of native coronary artery with unspecified angina pectoris: Secondary | ICD-10-CM

## 2013-11-28 DIAGNOSIS — I2510 Atherosclerotic heart disease of native coronary artery without angina pectoris: Secondary | ICD-10-CM

## 2013-11-28 DIAGNOSIS — I259 Chronic ischemic heart disease, unspecified: Secondary | ICD-10-CM

## 2013-11-28 DIAGNOSIS — I10 Essential (primary) hypertension: Secondary | ICD-10-CM | POA: Diagnosis not present

## 2013-11-28 NOTE — Assessment & Plan Note (Signed)
I discussed the finding on catheterization. He is instructed to continue his meds and start walking. I also told him about the importance of low sodium and less fat.

## 2013-11-28 NOTE — Patient Instructions (Signed)
Your physician recommends that you schedule a follow-up appointment in: 4 months with Dr Lovena Le Dennis Bast will receive a reminder letter two months in advance reminding you to call and schedule your appointment. If you don't receive this letter, please contact our office.  Your physician recommends that you continue on your current medications as directed. Please refer to the Current Medication list given to you today.

## 2013-11-28 NOTE — Assessment & Plan Note (Signed)
His blood pressure is well controlled. I have asked him to continue his current meds.

## 2013-11-28 NOTE — Progress Notes (Signed)
HPI [name] returns today for followup. He is a pleasant 73 yo man with ischemic heart disease, s/p CABG 13 years ago who developed recurrent chest pressure and sob and was hospitalized and underwent catheterization several days ago. In the interim, he has had some confusion about the results. He had two occluded grafts but a patent lima to the lad and SVG to the diagonal. He is anxious about his activity. He has dietary indiscretion. I have counseled him on the timing of his medical therapy and worked on an exercise prescription.  No Known Allergies   Current Outpatient Prescriptions  Medication Sig Dispense Refill  . aspirin (ADULT ASPIRIN EC LOW STRENGTH) 81 MG EC tablet Take 81 mg by mouth every morning.       [name] atenolol (TENORMIN) 50 MG tablet Take 1 tablet (50 mg total) by mouth daily.  30 tablet  5  . Cholecalciferol (VITAMIN D) 2000 UNITS tablet Take 2,000 Units by mouth daily.       . clotrimazole-betamethasone (LOTRISONE) cream Apply 1 application topically 2 (two) times daily as needed. Apply to rash      . doxazosin (CARDURA) 4 MG tablet One in am and one at bedtime  180 tablet  3  . furosemide (LASIX) 40 MG tablet Take 1 tablet (40 mg total) by mouth daily.  30 tablet  5  . ibuprofen (ADVIL,MOTRIN) 200 MG tablet Take 400 mg by mouth 2 (two) times daily as needed for moderate pain.      . isosorbide mononitrate (IMDUR) 30 MG 24 hr tablet Take 1 tablet (30 mg total) by mouth daily.  30 tablet  5  . mometasone (NASONEX) 50 MCG/ACT nasal spray Place 2 sprays into the nose at bedtime.      . Multiple Vitamin (MULTIVITAMIN WITH MINERALS) TABS Take 1 tablet by mouth daily.       . nitroGLYCERIN (NITROSTAT) 0.4 MG SL tablet Place 1 tablet (0.4 mg total) under the tongue every 5 (five) minutes x 3 doses as needed for chest pain.  25 tablet  12  . omeprazole (PRILOSEC) 20 MG capsule Take 20 mg by mouth 2 (two) times daily.      [name] oxymetazoline (AFRIN) 0.05 % nasal spray Place 2  sprays into the nose daily as needed for congestion. Dryness      . simvastatin (ZOCOR) 20 MG tablet Take 20 mg by mouth every evening.      . simvastatin (ZOCOR) 20 MG tablet TAKE 1 TABLET AT BEDTIME  90 tablet  2   No current facility-administered medications for this visit.     Past Medical History  Diagnosis Date  . Ischemic heart disease   . Hypertension   . Hyperlipidemia   . Allergic rhinitis   . Lumbar disc disease   . GERD (gastroesophageal reflux disease)   . H/O hiatal hernia   . History of skin cancer     bilateral arms with removal  . CAD (coronary artery disease) of artery bypass graft, Occluded VG-non dominant LCX medical therapy 11/25/2013    ROS:   All systems reviewed and negative except as noted in the HPI.   Past Surgical History  Procedure Laterality Date  . Coronary artery bypass graft  2002    LIMA-LAD, SVG-Diag, SVG-RCA  . Inguinal hernia repair  1983    left  . Nose surgery  1985  . Colonoscopy    . Cataract extraction    . Groin dissection  01/23/2012    Procedure: Virl Son EXPLORATION;  Surgeon: Harl Bowie, MD;  Location: [facility];  Service: General;  Laterality: Left;  Left Inguinal Exploration, release of scar tissue, Placement of Mesh     Family History  Problem Relation Age of Onset  . Breast cancer Mother   . Diabetes Mother   . Hypertension Father   . Dementia Father   . Diabetes Sister   . COPD Brother   . Colon cancer Neg Hx      History   Social History  . Marital Status: Married    Spouse Name: N/A    Number of Children: 0  . Years of Education: N/A   Occupational History  . retired    Social History Main Topics  . Smoking status: Never Smoker   . Smokeless tobacco: Current User    Types: Chew  . Alcohol Use: No  . Drug Use: No  . Sexual Activity: Not on file   Other Topics Concern  . Not on file   Social History Narrative   Remarried in 08-12-2003 after first wife died of emphysema in 11-Aug-2001. Retired  Architect.  Drives school bus.      BP 100/60  Pulse 62  Ht 5\' 10"  (1.778 m)  Wt 185 lb (83.915 kg)  BMI 26.54 kg/m2  SpO2 99%  Physical Exam:  Well appearing 73 yo man, NAD HEENT: Unremarkable Neck:  7 cm JVD, no thyromegally Lymphatics:  No adenopathy Back:  No CVA tenderness Lungs:  Clear with no wheezes HEART:  Regular rate rhythm, no murmurs, no rubs, no clicks Abd:  soft, positive bowel sounds, no organomegally, no rebound, no guarding Ext:  2 plus pulses, no edema, no cyanosis, no clubbing Skin:  No rashes no nodules Neuro:  CN II through XII intact, motor grossly intact   Assess/Plan:

## 2013-12-12 ENCOUNTER — Ambulatory Visit: Payer: Medicare Other | Admitting: Internal Medicine

## 2013-12-13 ENCOUNTER — Ambulatory Visit: Payer: Medicare Other | Admitting: Internal Medicine

## 2014-01-04 ENCOUNTER — Other Ambulatory Visit: Payer: Self-pay | Admitting: Internal Medicine

## 2014-02-01 ENCOUNTER — Other Ambulatory Visit: Payer: Self-pay | Admitting: Internal Medicine

## 2014-02-10 ENCOUNTER — Other Ambulatory Visit (INDEPENDENT_AMBULATORY_CARE_PROVIDER_SITE_OTHER): Payer: Medicare Other

## 2014-02-10 ENCOUNTER — Encounter: Payer: Self-pay | Admitting: Internal Medicine

## 2014-02-10 ENCOUNTER — Ambulatory Visit (INDEPENDENT_AMBULATORY_CARE_PROVIDER_SITE_OTHER): Payer: Medicare Other | Admitting: Internal Medicine

## 2014-02-10 VITALS — BP 150/90 | HR 59 | Temp 98.5°F | Ht 70.5 in | Wt 198.8 lb

## 2014-02-10 DIAGNOSIS — I10 Essential (primary) hypertension: Secondary | ICD-10-CM | POA: Diagnosis not present

## 2014-02-10 DIAGNOSIS — I259 Chronic ischemic heart disease, unspecified: Secondary | ICD-10-CM

## 2014-02-10 DIAGNOSIS — E876 Hypokalemia: Secondary | ICD-10-CM

## 2014-02-10 DIAGNOSIS — R0600 Dyspnea, unspecified: Secondary | ICD-10-CM | POA: Diagnosis not present

## 2014-02-10 LAB — BASIC METABOLIC PANEL
BUN: 20 mg/dL (ref 6–23)
CALCIUM: 9.2 mg/dL (ref 8.4–10.5)
CHLORIDE: 98 meq/L (ref 96–112)
CO2: 35 mEq/L — ABNORMAL HIGH (ref 19–32)
CREATININE: 1.5 mg/dL (ref 0.4–1.5)
GFR: 47.28 mL/min — AB (ref >60.00)
Glucose, Bld: 102 mg/dL — ABNORMAL HIGH (ref 70–99)
Potassium: 3.4 mEq/L — ABNORMAL LOW (ref 3.5–5.1)
Sodium: 141 mEq/L (ref 135–145)

## 2014-02-10 LAB — CBC WITH DIFFERENTIAL/PLATELET
Basophils Absolute: 0 10*3/uL (ref 0.0–0.1)
Basophils Relative: 0.7 % (ref 0.0–3.0)
Eosinophils Absolute: 0.3 10*3/uL (ref 0.0–0.7)
Eosinophils Relative: 3.6 % (ref 0.0–5.0)
HEMATOCRIT: 39.2 % (ref 39.0–52.0)
Hemoglobin: 13.1 g/dL (ref 13.0–17.0)
Lymphocytes Relative: 30.6 % (ref 12.0–46.0)
Lymphs Abs: 2.2 10*3/uL (ref 0.7–4.0)
MCHC: 33.4 g/dL (ref 30.0–36.0)
MCV: 96.9 fl (ref 78.0–100.0)
MONOS PCT: 12.9 % — AB (ref 3.0–12.0)
Monocytes Absolute: 0.9 10*3/uL (ref 0.1–1.0)
NEUTROS PCT: 52.2 % (ref 43.0–77.0)
Neutro Abs: 3.8 10*3/uL (ref 1.4–7.7)
PLATELETS: 132 10*3/uL — AB (ref 150.0–400.0)
RBC: 4.04 Mil/uL — ABNORMAL LOW (ref 4.22–5.81)
RDW: 13.6 % (ref 11.5–15.5)
WBC: 7.4 10*3/uL (ref 4.0–10.5)

## 2014-02-10 MED ORDER — MOMETASONE FUROATE 50 MCG/ACT NA SUSP: 2.0000 | Freq: Every day | NASAL | Status: DC

## 2014-02-10 NOTE — Progress Notes (Signed)
Subjective:    Patient ID: Isaac [name], male    DOB: [birth year]   MRN: [id number]    Brief patient profile:  51 yowm with h/o IHD/HBP/hyperlipidemia followed here for Primary care.   History of Present Illness   06/30/2011 f/u ov/Isaac [name] cc mostly dry cough x one month then new onset LLQ abd pain daily not present sleeping but daily waxes and wanes sev hours at a time no obvious trigger with activity or meals/flatus/ bms rec Prednisone 10 mg take  4 each am x 2 days,   2 each am x 2 days,  1 each am x2days and stop For cough use delsym cough syrup and stop fish oil until no longer coughing at all Try prilosec 20mg   Take 30-60 min before first meal of the day and Pepcid 20 mg one bedtime .  GERD diet  07/14/2011 f/u ov/Isaac [name] cough gone but  cc persistent x one month LLQ pain  Rad across ant abd to naval area plus difficulty with urination x 5 days  But no flank pain, hematuria, dysuria or use of otcs/antihistamine.  Abd pain waxes and wanes worse for maybe an hour then better but always present a little and not worse coughing. No pattern of pain in terms of alleviating or exacerbating.  >>CT abd pelvis neg > referred to [facility]/ Isaac [name]> corrected with L Inguinal sugery 12/2011     05/21/2012 f/u ov/Isaac [name] for f/u hbp cc fell off ladder x one month, better on nsaids still working remodeling, no cp or sob tia or claudication symptoms rec No change rx   August 05 2012 R shoulder surgery outpt  09/06/2012 f/u ov/Isaac [name] comprehensive yearly eval for multiple med problems Chief Complaint  Patient presents with  . Annual Exam    Had fall x approx 5 months ago- pt c/o right shoulder pain.    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 Follow-up    Pt states doing well and denies any co's today.  Urinates q 2 h with decreased fos but bp low on norvasc and cardura 1 mg qhs.  No dysuria, back pain, fever Stop norvasc( amlodipine)  Increase cardura to 4 mg at bedtime  Ok to take one half if light headed standing   03/07/2013 f/u ov/Isaac [name] re: hpb /urinary hesitancy Chief Complaint  Patient presents with  . Follow-up    Breathing is unchanged since last OV.  Flu shot today   Since increased cardura > Having more urinary urgency / ? Excess fluid but stream is def better rec Increased the cardura (doxazosin) to 4 mg twice daily and call if urinary frequency does not improve  Reduce fluid intake and only drink when thirsty   11/08/2013 acut ov/Isaac [name] re: new sob Chief Complaint  Patient presents with  . Follow-up    Pt c/o increased DOE for the past month- gets SOB walking to the mailbox and back.   indolent onset x 6 wks doe,  Sometimes orthopnea, rare though.  Progressed to where  Consistently 50 ft sob Only new problem is cold feet rec Refer to Dr Isaac [name]   02/10/2014 f/u ov/Isaac [name] re:  Hbp/ chronic rhinitis/ chronic  doe with 2 occulded grafts but good LIMA  Chief Complaint  Patient presents with  . Follow-up    DOE; some chest tightness, some productive cough, mostly non-productive, thinks he may be catching cold  has not tried nitrostat for Sob or pain between shoulder blades with exertion but overall tolerating more activity than prior to Cath  Sore throat x 24 h/ no purulent sputum. Nose stuffy with dry mouth, out of nasonex, did not remember afrin rule. Confused with meds/ instructions     No obvious day to day or daytime variabilty or assoc chronic cough or cp  subjective wheeze overt sinus or hb symptoms. No unusual exp hx or h/o childhood pna/ asthma or knowledge of premature birth.  Sleeping ok without nocturnal  or early am exacerbation  of respiratory  c/o's or need for noct saba. Also denies any  obvious fluctuation of symptoms with weather or environmental changes or other aggravating or alleviating factors except as outlined above   Current Medications, Allergies, Complete Past Medical History, Past Surgical History, Family History, and Social History were reviewed in [employer] record.  ROS  The following are not active complaints unless bolded sore throat, dysphagia, dental problems, itching, sneezing,  nasal congestion or excess/ purulent secretions, ear ache,   fever, chills, sweats, unintended wt loss, pleuritic or exertional cp, hemoptysis,  orthopnea pnd or leg swelling, presyncope, palpitations, heartburn, abdominal pain, anorexia, nausea, vomiting, diarrhea  or change in bowel or urinary habits, change in stools or urine, dysuria,hematuria,  rash, arthralgias, visual complaints, headache, numbness weakness or ataxia or problems with walking or coordination,  change in mood/affect or memory.              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.........................................Marland KitchenOlin             Objective:   Physical Exam      Ambulatory healthy appearing in no acute distress with elevated bp p rushed to get here / did not take am imdur  wt   192 April 09, 2009 > 190 July 21, 2009 > 187  03/26/2012  > 05/21/2012 187 > 09/06/2012  189 > 12/06/12 192>  189 03/07/2013  > 11/08/2013 191 >  02/10/2014 199   HEENT: edentulous  nl turbinates, and orophanx. Ext canals full of wax bilaterally  Neck without JVD/Nodes/TM  Lungs clear to A and P bilaterally without cough on insp or exp maneuvers  RRR no s3 or murmur or increase in P2   LLE  pulses slt reduced vs R  Abd soft , no tenderness, BS+ No bruits or organomegaly  EXT no edema,  , nl pulses      CXR  11/08/2013 :  The heart size and mediastinal contours are within normal limits. Status post coronary artery bypass graft. No pneumothorax or pleural effusion is noted. Both lungs are clear. The visualized skeletal structures are unremarkable    Recent Labs Lab 02/10/14 0947  NA 141  K 3.4*  CL 98  CO2 35*  BUN 20  CREATININE 1.5  GLUCOSE 102*    Recent Labs Lab 02/10/14 0947  HGB 13.1  HCT 39.2  WBC 7.4  PLT 132.0*     Lab Results  Component Value Date   TSH 2.32 11/08/2013     Lab Results  Component Value Date   PROBNP 419.0* 02/10/2014        Assessment & Plan:

## 2014-02-10 NOTE — Patient Instructions (Addendum)
Please remember to go to the lab   department downstairs for your tests - we will call you with the results when they are available.  Whenever you are having chest discomfort or back pain or shortness of breath out of usual range then try the nitrostat and let Dr Lovena Le know if need goes up  [facility] to take advil 200 up to 4 with meals as needed for joint pain  See Tammy NP in 6 weeks with all your medications, even over the counter meds, separated in two separate bags, the ones you take no matter what vs the ones you stop once you feel better and take only as needed when you feel you need them.   Tammy  will generate for you a new user friendly medication calendar that will put [place] all on the same page re: your medication use.     Without this process, it simply isn't possible to assure that we are providing  your outpatient care  with  the attention to detail we feel you deserve.   If we cannot assure that you're getting that kind of care,  then we cannot manage your problem effectively from this clinic.  Once you have seen Tammy and we are sure that we're all on the same page with your medication use she will arrange follow up with me.  Add Kdur 20 meq daily

## 2014-02-12 NOTE — Assessment & Plan Note (Signed)
-   new onset early June 2015  - 11/08/2013  Walked RA x 3 laps @ 185 ft each stopped due to  End of study, mild sob, no cp and no desat, EKG ok  - Cardiac w/u inconclusive but not clearly having ischemia  11/24/13   Does seem better than previous ov but note not taking meds correctly  ? Needs cpst if not back to baseline p we first optimize his meds     Each maintenance medication was reviewed in detail including most importantly the difference between maintenance and as needed and under what circumstances the prns are to be used.  Please see instructions for details which were reviewed in writing and the patient given a copy.

## 2014-02-12 NOTE — Assessment & Plan Note (Signed)
Not adequately controlled, confused with how / when to take meds   To keep things simple, I have asked the patient to first separate medicines that are perceived as maintenance, that is to be taken daily "no matter what", from those medicines that are taken on only on an as-needed basis and I have given the patient examples of both, and then return to see our NP to generate a  detailed  medication calendar which should be followed until the next physician sees the patient and updates it.

## 2014-02-13 ENCOUNTER — Other Ambulatory Visit: Payer: Self-pay | Admitting: Internal Medicine

## 2014-02-13 DIAGNOSIS — E876 Hypokalemia: Secondary | ICD-10-CM | POA: Insufficient documentation

## 2014-02-13 LAB — BRAIN NATRIURETIC PEPTIDE: PRO B NATRI PEPTIDE: 419 pg/mL — AB (ref 0.0–100.0)

## 2014-02-13 MED ORDER — POTASSIUM CHLORIDE CRYS ER 20 MEQ PO TBCR: EXTENDED_RELEASE_TABLET | ORAL | Status: DC

## 2014-02-13 NOTE — Progress Notes (Signed)
Quick Note:  Spoke with pt and notified of results per Dr. [name]. Pt verbalized understanding and denied any questions.  ______ 

## 2014-02-13 NOTE — Assessment & Plan Note (Signed)
Mild, secondary to lasix daily >add kdur 20 meq daily

## 2014-02-20 ENCOUNTER — Encounter (HOSPITAL_COMMUNITY): Payer: Self-pay | Admitting: Emergency Medicine

## 2014-02-20 ENCOUNTER — Inpatient Hospital Stay (HOSPITAL_COMMUNITY)
Admission: EM | Admit: 2014-02-20 | Discharge: 2014-02-22 | DRG: 291 | Disposition: A | Discharge status: Home / Self Care | Payer: Medicare Other | Attending: Internal Medicine | Admitting: Internal Medicine

## 2014-02-20 ENCOUNTER — Emergency Department (HOSPITAL_COMMUNITY): Payer: Medicare Other

## 2014-02-20 DIAGNOSIS — Z79899 Other long term (current) drug therapy: Secondary | ICD-10-CM | POA: Diagnosis not present

## 2014-02-20 DIAGNOSIS — Z951 Presence of aortocoronary bypass graft: Secondary | ICD-10-CM

## 2014-02-20 DIAGNOSIS — E785 Hyperlipidemia, unspecified: Secondary | ICD-10-CM | POA: Diagnosis not present

## 2014-02-20 DIAGNOSIS — N400 Enlarged prostate without lower urinary tract symptoms: Secondary | ICD-10-CM | POA: Diagnosis not present

## 2014-02-20 DIAGNOSIS — I25709 Atherosclerosis of coronary artery bypass graft(s), unspecified, with unspecified angina pectoris: Secondary | ICD-10-CM | POA: Diagnosis not present

## 2014-02-20 DIAGNOSIS — I25700 Atherosclerosis of coronary artery bypass graft(s), unspecified, with unstable angina pectoris: Secondary | ICD-10-CM

## 2014-02-20 DIAGNOSIS — Z85828 Personal history of other malignant neoplasm of skin: Secondary | ICD-10-CM | POA: Diagnosis not present

## 2014-02-20 DIAGNOSIS — I359 Nonrheumatic aortic valve disorder, unspecified: Secondary | ICD-10-CM | POA: Diagnosis not present

## 2014-02-20 DIAGNOSIS — I130 Hypertensive heart and chronic kidney disease with heart failure and stage 1 through stage 4 chronic kidney disease, or unspecified chronic kidney disease: Secondary | ICD-10-CM | POA: Diagnosis present

## 2014-02-20 DIAGNOSIS — Z7982 Long term (current) use of aspirin: Secondary | ICD-10-CM

## 2014-02-20 DIAGNOSIS — R0600 Dyspnea, unspecified: Secondary | ICD-10-CM

## 2014-02-20 DIAGNOSIS — I10 Essential (primary) hypertension: Secondary | ICD-10-CM

## 2014-02-20 DIAGNOSIS — R0602 Shortness of breath: Secondary | ICD-10-CM | POA: Diagnosis not present

## 2014-02-20 DIAGNOSIS — K219 Gastro-esophageal reflux disease without esophagitis: Secondary | ICD-10-CM | POA: Diagnosis present

## 2014-02-20 DIAGNOSIS — I2510 Atherosclerotic heart disease of native coronary artery without angina pectoris: Secondary | ICD-10-CM | POA: Diagnosis present

## 2014-02-20 DIAGNOSIS — N183 Chronic kidney disease, stage 3 (moderate): Secondary | ICD-10-CM | POA: Diagnosis present

## 2014-02-20 DIAGNOSIS — I5033 Acute on chronic diastolic (congestive) heart failure: Secondary | ICD-10-CM

## 2014-02-20 DIAGNOSIS — E876 Hypokalemia: Secondary | ICD-10-CM

## 2014-02-20 DIAGNOSIS — R0689 Other abnormalities of breathing: Secondary | ICD-10-CM | POA: Diagnosis not present

## 2014-02-20 DIAGNOSIS — R9431 Abnormal electrocardiogram [ECG] [EKG]: Secondary | ICD-10-CM | POA: Diagnosis present

## 2014-02-20 DIAGNOSIS — I119 Hypertensive heart disease without heart failure: Secondary | ICD-10-CM | POA: Diagnosis present

## 2014-02-20 DIAGNOSIS — I255 Ischemic cardiomyopathy: Secondary | ICD-10-CM | POA: Diagnosis present

## 2014-02-20 DIAGNOSIS — I25810 Atherosclerosis of coronary artery bypass graft(s) without angina pectoris: Secondary | ICD-10-CM

## 2014-02-20 DIAGNOSIS — I200 Unstable angina: Secondary | ICD-10-CM

## 2014-02-20 DIAGNOSIS — K2270 Barrett's esophagus without dysplasia: Secondary | ICD-10-CM

## 2014-02-20 DIAGNOSIS — Z9849 Cataract extraction status, unspecified eye: Secondary | ICD-10-CM

## 2014-02-20 DIAGNOSIS — I509 Heart failure, unspecified: Secondary | ICD-10-CM

## 2014-02-20 DIAGNOSIS — I110 Hypertensive heart disease with heart failure: Secondary | ICD-10-CM

## 2014-02-20 DIAGNOSIS — I25119 Atherosclerotic heart disease of native coronary artery with unspecified angina pectoris: Secondary | ICD-10-CM

## 2014-02-20 HISTORY — DX: Heart failure, unspecified: I50.9

## 2014-02-20 HISTORY — DX: Benign prostatic hyperplasia without lower urinary tract symptoms: N40.0

## 2014-02-20 HISTORY — DX: Abnormal electrocardiogram (ECG) (EKG): R94.31

## 2014-02-20 LAB — CBC WITH DIFFERENTIAL/PLATELET
Basophils Absolute: 0.1 10*3/uL (ref 0.0–0.1)
Basophils Relative: 1 % (ref 0–1)
EOS ABS: 0.2 10*3/uL (ref 0.0–0.7)
EOS PCT: 3 % (ref 0–5)
HCT: 36.4 % — ABNORMAL LOW (ref 39.0–52.0)
Hemoglobin: 12.3 g/dL — ABNORMAL LOW (ref 13.0–17.0)
Lymphocytes Relative: 22 % (ref 12–46)
Lymphs Abs: 1.8 10*3/uL (ref 0.7–4.0)
MCH: 32.5 pg (ref 26.0–34.0)
MCHC: 33.8 g/dL (ref 30.0–36.0)
MCV: 96.3 fL (ref 78.0–100.0)
Monocytes Absolute: 0.6 10*3/uL (ref 0.1–1.0)
Monocytes Relative: 8 % (ref 3–12)
NEUTROS PCT: 66 % (ref 43–77)
Neutro Abs: 5.3 10*3/uL (ref 1.7–7.7)
Platelets: 117 10*3/uL — ABNORMAL LOW (ref 150–400)
RBC: 3.78 MIL/uL — AB (ref 4.22–5.81)
RDW: 13.9 % (ref 11.5–15.5)
WBC: 8 10*3/uL (ref 4.0–10.5)

## 2014-02-20 LAB — BASIC METABOLIC PANEL
ANION GAP: 13 (ref 5–15)
BUN: 23 mg/dL (ref 6–23)
CALCIUM: 8.9 mg/dL (ref 8.4–10.5)
CO2: 30 meq/L (ref 19–32)
Chloride: 102 mEq/L (ref 96–112)
Creatinine, Ser: 1.49 mg/dL — ABNORMAL HIGH (ref 0.50–1.35)
GFR calc Af Amer: 52 mL/min — ABNORMAL LOW (ref >90)
GFR, EST NON AFRICAN AMERICAN: 45 mL/min — AB (ref >90)
Glucose, Bld: 119 mg/dL — ABNORMAL HIGH (ref 70–99)
Potassium: 3.8 mEq/L (ref 3.7–5.3)
SODIUM: 145 meq/L (ref 137–147)

## 2014-02-20 LAB — HEPATIC FUNCTION PANEL
ALK PHOS: 64 U/L (ref 39–117)
ALT: 19 U/L (ref 0–53)
AST: 20 U/L (ref 0–37)
Albumin: 3.7 g/dL (ref 3.5–5.2)
Bilirubin, Direct: <0.2 mg/dL (ref 0.0–0.3)
TOTAL PROTEIN: 7.1 g/dL (ref 6.0–8.3)
Total Bilirubin: 0.5 mg/dL (ref 0.3–1.2)

## 2014-02-20 LAB — TROPONIN I
Troponin I: <0.3 ng/mL (ref <0.30)
Troponin I: <0.3 ng/mL (ref <0.30)
Troponin I: <0.3 ng/mL (ref <0.30)

## 2014-02-20 LAB — PRO B NATRIURETIC PEPTIDE: PRO B NATRI PEPTIDE: 2519 pg/mL — AB (ref 0–125)

## 2014-02-20 MED ORDER — ENOXAPARIN SODIUM 40 MG/0.4ML ~~LOC~~ SOLN
40.0000 mg | SUBCUTANEOUS | Status: DC
  Administered 2014-02-20 – 2014-02-21 (×2): 40 mg via SUBCUTANEOUS
  Filled 2014-02-20 (×3): qty 0.4

## 2014-02-20 MED ORDER — ATENOLOL 50 MG PO TABS
50.0000 mg | ORAL_TABLET | Freq: Every day | ORAL | Status: DC
  Filled 2014-02-20: qty 1

## 2014-02-20 MED ORDER — FUROSEMIDE 10 MG/ML IJ SOLN
40.0000 mg | Freq: Two times a day (BID) | INTRAMUSCULAR | Status: DC
  Administered 2014-02-20 – 2014-02-21 (×3): 40 mg via INTRAVENOUS
  Filled 2014-02-20 (×3): qty 4

## 2014-02-20 MED ORDER — ASPIRIN 81 MG PO CHEW
243.0000 mg | CHEWABLE_TABLET | Freq: Once | ORAL | Status: AC
  Administered 2014-02-20: 243 mg via ORAL
  Filled 2014-02-20: qty 3

## 2014-02-20 MED ORDER — ASPIRIN EC 81 MG PO TBEC
81.0000 mg | DELAYED_RELEASE_TABLET | Freq: Every day | ORAL | Status: DC
  Administered 2014-02-21 – 2014-02-22 (×2): 81 mg via ORAL
  Filled 2014-02-20 (×2): qty 1

## 2014-02-20 MED ORDER — OCUVITE-LUTEIN PO CAPS
1.0000 | ORAL_CAPSULE | Freq: Every day | ORAL | Status: DC
  Filled 2014-02-20: qty 1

## 2014-02-20 MED ORDER — FUROSEMIDE 10 MG/ML IJ SOLN
40.0000 mg | Freq: Once | INTRAMUSCULAR | Status: AC
  Administered 2014-02-20: 40 mg via INTRAVENOUS
  Filled 2014-02-20: qty 4

## 2014-02-20 MED ORDER — VITAMIN D 1000 UNITS PO TABS
2000.0000 [IU] | ORAL_TABLET | Freq: Every day | ORAL | Status: DC
  Administered 2014-02-21 – 2014-02-22 (×2): 2000 [IU] via ORAL
  Filled 2014-02-20 (×2): qty 2

## 2014-02-20 MED ORDER — PANTOPRAZOLE SODIUM 40 MG PO TBEC
40.0000 mg | DELAYED_RELEASE_TABLET | Freq: Every day | ORAL | Status: DC
  Administered 2014-02-21 – 2014-02-22 (×2): 40 mg via ORAL
  Filled 2014-02-20 (×3): qty 1

## 2014-02-20 MED ORDER — SIMVASTATIN 20 MG PO TABS
20.0000 mg | ORAL_TABLET | Freq: Every evening | ORAL | Status: DC
  Administered 2014-02-20 – 2014-02-21 (×2): 20 mg via ORAL
  Filled 2014-02-20 (×3): qty 1

## 2014-02-20 MED ORDER — FLUTICASONE PROPIONATE 50 MCG/ACT NA SUSP
2.0000 | Freq: Every day | NASAL | Status: DC
  Administered 2014-02-20 – 2014-02-21 (×2): 2 via NASAL
  Filled 2014-02-20: qty 16

## 2014-02-20 MED ORDER — CETYLPYRIDINIUM CHLORIDE 0.05 % MT LIQD
7.0000 mL | Freq: Two times a day (BID) | OROMUCOSAL | Status: DC
  Administered 2014-02-20 – 2014-02-22 (×2): 7 mL via OROMUCOSAL

## 2014-02-20 MED ORDER — ISOSORBIDE MONONITRATE ER 30 MG PO TB24
30.0000 mg | ORAL_TABLET | Freq: Every evening | ORAL | Status: DC
  Administered 2014-02-20 – 2014-02-21 (×2): 30 mg via ORAL
  Filled 2014-02-20 (×3): qty 1

## 2014-02-20 MED ORDER — NITROGLYCERIN 0.4 MG SL SUBL: 0.4000 mg | SUBLINGUAL_TABLET | SUBLINGUAL | Status: DC | PRN

## 2014-02-20 MED ORDER — DOXAZOSIN MESYLATE 4 MG PO TABS
4.0000 mg | ORAL_TABLET | Freq: Two times a day (BID) | ORAL | Status: DC
  Administered 2014-02-20 – 2014-02-22 (×4): 4 mg via ORAL
  Filled 2014-02-20 (×5): qty 1

## 2014-02-20 MED ORDER — POTASSIUM CHLORIDE CRYS ER 20 MEQ PO TBCR
20.0000 meq | EXTENDED_RELEASE_TABLET | Freq: Two times a day (BID) | ORAL | Status: DC
  Administered 2014-02-20 – 2014-02-21 (×4): 20 meq via ORAL
  Filled 2014-02-20 (×4): qty 1

## 2014-02-20 MED ORDER — LISINOPRIL 10 MG PO TABS
10.0000 mg | ORAL_TABLET | Freq: Every day | ORAL | Status: DC
  Administered 2014-02-21: 10 mg via ORAL
  Filled 2014-02-20 (×3): qty 1

## 2014-02-20 NOTE — H&P (Addendum)
History and Physical:    Isaac [name] EXN:[id number] DOB: 1940-05-30 DOA: 02/20/2014  Referring physician: Dr. Lacretia Leigh PCP: Christinia Gully, MD  Cardiologist: Dr. Crissie Sickles  Chief Complaint: Dyspnea  History of Present Illness:   Isaac [name] is an 73 y.o. male with a PMH of chronic diastolic CHF (EF 49-67% on Echo done 11/17/13), ischemic heart disease s/p CABG in 2002, HTN who presented to the ER secondary to an episode of PND that began around 12:30 a.m.  He had a similar episode 2 days ago.  The patient reports chronic dyspnea since June, but notes that it has gotten progressively worse.   He had a stress test 11/18/13 which was low risk, EF 58% with mild inferior basal hypokinesis.  Subsequently had a heart catheterization 11/24/13 which showed severe 3 vessel disease: 100% occlusion of the LAD and an occluded 1st diagonal and circumflex.  EF 55%.  Recommendations were for medical therapy.  The patient tells me that he has been symptomatic since June, when his occluded coronaries were evaluated.  He denies diet changes, and does not add salt to his food.  He has been taking all of his medications as prescribed.  He also reports weight gain around the abdomen, and lower extremity swelling.  His symptoms have abated slightly with IV Lasix. No complaints of chest pain, diaphoresis or nausea.  ROS:   Constitutional: No fever, no chills;  Appetite normal; No weight loss, + weight gain, + fatigue.  HEENT: No blurry vision, no diplopia, no pharyngitis, no dysphagia CV: No chest pain, no palpitations, + PND, + orthopnea, no edema.  Resp: + SOB, + dry cough, no pleuritic pain. GI: No nausea, no vomiting, no diarrhea, no melena, no hematochezia, no constipation, no abdominal pain.  GU: No dysuria, no hematuria, no frequency, no urgency. MSK: no myalgias, no arthralgias.  Neuro:  No headache, no focal neurological deficits, no history of seizures.  Psych: No depression, no anxiety.  Endo: No heat  intolerance, no cold intolerance, no polyuria, no polydipsia  Skin: No rashes, no skin lesions.  Heme: No easy bruising.  Travel history: No recent travel.   Past Medical History:   Past Medical History  Diagnosis Date  . Ischemic heart disease   . Hypertension   . Hyperlipidemia   . Allergic rhinitis   . Lumbar disc disease   . GERD (gastroesophageal reflux disease)   . H/O hiatal hernia   . History of skin cancer     bilateral arms with removal  . CAD (coronary artery disease) of artery bypass graft, Occluded VG-non dominant LCX medical therapy 11/25/2013    Past Surgical History:   Past Surgical History  Procedure Laterality Date  . Coronary artery bypass graft  2002    LIMA-LAD, SVG-Diag, SVG-RCA  . Inguinal hernia repair  1983    left  . Nose surgery  1985  . Colonoscopy    . Cataract extraction    . Groin dissection  01/23/2012    Procedure: GROIN EXPLORATION;  Surgeon: Harl Bowie, MD;  Location: [facility];  Service: General;  Laterality: Left;  Left Inguinal Exploration, release of scar tissue, Placement of Mesh    Social History:   History   Social History  . Marital Status: Married    Spouse Name: N/A    Number of Children: 0  . Years of Education: N/A   Occupational History  . Retired    Social History Main Topics  .  Smoking status: Never Smoker   . Smokeless tobacco: Current User    Types: Chew  . Alcohol Use: No  . Drug Use: No  . Sexual Activity: No   Other Topics Concern  . Not on file   Social History Narrative   Remarried in 2003/08/09 after first wife died of emphysema in 2001/08/08. Retired Architect.  Drives school bus.     Family history:   Family History  Problem Relation Age of Onset  . Breast cancer Mother   . Diabetes Mother   . Hypertension Father   . Dementia Father   . Diabetes Sister   . COPD Brother   . Colon cancer Neg Hx     Allergies   Review of patient's allergies indicates no known allergies.  Current  Medications:   Prior to Admission medications   Medication Sig Start Date End Date Taking? Authorizing Provider  aspirin (ADULT ASPIRIN EC LOW STRENGTH) 81 MG EC tablet Take 81 mg by mouth every morning.    Yes Historical Provider, MD  atenolol (TENORMIN) 50 MG tablet Take 50 mg by mouth daily.   Yes Historical Provider, MD  Cholecalciferol (VITAMIN D) 2000 UNITS tablet Take 2,000 Units by mouth daily.    Yes Historical Provider, MD  clotrimazole-betamethasone (LOTRISONE) cream Apply 1 application topically 2 (two) times daily as needed. Apply to rash 11/22/11  Yes Historical Provider, MD  doxazosin (CARDURA) 4 MG tablet Take 4 mg by mouth 2 (two) times daily.   Yes Historical Provider, MD  furosemide (LASIX) 40 MG tablet Take 40 mg by mouth daily.   Yes Historical Provider, MD  ibuprofen (ADVIL,MOTRIN) 200 MG tablet Take 400 mg by mouth 2 (two) times daily as needed for moderate pain.   Yes Historical Provider, MD  isosorbide mononitrate (IMDUR) 30 MG 24 hr tablet Take 30 mg by mouth every evening.    Yes Historical Provider, MD  mometasone (NASONEX) 50 MCG/ACT nasal spray Place 2 sprays into the nose at bedtime.   Yes Historical Provider, MD  Multiple Vitamin (MULTIVITAMIN WITH MINERALS) TABS Take 1 tablet by mouth daily.    Yes Historical Provider, MD  multivitamin-lutein (OCUVITE-LUTEIN) CAPS capsule Take 1 capsule by mouth daily.   Yes Historical Provider, MD  nitroGLYCERIN (NITROSTAT) 0.4 MG SL tablet Place 0.4 mg under the tongue every 5 (five) minutes as needed for chest pain.   Yes Historical Provider, MD  omeprazole (PRILOSEC) 20 MG capsule Take 20 mg by mouth 2 (two) times daily.   Yes Historical Provider, MD  oxymetazoline (AFRIN) 0.05 % nasal spray Place 2 sprays into the nose daily as needed for congestion. Dryness   Yes Historical Provider, MD  potassium chloride SA (K-DUR,KLOR-CON) 20 MEQ tablet Take 20 mEq by mouth daily. Take with Lasix   Yes Historical Provider, MD  simvastatin  (ZOCOR) 20 MG tablet Take 20 mg by mouth every evening.   Yes Historical Provider, MD    Physical Exam:   Filed Vitals:   02/20/14 0725 02/20/14 0730 02/20/14 0800 02/20/14 0909  BP: 138/86 148/72 158/73 122/79  Pulse: 55 55 54 59  Temp:    98 F (36.7 C)  TempSrc:    Oral  Resp: 19 13 16 16   Height:      Weight:      SpO2: 99% 97% 98% 100%     Physical Exam: Blood pressure 122/79, pulse 59, temperature 98 F (36.7 C), temperature source Oral, resp. rate 16, height 5\' 10"  (1.778 m),  weight 89.812 kg (198 lb), SpO2 100.00%. Gen: No acute distress. Head: Normocephalic, atraumatic. Eyes: PERRL, EOMI, sclerae nonicteric. Mouth: Oropharynx clear. Neck: Supple, no thyromegaly, no lymphadenopathy, no jugular venous distention. Chest: Lungs with bibasilar crackles. CV: Heart sounds are regular with a systolic murmur II/VI. Abdomen: Soft, nontender, nondistended with normal active bowel sounds. Extremities: Extremities with trace edema. Skin: Warm and dry. Neuro: Alert and oriented times 3; cranial nerves II through XII grossly intact. Psych: Mood and affect normal.   Data Review:    Labs: Basic Metabolic Panel:  Recent Labs Lab 02/20/14 0637  NA 145  K 3.8  CL 102  CO2 30  GLUCOSE 119*  BUN 23  CREATININE 1.49*  CALCIUM 8.9   Liver Function Tests: No results found for this basename: AST, ALT, ALKPHOS, BILITOT, PROT, ALBUMIN,  in the last 168 hours No results found for this basename: LIPASE, AMYLASE,  in the last 168 hours No results found for this basename: AMMONIA,  in the last 168 hours CBC:  Recent Labs Lab 02/20/14 0637  WBC 8.0  NEUTROABS 5.3  HGB 12.3*  HCT 36.4*  MCV 96.3  PLT 117*   Cardiac Enzymes:  Recent Labs Lab 02/20/14 0637  TROPONINI <0.30    BNP (last 3 results)  Recent Labs  11/08/13 1617 02/10/14 0947 02/20/14 0637  PROBNP 212.0* 419.0* 2519.0*   CBG: No results found for this basename: GLUCAP,  in the last 168  hours  Radiographic Studies: Dg Chest Port 1 View  02/20/2014   CLINICAL DATA:  Initial evaluation for shortness of breath, worst Saturday night and worse again this morning, initial evaluation  EXAM: PORTABLE CHEST - 1 VIEW  COMPARISON:  11/23/2013  FINDINGS: Mild cardiac enlargement. Mild vascular congestion. Bilateral perihilar peribronchial wall thickening. Mild interstitial prominence. No consolidation. Blunting right costophrenic angle similar to prior study may represent chronic pleural thickening. No evidence of left effusion.  IMPRESSION: Findings suggest congestive heart failure with mild interstitial pulmonary edema   Electronically Signed   By: Skipper Cliche M.D.   On: 02/20/2014 07:19    EKG: Independently reviewed. Sinus rhythm at 59 bpm.  T-wave flattening/mild inversion lateral leads.   Assessment/Plan:   Principal Problem:   Acute on chronic diastolic CHF (congestive heart failure), NYHA class 3  Admit to telemetry, cycle cardiac enzymes, diurese with Lasix 40 mg BID.  Repeat 2 D Echo to evaluate for deterioration of LV function.  Continue Beta blocker, nitrates, add ACE-I.  Cardiology consultation requested.  Active Problems:   Hypertensive heart disease  Continue usual anti-hypertensives with the addition of Lisinopril and change Lasix to IV route.    GERD (gastroesophageal reflux disease)  Continue PPI therapy.    Hyperlipidemia  Continue Statin.     BPH (benign prostatic hyperplasia)  Continue Cardura.    T wave inversion in EKG / severe 3 vessel CAD s/p prior CABG  Monitor on telemetry, known severe 3-vessel CAD, cycle troponins.  Continue Aspirin, nitrates.  No complaints of chest pain.    DVT prophylaxis  Lovenox ordered.   Code Status: Full. Family Communication: Wife at bedside. Disposition Plan: Home when stable.  Time spent:1 hour.  Kadie Balestrieri [facility] Pager [phone] Cell: [phone]   If 7PM-7AM, please contact  night-coverage [email or web address] Password [email or web address] 02/20/2014, 9:45 AM

## 2014-02-20 NOTE — Consult Note (Signed)
CARDIOLOGY CONSULT NOTE   Patient ID: Isaac [name] MRN: [id number], DOB/AGE: 06-15-1940   Admit date: 02/20/2014 Date of Consult: 02/20/2014   Primary Physician: Christinia Gully, MD Primary Cardiologist: Dr. Lovena Le  Pt. Profile  73 year-old Caucasian male with past medical history of hypertension, chronic diastolic heart failure, CKD, coronary artery disease status post CABG in 2002, and history of ischemic cardiomyopathy presented with orthopnea and PND The patient says that after d/c he did ok  Has noted that since d/c when lasix has been added he is drinking more fluids.  Thirsty About 2 wks ago he noticed more SOB with walking  Says his diet hasn't changed.   Problem List  Past Medical History  Diagnosis Date  . Ischemic heart disease   . Hypertension   . Hyperlipidemia   . Allergic rhinitis   . Lumbar disc disease   . GERD (gastroesophageal reflux disease)   . H/O hiatal hernia   . History of skin cancer     bilateral arms with removal  . CAD (coronary artery disease) of artery bypass graft, Occluded VG-non dominant LCX medical therapy 11/25/2013    Past Surgical History  Procedure Laterality Date  . Coronary artery bypass graft  2002    LIMA-LAD, SVG-Diag, SVG-RCA  . Inguinal hernia repair  1983    left  . Nose surgery  1985  . Colonoscopy    . Cataract extraction    . Groin dissection  01/23/2012    Procedure: GROIN EXPLORATION;  Surgeon: Harl Bowie, MD;  Location: [facility];  Service: General;  Laterality: Left;  Left Inguinal Exploration, release of scar tissue, Placement of Mesh     Allergies  No Known Allergies  HPI   73 year-old Caucasian male with past medical history of hypertension, chronic diastolic heart failure, CKD, coronary artery disease status post CABG in 2002, and history of ischemic cardiomyopathy. Patient has been experiencing worsening dyspnea on exertion since June of this year. He was admitted for chest pressure in July and underwent  Lexiscan stress test on 11/18/2013 which was read as abnormal, but relatively low risk study with EF 58%, mild inferior basal hypokinesis, perfusion imaging most consistent with scar in the mid to basal inferoseptal wall with mild ischemia in the basal lateral wall. He eventually underwent cardiac catheterization on 11/24/2013 which showed 100% proximal occlusion of LAD, 100% left circumflex occlusion after takeoff of a very large OM 1, chronically occluded SVG to RCA, 90-95% proximal nondominant RCA stenosis, patent SVG to diagonal, patent LIMA to LAD. Although the patient does have severe three-vessel obstructive CAD, his LIMA to LAD and SVG to diagonal were patent. Per Dr. Martinique, it is very unlikely that his symptom was related to the small nondominant RCA. Medical therapy was recommended. He was seen by Dr. Lovena Le on 11/28/2013 at which time he was counseled on fluid limitation and salt restriction.  Per patient, he has been compliant with his medication at home. His takes his Lasix every day. He is also compliant with his low sodium diet, however has been drinking fluid excessively. For the past 2 days, he has been experiencing worsening lower extremity edema, orthopnea and paroxysmal nocturnal dyspnea. He states he slept in his chair last night and still woke up with pulses mild nocturnal dyspnea. He sought medical attention at [facility] ED on 02/20/2014. On arrival significant laboratory finding include creatinine of 1.49, negative troponin, proBNP 2419, hemoglobin 12.3. EKG showed normal sinus rhythm with heart rate  50s, nonspecific T-wave changes. Chest x-ray was concerning for mild pulmonary edema. He was admitted by internal medicine service and was started on 40 mg twice a day dose of IV Lasix. Cardiology was consulted for heart failure.     Inpatient Medications  . [START ON 02/21/2014] aspirin EC  81 mg Oral Daily  . [START ON 02/21/2014] atenolol  50 mg Oral Daily  . [START ON 02/21/2014]  cholecalciferol  2,000 Units Oral Daily  . doxazosin  4 mg Oral BID  . fluticasone  2 spray Each Nare QHS  . furosemide  40 mg Intravenous BID  . isosorbide mononitrate  30 mg Oral QPM  . lisinopril  10 mg Oral Daily  . [START ON 02/21/2014] multivitamin-lutein  1 capsule Oral Daily  . [START ON 02/21/2014] pantoprazole  40 mg Oral Daily  . potassium chloride SA  20 mEq Oral BID  . simvastatin  20 mg Oral QPM    Family History Family History  Problem Relation Age of Onset  . Breast cancer Mother   . Diabetes Mother   . Hypertension Father   . Dementia Father   . Diabetes Sister   . COPD Brother   . Colon cancer Neg Hx      Social History History   Social History  . Marital Status: Married    Spouse Name: N/A    Number of Children: 0  . Years of Education: N/A   Occupational History  . Retired    Social History Main Topics  . Smoking status: Never Smoker   . Smokeless tobacco: Current User    Types: Chew  . Alcohol Use: No  . Drug Use: No  . Sexual Activity: No   Other Topics Concern  . Not on file   Social History Narrative   Remarried in 07-30-2003 after first wife died of emphysema in Jul 29, 2001. Retired Architect.  Drives school bus.      Review of Systems  General:  No chills, fever, night sweats or weight changes.  Cardiovascular:  No chest pain +dyspnea on exertion, edema, orthopnea, palpitations, paroxysmal nocturnal dyspnea. Dermatological: No rash, lesions/masses Respiratory: No cough +dyspnea Urologic: No hematuria, dysuria Abdominal:   No nausea, vomiting, diarrhea, bright red blood per rectum, melena, or hematemesis Neurologic:  No visual changes, wkns, changes in mental status. All other systems reviewed and are otherwise negative except as noted above.  Physical Exam  Blood pressure 137/73, pulse 52, temperature 98.5 F (36.9 C), temperature source Oral, resp. rate 16, height 5\' 10"  (1.778 m), weight 198 lb (89.812 kg), SpO2 98.00%.  General:  Pleasant, NAD Psych: Normal affect. Neuro: Alert and oriented X 3. Moves all extremities spontaneously. HEENT: Normal  Neck: Supple without bruits. Mildly elevated JVD. Lungs:  Resp regular and unlabored, bibasilar rale, R>L Heart: RRR no s3, s4, or murmurs. Abdomen: Soft, non-tender, non-distended, BS + x 4.  Extremities: No clubbing, cyanosis or edema. DP/PT/Radials 2+ and equal bilaterally.  Labs   Recent Labs  02/20/14 0637 02/20/14 1225  TROPONINI <0.30 <0.30   Lab Results  Component Value Date   WBC 8.0 02/20/2014   HGB 12.3* 02/20/2014   HCT 36.4* 02/20/2014   MCV 96.3 02/20/2014   PLT 117* 02/20/2014    Recent Labs Lab 02/20/14 0637  NA 145  K 3.8  CL 102  CO2 30  BUN 23  CREATININE 1.49*  CALCIUM 8.9  PROT 7.1  BILITOT 0.5  ALKPHOS 64  ALT 19  AST 20  GLUCOSE 119*   Lab Results  Component Value Date   CHOL 138 11/24/2013   HDL 56 11/24/2013   LDLCALC 56 11/24/2013   TRIG 131 11/24/2013   Lab Results  Component Value Date   DDIMER 0.67* 11/08/2013    Radiology/Studies  Dg Chest Port 1 View  02/20/2014   CLINICAL DATA:  Initial evaluation for shortness of breath, worst Saturday night and worse again this morning, initial evaluation  EXAM: PORTABLE CHEST - 1 VIEW  COMPARISON:  11/23/2013  FINDINGS: Mild cardiac enlargement. Mild vascular congestion. Bilateral perihilar peribronchial wall thickening. Mild interstitial prominence. No consolidation. Blunting right costophrenic angle similar to prior study may represent chronic pleural thickening. No evidence of left effusion.  IMPRESSION: Findings suggest congestive heart failure with mild interstitial pulmonary edema   Electronically Signed   By: Skipper Cliche M.D.   On: 02/20/2014 07:19    ECG  EKG showed normal sinus rhythm with heart rate 50s, nonspecific T-wave changes.  ASSESSMENT AND PLAN  1. Acute on chronic diastolic HF  - repeat echo pending (limited), however per echo 10/2013 EF 55-60%,  mild LVH, grade 2 diastolic dysfunc, mild to moderate aortic regurg, moderate MR, PA peak pressure 41  - continue IV lasix, monitor renal function  - appears to be related to excessive fluid intake, discussed with patient regarding compliance  2. Coronary artery disease status post CABG in 2002 (LIMA to LAD, SVG to Diag, SVG to RCA (occluded)  - Cath 11/24/2013 100% proximal occlusion of LAD, 100% left circumflex occlusion after takeoff of a very large OM 1, chronically occluded SVG to RCA, 90-95% proximal nondominant RCA stenosis, patent SVG to diagonal, patent LIMA to LAD  3. history of ischemic cardiomyopathy 4. Hypertension 5. CKD   Signed, Almyra Deforest, PA-C 02/20/2014, 2:44 PM  Patient seen and examiined.  Agree with findings as noted above by Janan Ridge.  Patient with acute on chronic diastolic CHF  May be related to dietary fluid indiscretion in setting of CAD and MR. Continue diuresis  Limited echo.    Dorris Carnes

## 2014-02-20 NOTE — ED Provider Notes (Signed)
CSN: [id number]     Arrival date & time 02/20/14  7253 History   First MD Initiated Contact with Patient 02/20/14 [phone]     Chief Complaint  Patient presents with  . Shortness of Breath     (Consider location/radiation/quality/duration/timing/severity/associated sxs/prior Treatment) Patient is a 73 y.o. male presenting with shortness of breath. The history is provided by the patient.  Shortness of Breath He states that he has been unable to catch his breath since 1 AM. Breathing seems to be worse with exertion. There is been a very mild heavy feeling in his chest which he rates at 1/10. There's been no nausea or diaphoresis. On further questioning, he actually has been having orthopnea for the last 2 nights and he had to sleep in a recliner those nights. He has not noticed any leg swelling but his wife noticed his abdomen seems swollen. He has never been this dyspneic before. He does have a history of coronary artery disease and is status post bypass surgery. He has actually been having some difficulty with dyspnea for the last 4 months. Of note, he did take his usual dose of aspirin 81 mg today and states that he is taking all of his other medications except for his furosemide and potassium.  Past Medical History  Diagnosis Date  . Ischemic heart disease   . Hypertension   . Hyperlipidemia   . Allergic rhinitis   . Lumbar disc disease   . GERD (gastroesophageal reflux disease)   . H/O hiatal hernia   . History of skin cancer     bilateral arms with removal  . CAD (coronary artery disease) of artery bypass graft, Occluded VG-non dominant LCX medical therapy 11/25/2013   Past Surgical History  Procedure Laterality Date  . Coronary artery bypass graft  2002    LIMA-LAD, SVG-Diag, SVG-RCA  . Inguinal hernia repair  1983    left  . Nose surgery  1985  . Colonoscopy    . Cataract extraction    . Groin dissection  01/23/2012    Procedure: GROIN EXPLORATION;  Surgeon: Harl Bowie,  MD;  Location: [facility];  Service: General;  Laterality: Left;  Left Inguinal Exploration, release of scar tissue, Placement of Mesh   Family History  Problem Relation Age of Onset  . Breast cancer Mother   . Diabetes Mother   . Hypertension Father   . Dementia Father   . Diabetes Sister   . COPD Brother   . Colon cancer Neg Hx    History  Substance Use Topics  . Smoking status: Never Smoker   . Smokeless tobacco: Current User    Types: Chew  . Alcohol Use: No    Review of Systems  Respiratory: Positive for shortness of breath.   All other systems reviewed and are negative.     Allergies  Review of patient's allergies indicates no known allergies.  Home Medications   Prior to Admission medications   Medication Sig Start Date End Date Taking? Authorizing Provider  aspirin (ADULT ASPIRIN EC LOW STRENGTH) 81 MG EC tablet Take 81 mg by mouth every morning.     Historical Provider, MD  atenolol (TENORMIN) 50 MG tablet Take 1 tablet (50 mg total) by mouth daily. 11/25/13   Brett Canales, PA-C  Cholecalciferol (VITAMIN D) 2000 UNITS tablet Take 2,000 Units by mouth daily.     Historical Provider, MD  clotrimazole-betamethasone (LOTRISONE) cream Apply 1 application topically 2 (two) times daily as needed. Apply  to rash 11/22/11   Historical Provider, MD  doxazosin (CARDURA) 4 MG tablet TAKE 1 TABLET IN THE MORNING AND 1 TABLET AT BEDTIME 02/01/14   Tanda Rockers, MD  furosemide (LASIX) 40 MG tablet Take 1 tablet (40 mg total) by mouth daily. 11/25/13   Brett Canales, PA-C  ibuprofen (ADVIL,MOTRIN) 200 MG tablet Take 400 mg by mouth 2 (two) times daily as needed for moderate pain.    Historical Provider, MD  isosorbide mononitrate (IMDUR) 30 MG 24 hr tablet Take 1 tablet (30 mg total) by mouth daily. 11/25/13   Brett Canales, PA-C  mometasone (NASONEX) 50 MCG/ACT nasal spray Place 2 sprays into the nose at bedtime. 02/10/14   Tanda Rockers, MD  Multiple Vitamin (MULTIVITAMIN WITH  MINERALS) TABS Take 1 tablet by mouth daily.     Historical Provider, MD  nitroGLYCERIN (NITROSTAT) 0.4 MG SL tablet Place 1 tablet (0.4 mg total) under the tongue every 5 (five) minutes x 3 doses as needed for chest pain. 11/25/13   Brett Canales, PA-C  omeprazole (PRILOSEC) 20 MG capsule Take 20 mg by mouth 2 (two) times daily.    Historical Provider, MD  oxymetazoline (AFRIN) 0.05 % nasal spray Place 2 sprays into the nose daily as needed for congestion. Dryness    Historical Provider, MD  potassium chloride SA (K-DUR,KLOR-CON) 20 MEQ tablet 1 tablet daily when you take lasix 02/13/14   Tanda Rockers, MD  simvastatin (ZOCOR) 20 MG tablet Take 20 mg by mouth every evening.    Historical Provider, MD   BP 125/71  Pulse 62  Temp(Src) 97.9 F (36.6 C) (Oral)  Resp 18  Ht 5\' 10"  (1.778 m)  Wt 198 lb (89.812 kg)  BMI 28.41 kg/m2  SpO2 100% Physical Exam  Nursing note and vitals reviewed.  73 year old male, resting comfortably and in no acute distress. Vital signs are normal. Oxygen saturation is 100%, which is normal. Head is normocephalic and atraumatic. PERRLA, EOMI. Oropharynx is clear. Neck is nontender and supple without adenopathy. JVD is present at 90. Back is nontender and there is no CVA tenderness. There is trace presacral edema. Lungs have coarse rales present about halfway up. There are no wheezes or rhonchi. Chest is nontender. Sternotomy scar is present. Heart has regular rate and rhythm without murmur. Abdomen is soft, flat, nontender without masses or hepatosplenomegaly and peristalsis is normoactive. Extremities have 2-3+ edema, full range of motion is present. Skin is warm and dry without rash. Neurologic: Mental status is normal, cranial nerves are intact, there are no motor or sensory deficits.  ED Course  Procedures (including critical care time) Labs Review Results for orders placed during the hospital encounter of 02/20/14  CBC WITH DIFFERENTIAL      Result  Value Ref Range   WBC 8.0  4.0 - 10.5 K/uL   RBC 3.78 (*) 4.22 - 5.81 MIL/uL   Hemoglobin 12.3 (*) 13.0 - 17.0 g/dL   HCT 36.4 (*) 39.0 - 52.0 %   MCV 96.3  78.0 - 100.0 fL   MCH 32.5  26.0 - 34.0 pg   MCHC 33.8  30.0 - 36.0 g/dL   RDW 13.9  11.5 - 15.5 %   Platelets 117 (*) 150 - 400 K/uL   Neutrophils Relative % 66  43 - 77 %   Lymphocytes Relative 22  12 - 46 %   Monocytes Relative 8  3 - 12 %   Eosinophils Relative 3  0 - 5 %   Basophils Relative 1  0 - 1 %   Neutro Abs 5.3  1.7 - 7.7 K/uL   Lymphs Abs 1.8  0.7 - 4.0 K/uL   Monocytes Absolute 0.6  0.1 - 1.0 K/uL   Eosinophils Absolute 0.2  0.0 - 0.7 K/uL   Basophils Absolute 0.1  0.0 - 0.1 K/uL   Smear Review PLATELET COUNT CONFIRMED BY SMEAR    BASIC METABOLIC PANEL      Result Value Ref Range   Sodium 145  137 - 147 mEq/L   Potassium 3.8  3.7 - 5.3 mEq/L   Chloride 102  96 - 112 mEq/L   CO2 30  19 - 32 mEq/L   Glucose, Bld 119 (*) 70 - 99 mg/dL   BUN 23  6 - 23 mg/dL   Creatinine, Ser 1.49 (*) 0.50 - 1.35 mg/dL   Calcium 8.9  8.4 - 10.5 mg/dL   GFR calc non Af Amer 45 (*) >90 mL/min   GFR calc Af Amer 52 (*) >90 mL/min   Anion gap 13  5 - 15  PRO B NATRIURETIC PEPTIDE      Result Value Ref Range   Pro B Natriuretic peptide (BNP) 2519.0 (*) 0 - 125 pg/mL  TROPONIN I      Result Value Ref Range   Troponin I <0.30  <0.30 ng/mL  TROPONIN I      Result Value Ref Range   Troponin I <0.30  <0.30 ng/mL  HEPATIC FUNCTION PANEL      Result Value Ref Range   Total Protein 7.1  6.0 - 8.3 g/dL   Albumin 3.7  3.5 - 5.2 g/dL   AST 20  0 - 37 U/L   ALT 19  0 - 53 U/L   Alkaline Phosphatase 64  39 - 117 U/L   Total Bilirubin 0.5  0.3 - 1.2 mg/dL   Bilirubin, Direct <0.2  0.0 - 0.3 mg/dL   Indirect Bilirubin NOT CALCULATED  0.3 - 0.9 mg/dL    Imaging Review Dg Chest Port 1 View  02/20/2014   CLINICAL DATA:  Initial evaluation for shortness of breath, worst Saturday night and worse again this morning, initial evaluation   EXAM: PORTABLE CHEST - 1 VIEW  COMPARISON:  11/23/2013  FINDINGS: Mild cardiac enlargement. Mild vascular congestion. Bilateral perihilar peribronchial wall thickening. Mild interstitial prominence. No consolidation. Blunting right costophrenic angle similar to prior study may represent chronic pleural thickening. No evidence of left effusion.  IMPRESSION: Findings suggest congestive heart failure with mild interstitial pulmonary edema   Electronically Signed   By: Skipper Cliche M.D.   On: 02/20/2014 07:19   Images viewed by me.   EKG Interpretation   Date/Time:  Monday February 20 2014 06:30:41 EDT Ventricular Rate:  59 PR Interval:  151 QRS Duration: 98 QT Interval:  479 QTC Calculation: 474 R Axis:   29 Text Interpretation:  Sinus rhythm Probable left atrial enlargement  Nonspecific T abnormalities, lateral leads When compared with ECG of  11/23/2013, No significant change was found Confirmed by [facility]  MD, Dung Prien  ([id number]) on 02/20/2014 6:44:26 AM      MDM   Final diagnoses:  Dyspnea  CHF exacerbation    Dyspnea which seems to be due to congestive heart failure. He shows signs of congestive heart failure with peripheral edema, rales, and neck vein distention. ECG is unchanged from baseline. Chest x-ray is ordered and he is given a dose of  parenteral furosemide. Old records are reviewed and recent cardiac catheterization showed a small area of myocardium at risk that was not amenable to bypass surgery or stenting. Chest x-ray appears consistent with CHF exacerbation. Case is signed out to Dr. Zenia Resides.    Delora Fuel, MD 15/37/94 [id number]

## 2014-02-20 NOTE — ED Notes (Signed)
Pt c/o difficulty breathing since June, worse last night @ 1200, pt unable to lie flat, swelling noted to hands and feet. A & O.

## 2014-02-20 NOTE — ED Provider Notes (Signed)
Pt seen by dr Roxanne Mins and signed out to me. Labs/cxr c/w chf, spoke with dr. Rockne Menghini, will be admitted to triad for chf  Isaac Jacobsen, MD 02/20/14 [phone]

## 2014-02-21 DIAGNOSIS — I359 Nonrheumatic aortic valve disorder, unspecified: Secondary | ICD-10-CM

## 2014-02-21 DIAGNOSIS — N400 Enlarged prostate without lower urinary tract symptoms: Secondary | ICD-10-CM

## 2014-02-21 DIAGNOSIS — R0600 Dyspnea, unspecified: Secondary | ICD-10-CM

## 2014-02-21 DIAGNOSIS — I25709 Atherosclerosis of coronary artery bypass graft(s), unspecified, with unspecified angina pectoris: Secondary | ICD-10-CM

## 2014-02-21 LAB — BASIC METABOLIC PANEL
Anion gap: 13 (ref 5–15)
BUN: 24 mg/dL — AB (ref 6–23)
CHLORIDE: 101 meq/L (ref 96–112)
CO2: 27 meq/L (ref 19–32)
CREATININE: 1.55 mg/dL — AB (ref 0.50–1.35)
Calcium: 9.1 mg/dL (ref 8.4–10.5)
GFR calc Af Amer: 50 mL/min — ABNORMAL LOW (ref >90)
GFR calc non Af Amer: 43 mL/min — ABNORMAL LOW (ref >90)
Glucose, Bld: 105 mg/dL — ABNORMAL HIGH (ref 70–99)
Potassium: 4.2 mEq/L (ref 3.7–5.3)
Sodium: 141 mEq/L (ref 137–147)

## 2014-02-21 LAB — TROPONIN I: Troponin I: <0.3 ng/mL (ref <0.30)

## 2014-02-21 MED ORDER — METOPROLOL SUCCINATE ER 25 MG PO TB24
50.0000 mg | ORAL_TABLET | Freq: Every day | ORAL | Status: DC
  Administered 2014-02-21 – 2014-02-22 (×2): 50 mg via ORAL
  Filled 2014-02-21 (×2): qty 2

## 2014-02-21 MED ORDER — POLYVINYL ALCOHOL 1.4 % OP SOLN
1.0000 [drp] | OPHTHALMIC | Status: DC | PRN
  Filled 2014-02-21: qty 15

## 2014-02-21 MED ORDER — PROSIGHT PO TABS
1.0000 | ORAL_TABLET | Freq: Every day | ORAL | Status: DC
  Administered 2014-02-21 – 2014-02-22 (×2): 1 via ORAL
  Filled 2014-02-21 (×2): qty 1

## 2014-02-21 NOTE — Progress Notes (Signed)
    Subjective:  Denies CP; dyspnea improving   Objective:  Filed Vitals:   02/20/14 0909 02/20/14 1348 02/20/14 2016 02/21/14 0509  BP: 122/79 137/73 137/63 125/60  Pulse: 59 52 61 58  Temp: 98 F (36.7 C) 98.5 F (36.9 C) 98.7 F (37.1 C) 98.3 F (36.8 C)  TempSrc: Oral Oral Oral Oral  Resp: 16 16 18 18   Height: 5' 10.5" (1.791 m)     Weight: 187 lb 13.3 oz (85.2 kg)   187 lb 13.3 oz (85.2 kg)  SpO2: 100% 98% 100% 98%    Intake/Output from previous day:  Intake/Output Summary (Last 24 hours) at 02/21/14 0641 Last data filed at 02/21/14 0511  Gross per 24 hour  Intake      0 ml  Output   3025 ml  Net  -3025 ml    Physical Exam: Physical exam: Well-developed well-nourished in no acute distress.  Skin is warm and dry.  HEENT is normal.  Neck is supple.  Chest with minimal basilar crackles Cardiovascular exam is regular rate and rhythm.  Abdominal exam nontender or distended. No masses palpated. Extremities show no edema. neuro grossly intact    Lab Results: Basic Metabolic Panel:  Recent Labs  02/20/14 0637 02/21/14 0347  NA 145 141  K 3.8 4.2  CL 102 101  CO2 30 27  GLUCOSE 119* 105*  BUN 23 24*  CREATININE 1.49* 1.55*  CALCIUM 8.9 9.1   CBC:  Recent Labs  02/20/14 0637  WBC 8.0  NEUTROABS 5.3  HGB 12.3*  HCT 36.4*  MCV 96.3  PLT 117*   Cardiac Enzymes:  Recent Labs  02/20/14 1225 02/20/14 1830 02/21/14  TROPONINI <0.30 <0.30 <0.30     Assessment/Plan:  1 Acute on chronic diastolic congestive heart failure-volume status is improving. Would continue IV diuresis today and follow renal function. We'll most likely transition to by mouth diuretics tomorrow morning and possible discharge. Await followup echocardiogram for LV function and to reassess aortic insufficiency and mitral regurgitation. 2 coronary artery disease-continue aspirin and statin. 3 hypertension-continue present medications and follow blood pressure. Given renal  insufficiency I will change atenolol to Toprol 50 mg daily. 4 stage III renal failure-follow renal function closely with diuresis. 5 hyperlipidemia-continue statin.  Isaac [name] 02/21/2014, 6:41 AM

## 2014-02-21 NOTE — Care Management Note (Addendum)
    Page 1 of 1   02/22/2014     12:59:46 PM CARE MANAGEMENT NOTE 02/22/2014  Patient:  Isaac [name], Isaac [name]   Account Number:  [id number]  Date Initiated:  02/21/2014  Documentation initiated by:  Dessa Phi  Subjective/Objective Assessment:   73 Y/O M ADMITTED W/CHF.HX:CHF.     Action/Plan:   FROM HOME.   Anticipated DC Date:  02/22/2014   Anticipated DC Plan:  [place]  CM consult      Choice offered to / List presented to:             Status of service:  Completed, signed off Medicare Important Message given?   (If response is "NO", the following Medicare IM given date fields will be blank) Date Medicare IM given:   Medicare IM given by:   Date Additional Medicare IM given:   Additional Medicare IM given by:    Discharge Disposition:  HOME/SELF CARE  Per UR Regulation:  Reviewed for med. necessity/level of care/duration of stay  If discussed at [place] of Stay Meetings, dates discussed:    Comments:  02/22/14 Isaac Razzano RN,BSN NCM [phone] D/C HOME NO [place].  02/21/14 Isaac Chapa RN,BSN NCM [phone] MONITOR FOR D/C NEEDS, & PROGRESS.

## 2014-02-21 NOTE — Progress Notes (Signed)
Echocardiogram 2D Echocardiogram limited has been performed.  Isaac [name] 02/21/2014, 11:53 AM

## 2014-02-21 NOTE — Progress Notes (Signed)
[facility] PROGRESS NOTE Interim History: 73 Y/O with pmh significant for chronic diastolic heart failure; ischemic heart failure S/P CABG, HLD and GERD; presented to ED with worsening dyspnea on exertion and increase orthopnea. BNP elevated and CXR with vascular congestion and insterstitial edema. [facility] called to admit patient for further evaluation and Tx.  Filed Weights   02/20/14 7782 02/20/14 0909 02/21/14 0509  Weight: 89.812 kg (198 lb) 85.2 kg (187 lb 13.3 oz) 85.2 kg (187 lb 13.3 oz)        Intake/Output Summary (Last 24 hours) at 02/21/14 2154 Last data filed at 02/21/14 2105  Gross per 24 hour  Intake    240 ml  Output   3175 ml  Net  -2935 ml     Assessment/Plan: 1-Acute on chronic diastolic CHF (congestive heart failure), NYHA class 3: patient symptoms improving. -appreciate cardiology inputs and rec's -continue IV lasix one more day -increase activity -continue low sodium diet, strict intake and output -daily weights -repeat BNP in am -discussion with patient  -troponin neg X3 -close follow up of renal function and electrolytes  2-Hyperlipidemia: continue statins  3-BPH (benign prostatic hyperplasia): no symptoms of urinary retention -continue cardura  4-CAD (coronary artery disease) of artery bypass graft, Occluded VG-non dominant LCX medical therapy: s/p CABG. -neg troponin -no abnormalities on telemetry -continue ASA, statins, b-blocker and IMDUR  5-GERD (gastroesophageal reflux disease):continue PPI  6-chronic renal failure: stage 3 at baseline -has remained at baseline by GFR -slightly elevation on Cr level -follow closely with diuresis     Code Status: Full Family Communication: wife at bedside Disposition Plan: one more day of IV lasix; increase activity and home in am if stable.   Consultants:  Cardiology   Procedures: ECHO: 10/27 - Left ventricle: The cavity size was normal. There was moderate concentric hypertrophy.  Systolic function was normal. The estimated ejection fraction was in the range of 55% to 60%. Wall motion was normal; there were no regional wall motion abnormalities. Doppler parameters are consistent with abnormal left ventricular relaxation (grade 1 diastolic dysfunction). The E/e&' ratio is between 8-15, suggesting indeterminate LV filling pressure. - Aortic valve: Sclerosis without stenosis. There was mild regurgitation. Regurgitation pressure half-time: 510 ms. - Mitral valve: Mildly thickened leaflets . There was mild regurgitation. - Left atrium: Moderately dilated at 45 ml/m2. - Tricuspid valve: There was mild regurgitation. - Pulmonary arteries: PA peak pressure: 35 mm Hg (S). - Inferior vena cava: The vessel was normal in size. The respirophasic diameter changes were in the normal range (>= 50%), consistent with normal central venous pressure.  Impressions:  - Compared to the prior echo in 10/2013, the EF is stable without clear wall motion abnormality. There is now only mild AI and MR. The LA is moderately dilated with mild TR and the RVSP is somewhat reduced.  Antibiotics:  None   HPI/Subjective: Denies CP. Reports breathing is improving and is experiencing less orthopnea.  Objective: Filed Vitals:   02/21/14 0509 02/21/14 0802 02/21/14 1357 02/21/14 2146  BP: 125/60  118/55 103/58  Pulse: 58  57 58  Temp: 98.3 F (36.8 C)  98.6 F (37 C) 98.5 F (36.9 C)  TempSrc: Oral  Oral Oral  Resp: 18 17 18 20   Height:      Weight: 85.2 kg (187 lb 13.3 oz)     SpO2: 98%  95% 96%     Exam:  General: Alert, awake, oriented x3, in no acute distress. Reports breathing and orthopnea  sx's much better  HEENT: No bruits, no goiter. No JVD Heart: Regular rate and rhythm, positive SEM, no rubs  Lungs: no wheezing, scattered rhonchi; fine crackles  Abdomen: Soft, nontender, nondistended, positive bowel sounds.  Neuro: Grossly intact, nonfocal.   Data Reviewed: Basic  Metabolic Panel:  Recent Labs Lab 02/20/14 0637 02/21/14 0347  NA 145 141  K 3.8 4.2  CL 102 101  CO2 30 27  GLUCOSE 119* 105*  BUN 23 24*  CREATININE 1.49* 1.55*  CALCIUM 8.9 9.1   Liver Function Tests:  Recent Labs Lab 02/20/14 0637  AST 20  ALT 19  ALKPHOS 64  BILITOT 0.5  PROT 7.1  ALBUMIN 3.7   CBC:  Recent Labs Lab 02/20/14 0637  WBC 8.0  NEUTROABS 5.3  HGB 12.3*  HCT 36.4*  MCV 96.3  PLT 117*   Cardiac Enzymes:  Recent Labs Lab 02/20/14 0637 02/20/14 1225 02/20/14 1830 02/21/14  TROPONINI <0.30 <0.30 <0.30 <0.30   BNP (last 3 results)  Recent Labs  11/08/13 1617 02/10/14 0947 02/20/14 0637  PROBNP 212.0* 419.0* 2519.0*    Studies: Dg Chest Port 1 View  02/20/2014   CLINICAL DATA:  Initial evaluation for shortness of breath, worst Saturday night and worse again this morning, initial evaluation  EXAM: PORTABLE CHEST - 1 VIEW  COMPARISON:  11/23/2013  FINDINGS: Mild cardiac enlargement. Mild vascular congestion. Bilateral perihilar peribronchial wall thickening. Mild interstitial prominence. No consolidation. Blunting right costophrenic angle similar to prior study may represent chronic pleural thickening. No evidence of left effusion.  IMPRESSION: Findings suggest congestive heart failure with mild interstitial pulmonary edema   Electronically Signed   By: Skipper Cliche M.D.   On: 02/20/2014 07:19    Scheduled Meds: . antiseptic oral rinse  7 mL Mouth Rinse BID  . aspirin EC  81 mg Oral Daily  . cholecalciferol  2,000 Units Oral Daily  . doxazosin  4 mg Oral BID  . enoxaparin (LOVENOX) injection  40 mg Subcutaneous Q24H  . fluticasone  2 spray Each Nare QHS  . furosemide  40 mg Intravenous BID  . isosorbide mononitrate  30 mg Oral QPM  . lisinopril  10 mg Oral Daily  . metoprolol succinate  50 mg Oral Daily  . multivitamin  1 tablet Oral Daily  . pantoprazole  40 mg Oral Daily  . potassium chloride SA  20 mEq Oral BID  . simvastatin   20 mg Oral QPM   Continuous Infusions:   Time: 30 minutes  Barton Dubois  [facility] Pager [phone]. If 8PM-8AM, please contact night-coverage at [email or web address], password [facility] 02/21/2014, 9:54 PM  LOS: 1 day

## 2014-02-21 NOTE — Progress Notes (Signed)
Nutrition Brief Note  Patient identified on the Malnutrition Screening Tool (MST) Report  Wt Readings from Last 15 Encounters:  02/21/14 187 lb 13.3 oz (85.2 kg)  02/10/14 198 lb 12.8 oz (90.175 kg)  11/28/13 185 lb (83.915 kg)  11/23/13 185 lb 4.8 oz (84.052 kg)  11/23/13 185 lb 4.8 oz (84.052 kg)  11/14/13 188 lb (85.276 kg)  11/08/13 190 lb 12.8 oz (86.546 kg)  03/07/13 189 lb 3.2 oz (85.821 kg)  12/06/12 192 lb 6.4 oz (87.272 kg)  09/06/12 189 lb (85.73 kg)  06/01/12 191 lb (86.637 kg)  05/27/12 187 lb (84.823 kg)  05/21/12 187 lb (84.823 kg)  05/11/12 186 lb 6.4 oz (84.55 kg)  03/26/12 187 lb (84.823 kg)    Body mass index is 26.56 kg/(m^2). Patient meets criteria for Overweight based on current BMI.   Current diet order is Heart Healthy, patient is consuming approximately 100% of meals at this time. Labs and medications reviewed.   Pt denied any changes in appetite, weight or changes in diet per H&P. Previous medical records indicates pt's weight stays within 185-190 lbs, likely related to fluid fluctuations from CHF.  No nutrition interventions warranted at this time. If nutrition issues arise, please consult RD.   Atlee Abide MS RD LDN Clinical Dietitian [phone]

## 2014-02-22 LAB — BASIC METABOLIC PANEL
ANION GAP: 15 (ref 5–15)
BUN: 27 mg/dL — AB (ref 6–23)
CO2: 25 meq/L (ref 19–32)
CREATININE: 1.74 mg/dL — AB (ref 0.50–1.35)
Calcium: 9.7 mg/dL (ref 8.4–10.5)
Chloride: 103 mEq/L (ref 96–112)
GFR calc Af Amer: 43 mL/min — ABNORMAL LOW (ref >90)
GFR calc non Af Amer: 37 mL/min — ABNORMAL LOW (ref >90)
Glucose, Bld: 113 mg/dL — ABNORMAL HIGH (ref 70–99)
Potassium: 4.6 mEq/L (ref 3.7–5.3)
Sodium: 143 mEq/L (ref 137–147)

## 2014-02-22 LAB — PRO B NATRIURETIC PEPTIDE: Pro B Natriuretic peptide (BNP): 561.9 pg/mL — ABNORMAL HIGH (ref 0–125)

## 2014-02-22 MED ORDER — POTASSIUM CHLORIDE CRYS ER 20 MEQ PO TBCR
20.0000 meq | EXTENDED_RELEASE_TABLET | Freq: Every day | ORAL | Status: DC
  Administered 2014-02-22: 20 meq via ORAL
  Filled 2014-02-22: qty 1

## 2014-02-22 MED ORDER — FUROSEMIDE 40 MG PO TABS
40.0000 mg | ORAL_TABLET | Freq: Every day | ORAL | Status: DC
  Administered 2014-02-22: 40 mg via ORAL
  Filled 2014-02-22: qty 1

## 2014-02-22 MED ORDER — FUROSEMIDE 40 MG PO TABS: 40.0000 mg | ORAL_TABLET | Freq: Every day | ORAL | Status: DC | PRN

## 2014-02-22 MED ORDER — METOPROLOL SUCCINATE ER 50 MG PO TB24
50.0000 mg | ORAL_TABLET | Freq: Every day | ORAL | Status: DC
Start: 2014-02-22 — End: 2014-04-25

## 2014-02-22 NOTE — Discharge Instructions (Signed)
Angina Pectoris  Angina pectoris, often just called angina, is extreme discomfort in your chest, neck, or arm caused by a lack of blood in the middle and thickest layer of your heart wall (myocardium). It may feel like tightness or heavy pressure. It may feel like a crushing or squeezing pain. Some people say it feels like gas or indigestion. It may go down your shoulders, back, and arms. Some people may have symptoms other than pain. These symptoms include fatigue, shortness of breath, cold sweats, or nausea. There are four different types of angina:  · Stable angina--Stable angina usually occurs in episodes of predictable frequency and duration. It usually is brought on by physical activity, emotional stress, or excitement. These are all times when the myocardium needs more oxygen. Stable angina usually lasts a few minutes and often is relieved by taking a medicine that can be taken under your tongue (sublingually). The medicine is called nitroglycerin. Stable angina is caused by a buildup of plaque inside the arteries, which restricts blood flow to the heart muscle (atherosclerosis).  · Unstable angina--Unstable angina can occur even when your body experiences little or no physical exertion. It can occur during sleep. It can also occur at rest. It can suddenly increase in severity or frequency. It might not be relieved by sublingual nitroglycerin. It can last up to 30 minutes. The most common cause of unstable angina is a blood clot that has developed on the top of plaque buildup inside a coronary artery. It can lead to a heart attack if the blood clot completely blocks the artery.  · Microvascular angina--This type of angina is caused by a disorder of tiny blood vessels called arterioles. Microvascular angina is more common in women. The pain may be more severe and last longer than other types of angina pectoris.  · Prinzmetal or variant angina--This type of angina pectoris usually occurs when your body  experiences little or no physical exertion. It especially occurs in the early morning hours. It is caused by a spasm of your coronary artery.  HOME CARE INSTRUCTIONS   · Only take over-the-counter and prescription medicines as directed by your health care provider.  · Stay active or increase your exercise as directed by your health care provider.  · Limit strenuous activity as directed by your health care provider.  · Limit heavy lifting as directed by your health care provider.  · Maintain a healthy weight.  · Learn about and eat heart-healthy foods.  · Do not use any tobacco products including cigarettes, chewing tobacco or electronic cigarettes.  SEEK IMMEDIATE MEDICAL CARE IF:   You experience the following symptoms:  · Chest, neck, deep shoulder, or arm pain or discomfort that lasts more than a few minutes.  · Chest, neck, deep shoulder, or arm pain or discomfort that goes away and comes back, repeatedly.  · Heavy sweating with discomfort, without a noticeable cause.  · Shortness of breath or difficulty breathing.  · Angina that does not get better after a few minutes of rest or after taking sublingual nitroglycerin.  These can all be symptoms of a heart attack, which is a medical emergency! Get medical help at once. Call your local emergency service (911 in [place] immediately. Do not  drive yourself to the hospital and do not  wait to for your symptoms to go away.  MAKE SURE YOU:  · Understand these instructions.  · Will watch your condition.  · Will get help right away if you are not   doing well or get worse.  Document Released: 04/14/2005 Document Revised: 04/19/2013 Document Reviewed: 08/16/2013  Exit[employer]® Patient Information ©2015 [employer]. This information is not intended to replace advice given to you by your health care provider. Make sure you discuss any questions you have with your health care provider.

## 2014-02-22 NOTE — Discharge Summary (Signed)
Physician Discharge Summary  Isaac [name] HYW:[id number] DOB: 17-Jul-1940 DOA: 02/20/2014  PCP: Christinia Gully, MD  Admit date: 02/20/2014 Discharge date: 02/22/2014  Recommendations for Outpatient Follow-up:  1. Pt will need to follow up with PCP in 2-3 weeks post discharge 2. Please obtain BMP to evaluate electrolytes and kidney function 3. Please also check CBC to evaluate Hg and Hct levels  Discharge Diagnoses:  Principal Problem:   Acute on chronic diastolic CHF (congestive heart failure), NYHA class 3 Active Problems:   Hypertensive heart disease   GERD (gastroesophageal reflux disease)   CAD (coronary artery disease) of artery bypass graft, Occluded VG-non dominant LCX medical therapy   Hyperlipidemia   BPH (benign prostatic hyperplasia)   T wave inversion in EKG  Discharge Condition: Stable  Diet recommendation: Heart healthy diet discussed in details   History of present illness:  73 Y/O with pmh significant for chronic diastolic heart failure; ischemic heart failure S/P CABG, HLD and GERD; presented to ED with worsening dyspnea on exertion and increase orthopnea. BNP elevated and CXR with vascular congestion and insterstitial edema. TRH called to admit patient for further evaluation and Tx.  Hospital Course:  Principal Problem:   Acute on chronic diastolic CHF (congestive heart failure), NYHA class 3 - volume status improved - change lasix to 40 mg daily with additional 40 mg daily as needed for weight gain of 2 lbs in 24 hours - Patient instructed on low sodium diet and fluid restriction. Note LV function remains normal.    Coronary artery disease - continue aspirin and statin.    HTN - per cardio rec's will change to toprol and d/c atenolol - also d/c lisinopril given renal insufficieny    CKD, stage III - eeds BMP check within one week    HLD - continue statin    Procedures/Studies:  CXR 02/20/2014    Findings suggest congestive heart failure with mild  interstitial pulmonary edema  Consultations:  Cardiology  Antibiotics:  None   Discharge Exam: Filed Vitals:   02/22/14 0605  BP: 118/61  Pulse: 56  Temp: 98 F (36.7 C)  Resp: 16   Filed Vitals:   02/21/14 0802 02/21/14 1357 02/21/14 2146 02/22/14 0605  BP:  118/55 103/58 118/61  Pulse:  57 58 56  Temp:  98.6 F (37 C) 98.5 F (36.9 C) 98 F (36.7 C)  TempSrc:  Oral Oral Oral  Resp: 17 18 20 16   Height:      Weight:    83.643 kg (184 lb 6.4 oz)  SpO2:  95% 96% 93%    General: Pt is alert, follows commands appropriately, not in acute distress Cardiovascular: Regular rate and rhythm, no rubs, no gallops Respiratory: Clear to auscultation bilaterally, no wheezing, no crackles, no rhonchi Abdominal: Soft, non tender, non distended, bowel sounds +, no guarding Extremities: no cyanosis, pulses palpable bilaterally DP and PT Neuro: Grossly nonfocal  Discharge Instructions  Discharge Instructions   Diet - low sodium heart healthy    Complete by:  As directed      Increase activity slowly    Complete by:  As directed             Medication List    STOP taking these medications       atenolol 50 MG tablet  Commonly known as:  TENORMIN     ibuprofen 200 MG tablet  Commonly known as:  ADVIL,MOTRIN      TAKE these medications  ADULT ASPIRIN EC LOW STRENGTH 81 MG EC tablet  Generic drug:  aspirin  Take 81 mg by mouth every morning.     clotrimazole-betamethasone cream  Commonly known as:  LOTRISONE  Apply 1 application topically 2 (two) times daily as needed. Apply to rash     doxazosin 4 MG tablet  Commonly known as:  CARDURA  Take 4 mg by mouth 2 (two) times daily.     furosemide 40 MG tablet  Commonly known as:  LASIX  Take 40 mg by mouth daily.     furosemide 40 MG tablet  Commonly known as:  LASIX  Take 1 tablet (40 mg total) by mouth daily as needed for fluid or edema (for weight gain over 2 lbs in 24 hours).     isosorbide mononitrate  30 MG 24 hr tablet  Commonly known as:  IMDUR  Take 30 mg by mouth every evening.     metoprolol succinate 50 MG 24 hr tablet  Commonly known as:  TOPROL-XL  Take 1 tablet (50 mg total) by mouth daily.     mometasone 50 MCG/ACT nasal spray  Commonly known as:  NASONEX  Place 2 sprays into the nose at bedtime.     multivitamin with minerals Tabs tablet  Take 1 tablet by mouth daily.     multivitamin-lutein Caps capsule  Take 1 capsule by mouth daily.     nitroGLYCERIN 0.4 MG SL tablet  Commonly known as:  NITROSTAT  Place 0.4 mg under the tongue every 5 (five) minutes as needed for chest pain.     omeprazole 20 MG capsule  Commonly known as:  PRILOSEC  Take 20 mg by mouth 2 (two) times daily.     oxymetazoline 0.05 % nasal spray  Commonly known as:  AFRIN  Place 2 sprays into the nose daily as needed for congestion. Dryness     potassium chloride SA 20 MEQ tablet  Commonly known as:  K-DUR,KLOR-CON  Take 20 mEq by mouth daily. Take with Lasix     simvastatin 20 MG tablet  Commonly known as:  ZOCOR  Take 20 mg by mouth every evening.     Vitamin D 2000 UNITS tablet  Take 2,000 Units by mouth daily.           Follow-up Information   Follow up with Christinia Gully, MD.   Specialty:  Pulmonary Disease   Contact information:   [address] [place] [address] [phone]       Follow up with Faye Ramsay, MD. (As needed, If symptoms worsen)    Specialty:  Internal Medicine   Contact information:   [address] [place] [address] [phone]        The results of significant diagnostics from this hospitalization (including imaging, microbiology, ancillary and laboratory) are listed below for reference.     Microbiology: No results found for this or any previous visit (from the past 240 hour(s)).   Labs: Basic Metabolic Panel:  Recent Labs Lab 02/20/14 0637 02/21/14 0347 02/22/14 0323  NA 145 141 143  K 3.8 4.2  4.6  CL 102 101 103  CO2 30 27 25   GLUCOSE 119* 105* 113*  BUN 23 24* 27*  CREATININE 1.49* 1.55* 1.74*  CALCIUM 8.9 9.1 9.7   Liver Function Tests:  Recent Labs Lab 02/20/14 0637  AST 20  ALT 19  ALKPHOS 64  BILITOT 0.5  PROT 7.1  ALBUMIN 3.7   CBC:  Recent Labs Lab 02/20/14 0637  WBC 8.0  NEUTROABS 5.3  HGB 12.3*  HCT 36.4*  MCV 96.3  PLT 117*   Cardiac Enzymes:  Recent Labs Lab 02/20/14 0637 02/20/14 1225 02/20/14 1830 02/21/14  TROPONINI <0.30 <0.30 <0.30 <0.30   BNP: BNP (last 3 results)  Recent Labs  02/10/14 0947 02/20/14 0637 02/22/14 0323  PROBNP 419.0* 2519.0* 561.9*    SIGNED: Time coordinating discharge: Over 30 minutes  Faye Ramsay, MD  [facility] 02/22/2014, 9:44 AM Pager [phone]  If 7PM-7AM, please contact night-coverage [email or web address] Password [email or web address]

## 2014-02-22 NOTE — Progress Notes (Signed)
    Subjective:  Denies CP or dyspnea.   Objective:  Filed Vitals:   02/21/14 0802 02/21/14 1357 02/21/14 2146 02/22/14 0605  BP:  118/55 103/58 118/61  Pulse:  57 58 56  Temp:  98.6 F (37 C) 98.5 F (36.9 C) 98 F (36.7 C)  TempSrc:  Oral Oral Oral  Resp: 17 18 20 16   Height:      Weight:    184 lb 6.4 oz (83.643 kg)  SpO2:  95% 96% 93%    Intake/Output from previous day:  Intake/Output Summary (Last 24 hours) at 02/22/14 0758 Last data filed at 02/22/14 0500  Gross per 24 hour  Intake    360 ml  Output   2645 ml  Net  -2285 ml    Physical Exam: Physical exam: Well-developed well-nourished in no acute distress.  Skin is warm and dry.  HEENT is normal.  Neck is supple.  Chest CTA Cardiovascular exam is regular rate and rhythm.  Abdominal exam nontender or distended. No masses palpated. Extremities show no edema. neuro grossly intact    Lab Results: Basic Metabolic Panel:  Recent Labs  02/21/14 0347 02/22/14 0323  NA 141 143  K 4.2 4.6  CL 101 103  CO2 27 25  GLUCOSE 105* 113*  BUN 24* 27*  CREATININE 1.55* 1.74*  CALCIUM 9.1 9.7   CBC:  Recent Labs  02/20/14 0637  WBC 8.0  NEUTROABS 5.3  HGB 12.3*  HCT 36.4*  MCV 96.3  PLT 117*   Cardiac Enzymes:  Recent Labs  02/20/14 1225 02/20/14 1830 02/21/14  TROPONINI <0.30 <0.30 <0.30     Assessment/Plan:  1 Acute on chronic diastolic congestive heart failure-volume status improved. Change lasix to 40 mg daily with additional 40 mg daily as needed for weight gain of 2 lbs in one day. Patient instructed on low sodium diet and fluid restriction. Note LV function remains normal. 2 coronary artery disease-continue aspirin and statin. 3 hypertension-Given renal insufficiency I would continue Toprol 50 mg daily instead of atenolol. Discontinue lisinopril given renal insufficiency. 4 stage III renal failure-Patient will need a follow-up blood draw in 1 week to check potassium and renal function.  These results should be sent to Dr. Lovena Le. 5 hyperlipidemia-continue statin. Patient can be discharged from a cardiac standpoint. He should follow-up with Dr. Lovena Le as scheduled. Please call with questions. Kirk Ruths 02/22/2014, 7:58 AM

## 2014-03-28 ENCOUNTER — Encounter: Payer: Self-pay | Admitting: Adult Health

## 2014-03-28 ENCOUNTER — Ambulatory Visit (INDEPENDENT_AMBULATORY_CARE_PROVIDER_SITE_OTHER): Payer: Medicare Other | Admitting: Adult Health

## 2014-03-28 ENCOUNTER — Other Ambulatory Visit (INDEPENDENT_AMBULATORY_CARE_PROVIDER_SITE_OTHER): Payer: Medicare Other

## 2014-03-28 ENCOUNTER — Ambulatory Visit (INDEPENDENT_AMBULATORY_CARE_PROVIDER_SITE_OTHER): Payer: Medicare Other | Admitting: Internal Medicine

## 2014-03-28 ENCOUNTER — Encounter: Payer: Self-pay | Admitting: Internal Medicine

## 2014-03-28 VITALS — BP 138/78 | HR 71 | Ht 70.5 in | Wt 189.6 lb

## 2014-03-28 VITALS — BP 124/60 | HR 61 | Temp 98.0°F | Ht 70.5 in | Wt 192.0 lb

## 2014-03-28 DIAGNOSIS — I25118 Atherosclerotic heart disease of native coronary artery with other forms of angina pectoris: Secondary | ICD-10-CM | POA: Diagnosis not present

## 2014-03-28 DIAGNOSIS — I5033 Acute on chronic diastolic (congestive) heart failure: Secondary | ICD-10-CM

## 2014-03-28 DIAGNOSIS — I259 Chronic ischemic heart disease, unspecified: Secondary | ICD-10-CM

## 2014-03-28 DIAGNOSIS — I10 Essential (primary) hypertension: Secondary | ICD-10-CM | POA: Diagnosis not present

## 2014-03-28 DIAGNOSIS — I119 Hypertensive heart disease without heart failure: Secondary | ICD-10-CM

## 2014-03-28 DIAGNOSIS — E876 Hypokalemia: Secondary | ICD-10-CM

## 2014-03-28 DIAGNOSIS — I25119 Atherosclerotic heart disease of native coronary artery with unspecified angina pectoris: Secondary | ICD-10-CM | POA: Diagnosis not present

## 2014-03-28 DIAGNOSIS — E785 Hyperlipidemia, unspecified: Secondary | ICD-10-CM | POA: Diagnosis not present

## 2014-03-28 DIAGNOSIS — Z23 Encounter for immunization: Secondary | ICD-10-CM | POA: Diagnosis not present

## 2014-03-28 DIAGNOSIS — R0602 Shortness of breath: Secondary | ICD-10-CM | POA: Diagnosis not present

## 2014-03-28 LAB — BASIC METABOLIC PANEL
BUN: 28 mg/dL — ABNORMAL HIGH (ref 6–23)
CO2: 27 mEq/L (ref 19–32)
Calcium: 9.1 mg/dL (ref 8.4–10.5)
Chloride: 103 mEq/L (ref 96–112)
Creatinine, Ser: 1.6 mg/dL — ABNORMAL HIGH (ref 0.4–1.5)
GFR: 45.22 mL/min — AB (ref >60.00)
GLUCOSE: 91 mg/dL (ref 70–99)
Potassium: 4.1 mEq/L (ref 3.5–5.1)
SODIUM: 138 meq/L (ref 135–145)

## 2014-03-28 LAB — BRAIN NATRIURETIC PEPTIDE: PRO B NATRI PEPTIDE: 132 pg/mL — AB (ref 0.0–100.0)

## 2014-03-28 MED ORDER — FUROSEMIDE 40 MG PO TABS: 40.0000 mg | ORAL_TABLET | Freq: Every day | ORAL | Status: DC | PRN

## 2014-03-28 NOTE — Addendum Note (Signed)
Addended by: Parke Poisson E on: 03/28/2014 09:56 AM   Modules accepted: Orders

## 2014-03-28 NOTE — Addendum Note (Signed)
Addended by: Parke Poisson E on: 03/28/2014 09:44 AM   Modules accepted: Orders

## 2014-03-28 NOTE — Assessment & Plan Note (Signed)
Recent hospitalization due to decompensated diastolic heart failure, now resolved Appears to be compensated on current regimen. Patient is advised on a low salt diet Labs today with bmet and bnp  Follow with cardiology as planned

## 2014-03-28 NOTE — Assessment & Plan Note (Signed)
He has back pain with exertion which is his anginal equivalent. He will continue his current medications. I've encouraged the patient to take a nitroglycerin. He is also encouraged to continue exercising daily. No change in medications at this point.

## 2014-03-28 NOTE — Progress Notes (Signed)
Subjective:    Patient ID: Isaac [name], male    DOB: [birth year]   MRN: [id number]    Brief patient profile:  51 yowm with h/o IHD/HBP/hyperlipidemia followed here for Primary care.   History of Present Illness   06/30/2011 f/u ov/[name] cc mostly dry cough x one month then new onset LLQ abd pain daily not present sleeping but daily waxes and wanes sev hours at a time no obvious trigger with activity or meals/flatus/ bms rec Prednisone 10 mg take  4 each am x 2 days,   2 each am x 2 days,  1 each am x2days and stop For cough use delsym cough syrup and stop fish oil until no longer coughing at all Try prilosec 20mg   Take 30-60 min before first meal of the day and Pepcid 20 mg one bedtime .  GERD diet  07/14/2011 f/u ov/[name] cough gone but  cc persistent x one month LLQ pain  Rad across ant abd to naval area plus difficulty with urination x 5 days  But no flank pain, hematuria, dysuria or use of otcs/antihistamine.  Abd pain waxes and wanes worse for maybe an hour then better but always present a little and not worse coughing. No pattern of pain in terms of alleviating or exacerbating.  >>CT abd pelvis neg > referred to [facility]/ [name]> corrected with L Inguinal sugery 12/2011     05/21/2012 f/u ov/[name] for f/u hbp cc fell off ladder x one month, better on nsaids still working remodeling, no cp or sob tia or claudication symptoms rec No change rx   August 05 2012 R shoulder surgery outpt  09/06/2012 f/u ov/[name] comprehensive yearly eval for multiple med problems Chief Complaint  Patient presents with  . Annual Exam    Had fall x approx 5 months ago- pt c/o right shoulder pain.    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 Follow-up    Pt states doing well and denies any co's today.  Urinates q 2 h with decreased fos but bp low on norvasc and cardura 1 mg qhs.  No dysuria, back pain, fever Stop norvasc( amlodipine)  Increase cardura to 4 mg at bedtime  Ok to take one half if light headed standing   03/07/2013 f/u ov/[name] re: hpb /urinary hesitancy Chief Complaint  Patient presents with  . Follow-up    Breathing is unchanged since last OV.  Flu shot today   Since increased cardura > Having more urinary urgency / ? Excess fluid but stream is def better rec Increased the cardura (doxazosin) to 4 mg twice daily and call if urinary frequency does not improve  Reduce fluid intake and only drink when thirsty   11/08/2013 acut ov/[name] re: new sob Chief Complaint  Patient presents with  . Follow-up    Pt c/o increased DOE for the past month- gets SOB walking to the mailbox and back.   indolent onset x 6 wks doe,  Sometimes orthopnea, rare though.  Progressed to where  Consistently 50 ft sob Only new problem is cold feet rec Refer to Dr Lovena Le   02/10/2014 f/u ov/[name] re:  Hbp/ chronic rhinitis/ chronic  doe with 2 occulded grafts but good LIMA  Chief Complaint  Patient presents with  . Follow-up    DOE; some chest tightness, some productive cough, mostly non-productive, thinks he may be catching cold  has not tried nitrostat for Sob or pain between shoulder blades with exertion but overall tolerating more activity than prior to Cath  Sore throat x 24 h/ no purulent sputum. Nose stuffy with dry mouth, out of nasonex, did not remember afrin rule. Confused with meds/ instructions  >>no changes   03/28/2014 Follow up and Med Review (HTN/AR/CAD/CHF-D)  Patient returns for a two-month follow-up Since last visit. He was admitted to the hospital in October for congestive heart failure decompensation.  His condition improved with diuresis. His ACE inhibitor was discontinued due to renal insufficiency He  needs follow-up labs today Patient says that he has been feeling improved with decreased shortness of breath and leg swelling. He is now on Lasix 40 mg daily with the option of using an extra Lasix daily if needed for weight gain, or leg swelling Patient says that he weighs daily at home. He says his dry weight at home have been averaging 177-180 Patient denies any chest pain, orthopnea, PND, or increased leg swelling. Patient's Pneumovax is up-to-date. However, has never had a Prevnar vaccine    Current Medications, Allergies, Complete Past Medical History, Past Surgical History, Family History, and Social History were reviewed in [facility] record.  ROS  The following are not active complaints unless bolded sore throat, dysphagia, dental problems, itching, sneezing,  nasal congestion or excess/ purulent secretions, ear ache,   fever, chills, sweats, unintended wt loss, pleuritic or exertional cp, hemoptysis,  orthopnea pnd or leg swelling, presyncope, palpitations, heartburn, abdominal pain, anorexia, nausea, vomiting, diarrhea  or change in bowel or urinary habits, change in stools or urine, dysuria,hematuria,  rash,visual complaints, headache, numbness weakness or ataxia or problems with walking or coordination,  change in mood/affect or memory.              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 [name] ARTHRALGIA (ICD-719.40)  HYPERTENSION (ICD-401.9)  HYPERLIPIDEMIA (ICD-272.4)  ALLERGIC RHINITIS (ICD-477.9)  L IH repair 1984 GERD/ Barrett's............................................................................[name] [name] LLQ abd pain onset 05/2011...........................................................[name] [name] -  01/23/12 scar tissue removed > pain resolved  HEALTH MAINTENANCE.................................................................Marland KitchenWert  -Td  05/2004 -Pneumovax 03/2005 , Prevnar 03/28/2014  -CPX 09/06/2012  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 Neck without JVD/Nodes/TM  Lungs clear to A and P bilaterally without cough on insp or exp maneuvers  RRR no s3 or murmur or increase in P2   LLE  pulses slt reduced vs R , no ededma  Abd soft , no tenderness, BS+ No bruits or organomegaly  EXT no edema,  , nl pulses

## 2014-03-28 NOTE — Patient Instructions (Signed)
Your physician wants you to follow-up in: 12 months with Dr. Taylor. You will receive a reminder letter in the mail two months in advance. If you don't receive a letter, please call our office to schedule the follow-up appointment.    

## 2014-03-28 NOTE — Addendum Note (Signed)
Addended by: Devona Konig on: 03/28/2014 09:50 AM   Modules accepted: Orders

## 2014-03-28 NOTE — Assessment & Plan Note (Signed)
His symptoms are now class II. He is keeping his weight under control. He is maintaining a low-sodium diet. He will continue his current medical therapy, and maintain daily weights.

## 2014-03-28 NOTE — Assessment & Plan Note (Signed)
Compensated on current regimen °

## 2014-03-28 NOTE — Assessment & Plan Note (Signed)
Labs today

## 2014-03-28 NOTE — Patient Instructions (Signed)
Follow med calendar closely and bring to each visit .  Prevnar vaccine today .  Labs today  Low salt and cholesterol diet.  Follow up Dr. Melvyn Novas  In 3 months and As needed

## 2014-03-28 NOTE — Progress Notes (Signed)
HPI Mr. Klahn returns today for followup. He is a pleasant 73 yo man with ischemic heart disease, s/p CABG 13 years ago who developed recurrent chest pressure and sob and was hospitalized and underwent catheterization several days ago. In the interim, he has had some confusion about the results. He had two occluded grafts but a patent lima to the lad and SVG to the diagonal. He is anxious about his activity. He has dietary indiscretion. He notes that he has some discomfort in his back with exertion, which gets better with rest. This typically comes about 10 minutes into exercise. No Known Allergies   Current Outpatient Prescriptions  Medication Sig Dispense Refill  . aspirin (ADULT ASPIRIN EC LOW STRENGTH) 81 MG EC tablet Take 81 mg by mouth every morning.     . Cholecalciferol (VITAMIN D) 2000 UNITS tablet Take 2,000 Units by mouth daily.     . clotrimazole-betamethasone (LOTRISONE) cream Apply 1 application topically 2 (two) times daily as needed. Apply to rash    . doxazosin (CARDURA) 4 MG tablet Take 4 mg by mouth 2 (two) times daily.    . furosemide (LASIX) 40 MG tablet Take 40 mg by mouth daily.    . furosemide (LASIX) 40 MG tablet Take 1 tablet (40 mg total) by mouth daily as needed for fluid or edema (for weight gain over 2 lbs in 24 hours). 60 tablet 1  . isosorbide mononitrate (IMDUR) 30 MG 24 hr tablet Take 30 mg by mouth every evening.     . metoprolol succinate (TOPROL-XL) 50 MG 24 hr tablet Take 1 tablet (50 mg total) by mouth daily. 30 tablet 1  . mometasone (NASONEX) 50 MCG/ACT nasal spray Place 2 sprays into the nose at bedtime.    . Multiple Vitamin (MULTIVITAMIN WITH MINERALS) TABS Take 1 tablet by mouth daily.     . multivitamin-lutein (OCUVITE-LUTEIN) CAPS capsule Take 1 capsule by mouth daily.    . nitroGLYCERIN (NITROSTAT) 0.4 MG SL tablet Place 0.4 mg under the tongue every 5 (five) minutes as needed for chest pain (MAX 3 TABLETS).     [name] omeprazole (PRILOSEC) 20  MG capsule Take 20 mg by mouth 2 (two) times daily.    [name] oxymetazoline (AFRIN) 0.05 % nasal spray Place 2 sprays into the nose daily as needed for congestion. Dryness    . potassium chloride SA (K-DUR,KLOR-CON) 20 MEQ tablet Take 20 mEq by mouth daily. Take with Lasix    . simvastatin (ZOCOR) 20 MG tablet Take 20 mg by mouth every evening.     No current facility-administered medications for this visit.     Past Medical History  Diagnosis Date  . Ischemic heart disease   . Hypertension   . Hyperlipidemia   . Allergic rhinitis   . Lumbar disc disease   . GERD (gastroesophageal reflux disease)   . H/O hiatal hernia   . History of skin cancer     bilateral arms with removal  . CAD (coronary artery disease) of artery bypass graft, Occluded VG-non dominant LCX medical therapy 11/25/2013    ROS:   All systems reviewed and negative except as noted in the HPI.   Past Surgical History  Procedure Laterality Date  . Coronary artery bypass graft  2002    LIMA-LAD, SVG-Diag, SVG-RCA  . Inguinal hernia repair  1983    left  . Nose surgery  1985  . Colonoscopy    . Cataract extraction    .  Groin dissection  01/23/2012    Procedure: GROIN EXPLORATION;  Surgeon: Harl Bowie, MD;  Location: [facility];  Service: General;  Laterality: Left;  Left Inguinal Exploration, release of scar tissue, Placement of Mesh     Family History  Problem Relation Age of Onset  . Breast cancer Mother   . Diabetes Mother   . Hypertension Father   . Dementia Father   . Diabetes Sister   . COPD Brother   . Colon cancer Neg Hx      History   Social History  . Marital Status: Married    Spouse Name: N/A    Number of Children: 0  . Years of Education: N/A   Occupational History  . Retired    Social History Main Topics  . Smoking status: Never Smoker   . Smokeless tobacco: Current User    Types: Chew  . Alcohol Use: No  . Drug Use: No  . Sexual Activity: No   Other Topics Concern  . Not  on file   Social History Narrative   Remarried in 2003-08-27 after first wife died of emphysema in 2001-08-26. Retired Architect.  Drives school bus.      BP 138/78 mmHg  Pulse 71  Ht 5' 10.5" (1.791 m)  Wt 189 lb 9.6 oz (86.002 kg)  BMI 26.81 kg/m2  Physical Exam:  Well appearing 73 yo man, NAD HEENT: Unremarkable Neck:  7 cm JVD, no thyromegally Lymphatics:  No adenopathy Back:  No CVA tenderness Lungs:  Clear with no wheezes HEART:  Regular rate rhythm, no murmurs, no rubs, no clicks Abd:  soft, positive bowel sounds, no organomegally, no rebound, no guarding Ext:  2 plus pulses, no edema, no cyanosis, no clubbing Skin:  No rashes no nodules Neuro:  CN II through XII intact, motor grossly intact   Assess/Plan:

## 2014-03-28 NOTE — Assessment & Plan Note (Signed)
His blood pressure is fairly well controlled. He will continue his current medications. We discussed the importance of a low-sodium diet.

## 2014-03-28 NOTE — Assessment & Plan Note (Signed)
I discussed the importance of a low fat diet. He will continue his statin therapy.

## 2014-03-29 NOTE — Progress Notes (Signed)
Quick Note:  lmtcb X1 to relay results/recs ______ 

## 2014-04-04 ENCOUNTER — Other Ambulatory Visit: Payer: Self-pay | Admitting: Internal Medicine

## 2014-04-06 ENCOUNTER — Encounter (HOSPITAL_COMMUNITY): Payer: Self-pay | Admitting: Cardiology

## 2014-04-18 NOTE — Addendum Note (Signed)
Addended by: Parke Poisson E on: 04/18/2014 01:14 PM   Modules accepted: Orders, Medications

## 2014-04-23 ENCOUNTER — Other Ambulatory Visit: Payer: Self-pay | Admitting: Internal Medicine

## 2014-04-25 ENCOUNTER — Other Ambulatory Visit: Payer: Self-pay | Admitting: Internal Medicine

## 2014-04-25 ENCOUNTER — Telehealth: Payer: Self-pay | Admitting: Internal Medicine

## 2014-04-25 DIAGNOSIS — H1013 Acute atopic conjunctivitis, bilateral: Secondary | ICD-10-CM | POA: Diagnosis not present

## 2014-04-25 MED ORDER — METOPROLOL SUCCINATE ER 50 MG PO TB24: 50.0000 mg | ORAL_TABLET | Freq: Every day | ORAL | Status: DC

## 2014-04-25 NOTE — Telephone Encounter (Signed)
Called and spoke with pt and he stated that he needs refill of the metoprolol.  This has been refilled to his pharmacy and nothing further is needed.

## 2014-05-30 ENCOUNTER — Ambulatory Visit (INDEPENDENT_AMBULATORY_CARE_PROVIDER_SITE_OTHER): Payer: Medicare Other | Admitting: Internal Medicine

## 2014-05-30 ENCOUNTER — Other Ambulatory Visit (INDEPENDENT_AMBULATORY_CARE_PROVIDER_SITE_OTHER): Payer: Medicare Other

## 2014-05-30 ENCOUNTER — Encounter: Payer: Self-pay | Admitting: Internal Medicine

## 2014-05-30 VITALS — BP 120/66 | HR 70 | Temp 97.7°F | Ht 70.5 in | Wt 182.0 lb

## 2014-05-30 DIAGNOSIS — M1990 Unspecified osteoarthritis, unspecified site: Secondary | ICD-10-CM

## 2014-05-30 DIAGNOSIS — R0600 Dyspnea, unspecified: Secondary | ICD-10-CM

## 2014-05-30 DIAGNOSIS — I25118 Atherosclerotic heart disease of native coronary artery with other forms of angina pectoris: Secondary | ICD-10-CM | POA: Diagnosis not present

## 2014-05-30 DIAGNOSIS — Z23 Encounter for immunization: Secondary | ICD-10-CM

## 2014-05-30 LAB — CBC WITH DIFFERENTIAL/PLATELET
BASOS ABS: 0 10*3/uL (ref 0.0–0.1)
Basophils Relative: 0.3 % (ref 0.0–3.0)
EOS PCT: 6.2 % — AB (ref 0.0–5.0)
Eosinophils Absolute: 0.5 10*3/uL (ref 0.0–0.7)
HEMATOCRIT: 37.2 % — AB (ref 39.0–52.0)
HEMOGLOBIN: 12.7 g/dL — AB (ref 13.0–17.0)
Lymphocytes Relative: 22 % (ref 12.0–46.0)
Lymphs Abs: 1.8 10*3/uL (ref 0.7–4.0)
MCHC: 34.2 g/dL (ref 30.0–36.0)
MCV: 92.8 fl (ref 78.0–100.0)
MONOS PCT: 10.9 % (ref 3.0–12.0)
Monocytes Absolute: 0.9 10*3/uL (ref 0.1–1.0)
NEUTROS PCT: 60.6 % (ref 43.0–77.0)
Neutro Abs: 4.9 10*3/uL (ref 1.4–7.7)
Platelets: 211 10*3/uL (ref 150.0–400.0)
RBC: 4.01 Mil/uL — ABNORMAL LOW (ref 4.22–5.81)
RDW: 13.5 % (ref 11.5–15.5)
WBC: 8.1 10*3/uL (ref 4.0–10.5)

## 2014-05-30 LAB — TSH: TSH: 2.63 u[IU]/mL (ref 0.35–4.50)

## 2014-05-30 LAB — BASIC METABOLIC PANEL
BUN: 21 mg/dL (ref 6–23)
CO2: 32 mEq/L (ref 19–32)
CREATININE: 1.65 mg/dL — AB (ref 0.40–1.50)
Calcium: 9.5 mg/dL (ref 8.4–10.5)
Chloride: 100 mEq/L (ref 96–112)
GFR: 43.62 mL/min — ABNORMAL LOW (ref >60.00)
GLUCOSE: 102 mg/dL — AB (ref 70–99)
Potassium: 4 mEq/L (ref 3.5–5.1)
Sodium: 138 mEq/L (ref 135–145)

## 2014-05-30 LAB — SEDIMENTATION RATE: Sed Rate: 63 mm/hr — ABNORMAL HIGH (ref 0–22)

## 2014-05-30 MED ORDER — METOPROLOL SUCCINATE ER 50 MG PO TB24: ORAL_TABLET | ORAL | Status: DC

## 2014-05-30 MED ORDER — SULINDAC 200 MG PO TABS: ORAL_TABLET | ORAL | Status: DC

## 2014-05-30 MED ORDER — TETANUS-DIPHTH-ACELL PERTUSSIS 5-2.5-18.5 LF-MCG/0.5 IM SUSP
0.5000 mL | Freq: Once | INTRAMUSCULAR | Status: AC
  Administered 2014-05-30: 0.5 mL via INTRAMUSCULAR

## 2014-05-30 NOTE — Progress Notes (Signed)
Quick Note:  Spoke with pt and notified of results per Dr. [name]. Pt verbalized understanding and denied any questions.  ______ 

## 2014-05-30 NOTE — Patient Instructions (Addendum)
For arthritis pain try sulindac 200 one twice daily  With meals as needed  Increase metaprolol 50 mg to one and a half each am until you see Dr Lovena Le again and let him adjust it further at that point   Please remember to go to the  lab department downstairs for your tests - we will call you with the results when they are available.  Please schedule a follow up visit in 3 months but call sooner if needed

## 2014-05-30 NOTE — Progress Notes (Signed)
Subjective:    Patient ID: Isaac [name], male    DOB: 09/19/1940   MRN: [id number]    Brief patient profile:  89 yowm with h/o IHD/HBP/hyperlipidemia followed here for Primary care.   History of Present Illness   06/30/2011 f/u ov/Georgean Spainhower cc mostly dry cough x one month then new onset LLQ abd pain daily not present sleeping but daily waxes and wanes sev hours at a time no obvious trigger with activity or meals/flatus/ bms rec Prednisone 10 mg take  4 each am x 2 days,   2 each am x 2 days,  1 each am x2days and stop For cough use delsym cough syrup and stop fish oil until no longer coughing at all Try prilosec 20mg   Take 30-60 min before first meal of the day and Pepcid 20 mg one bedtime .  GERD diet  07/14/2011 f/u ov/Nedda Gains cough gone but  cc persistent x one month LLQ pain  Rad across ant abd to naval area plus difficulty with urination x 5 days  But no flank pain, hematuria, dysuria or use of otcs/antihistamine.  Abd pain waxes and wanes worse for maybe an hour then better but always present a little and not worse coughing. No pattern of pain in terms of alleviating or exacerbating.  >>CT abd pelvis neg > referred to [facility]/ [name]> corrected with L Inguinal sugery 12/2011     05/21/2012 f/u ov/Annaston Upham for f/u hbp cc fell off ladder x one month, better on nsaids still working remodeling, no cp or sob tia or claudication symptoms rec No change rx   August 05 2012 R shoulder surgery outpt  09/06/2012 f/u ov/Skylor Schnapp comprehensive yearly eval for multiple med problems Chief Complaint  Patient presents with  . Annual Exam    Had fall x approx 5 months ago- pt c/o right shoulder pain.    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 Follow-up    Pt states doing well and denies any co's today.  Urinates q 2 h with decreased fos but bp low on norvasc and cardura 1 mg qhs.  No dysuria, back pain, fever Stop norvasc( amlodipine)  Increase cardura to 4 mg at bedtime  Ok to take one half if light headed standing   03/07/2013 f/u ov/Pamelyn Bancroft re: hpb /urinary hesitancy Chief Complaint  Patient presents with  . Follow-up    Breathing is unchanged since last OV.  Flu shot today   Since increased cardura > Having more urinary urgency / ? Excess fluid but stream is def better rec Increased the cardura (doxazosin) to 4 mg twice daily and call if urinary frequency does not improve  Reduce fluid intake and only drink when thirsty   11/08/2013 acut ov/Calib Wadhwa re: new sob Chief Complaint  Patient presents with  . Follow-up    Pt c/o increased DOE for the past month- gets SOB walking to the mailbox and back.   indolent onset x 6 wks doe,  Sometimes orthopnea, rare though.  Progressed to where  Consistently 50 ft sob Only new problem is cold feet rec Refer to Dr Lovena Le   02/10/2014 f/u ov/Prescott Truex re:  Hbp/ chronic rhinitis/ chronic  doe with 2 occulded grafts but good LIMA  Chief Complaint  Patient presents with  . Follow-up    DOE; some chest tightness, some productive cough, mostly non-productive, thinks he may be catching cold  has not tried nitrostat for Sob or pain between shoulder blades with exertion but overall tolerating more activity than prior to Cath  Sore throat x 24 h/ no purulent sputum. Nose stuffy with dry mouth, out of nasonex, did not remember afrin rule. Confused with meds/ instructions  >>no changes   03/28/2014 Follow up and Med Review (HTN/AR/CAD/CHF-D)  Patient returns for a two-month follow-up Since last visit. He was admitted to the hospital in October for congestive heart failure decompensation.  His condition improved with diuresis. His ACE inhibitor was discontinued due to renal insufficiency He  needs follow-up labs today Patient says that he has been feeling improved with decreased shortness of breath and leg swelling. He is now on Lasix 40 mg daily with the option of using an extra Lasix daily if needed for weight gain, or leg swelling Patient says that he weighs daily at home. He says his dry weight at home have been averaging 177-180 Patient denies any chest pain, orthopnea, PND, or increased leg swelling. Patient's Pneumovax is up-to-date. However, has never had a Prevnar vaccine rec Follow med calendar closely and bring to each visit .  Prevnar vaccine today .  Labs today  Low salt and cholesterol diet.    05/30/2014 f/u ov/Mccartney Brucks re: multiple complaints Chief Complaint  Patient presents with  . Follow-up    pt c/o sob, cough wheezing and has white to lt. green mucus x 1 week. Pt denies chest tightness. Pt is having increased back pain and was told not to take aleve.   chronic / recurrent pain between shoulder blades happens every time with ex and better at rest.  Was better while on aleve as was lower back pain   Feels like he's getting over a chest cold now - was worse last week   No obvious day to day or daytime variabilty or assoc sob/   cp or chest tightness,   overt sinus or hb symptoms. No unusual exp hx or h/o childhood pna/ asthma or knowledge of premature birth.  Sleeping ok without nocturnal  or early am exacerbation  of respiratory  c/o's or need for noct saba. Also denies any obvious fluctuation of symptoms with weather or environmental changes or other aggravating or alleviating factors except as outlined above   Current Medications, Allergies, Complete Past Medical History, Past Surgical History, Family History, and Social History were reviewed in [employer] [facility] record.  ROS  The following are not active complaints unless bolded sore throat, dysphagia, dental problems, itching, sneezing,  nasal congestion or excess/ purulent secretions, ear  ache,   fever, chills, sweats, unintended wt loss, pleuritic or exertional cp, hemoptysis,  orthopnea pnd or leg swelling, presyncope, palpitations, heartburn, abdominal pain, anorexia, nausea, vomiting, diarrhea  or change in bowel or urinary habits, change in stools or urine, dysuria,hematuria,  rash, arthralgias, visual complaints, headache, numbness weakness or ataxia or problems with walking or coordination,  change in mood/affect or memory.                     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 Neck without JVD/Nodes/TM  Lungs clear to A and P bilaterally without cough on insp or exp maneuvers  RRR no s3 or murmur or increase in P2   LLE  pulses slt reduced vs R , no ededma  Abd soft , no tenderness, BS+ No bruits or organomegaly  EXT no edema,  , nl  pulses     Labs ordered today /  reviewed include:   Recent Labs Lab 05/30/14 1012  NA 138  K 4.0  CL 100  CO2 32  BUN 21  CREATININE 1.65*  GLUCOSE 102*    Recent Labs Lab 05/30/14 1012  HGB 12.7*  HCT 37.2*  WBC 8.1  PLT 211.0     Lab Results  Component Value Date   TSH 2.63 05/30/2014     Lab Results  Component Value Date   PROBNP 132.0* 03/28/2014     Lab Results  Component Value Date   ESRSEDRATE 63* 05/30/2014

## 2014-06-04 ENCOUNTER — Encounter: Payer: Self-pay | Admitting: Internal Medicine

## 2014-06-04 DIAGNOSIS — M1990 Unspecified osteoarthritis, unspecified site: Secondary | ICD-10-CM | POA: Insufficient documentation

## 2014-06-04 HISTORY — DX: Unspecified osteoarthritis, unspecified site: M19.90

## 2014-06-04 NOTE — Assessment & Plan Note (Signed)
Catheterization 2002 showing occluded LAD, 99% diagonal, occluded distal circumflex, 90% nondominant right coronary artery  CABG 08/06/00 by Dr. Nils Pyle with LIMA to LAD, SVG to diagonal, SVG to right coronary artery, circumflex was noted to be too small to graft  Dr Lovena Le feels his back pain is an anginal equivalent so rec increase lopressor to 75 bid until f/u with Dr Lovena Le to see what if any effect  higher doses have on his back pain

## 2014-06-04 NOTE — Assessment & Plan Note (Signed)
-   new onset early June 2015  - 11/08/2013  Walked RA x 3 laps @ 185 ft each stopped due to  End of study, mild sob, no cp and no desat, EKG ok  - Cardiac w/u inconclusive but not clearly having ischemia  11/24/13   No evidence of chf/ asthma at present, should tolerate higher doses of lopressor though risk provoking AB > if happens Strongly prefer in this setting: Bystolic, the most beta -1  selective Beta blocker available in sample form, with bisoprolol the most selective generic choice  on the market.

## 2014-06-04 NOTE — Assessment & Plan Note (Signed)
Trial of sulindac 05/30/14 since cri risk with other nsaids and some of his back pains may well be djd and not IHD related but hard to tell ,esp with the pain between the shoulder blades with ex for which I agree with Dr Lovena Le more likely this is anginal equivalent

## 2014-06-21 ENCOUNTER — Telehealth: Payer: Self-pay | Admitting: Internal Medicine

## 2014-06-21 MED ORDER — METOPROLOL SUCCINATE ER 50 MG PO TB24: ORAL_TABLET | ORAL | Status: DC

## 2014-06-21 NOTE — Telephone Encounter (Signed)
Spoke with pt, states that his Metoprolol was increased to 1.5 tabs daily at last ov with MW.  Because of it he has ran out.  Refilled med to [facility].  Nothing further needed.

## 2014-06-29 ENCOUNTER — Telehealth: Payer: Self-pay | Admitting: Internal Medicine

## 2014-06-29 MED ORDER — ISOSORBIDE MONONITRATE ER 30 MG PO TB24: 30.0000 mg | ORAL_TABLET | Freq: Every evening | ORAL | Status: DC

## 2014-06-29 NOTE — Telephone Encounter (Signed)
Rx has been sent in. Pt is aware. Nothing further was needed. 

## 2014-07-24 ENCOUNTER — Telehealth: Payer: Self-pay | Admitting: Adult Health

## 2014-07-24 MED ORDER — FUROSEMIDE 40 MG PO TABS: ORAL_TABLET | ORAL | Status: DC

## 2014-07-24 NOTE — Telephone Encounter (Signed)
I reviewed med cal and confirmed he does take an extra lasix prn  Rx for # 60 was sent to pharm  Nothing further needed

## 2014-08-04 ENCOUNTER — Other Ambulatory Visit: Payer: Self-pay | Admitting: Internal Medicine

## 2014-08-30 ENCOUNTER — Encounter: Payer: Self-pay | Admitting: Gastroenterology

## 2014-09-04 ENCOUNTER — Encounter: Payer: Self-pay | Admitting: Gastroenterology

## 2014-09-19 ENCOUNTER — Other Ambulatory Visit: Payer: Self-pay | Admitting: Internal Medicine

## 2014-10-06 ENCOUNTER — Other Ambulatory Visit: Payer: Self-pay | Admitting: Internal Medicine

## 2014-10-18 ENCOUNTER — Telehealth: Payer: Self-pay | Admitting: Internal Medicine

## 2014-10-18 NOTE — Telephone Encounter (Signed)
It looks like Kelli had called pt to schedule rov with MW per recall. Called pt, states he will need to call us back to schedule appt.  Will close message.

## 2014-10-20 ENCOUNTER — Other Ambulatory Visit: Payer: Self-pay | Admitting: Internal Medicine

## 2014-10-22 IMAGING — CR DG CHEST 2V
2 series · 2 of 2 positions shown · non-contrast
Comparison: 06/30/2011 and earlier.

CLINICAL DATA: 71-year-old male undergoing physical exam.
Hypertension.  Ischemic heart disease.

CHEST - 2 VIEW

[view not recorded (1 of 2)]
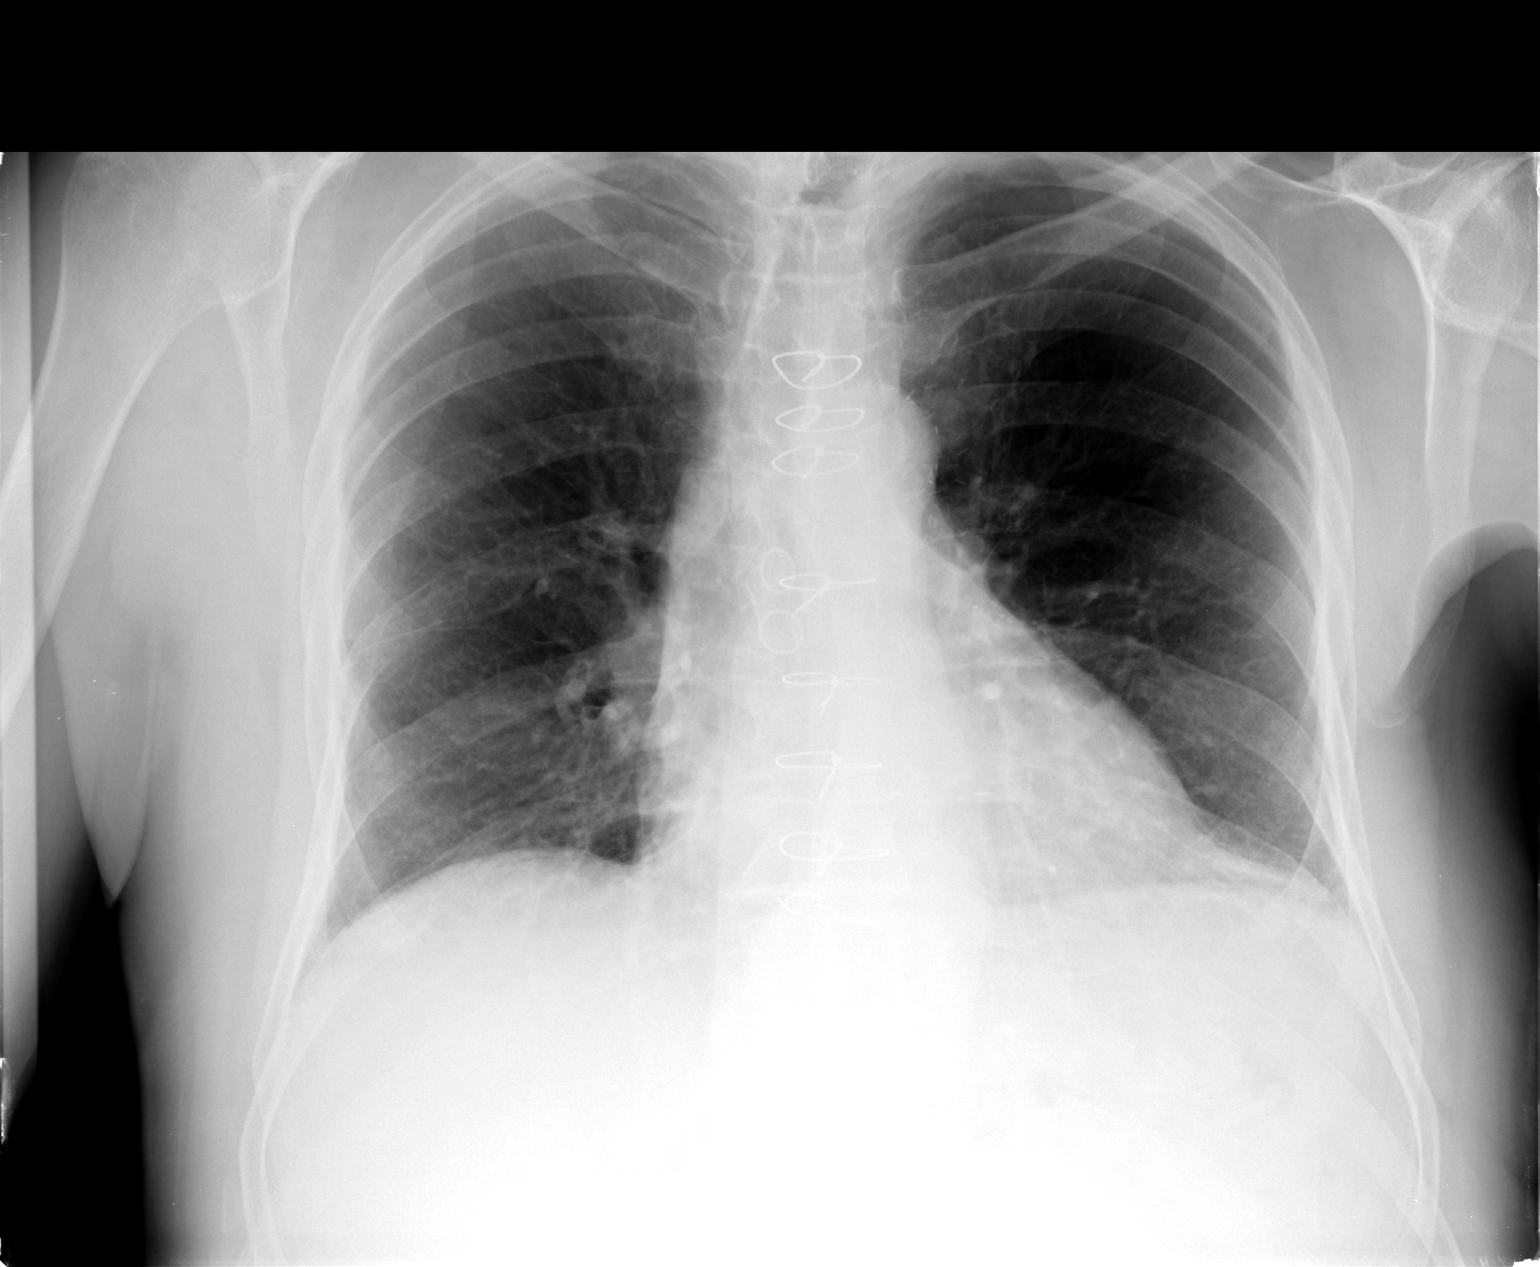

[view not recorded (2 of 2)]
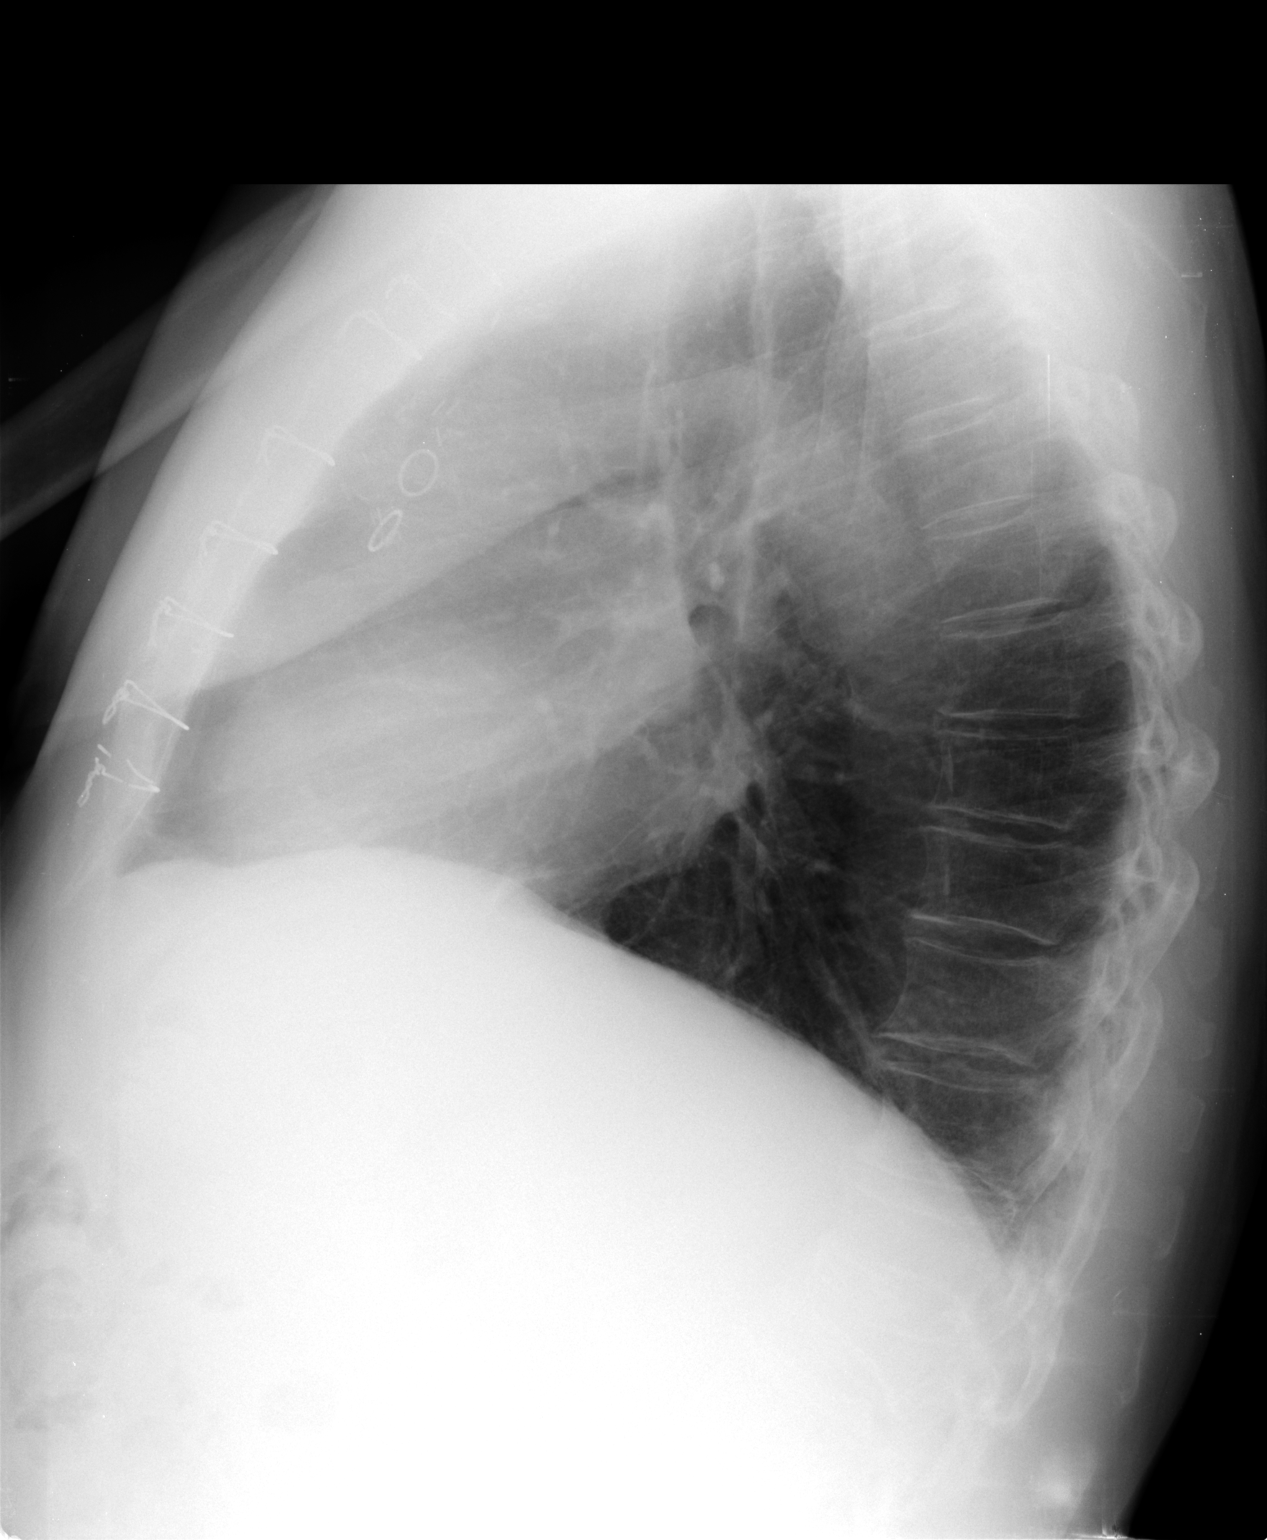

[2 of 2 positions shown; findings below may reference images not displayed]

FINDINGS: Stable cardiac size and mediastinal contours.  Stable
sequelae of CABG.  Stable mild apical pulmonary scarring.  No
pneumothorax, pulmonary edema, pleural effusion or acute pulmonary
opacity.  Multilevel chronic lower thoracic compression fractures
appear not significantly changed.  Osteopenia.
IMPRESSION: No acute cardiopulmonary abnormality.

## 2014-10-23 ENCOUNTER — Telehealth: Payer: Self-pay | Admitting: Internal Medicine

## 2014-10-23 NOTE — Telephone Encounter (Signed)
lmtcb X1 for pt  

## 2014-10-24 ENCOUNTER — Other Ambulatory Visit: Payer: Self-pay | Admitting: Internal Medicine

## 2014-10-24 DIAGNOSIS — H2513 Age-related nuclear cataract, bilateral: Secondary | ICD-10-CM | POA: Diagnosis not present

## 2014-10-24 DIAGNOSIS — H40033 Anatomical narrow angle, bilateral: Secondary | ICD-10-CM | POA: Diagnosis not present

## 2014-10-24 MED ORDER — POTASSIUM CHLORIDE CRYS ER 20 MEQ PO TBCR: EXTENDED_RELEASE_TABLET | ORAL | Status: DC

## 2014-10-24 MED ORDER — ISOSORBIDE MONONITRATE ER 30 MG PO TB24: ORAL_TABLET | ORAL | Status: DC

## 2014-10-24 NOTE — Telephone Encounter (Signed)
lmomtcb x2 for pt 

## 2014-10-24 NOTE — Telephone Encounter (Signed)
Pt aware RX has been sent in for potassium and isosorbide. Nothing further needed

## 2014-10-24 NOTE — Telephone Encounter (Signed)
[phone], pt cb

## 2014-10-26 ENCOUNTER — Ambulatory Visit (INDEPENDENT_AMBULATORY_CARE_PROVIDER_SITE_OTHER): Payer: Medicare Other | Admitting: Internal Medicine

## 2014-10-26 ENCOUNTER — Encounter: Payer: Self-pay | Admitting: Internal Medicine

## 2014-10-26 VITALS — BP 106/60 | HR 69 | Ht 70.5 in | Wt 170.0 lb

## 2014-10-26 DIAGNOSIS — R059 Cough, unspecified: Secondary | ICD-10-CM

## 2014-10-26 DIAGNOSIS — I25119 Atherosclerotic heart disease of native coronary artery with unspecified angina pectoris: Secondary | ICD-10-CM

## 2014-10-26 DIAGNOSIS — M1990 Unspecified osteoarthritis, unspecified site: Secondary | ICD-10-CM

## 2014-10-26 DIAGNOSIS — I10 Essential (primary) hypertension: Secondary | ICD-10-CM | POA: Diagnosis not present

## 2014-10-26 DIAGNOSIS — R05 Cough: Secondary | ICD-10-CM

## 2014-10-26 DIAGNOSIS — I25118 Atherosclerotic heart disease of native coronary artery with other forms of angina pectoris: Secondary | ICD-10-CM | POA: Diagnosis not present

## 2014-10-26 NOTE — Progress Notes (Signed)
Subjective:    Patient ID: Isaac [name], male    DOB: [birth year]   MRN: [id number]    Brief patient profile:  51 yowm with h/o IHD/HBP/hyperlipidemia followed here for Primary care.   History of Present Illness   06/30/2011 f/u ov/Isaac [name] cc mostly dry cough x one month then new onset LLQ abd pain daily not present sleeping but daily waxes and wanes sev hours at a time no obvious trigger with activity or meals/flatus/ bms rec Prednisone 10 mg take  4 each am x 2 days,   2 each am x 2 days,  1 each am x2days and stop For cough use delsym cough syrup and stop fish oil until no longer coughing at all Try prilosec 20mg   Take 30-60 min before first meal of the day and Pepcid 20 mg one bedtime .  GERD diet  07/14/2011 f/u ov/Isaac [name] cough gone but  cc persistent x one month LLQ pain  Rad across ant abd to naval area plus difficulty with urination x 5 days  But no flank pain, hematuria, dysuria or use of otcs/antihistamine.  Abd pain waxes and wanes worse for maybe an hour then better but always present a little and not worse coughing. No pattern of pain in terms of alleviating or exacerbating.  >>CT abd pelvis neg > referred to [facility]/ [name]> corrected with L Inguinal sugery 12/2011     05/21/2012 f/u ov/Isaac [name] for f/u hbp cc fell off ladder x one month, better on nsaids still working remodeling, no cp or sob tia or claudication symptoms rec No change rx   August 05 2012 R shoulder surgery outpt  09/06/2012 f/u ov/Isaac [name] comprehensive yearly eval for multiple med problems Chief Complaint  Patient presents with  . Annual Exam    Had fall x approx 5 months ago- pt c/o right shoulder pain.    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 Follow-up    Pt states doing well and denies any co's today.  Urinates q 2 h with decreased fos but bp low on norvasc and cardura 1 mg qhs.  No dysuria, back pain, fever Stop norvasc( amlodipine)  Increase cardura to 4 mg at bedtime  Ok to take one half if light headed standing   03/07/2013 f/u ov/Isaac [name] re: hpb /urinary hesitancy Chief Complaint  Patient presents with  . Follow-up    Breathing is unchanged since last OV.  Flu shot today   Since increased cardura > Having more urinary urgency / ? Excess fluid but stream is def better rec Increased the cardura (doxazosin) to 4 mg twice daily and call if urinary frequency does not improve  Reduce fluid intake and only drink when thirsty   11/08/2013 acut ov/Isaac [name] re: new sob Chief Complaint  Patient presents with  . Follow-up    Pt c/o increased DOE for the past month- gets SOB walking to the mailbox and back.   indolent onset x 6 wks doe,  Sometimes orthopnea, rare though.  Progressed to where  Consistently 50 ft sob Only new problem is cold feet rec Refer to Dr Lovena Le   02/10/2014 f/u ov/Isaac [name] re:  Hbp/ chronic rhinitis/ chronic  doe with 2 occulded grafts but good LIMA  Chief Complaint  Patient presents with  . Follow-up    DOE; some chest tightness, some productive cough, mostly non-productive, thinks he may be catching cold  has not tried nitrostat for Sob or pain between shoulder blades with exertion but overall tolerating more activity than prior to Cath  Sore throat x 24 h/ no purulent sputum. Nose stuffy with dry mouth, out of nasonex, did not remember afrin rule. Confused with meds/ instructions  >>no changes   03/28/2014 Follow up and Med Review (HTN/AR/CAD/CHF-D)  Patient returns for a two-month follow-up Since last visit. He was admitted to the hospital in October for congestive heart failure decompensation.  His condition improved with diuresis. His ACE inhibitor was discontinued due to renal insufficiency He  needs follow-up labs today Patient says that he has been feeling improved with decreased shortness of breath and leg swelling. He is now on Lasix 40 mg daily with the option of using an extra Lasix daily if needed for weight gain, or leg swelling Patient says that he weighs daily at home. He says his dry weight at home have been averaging 177-180 Patient denies any chest pain, orthopnea, PND, or increased leg swelling. Patient's Pneumovax is up-to-date. However, has never had a Prevnar vaccine rec Follow med calendar closely and bring to each visit .  Prevnar vaccine today .  Labs today  Low salt and cholesterol diet.    05/30/2014 f/u ov/Isaac [name] re: multiple complaints Chief Complaint  Patient presents with  . Follow-up    pt c/o sob, cough wheezing and has white to lt. green mucus x 1 week. Pt denies chest tightness. Pt is having increased back pain and was told not to take aleve.   chronic / recurrent pain between shoulder blades happens every time with ex and better at rest.  Was better while on aleve as was lower back pain   Feels like he's getting over a chest cold now - was worse last week   No obvious day to day or daytime variabilty or assoc sob/   cp or chest tightness,   overt sinus or hb symptoms. No unusual exp hx or h/o childhood pna/ asthma or knowledge of premature birth.  Sleeping ok without nocturnal  or early am exacerbation  of respiratory  c/o's or need for noct saba. Also denies any obvious fluctuation of symptoms with weather or environmental changes or other aggravating or alleviating factors except as outlined above   Current Medications, Allergies, Complete Past Medical History, Past Surgical History, Family History, and Social History were reviewed in [employer] record.  ROS  The following are not active complaints unless bolded sore throat, dysphagia, dental problems, itching, sneezing,  nasal congestion or excess/ purulent secretions, ear  ache,   fever, chills, sweats, unintended wt loss, pleuritic or exertional cp, hemoptysis,  orthopnea pnd or leg swelling, presyncope, palpitations, heartburn, abdominal pain, anorexia, nausea, vomiting, diarrhea  or change in bowel or urinary habits, change in stools or urine, dysuria,hematuria,  rash, arthralgias, visual complaints, headache, numbness weakness or ataxia or problems with walking or coordination,  change in mood/affect or memory.                     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 Neck without JVD/Nodes/TM  Lungs clear to A and P bilaterally without cough on insp or exp maneuvers  RRR no s3 or murmur or increase in P2   LLE  pulses slt reduced vs R , no ededma  Abd soft , no tenderness, BS+ No bruits or organomegaly  EXT no edema,  , nl  pulses     Labs ordered today /  reviewed include:   Recent Labs Lab 05/30/14 1012  NA 138  K 4.0  CL 100  CO2 32  BUN 21  CREATININE 1.65*  GLUCOSE 102*    Recent Labs Lab 05/30/14 1012  HGB 12.7*  HCT 37.2*  WBC 8.1  PLT 211.0     Lab Results  Component Value Date   TSH 2.63 05/30/2014     Lab Results  Component Value Date   PROBNP 132.0* 03/28/2014     Lab Results  Component Value Date   ESRSEDRATE 63* 05/30/2014              Subjective:    Patient ID: Isaac [name], male    DOB: [birth year]   MRN: [id number]    Brief patient profile:  27 yowm with h/o IHD/HBP/hyperlipidemia followed here for Primary care.   History of Present Illness   06/30/2011 f/u ov/Rune Mendez cc mostly dry cough x one month then new onset LLQ abd pain daily not present sleeping but daily waxes and wanes sev hours at a time no obvious trigger with activity or meals/flatus/ bms rec Prednisone 10 mg take  4 each am x 2 days,   2 each am x 2 days,  1 each am x2days and stop For cough use delsym cough syrup and stop fish oil until no longer coughing at all Try prilosec 20mg   Take 30-60 min before first meal of the day and Pepcid 20 mg one bedtime .  GERD diet  07/14/2011 f/u ov/Kellene Mccleary cough gone but  cc persistent x one month LLQ pain  Rad across ant abd to naval area plus difficulty with urination x 5 days  But no flank pain, hematuria, dysuria or use of otcs/antihistamine.  Abd pain waxes and wanes worse for maybe an hour then better but always present a little and not worse coughing. No pattern of pain in terms of alleviating or exacerbating.  >>CT abd pelvis neg > referred to [facility]/ [name]> corrected with L Inguinal sugery 12/2011     05/21/2012 f/u ov/Dashley Monts for f/u hbp cc fell off ladder x one month, better on nsaids still working remodeling, no cp or sob tia or claudication symptoms rec No change rx   August 05 2012 R shoulder surgery outpt  09/06/2012 f/u ov/Reighn [name] comprehensive  yearly eval for multiple med problems Chief Complaint  Patient presents with  . Annual Exam    Had fall x approx 5 months ago- pt c/o right shoulder pain.    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 Follow-up    Pt states doing well and denies any co's today.  Urinates q 2 h with decreased fos but bp low on norvasc and cardura 1 mg qhs.  No dysuria, back pain, fever Stop norvasc( amlodipine)  Increase cardura to 4 mg at bedtime  Ok to take one half if light headed standing   03/07/2013 f/u ov/Nyomi Howser re: hpb /urinary hesitancy Chief Complaint  Patient presents with  . Follow-up    Breathing is unchanged since last OV.  Flu shot today   Since increased cardura > Having more urinary urgency / ? Excess fluid but stream is def better rec Increased the cardura (doxazosin) to 4 mg twice daily and call if urinary frequency does not improve  Reduce fluid intake and only drink when thirsty   11/08/2013 acut ov/Zayon Trulson re: new sob Chief Complaint  Patient presents with  . Follow-up    Pt c/o increased DOE for the past month- gets SOB walking to the mailbox and back.   indolent onset x 6 wks doe,  Sometimes orthopnea, rare though.  Progressed to where  Consistently 50 ft sob Only new problem is cold feet rec Refer to Dr Lovena Le    03/28/2014 Follow up and Med Review (HTN/AR/CAD/CHF-D)  Patient returns for a two-month follow-up Since last visit. He was admitted to the hospital in October for congestive heart failure decompensation.  His condition improved with diuresis. His ACE inhibitor was discontinued due to renal insufficiency He needs follow-up labs today Patient says that he has been feeling improved with  decreased shortness of breath and leg swelling. He is now on Lasix 40 mg daily with the option of using an extra Lasix daily if needed for weight gain, or leg swelling Patient says that he weighs daily at home. He says his dry weight at home have been averaging 177-180 Patient denies any chest pain, orthopnea, PND, or increased leg swelling. Patient's Pneumovax is up-to-date. However, has never had a Prevnar vaccine rec Follow med calendar closely and bring to each visit .  Prevnar vaccine today .  Labs today  Low salt and cholesterol diet.    05/30/2014 f/u ov/Hoyt Leanos re: multiple complaints Chief Complaint  Patient presents with  . Follow-up    pt c/o sob, cough wheezing and has white to lt. green mucus x 1 week. Pt denies chest tightness. Pt is having increased back pain and was told not to take aleve.   chronic / recurrent pain between shoulder blades happens every time with ex and better at rest.  Was better while on aleve as was lower back pain   Feels like he's getting over a chest cold now - was worse last week  rec For arthritis pain try sulindac 200 one twice daily  With meals as needed Increase metaprolol 50 mg to one and a half each am until you see Dr Lovena Le again and let him adjust it further  at that point    10/27/2014 f/u ov/Nigel Ericsson re: hbp/ chronic cough / cp / djd Chief Complaint  Patient presents with  . 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     Note last note by Dr Lovena Le 03/28/14:  back pain with exertion which is his anginal equivalent. He will continue his current medications. I've encouraged the patient to take a nitroglycerin. He is also encouraged to continue exercising daily. No change in medications at this point. No obvious day to day or  daytime variabilty or assoc sob/   cp or chest tightness,   overt sinus or hb symptoms. No unusual exp hx or h/o childhood pna/ asthma or knowledge of premature birth.  Sleeping ok without nocturnal  or early am exacerbation  of respiratory  c/o's or need for noct saba. Also denies any obvious fluctuation of symptoms with weather or environmental changes or other aggravating or alleviating factors except as outlined above   Current Medications, Allergies, Complete Past Medical History, Past Surgical History, Family History, and Social History were reviewed in [employer] record.  ROS  The following are not active complaints unless bolded sore throat, dysphagia, dental problems, itching, sneezing,  nasal congestion or excess/ purulent secretions, ear ache,   fever, chills, sweats, unintended wt loss, pleuritic or exertional cp, hemoptysis,  orthopnea pnd or leg swelling, presyncope, palpitations, heartburn, abdominal pain, anorexia, nausea, vomiting, diarrhea  or change in bowel or urinary habits, change in stools or urine, dysuria,hematuria,  rash, arthralgias, visual complaints, headache, numbness weakness or ataxia or problems with walking or coordination,  change in mood/affect or memory.            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 radiculopathy.........................................Marland KitchenOlin             Objective:   Physical Exam   Ambulatory healthy appearing in no acute distress / tends to ramble and mumble when asked direct questions  wt   192 April 09, 2009 > 190 July 21, 2009 > 187 03/26/2012  > 05/21/2012 187 > 09/06/2012  189 > 12/06/12 192>  189 03/07/2013  > 11/08/2013 191 >  02/10/2014 199 >192 03/28/2014 > 05/30/2014   192 > 10/26/2014    170   HEENT: edentulous  nl turbinates, and orophanx. Neck without JVD/Nodes/TM  Lungs clear to A and P bilaterally without cough on insp or exp maneuvers  RRR no s3 or murmur or increase in P2   LLE  pulses slt reduced vs R , no ededma  Abd soft , no tenderness, BS+ No bruits or organomegaly  EXT no edema,  , nl pulses     Labs   reviewed include:      Lab 05/30/14 1012  NA 138  K 4.0  CL 100  CO2 32  BUN 21  CREATININE 1.65*  GLUCOSE 102*    Recent Labs Lab 05/30/14 1012  HGB 12.7*  HCT 37.2*  WBC 8.1  PLT 211.0     Lab Results  Component Value Date   TSH 2.63 05/30/2014     Lab Results  Component Value Date   PROBNP 132.0* 03/28/2014     Lab Results  Component Value Date   ESRSEDRATE 63* 05/30/2014

## 2014-10-26 NOTE — Patient Instructions (Signed)
Change the metaprolol to where you take one pill in am and a half in pm as per your med calendar  See calendar for specific medication instructions and bring it back for each and every office visit for every healthcare provider you see.  Without it,  you may not receive the best quality medical care that we feel you deserve.  You will note that the calendar groups together  your maintenance  medications that are timed at particular times of the day.  Think of this as your checklist for what your doctor has instructed you to do until your next evaluation to see what benefit  there is  to staying on a consistent group of medications intended to keep you well.  The other group at the bottom is entirely up to you to use as you see fit  for specific symptoms that may arise between visits that require you to treat them on an as needed basis.  Think of this as your action plan or "what if" list.   Separating the top medications from the bottom group is fundamental to providing you adequate care going forward.    Please schedule a follow up visit in6 months but call sooner if needed

## 2014-10-27 ENCOUNTER — Encounter: Payer: Self-pay | Admitting: Internal Medicine

## 2014-10-27 ENCOUNTER — Ambulatory Visit (AMBULATORY_SURGERY_CENTER): Payer: Self-pay | Admitting: *Deleted

## 2014-10-27 VITALS — Ht 70.5 in | Wt 170.0 lb

## 2014-10-27 DIAGNOSIS — R059 Cough, unspecified: Secondary | ICD-10-CM | POA: Insufficient documentation

## 2014-10-27 DIAGNOSIS — R05 Cough: Secondary | ICD-10-CM | POA: Insufficient documentation

## 2014-10-27 DIAGNOSIS — Z8719 Personal history of other diseases of the digestive system: Secondary | ICD-10-CM

## 2014-10-27 NOTE — Assessment & Plan Note (Signed)
Doubt the cough is related to the high dose lopressor but he does appear to be taking too much in am so rec 50 mg in am and 25 mg in pm absent any increase in angina or need for NTG but certainly defer to Dr Lovena Le

## 2014-10-27 NOTE — Assessment & Plan Note (Signed)
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

## 2014-10-27 NOTE — Assessment & Plan Note (Signed)
Adequate control on present rx, reviewed > no change in rx needed   

## 2014-10-27 NOTE — Assessment & Plan Note (Signed)
The most common causes of chronic cough in immunocompetent adults include the following: upper airway cough syndrome (UACS), previously referred to as postnasal drip syndrome (PNDS), which is caused by variety of rhinosinus conditions; (2) asthma; (3) GERD; (4) chronic bronchitis from cigarette smoking or other inhaled environmental irritants; (5) nonasthmatic eosinophilic bronchitis; and (6) bronchiectasis.   These conditions, singly or in combination, have accounted for up to 94% of the causes of chronic cough in prospective studies.   Other conditions have constituted no >6% of the causes in prospective studies These have included bronchogenic carcinoma, chronic interstitial pneumonia, sarcoidosis, left ventricular failure, ACEI-induced cough, and aspiration from a condition associated with pharyngeal dysfunction.    Chronic cough is often simultaneously caused by more than one condition. A single cause has been found from 38 to 82% of the time, multiple causes from 18 to 62%. Multiply caused cough has been the result of three diseases up to 42% of the time.       Based on hx and exam, this is most likely:  Classic Upper airway cough syndrome, so named because it's frequently impossible to sort out how much is  CR/sinusitis with freq throat clearing (which can be related to primary GERD)   vs  causing  secondary (" extra esophageal")  GERD from wide swings in gastric pressure that occur with throat clearing, often  promoting self use of mint and menthol lozenges that reduce the lower esophageal sphincter tone and exacerbate the problem further in a cyclical fashion.   These are the same pts (now being labeled as having "irritable larynx syndrome" by some cough centers) who not infrequently have a history of having failed to tolerate ace inhibitors,  dry powder inhalers or biphosphonates or report having atypical reflux symptoms that don't respond to standard doses of PPI , and are easily confused as  having aecopd or asthma flares by even experienced allergists/ pulmonologists.   The first step is to continue  maximize acid suppression and treatment directed at rhinitis / pnds regroup if the cough persists enough for him to want to pogress  through the cough algorithm.    I had an extended discussion with the patient reviewing all relevant studies completed to date and  lasting 15 to 20 minutes of a 25 minute visit   Each maintenance medication was reviewed in detail including most importantly the difference between maintenance and as needed and under what circumstances the prns are to be used. This was done in the context of a new updated medication calendar review which provided the patient with a user-friendly unambiguous mechanism for medication administration and reconciliation and provides an action plan for all active problems. It is critical that this be shown to every doctor  for modification during the office visit if necessary so the patient can use it as a working document.    [name]

## 2014-10-27 NOTE — Progress Notes (Signed)
No allergies to eggs or soy. No problems with anesthesia.  Pt not given Emmi instructions for EGD; no computer access  No oxygen use  No diet drug use

## 2014-10-31 ENCOUNTER — Other Ambulatory Visit: Payer: Self-pay | Admitting: Internal Medicine

## 2014-10-31 ENCOUNTER — Encounter: Payer: Self-pay | Admitting: Gastroenterology

## 2014-11-10 ENCOUNTER — Encounter: Payer: Self-pay | Admitting: Gastroenterology

## 2014-11-10 ENCOUNTER — Ambulatory Visit (AMBULATORY_SURGERY_CENTER): Payer: Medicare Other | Admitting: Gastroenterology

## 2014-11-10 VITALS — BP 133/78 | HR 53 | Temp 96.1°F | Resp 26 | Ht 70.5 in | Wt 170.0 lb

## 2014-11-10 DIAGNOSIS — I10 Essential (primary) hypertension: Secondary | ICD-10-CM | POA: Diagnosis not present

## 2014-11-10 DIAGNOSIS — K2970 Gastritis, unspecified, without bleeding: Secondary | ICD-10-CM

## 2014-11-10 DIAGNOSIS — K2270 Barrett's esophagus without dysplasia: Secondary | ICD-10-CM

## 2014-11-10 DIAGNOSIS — K2950 Unspecified chronic gastritis without bleeding: Secondary | ICD-10-CM | POA: Diagnosis not present

## 2014-11-10 MED ORDER — SODIUM CHLORIDE 0.9 % IV SOLN: 500.0000 mL | INTRAVENOUS | Status: DC

## 2014-11-10 NOTE — Progress Notes (Signed)
Transferred to recovery room. A/O x3, pleased with MAC.  VSS.  Report to Wendy, RN. 

## 2014-11-10 NOTE — Op Note (Signed)
[place]  [address] [place], [address]   ENDOSCOPY PROCEDURE REPORT  PATIENT: Isaac [name], Isaac [name]  MR#: [id number] BIRTHDATE: 1940/10/05 , 73  yrs. old GENDER: male ENDOSCOPIST: Inda Castle, MD REFERRED BY:  Tanda Rockers, M.D. PROCEDURE DATE:  11/10/2014 PROCEDURE:  EGD w/ biopsy ASA CLASS:     Class II INDICATIONS:  history of Barrett's esophagus. MEDICATIONS: Monitored anesthesia care and Propofol 170 mg IV TOPICAL ANESTHETIC:  DESCRIPTION OF PROCEDURE: After the risks benefits and alternatives of the procedure were thoroughly explained, informed consent was obtained.  The LB [id number] endoscope was introduced through the mouth and advanced to the second portion of the duodenum , Without limitations.  The instrument was slowly withdrawn as the mucosa was fully examined.      ESOPHAGUS: There was long segment Barrett's esophagus without dysplasia found 30 cm from the incisors.  The length of circumferential Barrett's was 8cm ([place] C8).  There was no nodular mucosa noted in the Barrett's segment.  Multiple biopsies were performed.  STOMACH: Mild erosive gastritis (inflammation) was found in the gastric antrum.  Multiple biopsies were performed.   A 3 cm hiatal hernia was noted.  Retroflexed views revealed no abnormalities. The scope was then withdrawn from the patient and the procedure completed.  COMPLICATIONS: There were no immediate complications.  ENDOSCOPIC IMPRESSION: 1.   There was long segment Barrett's esophagus w/o dysplasia found 30 cm from the incisors; multiple biopsies were performed 2.   Erosive gastritis (inflammation) was found in the gastric antrum; multiple biopsies were performed 3.   3 cm hiatal hernia  RECOMMENDATIONS: 1.  await biopsy results 2.  continue omeprazole  REPEAT EXAM:  eSigned:  Inda Castle, MD 11/10/2014 2:51 PM    CC:  PATIENT NAME:  Isaac [name], Isaac [name] MR#: [id number]

## 2014-11-10 NOTE — Patient Instructions (Signed)
YOU HAD AN ENDOSCOPIC PROCEDURE TODAY AT [facility]:   Refer to the procedure report that was given to you for any specific questions about what was found during the examination.  If the procedure report does not answer your questions, please call your gastroenterologist to clarify.  If you requested that your care partner not be given the details of your procedure findings, then the procedure report has been included in a sealed envelope for you to review at your convenience later.  YOU SHOULD EXPECT: Some feelings of bloating in the abdomen. Passage of more gas than usual.  Walking can help get rid of the air that was put into your GI tract during the procedure and reduce the bloating. If you had a lower endoscopy (such as a colonoscopy or flexible sigmoidoscopy) you may notice spotting of blood in your stool or on the toilet paper. If you underwent a bowel prep for your procedure, you may not have a normal bowel movement for a few days.  Please Note:  You might notice some irritation and congestion in your nose or some drainage.  This is from the oxygen used during your procedure.  There is no need for concern and it should clear up in a day or so.  SYMPTOMS TO REPORT IMMEDIATELY:   Following upper endoscopy (EGD)  Vomiting of blood or coffee ground material  New chest pain or pain under the shoulder blades  Painful or persistently difficult swallowing  New shortness of breath  Fever of 100F or higher  Black, tarry-looking stools  For urgent or emergent issues, a gastroenterologist can be reached at any hour by calling [phone].   DIET: Your first meal following the procedure should be a small meal and then it is ok to progress to your normal diet. Heavy or fried foods are harder to digest and may make you feel nauseous or bloated.  Likewise, meals heavy in dairy and vegetables can increase bloating.  Drink plenty of fluids but you should avoid alcoholic beverages for  24 hours.  ACTIVITY:  You should plan to take it easy for the rest of today and you should NOT DRIVE or use heavy machinery until tomorrow (because of the sedation medicines used during the test).    FOLLOW UP: Our staff will call the number listed on your records the next business day following your procedure to check on you and address any questions or concerns that you may have regarding the information given to you following your procedure. If we do not reach you, we will leave a message.  However, if you are feeling well and you are not experiencing any problems, there is no need to return our call.  We will assume that you have returned to your regular daily activities without incident.  If any biopsies were taken you will be contacted by phone or by letter within the next 1-3 weeks.  Please call us at [phone] if you have not heard about the biopsies in 3 weeks.    SIGNATURES/CONFIDENTIALITY: You and/or your care partner have signed paperwork which will be entered into your electronic medical record.  These signatures attest to the fact that that the information above on your After Visit Summary has been reviewed and is understood.  Full responsibility of the confidentiality of this discharge information lies with you and/or your care-partner.  Biopsy results pending. Please review gastritis handout provided.

## 2014-11-10 NOTE — Progress Notes (Signed)
Called to room to assist during endoscopic procedure.  Patient ID and intended procedure confirmed with present staff. Received instructions for my participation in the procedure from the performing physician.  

## 2014-11-13 ENCOUNTER — Telehealth: Payer: Self-pay | Admitting: *Deleted

## 2014-11-13 NOTE — Telephone Encounter (Signed)
  Follow up Call-  Call back number 11/10/2014  Post procedure Call Back phone  # [phone]  Permission to leave phone message Yes     Patient questions:  Do you have a fever, pain , or abdominal swelling? No. Pain Score  0 *  Have you tolerated food without any problems? Yes.    Have you been able to return to your normal activities? Yes.    Do you have any questions about your discharge instructions: Diet   No. Medications  No. Follow up visit  No.  Do you have questions or concerns about your Care? No.  Actions: * If pain score is 4 or above: No action needed, pain <4.

## 2014-11-15 ENCOUNTER — Other Ambulatory Visit: Payer: Self-pay | Admitting: Internal Medicine

## 2014-11-15 ENCOUNTER — Encounter: Payer: Self-pay | Admitting: Internal Medicine

## 2014-11-15 ENCOUNTER — Ambulatory Visit (INDEPENDENT_AMBULATORY_CARE_PROVIDER_SITE_OTHER): Payer: Medicare Other | Admitting: Internal Medicine

## 2014-11-15 VITALS — BP 132/68 | HR 72 | Ht 70.5 in | Wt 170.2 lb

## 2014-11-15 DIAGNOSIS — I200 Unstable angina: Secondary | ICD-10-CM

## 2014-11-15 NOTE — Patient Instructions (Addendum)
Medication Instructions:   Your physician recommends that you continue on your current medications as directed. Please refer to the Current Medication list given to you today.    Labwork:   Testing/Procedures:   Follow-Up:   Your physician wants you to follow-up in:  [place] will receive a reminder letter in the mail two months in advance. If you don't receive a letter, please call our office to schedule the follow-up appointment.    Any Other Special Instructions Will Be Listed Below (If Applicable).

## 2014-11-15 NOTE — Progress Notes (Signed)
HPI [name] returns today for followup. 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 He had two occluded grafts but a patent [place] to the lad and SVG to the diagonal. He denies chest pain or back pain and has no limitations in his activity.  No Known Allergies   Current Outpatient Prescriptions  Medication Sig Dispense Refill  . aspirin (ADULT ASPIRIN EC LOW STRENGTH) 81 MG EC tablet Take 81 mg by mouth every morning.     . Cholecalciferol (VITAMIN D) 2000 UNITS tablet Take 2,000 Units by mouth daily.     [name] doxazosin (CARDURA) 4 MG tablet TAKE 1 TABLET IN THE MORNING AND 1 TABLET AT BEDTIME (Patient taking differently: TAKE 1 TABLET BY MOUTH  IN THE MORNING AND 1 TABLET AT BEDTIME) 180 tablet 0  . fluticasone (FLONASE) 50 MCG/ACT nasal spray Place 2 sprays into both nostrils daily.    . furosemide (LASIX) 40 MG tablet Take 40 mg by mouth daily. May take one extra tablet daily as needed for swelling    . isosorbide mononitrate (IMDUR) 30 MG 24 hr tablet TAKE ONE TABLET BY MOUTH ONCE DAILY IN THE EVENING 30 tablet 0  . metoprolol succinate (TOPROL-XL) 50 MG 24 hr tablet Take 1 tablet by mouth in the morning and take 1/2 tablet by mouth at bedtime. Take with or immediately following a meal.    . Multiple Vitamin (MULTIVITAMIN WITH MINERALS) TABS Take 1 tablet by mouth daily.     . multivitamin-lutein (OCUVITE-LUTEIN) CAPS capsule Take 1 capsule by mouth daily.    . nitroGLYCERIN (NITROSTAT) 0.4 MG SL tablet Place 0.4 mg under the tongue every 5 (five) minutes as needed for chest pain (MAX 3 TABLETS).     [name] omeprazole (PRILOSEC) 20 MG capsule Take 20 mg by mouth daily.     [name] oxymetazoline (AFRIN) 0.05 % nasal spray Place 2 sprays into the nose every 12 (twelve) hours as needed for congestion (x3 days). Dryness    . potassium chloride SA  (KLOR-CON M20) 20 MEQ tablet TAKE ONE TABLET BY MOUTH ONCE DAILY WHEN  YOU  TAKE  LASIX 30 tablet 0  . Simethicone (GAS-X EXTRA STRENGTH) 125 MG CAPS Take according to instructions on label    . simvastatin (ZOCOR) 20 MG tablet TAKE 1 TABLET AT BEDTIME (Patient taking differently: TAKE 1 TABLET BY MOUTH  AT BEDTIME) 90 tablet 0  . sulindac (CLINORIL) 200 MG tablet Take 200 mg by mouth 2 (two) times daily as needed (joint pain).     No current facility-administered medications for this visit.     Past Medical History  Diagnosis Date  . Ischemic heart disease   . Hypertension   . Hyperlipidemia   . Allergic rhinitis   . Lumbar disc disease   . GERD (gastroesophageal reflux disease)   . H/O hiatal hernia   . History of skin cancer     bilateral arms with removal  . CAD (coronary artery disease) of artery bypass graft, Occluded VG-non dominant LCX medical therapy 11/25/2013  . Allergy   . Myocardial infarction 2002    ROS:   All systems reviewed and negative except as noted in the HPI.   Past Surgical History  Procedure Laterality Date  . Coronary artery bypass graft  2002    LIMA-LAD, SVG-Diag, SVG-RCA  .  Inguinal hernia repair Left 1983  . Nose surgery  1985  . Colonoscopy    . Cataract extraction Bilateral 1993/08/07  . Groin dissection  01/23/2012    Procedure: GROIN EXPLORATION;  Surgeon: Harl Bowie, MD;  Location: [facility];  Service: General;  Laterality: Left;  Left Inguinal Exploration, release of scar tissue, Placement of Mesh  . Left heart catheterization with coronary angiogram N/A 11/24/2013    Procedure: LEFT HEART CATHETERIZATION WITH CORONARY ANGIOGRAM;  Surgeon: Peter M Martinique, MD;  Location: [facility] CATH LAB;  Service: Cardiovascular;  Laterality: N/A;     Family History  Problem Relation Age of Onset  . Breast cancer Mother   . Diabetes Mother   . Hypertension Father   . Dementia Father   . Diabetes Sister   . COPD Brother   . Colon cancer Neg Hx       History   Social History  . Marital Status: Married    Spouse Name: N/A  . Number of Children: 0  . Years of Education: N/A   Occupational History  . Retired    Social History Main Topics  . Smoking status: Never Smoker   . Smokeless tobacco: Former Systems developer    Quit date: 04/28/2014  . Alcohol Use: No  . Drug Use: No  . Sexual Activity: No   Other Topics Concern  . Not on file   Social History Narrative   Remarried in August 08, 2003 after first wife died of emphysema in 08/07/2001. Retired Architect.  Drives school bus.      BP 132/68 mmHg  Pulse 72  Ht 5' 10.5" (1.791 m)  Wt 170 lb 3.2 oz (77.202 kg)  BMI 24.07 kg/m2  Physical Exam:  Well appearing 74 yo man, NAD HEENT: Unremarkable Neck:  7 cm JVD, no thyromegally Back:  No CVA tenderness Lungs:  Clear with no wheezes HEART:  Regular rate rhythm, no murmurs, no rubs, no clicks Abd:  soft, positive bowel sounds, no organomegally, no rebound, no guarding Ext:  2 plus pulses, no edema, no cyanosis, no clubbing Skin:  No rashes no nodules Neuro:  CN II through XII intact, motor grossly intact   Assess/Plan:

## 2014-11-17 ENCOUNTER — Encounter: Payer: Self-pay | Admitting: Gastroenterology

## 2014-12-04 ENCOUNTER — Telehealth: Payer: Self-pay | Admitting: Internal Medicine

## 2014-12-04 MED ORDER — ISOSORBIDE MONONITRATE ER 30 MG PO TB24: ORAL_TABLET | ORAL | Status: DC

## 2014-12-04 NOTE — Telephone Encounter (Signed)
Rx has been sent in. Pt is aware. Nothing further was needed. 

## 2014-12-07 ENCOUNTER — Other Ambulatory Visit: Payer: Self-pay | Admitting: Internal Medicine

## 2014-12-08 ENCOUNTER — Other Ambulatory Visit: Payer: Self-pay | Admitting: Internal Medicine

## 2015-01-20 ENCOUNTER — Other Ambulatory Visit: Payer: Self-pay | Admitting: Internal Medicine

## 2015-01-23 ENCOUNTER — Encounter: Payer: Self-pay | Admitting: Gastroenterology

## 2015-01-28 ENCOUNTER — Other Ambulatory Visit: Payer: Self-pay | Admitting: Internal Medicine

## 2015-02-16 ENCOUNTER — Other Ambulatory Visit: Payer: Self-pay | Admitting: Internal Medicine

## 2015-04-04 ENCOUNTER — Other Ambulatory Visit: Payer: Self-pay | Admitting: Internal Medicine

## 2015-04-04 ENCOUNTER — Telehealth: Payer: Self-pay | Admitting: Internal Medicine

## 2015-04-04 NOTE — Telephone Encounter (Signed)
Called pt. And left voicemail in regards to a rx.refill request on his imdur needing a verification if pt. Is taking med. and how he is taking it.

## 2015-04-17 ENCOUNTER — Ambulatory Visit (INDEPENDENT_AMBULATORY_CARE_PROVIDER_SITE_OTHER): Payer: Medicare Other | Admitting: Internal Medicine

## 2015-04-17 ENCOUNTER — Encounter: Payer: Self-pay | Admitting: Internal Medicine

## 2015-04-17 VITALS — BP 132/64 | HR 61 | Ht 70.5 in | Wt 169.6 lb

## 2015-04-17 DIAGNOSIS — I5033 Acute on chronic diastolic (congestive) heart failure: Secondary | ICD-10-CM

## 2015-04-17 DIAGNOSIS — I200 Unstable angina: Secondary | ICD-10-CM

## 2015-04-17 NOTE — Patient Instructions (Signed)

## 2015-04-17 NOTE — Assessment & Plan Note (Signed)
His symptoms are class 2. He will continue his current meds. He is encouraged to maintain a low sodium diet. 

## 2015-04-17 NOTE — Progress Notes (Signed)
HPI Isaac [name] returns today for followup. 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 He had two occluded grafts but a patent [place] to the [place] and SVG to the diagonal. He denies chest pain or back pain and has no limitations in his activity.   No Known Allergies   Current Outpatient Prescriptions  Medication Sig Dispense Refill  . aspirin (ADULT ASPIRIN EC LOW STRENGTH) 81 MG EC tablet Take 81 mg by mouth every morning.     . Cholecalciferol (VITAMIN D) 2000 UNITS tablet Take 2,000 Units by mouth daily.     [name] doxazosin (CARDURA) 4 MG tablet TAKE 1 TABLET IN THE MORNING AND 1 TABLET AT BEDTIME 180 tablet 0  . fluticasone (FLONASE) 50 MCG/ACT nasal spray Place 2 sprays into both nostrils daily.    . furosemide (LASIX) 40 MG tablet Take 40 mg by mouth daily. May take one extra tablet daily as needed for swelling    . isosorbide mononitrate (IMDUR) 30 MG 24 hr tablet TAKE ONE TABLET BY MOUTH ONCE DAILY IN THE EVENING 30 tablet 2  . KLOR-CON M20 20 MEQ tablet TAKE ONE TABLET BY MOUTH ONCE DAILY WHEN  YOU  TAKE LASIX 30 tablet 3  . metoprolol succinate (TOPROL-XL) 50 MG 24 hr tablet 1 every am and 1/2 in the pm 45 tablet 11  . Multiple Vitamin (MULTIVITAMIN WITH MINERALS) TABS Take 1 tablet by mouth daily.     . multivitamin-lutein (OCUVITE-LUTEIN) CAPS capsule Take 1 capsule by mouth daily.    [name] NASONEX 50 MCG/ACT nasal spray USE TWO SPRAY(S) IN EACH NOSTRIL AT BEDTIME 17 g 3  . nitroGLYCERIN (NITROSTAT) 0.4 MG SL tablet Place 0.4 mg under the tongue every 5 (five) minutes as needed for chest pain (MAX 3 TABLETS).     [name] omeprazole (PRILOSEC) 20 MG capsule TAKE 1 CAPSULE TWICE A DAY 180 capsule 1  . oxymetazoline (AFRIN) 0.05 % nasal spray Place 2 sprays into the nose every 12 (twelve) hours as needed for congestion (x3 days). Dryness    .  Simethicone (GAS-X EXTRA STRENGTH) 125 MG CAPS Take according to instructions on label    . simvastatin (ZOCOR) 20 MG tablet TAKE 1 TABLET AT BEDTIME 90 tablet 0  . sulindac (CLINORIL) 200 MG tablet Take 200 mg by mouth 2 (two) times daily as needed (joint pain).     No current facility-administered medications for this visit.     Past Medical History  Diagnosis Date  . Ischemic heart disease   . Hypertension   . Hyperlipidemia   . Allergic rhinitis   . Lumbar disc disease   . GERD (gastroesophageal reflux disease)   . H/O hiatal hernia   . History of skin cancer     bilateral arms with removal  . CAD (coronary artery disease) of artery bypass graft, Occluded VG-non dominant LCX medical therapy 11/25/2013  . Allergy   . Myocardial infarction ([place]) 2002    ROS:   All systems reviewed and negative except as noted in the HPI.   Past Surgical History  Procedure Laterality Date  . Coronary artery bypass graft  2002    LIMA-LAD, SVG-Diag, SVG-RCA  . Inguinal hernia repair Left 1983  . Nose surgery  1985  . Colonoscopy    . Cataract extraction Bilateral 1995,2000  . Groin dissection  01/23/2012    Procedure: Virl Son EXPLORATION;  Surgeon: Harl Bowie, MD;  Location: [facility];  Service: General;  Laterality: Left;  Left Inguinal Exploration, release of scar tissue, Placement of Mesh  . Left heart catheterization with coronary angiogram N/A 11/24/2013    Procedure: LEFT HEART CATHETERIZATION WITH CORONARY ANGIOGRAM;  Surgeon: Peter M Martinique, MD;  Location: [facility] CATH LAB;  Service: Cardiovascular;  Laterality: N/A;     Family History  Problem Relation Age of Onset  . Breast cancer Mother   . Diabetes Mother   . Hypertension Father   . Dementia Father   . Diabetes Sister   . COPD Brother   . Colon cancer Neg Hx      Social History   Social History  . Marital Status: Married    Spouse Name: N/A  . Number of Children: 0  . Years of Education: N/A   Occupational  History  . Retired    Social History Main Topics  . Smoking status: Never Smoker   . Smokeless tobacco: Former Systems developer    Quit date: 04/28/2014  . Alcohol Use: No  . Drug Use: No  . Sexual Activity: No   Other Topics Concern  . Not on file   Social History Narrative   Remarried in 07/31/2003 after first wife died of emphysema in 07/30/2001. Retired Architect.  Drives school bus.      BP 132/64 mmHg  Pulse 61  Ht 5' 10.5" (1.791 m)  Wt 169 lb 9.6 oz (76.93 kg)  BMI 23.98 kg/m2  Physical Exam:  Well appearing 74 yo man, NAD HEENT: Unremarkable Neck:  7 cm JVD, no thyromegally Back:  No CVA tenderness Lungs:  Clear with no wheezes HEART:  Regular rate rhythm, no murmurs, no rubs, no clicks Abd:  soft, positive bowel sounds, no organomegally, no rebound, no guarding Ext:  2 plus pulses, no edema, no cyanosis, no clubbing Skin:  No rashes no nodules Neuro:  CN II through XII intact, motor grossly intact   Assess/Plan:

## 2015-04-21 ENCOUNTER — Other Ambulatory Visit: Payer: Self-pay | Admitting: Internal Medicine

## 2015-04-24 ENCOUNTER — Ambulatory Visit: Payer: BC Managed Care – PPO | Admitting: Internal Medicine

## 2015-04-27 ENCOUNTER — Other Ambulatory Visit: Payer: Self-pay | Admitting: Internal Medicine

## 2015-05-15 ENCOUNTER — Other Ambulatory Visit: Payer: Self-pay | Admitting: Internal Medicine

## 2015-05-22 ENCOUNTER — Encounter: Payer: Self-pay | Admitting: Internal Medicine

## 2015-05-22 ENCOUNTER — Ambulatory Visit (INDEPENDENT_AMBULATORY_CARE_PROVIDER_SITE_OTHER): Payer: Medicare Other | Admitting: Internal Medicine

## 2015-05-22 ENCOUNTER — Other Ambulatory Visit (INDEPENDENT_AMBULATORY_CARE_PROVIDER_SITE_OTHER): Payer: Medicare Other

## 2015-05-22 VITALS — BP 126/72 | HR 63 | Temp 98.2°F | Ht 70.0 in | Wt 168.0 lb

## 2015-05-22 DIAGNOSIS — I119 Hypertensive heart disease without heart failure: Secondary | ICD-10-CM | POA: Diagnosis not present

## 2015-05-22 DIAGNOSIS — N184 Chronic kidney disease, stage 4 (severe): Secondary | ICD-10-CM

## 2015-05-22 DIAGNOSIS — N183 Chronic kidney disease, stage 3 unspecified: Secondary | ICD-10-CM

## 2015-05-22 DIAGNOSIS — R05 Cough: Secondary | ICD-10-CM

## 2015-05-22 DIAGNOSIS — R059 Cough, unspecified: Secondary | ICD-10-CM

## 2015-05-22 DIAGNOSIS — E785 Hyperlipidemia, unspecified: Secondary | ICD-10-CM

## 2015-05-22 HISTORY — DX: Chronic kidney disease, stage 3 unspecified: N18.30

## 2015-05-22 LAB — CBC WITH DIFFERENTIAL/PLATELET
BASOS ABS: 0.1 10*3/uL (ref 0.0–0.1)
BASOS PCT: 0.6 % (ref 0.0–3.0)
EOS ABS: 0.7 10*3/uL (ref 0.0–0.7)
Eosinophils Relative: 7.5 % — ABNORMAL HIGH (ref 0.0–5.0)
HEMATOCRIT: 38.6 % — AB (ref 39.0–52.0)
Hemoglobin: 12.9 g/dL — ABNORMAL LOW (ref 13.0–17.0)
LYMPHS PCT: 23.7 % (ref 12.0–46.0)
Lymphs Abs: 2.3 10*3/uL (ref 0.7–4.0)
MCHC: 33.3 g/dL (ref 30.0–36.0)
MCV: 93.1 fl (ref 78.0–100.0)
MONO ABS: 0.8 10*3/uL (ref 0.1–1.0)
Monocytes Relative: 8.2 % (ref 3.0–12.0)
NEUTROS ABS: 5.8 10*3/uL (ref 1.4–7.7)
NEUTROS PCT: 60 % (ref 43.0–77.0)
PLATELETS: 177 10*3/uL (ref 150.0–400.0)
RBC: 4.15 Mil/uL — ABNORMAL LOW (ref 4.22–5.81)
RDW: 14.7 % (ref 11.5–15.5)
WBC: 9.6 10*3/uL (ref 4.0–10.5)

## 2015-05-22 LAB — LIPID PANEL
CHOLESTEROL: 156 mg/dL (ref 0–200)
HDL: 46 mg/dL (ref >39.00)
NONHDL: 109.51
TRIGLYCERIDES: 219 mg/dL — AB (ref 0.0–149.0)
Total CHOL/HDL Ratio: 3
VLDL: 43.8 mg/dL — ABNORMAL HIGH (ref 0.0–40.0)

## 2015-05-22 LAB — HEPATIC FUNCTION PANEL
ALBUMIN: 4.2 g/dL (ref 3.5–5.2)
ALK PHOS: 73 U/L (ref 39–117)
ALT: 13 U/L (ref 0–53)
AST: 15 U/L (ref 0–37)
BILIRUBIN DIRECT: 0.1 mg/dL (ref 0.0–0.3)
TOTAL PROTEIN: 7.8 g/dL (ref 6.0–8.3)
Total Bilirubin: 0.5 mg/dL (ref 0.2–1.2)

## 2015-05-22 LAB — BASIC METABOLIC PANEL
BUN: 39 mg/dL — ABNORMAL HIGH (ref 6–23)
CALCIUM: 10 mg/dL (ref 8.4–10.5)
CO2: 26 mEq/L (ref 19–32)
Chloride: 105 mEq/L (ref 96–112)
Creatinine, Ser: 2.81 mg/dL — ABNORMAL HIGH (ref 0.40–1.50)
GFR: 23.54 mL/min — AB (ref >60.00)
GLUCOSE: 105 mg/dL — AB (ref 70–99)
Potassium: 4.8 mEq/L (ref 3.5–5.1)
SODIUM: 140 meq/L (ref 135–145)

## 2015-05-22 LAB — TSH: TSH: 2.05 u[IU]/mL (ref 0.35–4.50)

## 2015-05-22 LAB — LDL CHOLESTEROL, DIRECT: Direct LDL: 75 mg/dL

## 2015-05-22 MED ORDER — AMOXICILLIN-POT CLAVULANATE 875-125 MG PO TABS: 1.0000 | ORAL_TABLET | Freq: Two times a day (BID) | ORAL | Status: DC

## 2015-05-22 NOTE — Patient Instructions (Addendum)
Please remember to go to the lab department downstairs for your tests - we will call you with the results when they are available.  Augmentin 875 mg take one pill twice daily  X 10 days - take at breakfast and supper with large glass of water.  It would help reduce the usual side effects (diarrhea and yeast infections) if you ate cultured yogurt at lunch.   Try over the counter ear wax kit nightly x 2 weeks  See Tammy NP w/in 2 weeks or next avail after than for wax removal .  Add: trial off lasix/ppi/clinoril /Kdur plus u/s and renal eval

## 2015-05-22 NOTE — Assessment & Plan Note (Signed)
C/w CR/sinusitis > try augmentin x 10 days then sinus ct

## 2015-05-22 NOTE — Assessment & Plan Note (Signed)
Lab Results  Component Value Date   CREATININE 2.81* 05/22/2015   CREATININE 1.65* 05/30/2014   CREATININE 1.6* 03/28/2014   CREATININE 1.33 10/13/2011     Needs trial off lasix/ppi/clinoril / [place] and renal eval

## 2015-05-22 NOTE — Progress Notes (Signed)
Subjective:    Patient ID: Isaac [name], male    DOB: [birth year]   MRN: [id number]    Brief patient profile:  51 yowm with h/o IHD/HBP/hyperlipidemia followed here for Primary care.   History of Present Illness   06/30/2011 f/u ov/[name] cc mostly dry cough x one month then new onset LLQ abd pain daily not present sleeping but daily waxes and wanes sev hours at a time no obvious trigger with activity or meals/flatus/ bms rec Prednisone 10 mg take  4 each am x 2 days,   2 each am x 2 days,  1 each am x2days and stop For cough use delsym cough syrup and stop fish oil until no longer coughing at all Try prilosec 20mg   Take 30-60 min before first meal of the day and Pepcid 20 mg one bedtime .  GERD diet  07/14/2011 f/u ov/[name] cough gone but  cc persistent x one month LLQ pain  Rad across ant abd to naval area plus difficulty with urination x 5 days  But no flank pain, hematuria, dysuria or use of otcs/antihistamine.  Abd pain waxes and wanes worse for maybe an hour then better but always present a little and not worse coughing. No pattern of pain in terms of alleviating or exacerbating.  >>CT abd pelvis neg > referred to [facility]/ [name]> corrected with L Inguinal sugery 12/2011     05/21/2012 f/u ov/[name] for f/u hbp cc fell off ladder x one month, better on nsaids still working remodeling, no cp or sob tia or claudication symptoms rec No change rx   August 05 2012 R shoulder surgery outpt  09/06/2012 f/u ov/[name] comprehensive yearly eval for multiple med problems Chief Complaint  Patient presents with  . Annual Exam    Had fall x approx 5 months ago- pt c/o right shoulder pain.    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 Follow-up    Pt states doing well and denies any co's today.  Urinates q 2 h with decreased fos but bp low on norvasc and cardura 1 mg qhs.  No dysuria, back pain, fever Stop norvasc( amlodipine)  Increase cardura to 4 mg at bedtime  Ok to take one half if light headed standing   03/07/2013 f/u ov/[name] re: hpb /urinary hesitancy Chief Complaint  Patient presents with  . Follow-up    Breathing is unchanged since last OV.  Flu shot today   Since increased cardura > Having more urinary urgency / ? Excess fluid but stream is def better rec Increased the cardura (doxazosin) to 4 mg twice daily and call if urinary frequency does not improve  Reduce fluid intake and only drink when thirsty   11/08/2013 acut ov/[name] re: new sob Chief Complaint  Patient presents with  . Follow-up    Pt c/o increased DOE for the past month- gets SOB walking to the mailbox and back.   indolent onset x 6 wks doe,  Sometimes orthopnea, rare though.  Progressed to where  Consistently 50 ft sob Only new problem is cold feet rec Refer to Dr Lovena Le   02/10/2014 f/u ov/[name] re:  Hbp/ chronic rhinitis/ chronic  doe with 2 occulded grafts but good LIMA  Chief Complaint  Patient presents with  . Follow-up    DOE; some chest tightness, some productive cough, mostly non-productive, thinks he may be catching cold  has not tried nitrostat for Sob or pain between shoulder blades with exertion but overall tolerating more activity than prior to Cath  Sore throat x 24 h/ no purulent sputum. Nose stuffy with dry mouth, out of nasonex, did not remember afrin rule. Confused with meds/ instructions  >>no changes   03/28/2014 Follow up and Med Review (HTN/AR/CAD/CHF-D)  Patient returns for a two-month follow-up Since last visit. He was admitted to the hospital in October for congestive heart failure decompensation.  His condition improved with diuresis. His ACE inhibitor was discontinued due to renal insufficiency He  needs follow-up labs today Patient says that he has been feeling improved with decreased shortness of breath and leg swelling. He is now on Lasix 40 mg daily with the option of using an extra Lasix daily if needed for weight gain, or leg swelling Patient says that he weighs daily at home. He says his dry weight at home have been averaging 177-180 Patient denies any chest pain, orthopnea, PND, or increased leg swelling. Patient's Pneumovax is up-to-date. However, has never had a Prevnar vaccine rec Follow med calendar closely and bring to each visit .  Prevnar vaccine today .  Labs today  Low salt and cholesterol diet.    05/30/2014 f/u ov/[name] re: multiple complaints Chief Complaint  Patient presents with  . Follow-up    pt c/o sob, cough wheezing and has white to lt. green mucus x 1 week. Pt denies chest tightness. Pt is having increased back pain and was told not to take aleve.   chronic / recurrent pain between shoulder blades happens every time with ex and better at rest.  Was better while on aleve as was lower back pain   Feels like he's getting over a chest cold now - was worse last week   No obvious day to day or daytime variabilty or assoc sob/   cp or chest tightness,   overt sinus or hb symptoms. No unusual exp hx or h/o childhood pna/ asthma or knowledge of premature birth.  Sleeping ok without nocturnal  or early am exacerbation  of respiratory  c/o's or need for noct saba. Also denies any obvious fluctuation of symptoms with weather or environmental changes or other aggravating or alleviating factors except as outlined above   Current Medications, Allergies, Complete Past Medical History, Past Surgical History, Family History, and Social History were reviewed in [employer] record.  ROS  The following are not active complaints unless bolded sore throat, dysphagia, dental problems, itching, sneezing,  nasal congestion or excess/ purulent secretions, ear  ache,   fever, chills, sweats, unintended wt loss, pleuritic or exertional cp, hemoptysis,  orthopnea pnd or leg swelling, presyncope, palpitations, heartburn, abdominal pain, anorexia, nausea, vomiting, diarrhea  or change in bowel or urinary habits, change in stools or urine, dysuria,hematuria,  rash, arthralgias, visual complaints, headache, numbness weakness or ataxia or problems with walking or coordination,  change in mood/affect or memory.                     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 Neck without JVD/Nodes/TM  Lungs clear to A and P bilaterally without cough on insp or exp maneuvers  RRR no s3 or murmur or increase in P2   LLE  pulses slt reduced vs R , no ededma  Abd soft , no tenderness, BS+ No bruits or organomegaly  EXT no edema,  , nl  pulses     Labs ordered today /  reviewed include:   Recent Labs Lab 05/30/14 1012  NA 138  K 4.0  CL 100  CO2 32  BUN 21  CREATININE 1.65*  GLUCOSE 102*    Recent Labs Lab 05/30/14 1012  HGB 12.7*  HCT 37.2*  WBC 8.1  PLT 211.0     Lab Results  Component Value Date   TSH 2.63 05/30/2014     Lab Results  Component Value Date   PROBNP 132.0* 03/28/2014     Lab Results  Component Value Date   ESRSEDRATE 63* 05/30/2014              Subjective:    Patient ID: Isaac [name], male    DOB: January 09, 1941   MRN: [id number]    Brief patient profile:  55  yowm with h/o IHD/HBP/hyperlipidemia followed in pulmonary clinic for Primary care.   History of Present Illness   06/30/2011 f/u ov/[name] cc mostly dry cough x one month then new onset LLQ abd pain daily not present sleeping but daily waxes and wanes sev hours at a time no obvious trigger with activity or meals/flatus/ bms rec Prednisone 10 mg take  4 each am x 2 days,   2 each am x 2 days,  1 each am x2days and stop For cough use delsym cough syrup and stop fish oil until no longer coughing at all Try prilosec 20mg   Take 30-60 min before first meal of the day and Pepcid 20 mg one bedtime .  GERD diet  07/14/2011 f/u ov/[name] cough gone but  cc persistent x one month LLQ pain  Rad across ant abd to naval area plus difficulty with urination x 5 days  But no flank pain, hematuria, dysuria or use of otcs/antihistamine.  Abd pain waxes and wanes worse for maybe an hour then better but always present a little and not worse coughing. No pattern of pain in terms of alleviating or exacerbating.  >>CT abd pelvis neg > referred to [facility]/ [name]> corrected with L Inguinal sugery 12/2011     05/21/2012 f/u ov/[name] for f/u hbp cc fell off ladder x one month, better on nsaids still working remodeling, no cp or sob tia or claudication symptoms rec No change rx   August 05 2012 R shoulder surgery outpt  09/06/2012 f/u  ov/[name]  comprehensive yearly eval for multiple med problems Chief Complaint  Patient presents with  . Annual Exam    Had fall x approx 5 months ago- pt c/o right shoulder pain.    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 Follow-up    Pt states doing well and denies any co's today.  Urinates q 2 h with decreased fos but bp low on norvasc and cardura 1 mg qhs.  No dysuria, back pain, fever Stop norvasc( amlodipine)  Increase cardura to 4 mg at bedtime  Ok to take one half if light headed standing   03/07/2013 f/u ov/[name] re: hpb /urinary hesitancy Chief Complaint  Patient presents with  . Follow-up    Breathing is unchanged since last OV.  Flu shot today   Since increased cardura > Having more urinary urgency / ? Excess fluid but stream is def better rec Increased the cardura (doxazosin) to 4 mg twice daily and call if urinary frequency does not improve  Reduce fluid intake and only drink when thirsty   11/08/2013 acut ov/[name] re: new sob Chief Complaint  Patient presents with  . Follow-up    Pt c/o increased DOE for the past month- gets SOB walking to the mailbox and back.   indolent onset x 6 wks doe,  Sometimes orthopnea, rare though.  Progressed to where  Consistently 50 ft sob Only new problem is cold feet rec Refer to Dr Lovena Le    03/28/2014 Follow up and Med Review (HTN/AR/CAD/CHF-D)  Patient returns for a two-month follow-up Since last visit. He was admitted to the hospital in October for congestive heart failure decompensation.  His condition improved with diuresis. His ACE inhibitor was discontinued due to renal insufficiency He needs follow-up labs today Patient says that he has been feeling  improved with decreased shortness of breath and leg swelling. He is now on Lasix 40 mg daily with the option of using an extra Lasix daily if needed for weight gain, or leg swelling Patient says that he weighs daily at home. He says his dry weight at home have been averaging 177-180 Patient denies any chest pain, orthopnea, PND, or increased leg swelling. Patient's Pneumovax is up-to-date. However, has never had a Prevnar vaccine rec Follow med calendar closely and bring to each visit .  Prevnar vaccine today .  Labs today  Low salt and cholesterol diet.    05/30/2014 f/u ov/[name] re: multiple complaints Chief Complaint  Patient presents with  . Follow-up    pt c/o sob, cough wheezing and has white to lt. green mucus x 1 week. Pt denies chest tightness. Pt is having increased back pain and was told not to take aleve.   chronic / recurrent pain between shoulder blades happens every time with ex and better at rest.  Was better while on aleve as was lower back pain   Feels like he's getting over a chest cold now - was worse last week  rec For arthritis pain try sulindac 200 one twice daily  With meals as needed Increase metaprolol 50 mg to one and a half each am until you see Dr Lovena Le again and let him  adjust it further at that point    10/27/2014 f/u ov/[name] re: hbp/ chronic cough / cp / djd Chief Complaint  Patient presents with  . 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     Note last note by Dr Lovena Le 03/28/14:  back pain with exertion which is his anginal equivalent. He will continue his current medications. I've encouraged the patient to take a nitroglycerin. He is also encouraged to continue exercising daily. No change in medications at this point. rec Change  the metaprolol to where you take one pill in am and a half in pm as per your med calendar See calendar for specific medication instructions    05/22/2015  f/u ov/[name] re: angina pectoris/ CRI/ chronic rhinitis  Chief Complaint  Patient presents with  . Follow-up    Pt c/o cough- prod with light green sputum x 1 month. His right ear is "stopped up" today.   cough worse in am's productive of slight green mucus one month assoc with nasal congestion  Able to walk park most days with no increase back pain (ap equivalent)  Uses clinoril daily / denies other nsaids   No obvious day to day or daytime variabilty or assoc sob/   chest tightness, overt   hb symptoms. No unusual exp hx or h/o childhood pna/ asthma or knowledge of premature birth.  Sleeping ok without nocturnal  or early am exacerbation  of respiratory  c/o's or need for noct saba. Also denies any obvious fluctuation of symptoms with weather or environmental changes or other aggravating or alleviating factors except as outlined above   Current Medications, Allergies, Complete Past Medical History, Past Surgical History, Family History, and Social History were reviewed in [employer] record.  ROS  The following are not active complaints unless bolded sore throat, dysphagia, dental problems, itching, sneezing,  nasal congestion or excess/ purulent secretions, ear ache,   fever, chills, sweats, unintended wt loss, pleuritic   cp, hemoptysis,  orthopnea pnd or leg swelling, presyncope, palpitations, heartburn, abdominal pain, anorexia, nausea, vomiting, diarrhea  or change in bowel or urinary habits, change in stools or urine, dysuria,hematuria,  rash, arthralgias, visual complaints, headache, numbness weakness or ataxia or problems with walking or coordination,  change in mood/affect or memory.            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 Lovena Le (presented  as sudden death) ARTHRALGIA (ICD-719.40)  HYPERTENSION (ICD-401.9)  HYPERLIPIDEMIA (ICD-272.4)  ALLERGIC RHINITIS (ICD-477.9)  L IH repair 1984 GERD/ Barrett's............................................................................[name] [name] LLQ abd pain onset 05/2011...........................................................[name] [name] -  01/23/12 scar tissue removed > pain resolved  HEALTH MAINTENANCE.................................................................Marland KitchenWert  -Td 05/2004,  05/30/2014  -Pneumovax 03/2005 , Prevnar 03/28/2014  -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.........................................Marland KitchenOlin             Objective:   Physical Exam   Ambulatory healthy appearing in no acute distress    wt   192 April 09, 2009 > 190 July 21, 2009 > 187 03/26/2012  > 05/21/2012 187 > 09/06/2012  189 > 12/06/12 192>  189 03/07/2013  > 11/08/2013 191 >  02/10/2014 199 >192 03/28/2014 > 05/30/2014   192 > 10/26/2014  170 > 05/22/2015  168   HEENT: edentulous  nl turbinates, and oropharynx./ ears completely blocked bilaterally with retained wax Neck without JVD/Nodes/TM  Lungs clear to A and P bilaterally without cough on insp or exp maneuvers  RRR no s3 or murmur or increase in P2   LLE  pulses slt reduced vs R , no ededma  Abd soft , no tenderness, BS+ No bruits or organomegaly  EXT no edema,  , nl pulses

## 2015-05-22 NOTE — Assessment & Plan Note (Signed)
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

## 2015-05-22 NOTE — Assessment & Plan Note (Signed)
Adequate control on present rx, reviewed > no change in rx needed   

## 2015-05-22 NOTE — Assessment & Plan Note (Signed)
-   Target LDL < 70 as has IHD

## 2015-05-23 ENCOUNTER — Telehealth: Payer: Self-pay | Admitting: Internal Medicine

## 2015-05-23 DIAGNOSIS — N184 Chronic kidney disease, stage 4 (severe): Secondary | ICD-10-CM

## 2015-05-23 NOTE — Progress Notes (Signed)
Quick Note:  Spoke with pt and notified of results per Dr. [name]. Pt verbalized understanding and denied any questions.  ______ 

## 2015-05-23 NOTE — Telephone Encounter (Signed)
I called and spoke with the pt and notified of lab results/recs  He is aware to stop PPI, lasix, Kdur, Sulindac and avoid NSAIDS  Orders sent to [facility] for urology appt and US Kidneys

## 2015-05-24 ENCOUNTER — Encounter (HOSPITAL_COMMUNITY): Payer: BC Managed Care – PPO

## 2015-05-24 DIAGNOSIS — R3915 Urgency of urination: Secondary | ICD-10-CM | POA: Diagnosis not present

## 2015-05-24 DIAGNOSIS — N189 Chronic kidney disease, unspecified: Secondary | ICD-10-CM | POA: Diagnosis not present

## 2015-05-24 DIAGNOSIS — N401 Enlarged prostate with lower urinary tract symptoms: Secondary | ICD-10-CM | POA: Diagnosis not present

## 2015-05-24 DIAGNOSIS — Z0000 Encounter for general adult medical examination without abnormal findings: Secondary | ICD-10-CM | POA: Diagnosis not present

## 2015-05-24 DIAGNOSIS — R3914 Feeling of incomplete bladder emptying: Secondary | ICD-10-CM | POA: Diagnosis not present

## 2015-06-04 ENCOUNTER — Other Ambulatory Visit: Payer: Self-pay | Admitting: Internal Medicine

## 2015-06-05 ENCOUNTER — Ambulatory Visit (INDEPENDENT_AMBULATORY_CARE_PROVIDER_SITE_OTHER): Payer: Medicare Other | Admitting: Adult Health

## 2015-06-05 ENCOUNTER — Encounter: Payer: Self-pay | Admitting: Adult Health

## 2015-06-05 ENCOUNTER — Other Ambulatory Visit (INDEPENDENT_AMBULATORY_CARE_PROVIDER_SITE_OTHER): Payer: Medicare Other

## 2015-06-05 VITALS — BP 120/70 | HR 63 | Temp 98.7°F | Ht 70.0 in | Wt 174.0 lb

## 2015-06-05 DIAGNOSIS — Z23 Encounter for immunization: Secondary | ICD-10-CM

## 2015-06-05 DIAGNOSIS — N184 Chronic kidney disease, stage 4 (severe): Secondary | ICD-10-CM

## 2015-06-05 DIAGNOSIS — H6120 Impacted cerumen, unspecified ear: Secondary | ICD-10-CM

## 2015-06-05 DIAGNOSIS — R0600 Dyspnea, unspecified: Secondary | ICD-10-CM

## 2015-06-05 DIAGNOSIS — H6123 Impacted cerumen, bilateral: Secondary | ICD-10-CM | POA: Diagnosis not present

## 2015-06-05 DIAGNOSIS — I119 Hypertensive heart disease without heart failure: Secondary | ICD-10-CM

## 2015-06-05 HISTORY — DX: Impacted cerumen, unspecified ear: H61.20

## 2015-06-05 LAB — BASIC METABOLIC PANEL
BUN: 24 mg/dL — AB (ref 6–23)
CALCIUM: 9.1 mg/dL (ref 8.4–10.5)
CO2: 32 mEq/L (ref 19–32)
CREATININE: 1.85 mg/dL — AB (ref 0.40–1.50)
Chloride: 104 mEq/L (ref 96–112)
GFR: 38.12 mL/min — AB (ref >60.00)
GLUCOSE: 102 mg/dL — AB (ref 70–99)
Potassium: 3.2 mEq/L — ABNORMAL LOW (ref 3.5–5.1)
Sodium: 141 mEq/L (ref 135–145)

## 2015-06-05 NOTE — Patient Instructions (Signed)
Labs today  Debrox ear drops as needed Follow up with Renal Doctor as planned next week.  follow up Dr. Melvyn Novas  In 6 weeks and As needed   Flu shot .

## 2015-06-05 NOTE — Assessment & Plan Note (Signed)
Controlled on rx.  No flare off lasix

## 2015-06-05 NOTE — Assessment & Plan Note (Signed)
Cont w/ urology referral . May need nephrology referral if not improving  Remain off lasix for now , recheck bmet today  Avoid nsaids.

## 2015-06-05 NOTE — Progress Notes (Signed)
Subjective:    Patient ID: Isaac [name], male    DOB: [birth year]   MRN: [id number]    Brief patient profile:  51 yowm with h/o IHD/HBP/hyperlipidemia followed here for Primary care.   History of Present Illness   06/30/2011 f/u ov/[name] cc mostly dry cough x one month then new onset LLQ abd pain daily not present sleeping but daily waxes and wanes sev hours at a time no obvious trigger with activity or meals/flatus/ bms rec Prednisone 10 mg take  4 each am x 2 days,   2 each am x 2 days,  1 each am x2days and stop For cough use delsym cough syrup and stop fish oil until no longer coughing at all Try prilosec 20mg   Take 30-60 min before first meal of the day and Pepcid 20 mg one bedtime .  GERD diet  07/14/2011 f/u ov/[name] cough gone but  cc persistent x one month LLQ pain  Rad across ant abd to naval area plus difficulty with urination x 5 days  But no flank pain, hematuria, dysuria or use of otcs/antihistamine.  Abd pain waxes and wanes worse for maybe an hour then better but always present a little and not worse coughing. No pattern of pain in terms of alleviating or exacerbating.  >>CT abd pelvis neg > referred to [facility]/ [name]> corrected with L Inguinal sugery 12/2011     05/21/2012 f/u ov/[name] for f/u hbp cc fell off ladder x one month, better on nsaids still working remodeling, no cp or sob tia or claudication symptoms rec No change rx   August 05 2012 R shoulder surgery outpt  09/06/2012 f/u ov/[name] comprehensive yearly eval for multiple med problems Chief Complaint  Patient presents with  . Annual Exam    Had fall x approx 5 months ago- pt c/o right shoulder pain.    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 Follow-up    Pt states doing well and denies any co's today.  Urinates q 2 h with decreased fos but bp low on norvasc and cardura 1 mg qhs.  No dysuria, back pain, fever Stop norvasc( amlodipine)  Increase cardura to 4 mg at bedtime  Ok to take one half if light headed standing   03/07/2013 f/u ov/[name] re: hpb /urinary hesitancy Chief Complaint  Patient presents with  . Follow-up    Breathing is unchanged since last OV.  Flu shot today   Since increased cardura > Having more urinary urgency / ? Excess fluid but stream is def better rec Increased the cardura (doxazosin) to 4 mg twice daily and call if urinary frequency does not improve  Reduce fluid intake and only drink when thirsty   11/08/2013 acut ov/[name] re: new sob Chief Complaint  Patient presents with  . Follow-up    Pt c/o increased DOE for the past month- gets SOB walking to the mailbox and back.   indolent onset x 6 wks doe,  Sometimes orthopnea, rare though.  Progressed to where  Consistently 50 ft sob Only new problem is cold feet rec Refer to Dr Lovena Le   02/10/2014 f/u ov/[name] re:  Hbp/ chronic rhinitis/ chronic  doe with 2 occulded grafts but good LIMA  Chief Complaint  Patient presents with  . Follow-up    DOE; some chest tightness, some productive cough, mostly non-productive, thinks he may be catching cold  has not tried nitrostat for Sob or pain between shoulder blades with exertion but overall tolerating more activity than prior to Cath  Sore throat x 24 h/ no purulent sputum. Nose stuffy with dry mouth, out of nasonex, did not remember afrin rule. Confused with meds/ instructions  >>no changes   03/28/2014 Follow up and Med Review (HTN/AR/CAD/CHF-D)  Patient returns for a two-month follow-up Since last visit. He was admitted to the hospital in October for congestive heart failure decompensation.  His condition improved with diuresis. His ACE inhibitor was discontinued due to renal insufficiency He  needs follow-up labs today Patient says that he has been feeling improved with decreased shortness of breath and leg swelling. He is now on Lasix 40 mg daily with the option of using an extra Lasix daily if needed for weight gain, or leg swelling Patient says that he weighs daily at home. He says his dry weight at home have been averaging 177-180 Patient denies any chest pain, orthopnea, PND, or increased leg swelling. Patient's Pneumovax is up-to-date. However, has never had a Prevnar vaccine rec Follow med calendar closely and bring to each visit .  Prevnar vaccine today .  Labs today  Low salt and cholesterol diet.    05/30/2014 f/u ov/[name] re: multiple complaints Chief Complaint  Patient presents with  . Follow-up    pt c/o sob, cough wheezing and has white to lt. green mucus x 1 week. Pt denies chest tightness. Pt is having increased back pain and was told not to take aleve.   chronic / recurrent pain between shoulder blades happens every time with ex and better at rest.  Was better while on aleve as was lower back pain   Feels like he's getting over a chest cold now - was worse last week   No obvious day to day or daytime variabilty or assoc sob/   cp or chest tightness,   overt sinus or hb symptoms. No unusual exp hx or h/o childhood pna/ asthma or knowledge of premature birth.  Sleeping ok without nocturnal  or early am exacerbation  of respiratory  c/o's or need for noct saba. Also denies any obvious fluctuation of symptoms with weather or environmental changes or other aggravating or alleviating factors except as outlined above   Current Medications, Allergies, Complete Past Medical History, Past Surgical History, Family History, and Social History were reviewed in [employer] record.  ROS  The following are not active complaints unless bolded sore throat, dysphagia, dental problems, itching, sneezing,  nasal congestion or excess/ purulent secretions, ear  ache,   fever, chills, sweats, unintended wt loss, pleuritic or exertional cp, hemoptysis,  orthopnea pnd or leg swelling, presyncope, palpitations, heartburn, abdominal pain, anorexia, nausea, vomiting, diarrhea  or change in bowel or urinary habits, change in stools or urine, dysuria,hematuria,  rash, arthralgias, visual complaints, headache, numbness weakness or ataxia or problems with walking or coordination,  change in mood/affect or memory.                     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 Neck without JVD/Nodes/TM  Lungs clear to A and P bilaterally without cough on insp or exp maneuvers  RRR no s3 or murmur or increase in P2   LLE  pulses slt reduced vs R , no ededma  Abd soft , no tenderness, BS+ No bruits or organomegaly  EXT no edema,  , nl  pulses     Labs ordered today /  reviewed include:   Recent Labs Lab 05/30/14 1012  NA 138  K 4.0  CL 100  CO2 32  BUN 21  CREATININE 1.65*  GLUCOSE 102*    Recent Labs Lab 05/30/14 1012  HGB 12.7*  HCT 37.2*  WBC 8.1  PLT 211.0     Lab Results  Component Value Date   TSH 2.63 05/30/2014     Lab Results  Component Value Date   PROBNP 132.0* 03/28/2014     Lab Results  Component Value Date   ESRSEDRATE 63* 05/30/2014              Subjective:    Patient ID: Isaac [name], male    DOB: January 09, 1941   MRN: [id number]    Brief patient profile:  55  yowm with h/o IHD/HBP/hyperlipidemia followed in pulmonary clinic for Primary care.   History of Present Illness   06/30/2011 f/u ov/[name] cc mostly dry cough x one month then new onset LLQ abd pain daily not present sleeping but daily waxes and wanes sev hours at a time no obvious trigger with activity or meals/flatus/ bms rec Prednisone 10 mg take  4 each am x 2 days,   2 each am x 2 days,  1 each am x2days and stop For cough use delsym cough syrup and stop fish oil until no longer coughing at all Try prilosec 20mg   Take 30-60 min before first meal of the day and Pepcid 20 mg one bedtime .  GERD diet  07/14/2011 f/u ov/[name] cough gone but  cc persistent x one month LLQ pain  Rad across ant abd to naval area plus difficulty with urination x 5 days  But no flank pain, hematuria, dysuria or use of otcs/antihistamine.  Abd pain waxes and wanes worse for maybe an hour then better but always present a little and not worse coughing. No pattern of pain in terms of alleviating or exacerbating.  >>CT abd pelvis neg > referred to [facility]/ [name]> corrected with L Inguinal sugery 12/2011     05/21/2012 f/u ov/[name] for f/u hbp cc fell off ladder x one month, better on nsaids still working remodeling, no cp or sob tia or claudication symptoms rec No change rx   August 05 2012 R shoulder surgery outpt  09/06/2012 f/u  ov/[name]  comprehensive yearly eval for multiple med problems Chief Complaint  Patient presents with  . Annual Exam    Had fall x approx 5 months ago- pt c/o right shoulder pain.    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 Follow-up    Pt states doing well and denies any co's today.  Urinates q 2 h with decreased fos but bp low on norvasc and cardura 1 mg qhs.  No dysuria, back pain, fever Stop norvasc( amlodipine)  Increase cardura to 4 mg at bedtime  Ok to take one half if light headed standing   03/07/2013 f/u ov/[name] re: hpb /urinary hesitancy Chief Complaint  Patient presents with  . Follow-up    Breathing is unchanged since last OV.  Flu shot today   Since increased cardura > Having more urinary urgency / ? Excess fluid but stream is def better rec Increased the cardura (doxazosin) to 4 mg twice daily and call if urinary frequency does not improve  Reduce fluid intake and only drink when thirsty   11/08/2013 acut ov/[name] re: new sob Chief Complaint  Patient presents with  . Follow-up    Pt c/o increased DOE for the past month- gets SOB walking to the mailbox and back.   indolent onset x 6 wks doe,  Sometimes orthopnea, rare though.  Progressed to where  Consistently 50 ft sob Only new problem is cold feet rec Refer to Dr Lovena Le    03/28/2014 Follow up and Med Review (HTN/AR/CAD/CHF-D)  Patient returns for a two-month follow-up Since last visit. He was admitted to the hospital in October for congestive heart failure decompensation.  His condition improved with diuresis. His ACE inhibitor was discontinued due to renal insufficiency He needs follow-up labs today Patient says that he has been feeling  improved with decreased shortness of breath and leg swelling. He is now on Lasix 40 mg daily with the option of using an extra Lasix daily if needed for weight gain, or leg swelling Patient says that he weighs daily at home. He says his dry weight at home have been averaging 177-180 Patient denies any chest pain, orthopnea, PND, or increased leg swelling. Patient's Pneumovax is up-to-date. However, has never had a Prevnar vaccine rec Follow med calendar closely and bring to each visit .  Prevnar vaccine today .  Labs today  Low salt and cholesterol diet.    05/30/2014 f/u ov/[name] re: multiple complaints Chief Complaint  Patient presents with  . Follow-up    pt c/o sob, cough wheezing and has white to lt. green mucus x 1 week. Pt denies chest tightness. Pt is having increased back pain and was told not to take aleve.   chronic / recurrent pain between shoulder blades happens every time with ex and better at rest.  Was better while on aleve as was lower back pain   Feels like he's getting over a chest cold now - was worse last week  rec For arthritis pain try sulindac 200 one twice daily  With meals as needed Increase metaprolol 50 mg to one and a half each am until you see Dr Lovena Le again and let him  adjust it further at that point    10/27/2014 f/u ov/[name] re: hbp/ chronic cough / cp / djd Chief Complaint  Patient presents with  . 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     Note last note by Dr Lovena Le 03/28/14:  back pain with exertion which is his anginal equivalent. He will continue his current medications. I've encouraged the patient to take a nitroglycerin. He is also encouraged to continue exercising daily. No change in medications at this point. rec Change  the metaprolol to where you take one pill in am and a half in pm as per your med calendar See calendar for specific medication instructions    05/22/2015  f/u ov/[name] re: angina pectoris/ CRI/ chronic rhinitis  Chief Complaint  Patient presents with  . Follow-up    Pt c/o cough- prod with light green sputum x 1 month. His right ear is "stopped up" today.   cough worse in am's productive of slight green mucus one month assoc with nasal congestion  Able to walk park most days with no increase back pain (ap equivalent)  Uses clinoril daily / denies other nsaids  >d/c lasix and NSAID  06/05/2015 Follow up : CRI  Pt returns for 2 week follow up . Last ov with bilateral cerumen impaction.  Returns for ear wash today . Ears feel stopped. Has been using otc wax drops.  No pain or drainage.   Labs last ov with worsening scr . He had urinary urgency and frequency. Was referred to urology and  Lasix and NSAID was stopped. Says he has a urology procedure next week planned.   Had sinus infection last ov, tx augmentin. Says he is feeling better with less cough and congestion.  Requests flu shot today .   Denies chest pain, orthopnea , increased edema or sob.      Current Medications, Allergies, Complete Past Medical History, Past Surgical History, Family History, and Social History were reviewed in [employer] record.  ROS  The following are not active complaints unless bolded sore throat, dysphagia, dental problems, itching, sneezing,   , ear ache,   fever, chills, sweats, unintended wt loss, pleuritic   cp, hemoptysis,  orthopnea pnd or leg swelling, presyncope, palpitations, heartburn, abdominal pain, anorexia, nausea, vomiting, diarrhea  or change in bowel or urinary habits, change in stools or urine, dysuria,hematuria,  rash, arthralgias, visual complaints, headache, numbness weakness or ataxia or problems with walking or coordination,  change in mood/affect or memory.             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.........................................Marland KitchenOlin             Objective:   Physical Exam   Ambulatory healthy appearing in no acute distress    wt   192 April 09, 2009 > 190 July 21, 2009 > 187 03/26/2012  > 05/21/2012 187 > 09/06/2012  189 > 12/06/12 192>  189 03/07/2013  > 11/08/2013 191 >  02/10/2014 199 >192 03/28/2014 > 05/30/2014   192 > 10/26/2014    170 > 05/22/2015  168 >174 06/05/2015  Filed Vitals:   06/05/15 1107  BP: 120/70  Pulse: 63  Temp: 98.7 F (37.1 C)  TempSrc: Oral  Height: 5\' 10"  (1.778 m)  Weight: 174 lb (78.926 kg)  SpO2: 99%    HEENT: edentulous  nl turbinates, and oropharynx./ partial cerumen impaction  Neck without JVD/Nodes/TM  Lungs clear to A and P bilaterally without cough on insp or exp maneuvers  RRR no s3 or murmur or increase in P2   LLE  pulses slt reduced vs R , no ededma  Abd soft , no tenderness, BS+ No bruits or organomegaly  EXT no edema,  , nl pulses

## 2015-06-05 NOTE — Assessment & Plan Note (Signed)
Bilateral cerumen impaction resolved with ear irritgation, tolerated well  Advised to use debrox As needed

## 2015-06-05 NOTE — Progress Notes (Signed)
Quick Note:  LVM for patient to return call. ______ 

## 2015-06-06 ENCOUNTER — Telehealth: Payer: Self-pay | Admitting: Adult Health

## 2015-06-06 NOTE — Progress Notes (Signed)
Chart and office note reviewed in detail  > agree with a/p as outlined    

## 2015-06-06 NOTE — Telephone Encounter (Signed)
Kidney function is improved     K+ is low , restart KDUR 35meq daily .     Only use lasix 40mg  1/2 tab daily. As needed Swelling.     Do not use any NSAIDS     Cont w/ follow up with Urology as planned    .    Spoke with pt and notified of results per TP. Pt verbalized understanding and denied any questions.

## 2015-06-11 DIAGNOSIS — N401 Enlarged prostate with lower urinary tract symptoms: Secondary | ICD-10-CM | POA: Diagnosis not present

## 2015-06-11 DIAGNOSIS — R3914 Feeling of incomplete bladder emptying: Secondary | ICD-10-CM | POA: Diagnosis not present

## 2015-06-11 DIAGNOSIS — R3915 Urgency of urination: Secondary | ICD-10-CM | POA: Diagnosis not present

## 2015-06-11 DIAGNOSIS — Z0000 Encounter for general adult medical examination without abnormal findings: Secondary | ICD-10-CM | POA: Diagnosis not present

## 2015-07-10 DIAGNOSIS — R3914 Feeling of incomplete bladder emptying: Secondary | ICD-10-CM | POA: Diagnosis not present

## 2015-07-10 DIAGNOSIS — R3915 Urgency of urination: Secondary | ICD-10-CM | POA: Diagnosis not present

## 2015-07-10 DIAGNOSIS — Z0000 Encounter for general adult medical examination without abnormal findings: Secondary | ICD-10-CM | POA: Diagnosis not present

## 2015-07-10 DIAGNOSIS — N401 Enlarged prostate with lower urinary tract symptoms: Secondary | ICD-10-CM | POA: Diagnosis not present

## 2015-07-23 ENCOUNTER — Ambulatory Visit: Payer: Medicare Other | Admitting: Internal Medicine

## 2015-07-23 DIAGNOSIS — R3915 Urgency of urination: Secondary | ICD-10-CM | POA: Diagnosis not present

## 2015-07-23 DIAGNOSIS — Z0000 Encounter for general adult medical examination without abnormal findings: Secondary | ICD-10-CM | POA: Diagnosis not present

## 2015-07-23 DIAGNOSIS — R3914 Feeling of incomplete bladder emptying: Secondary | ICD-10-CM | POA: Diagnosis not present

## 2015-08-08 DIAGNOSIS — Z0000 Encounter for general adult medical examination without abnormal findings: Secondary | ICD-10-CM | POA: Diagnosis not present

## 2015-08-08 DIAGNOSIS — N3281 Overactive bladder: Secondary | ICD-10-CM | POA: Diagnosis not present

## 2015-08-08 DIAGNOSIS — R3915 Urgency of urination: Secondary | ICD-10-CM | POA: Diagnosis not present

## 2015-08-08 DIAGNOSIS — R3914 Feeling of incomplete bladder emptying: Secondary | ICD-10-CM | POA: Diagnosis not present

## 2015-08-08 DIAGNOSIS — N401 Enlarged prostate with lower urinary tract symptoms: Secondary | ICD-10-CM | POA: Diagnosis not present

## 2015-09-04 DIAGNOSIS — N3281 Overactive bladder: Secondary | ICD-10-CM | POA: Diagnosis not present

## 2015-09-04 DIAGNOSIS — R3915 Urgency of urination: Secondary | ICD-10-CM | POA: Diagnosis not present

## 2015-09-04 DIAGNOSIS — Z0000 Encounter for general adult medical examination without abnormal findings: Secondary | ICD-10-CM | POA: Diagnosis not present

## 2015-10-09 DIAGNOSIS — R3915 Urgency of urination: Secondary | ICD-10-CM | POA: Diagnosis not present

## 2015-10-09 DIAGNOSIS — N401 Enlarged prostate with lower urinary tract symptoms: Secondary | ICD-10-CM | POA: Diagnosis not present

## 2015-10-10 ENCOUNTER — Encounter: Payer: Self-pay | Admitting: Adult Health

## 2015-10-10 ENCOUNTER — Ambulatory Visit (INDEPENDENT_AMBULATORY_CARE_PROVIDER_SITE_OTHER): Payer: Medicare Other | Admitting: Adult Health

## 2015-10-10 VITALS — BP 146/80 | HR 67 | Temp 97.7°F | Ht 70.0 in | Wt 170.0 lb

## 2015-10-10 DIAGNOSIS — I10 Essential (primary) hypertension: Secondary | ICD-10-CM

## 2015-10-10 DIAGNOSIS — E785 Hyperlipidemia, unspecified: Secondary | ICD-10-CM

## 2015-10-10 DIAGNOSIS — N184 Chronic kidney disease, stage 4 (severe): Secondary | ICD-10-CM | POA: Diagnosis not present

## 2015-10-10 MED ORDER — NITROGLYCERIN 0.4 MG SL SUBL: 0.4000 mg | SUBLINGUAL_TABLET | SUBLINGUAL | Status: DC | PRN

## 2015-10-10 MED ORDER — SIMVASTATIN 20 MG PO TABS: 20.0000 mg | ORAL_TABLET | Freq: Every day | ORAL | Status: DC

## 2015-10-10 MED ORDER — DOXAZOSIN MESYLATE 4 MG PO TABS: ORAL_TABLET | ORAL | Status: DC

## 2015-10-10 NOTE — Progress Notes (Signed)
Subjective:    Patient ID: Isaac [name], male    DOB: [birth year]   MRN: [id number]    Brief patient profile:  51 yowm with h/o IHD/HBP/hyperlipidemia followed here for Primary care.   History of Present Illness   06/30/2011 f/u ov/[name] cc mostly dry cough x one month then new onset LLQ abd pain daily not present sleeping but daily waxes and wanes sev hours at a time no obvious trigger with activity or meals/flatus/ bms rec Prednisone 10 mg take  4 each am x 2 days,   2 each am x 2 days,  1 each am x2days and stop For cough use delsym cough syrup and stop fish oil until no longer coughing at all Try prilosec 20mg   Take 30-60 min before first meal of the day and Pepcid 20 mg one bedtime .  GERD diet  07/14/2011 f/u ov/[name] cough gone but  cc persistent x one month LLQ pain  Rad across ant abd to naval area plus difficulty with urination x 5 days  But no flank pain, hematuria, dysuria or use of otcs/antihistamine.  Abd pain waxes and wanes worse for maybe an hour then better but always present a little and not worse coughing. No pattern of pain in terms of alleviating or exacerbating.  >>CT abd pelvis neg > referred to [facility]/ [name]> corrected with L Inguinal sugery 12/2011     05/21/2012 f/u ov/[name] for f/u hbp cc fell off ladder x one month, better on nsaids still working remodeling, no cp or sob tia or claudication symptoms rec No change rx   August 05 2012 R shoulder surgery outpt  09/06/2012 f/u ov/[name] comprehensive yearly eval for multiple med problems Chief Complaint  Patient presents with  . Annual Exam    Had fall x approx 5 months ago- pt c/o right shoulder pain.    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 Follow-up    Pt states doing well and denies any co's today.  Urinates q 2 h with decreased fos but bp low on norvasc and cardura 1 mg qhs.  No dysuria, back pain, fever Stop norvasc( amlodipine)  Increase cardura to 4 mg at bedtime  Ok to take one half if light headed standing   03/07/2013 f/u ov/[name] re: hpb /urinary hesitancy Chief Complaint  Patient presents with  . Follow-up    Breathing is unchanged since last OV.  Flu shot today   Since increased cardura > Having more urinary urgency / ? Excess fluid but stream is def better rec Increased the cardura (doxazosin) to 4 mg twice daily and call if urinary frequency does not improve  Reduce fluid intake and only drink when thirsty   11/08/2013 acut ov/[name] re: new sob Chief Complaint  Patient presents with  . Follow-up    Pt c/o increased DOE for the past month- gets SOB walking to the mailbox and back.   indolent onset x 6 wks doe,  Sometimes orthopnea, rare though.  Progressed to where  Consistently 50 ft sob Only new problem is cold feet rec Refer to Dr Lovena Le   02/10/2014 f/u ov/[name] re:  Hbp/ chronic rhinitis/ chronic  doe with 2 occulded grafts but good LIMA  Chief Complaint  Patient presents with  . Follow-up    DOE; some chest tightness, some productive cough, mostly non-productive, thinks he may be catching cold  has not tried nitrostat for Sob or pain between shoulder blades with exertion but overall tolerating more activity than prior to Cath  Sore throat x 24 h/ no purulent sputum. Nose stuffy with dry mouth, out of nasonex, did not remember afrin rule. Confused with meds/ instructions  >>no changes   03/28/2014 Follow up and Med Review (HTN/AR/CAD/CHF-D)  Patient returns for a two-month follow-up Since last visit. He was admitted to the hospital in October for congestive heart failure decompensation.  His condition improved with diuresis. His ACE inhibitor was discontinued due to renal insufficiency He  needs follow-up labs today Patient says that he has been feeling improved with decreased shortness of breath and leg swelling. He is now on Lasix 40 mg daily with the option of using an extra Lasix daily if needed for weight gain, or leg swelling Patient says that he weighs daily at home. He says his dry weight at home have been averaging 177-180 Patient denies any chest pain, orthopnea, PND, or increased leg swelling. Patient's Pneumovax is up-to-date. However, has never had a Prevnar vaccine rec Follow med calendar closely and bring to each visit .  Prevnar vaccine today .  Labs today  Low salt and cholesterol diet.    05/30/2014 f/u ov/[name] re: multiple complaints Chief Complaint  Patient presents with  . Follow-up    pt c/o sob, cough wheezing and has white to lt. green mucus x 1 week. Pt denies chest tightness. Pt is having increased back pain and was told not to take aleve.   chronic / recurrent pain between shoulder blades happens every time with ex and better at rest.  Was better while on aleve as was lower back pain   Feels like he's getting over a chest cold now - was worse last week   No obvious day to day or daytime variabilty or assoc sob/   cp or chest tightness,   overt sinus or hb symptoms. No unusual exp hx or h/o childhood pna/ asthma or knowledge of premature birth.  Sleeping ok without nocturnal  or early am exacerbation  of respiratory  c/o's or need for noct saba. Also denies any obvious fluctuation of symptoms with weather or environmental changes or other aggravating or alleviating factors except as outlined above   Current Medications, Allergies, Complete Past Medical History, Past Surgical History, Family History, and Social History were reviewed in [employer] record.  ROS  The following are not active complaints unless bolded sore throat, dysphagia, dental problems, itching, sneezing,  nasal congestion or excess/ purulent secretions, ear  ache,   fever, chills, sweats, unintended wt loss, pleuritic or exertional cp, hemoptysis,  orthopnea pnd or leg swelling, presyncope, palpitations, heartburn, abdominal pain, anorexia, nausea, vomiting, diarrhea  or change in bowel or urinary habits, change in stools or urine, dysuria,hematuria,  rash, arthralgias, visual complaints, headache, numbness weakness or ataxia or problems with walking or coordination,  change in mood/affect or memory.                     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 Neck without JVD/Nodes/TM  Lungs clear to A and P bilaterally without cough on insp or exp maneuvers  RRR no s3 or murmur or increase in P2   LLE  pulses slt reduced vs R , no ededma  Abd soft , no tenderness, BS+ No bruits or organomegaly  EXT no edema,  , nl  pulses     Labs ordered today /  reviewed include:   Recent Labs Lab 05/30/14 1012  NA 138  K 4.0  CL 100  CO2 32  BUN 21  CREATININE 1.65*  GLUCOSE 102*    Recent Labs Lab 05/30/14 1012  HGB 12.7*  HCT 37.2*  WBC 8.1  PLT 211.0     Lab Results  Component Value Date   TSH 2.63 05/30/2014     Lab Results  Component Value Date   PROBNP 132.0* 03/28/2014     Lab Results  Component Value Date   ESRSEDRATE 63* 05/30/2014              Subjective:    Patient ID: MACKAY TEUFEL, male    DOB: January 09, 1941   MRN: [id number]    Brief patient profile:  55  yowm with h/o IHD/HBP/hyperlipidemia followed in pulmonary clinic for Primary care.   History of Present Illness   06/30/2011 f/u ov/[name] cc mostly dry cough x one month then new onset LLQ abd pain daily not present sleeping but daily waxes and wanes sev hours at a time no obvious trigger with activity or meals/flatus/ bms rec Prednisone 10 mg take  4 each am x 2 days,   2 each am x 2 days,  1 each am x2days and stop For cough use delsym cough syrup and stop fish oil until no longer coughing at all Try prilosec 20mg   Take 30-60 min before first meal of the day and Pepcid 20 mg one bedtime .  GERD diet  07/14/2011 f/u ov/[name] cough gone but  cc persistent x one month LLQ pain  Rad across ant abd to naval area plus difficulty with urination x 5 days  But no flank pain, hematuria, dysuria or use of otcs/antihistamine.  Abd pain waxes and wanes worse for maybe an hour then better but always present a little and not worse coughing. No pattern of pain in terms of alleviating or exacerbating.  >>CT abd pelvis neg > referred to [facility]/ [name]> corrected with L Inguinal sugery 12/2011     05/21/2012 f/u ov/[name] for f/u hbp cc fell off ladder x one month, better on nsaids still working remodeling, no cp or sob tia or claudication symptoms rec No change rx   August 05 2012 R shoulder surgery outpt  09/06/2012 f/u  ov/[name]  comprehensive yearly eval for multiple med problems Chief Complaint  Patient presents with  . Annual Exam    Had fall x approx 5 months ago- pt c/o right shoulder pain.    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 Follow-up    Pt states doing well and denies any co's today.  Urinates q 2 h with decreased fos but bp low on norvasc and cardura 1 mg qhs.  No dysuria, back pain, fever Stop norvasc( amlodipine)  Increase cardura to 4 mg at bedtime  Ok to take one half if light headed standing   03/07/2013 f/u ov/[name] re: hpb /urinary hesitancy Chief Complaint  Patient presents with  . Follow-up    Breathing is unchanged since last OV.  Flu shot today   Since increased cardura > Having more urinary urgency / ? Excess fluid but stream is def better rec Increased the cardura (doxazosin) to 4 mg twice daily and call if urinary frequency does not improve  Reduce fluid intake and only drink when thirsty   11/08/2013 acut ov/[name] re: new sob Chief Complaint  Patient presents with  . Follow-up    Pt c/o increased DOE for the past month- gets SOB walking to the mailbox and back.   indolent onset x 6 wks doe,  Sometimes orthopnea, rare though.  Progressed to where  Consistently 50 ft sob Only new problem is cold feet rec Refer to Dr Lovena Le    03/28/2014 Follow up and Med Review (HTN/AR/CAD/CHF-D)  Patient returns for a two-month follow-up Since last visit. He was admitted to the hospital in October for congestive heart failure decompensation.  His condition improved with diuresis. His ACE inhibitor was discontinued due to renal insufficiency He needs follow-up labs today Patient says that he has been feeling  improved with decreased shortness of breath and leg swelling. He is now on Lasix 40 mg daily with the option of using an extra Lasix daily if needed for weight gain, or leg swelling Patient says that he weighs daily at home. He says his dry weight at home have been averaging 177-180 Patient denies any chest pain, orthopnea, PND, or increased leg swelling. Patient's Pneumovax is up-to-date. However, has never had a Prevnar vaccine rec Follow med calendar closely and bring to each visit .  Prevnar vaccine today .  Labs today  Low salt and cholesterol diet.    05/30/2014 f/u ov/[name] re: multiple complaints Chief Complaint  Patient presents with  . Follow-up    pt c/o sob, cough wheezing and has white to lt. green mucus x 1 week. Pt denies chest tightness. Pt is having increased back pain and was told not to take aleve.   chronic / recurrent pain between shoulder blades happens every time with ex and better at rest.  Was better while on aleve as was lower back pain   Feels like he's getting over a chest cold now - was worse last week  rec For arthritis pain try sulindac 200 one twice daily  With meals as needed Increase metaprolol 50 mg to one and a half each am until you see Dr Lovena Le again and let him  adjust it further at that point    10/27/2014 f/u ov/[name] re: hbp/ chronic cough / cp / djd Chief Complaint  Patient presents with  . 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     Note last note by Dr Lovena Le 03/28/14:  back pain with exertion which is his anginal equivalent. He will continue his current medications. I've encouraged the patient to take a nitroglycerin. He is also encouraged to continue exercising daily. No change in medications at this point. rec Change  the metaprolol to where you take one pill in am and a half in pm as per your med calendar See calendar for specific medication instructions    05/22/2015  f/u ov/[name] re: angina pectoris/ CRI/ chronic rhinitis  Chief Complaint  Patient presents with  . Follow-up    Pt c/o cough- prod with light green sputum x 1 month. His right ear is "stopped up" today.   cough worse in am's productive of slight green mucus one month assoc with nasal congestion  Able to walk park most days with no increase back pain (ap equivalent)  Uses clinoril daily / denies other nsaids  >d/c lasix and NSAID  06/05/2015 Follow up : CRI  Pt returns for 2 week follow up . Last ov with bilateral cerumen impaction.  Returns for ear wash today . Ears feel stopped. Has been using otc wax drops.  No pain or drainage.   Labs last ov with worsening scr . He had urinary urgency and frequency. Was referred to urology and  Lasix and NSAID was stopped. Says he has a urology procedure next week planned.   Had sinus infection last ov, tx augmentin. Says he is feeling better with less cough and congestion.  Requests flu shot today .  >>no changes.     10/10/2015 Followup : HTN, Hyperlipidemia  Returns for 4 month follow up .   Pt states breathing is doing well and has no new complaints at this time.  Walks 1 mile daily. No chest pain.  Feels good. Needs refills.  Started on Mybertriq per urology, seems to be helping his urinary frequency.  PVX and Prevnar utd.  No fever, orthopnea or edema.    Current Medications, Allergies, Complete Past Medical History, Past Surgical History, Family History, and Social History were reviewed in [employer] record.  ROS  The following are not active complaints unless bolded sore throat, dysphagia, dental problems, itching, sneezing,   , ear ache,   fever, chills, sweats, unintended wt loss, pleuritic   cp, hemoptysis,  orthopnea pnd or leg swelling, presyncope,  palpitations, heartburn, abdominal pain, anorexia, nausea, vomiting, diarrhea  or change in bowel or urinary habits, change in stools or urine, dysuria,hematuria,  rash, arthralgias, visual complaints, headache, numbness weakness or ataxia or problems with walking or coordination,  change in mood/affect or memory.            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.........................................Marland KitchenOlin             Objective:   Physical Exam   Ambulatory healthy appearing in no acute distress    wt   192 April 09, 2009 > 190 July 21, 2009 > 187 03/26/2012  > 05/21/2012 187 > 09/06/2012  189 > 12/06/12 192>  189 03/07/2013  > 11/08/2013 191 >  02/10/2014 199 >192 03/28/2014 > 05/30/2014   192 > 10/26/2014    170 > 05/22/2015  168 >174 06/05/2015 . Filed Vitals:   10/10/15 1142  BP: 146/80  Pulse: 67  Temp: 97.7 F (36.5 C)  TempSrc: Oral  Height: 5\' 10"  (D34-534 m)  Weight: 170 lb (77.111 kg)  SpO2: 100%      HEENT: edentulous  nl turbinates, and oropharynx./ partial cerumen impaction  Neck without JVD/Nodes/TM  Lungs clear to A and P bilaterally without cough on insp or exp maneuvers  RRR no s3 or murmur or increase in P2   LLE  pulses slt reduced vs R , no ededma  Abd soft , no tenderness, BS+ No bruits or organomegaly   EXT no edema,  , nl pulses                Rc Amison NP-C  [facility]  10/10/2015

## 2015-10-10 NOTE — Assessment & Plan Note (Signed)
Controlled on rx   

## 2015-10-10 NOTE — Addendum Note (Signed)
Addended by: Clayborne Dana C on: 10/10/2015 12:16 PM   Modules accepted: Orders

## 2015-10-10 NOTE — Assessment & Plan Note (Signed)
Cont on current regimen  Low fat cholesterol diet.

## 2015-10-10 NOTE — Patient Instructions (Signed)
Continue on current regimen .  Follow up Dr. [name]  In 4 months and As needed   

## 2015-10-10 NOTE — Assessment & Plan Note (Signed)
Stable   Cont to follow  

## 2015-10-10 NOTE — Progress Notes (Signed)
Chart and office note reviewed in detail  > agree with a/p as outlined    

## 2015-10-23 DIAGNOSIS — H40033 Anatomical narrow angle, bilateral: Secondary | ICD-10-CM | POA: Diagnosis not present

## 2015-10-23 DIAGNOSIS — H2513 Age-related nuclear cataract, bilateral: Secondary | ICD-10-CM | POA: Diagnosis not present

## 2015-11-18 ENCOUNTER — Other Ambulatory Visit: Payer: Self-pay | Admitting: Internal Medicine

## 2016-01-09 DIAGNOSIS — R350 Frequency of micturition: Secondary | ICD-10-CM | POA: Diagnosis not present

## 2016-02-11 ENCOUNTER — Other Ambulatory Visit (INDEPENDENT_AMBULATORY_CARE_PROVIDER_SITE_OTHER): Payer: Medicare Other

## 2016-02-11 ENCOUNTER — Encounter: Payer: Self-pay | Admitting: Internal Medicine

## 2016-02-11 ENCOUNTER — Ambulatory Visit (INDEPENDENT_AMBULATORY_CARE_PROVIDER_SITE_OTHER): Payer: Medicare Other | Admitting: Internal Medicine

## 2016-02-11 ENCOUNTER — Ambulatory Visit (INDEPENDENT_AMBULATORY_CARE_PROVIDER_SITE_OTHER)
Admission: RE | Admit: 2016-02-11 | Discharge: 2016-02-11 | Disposition: A | Discharge status: Home / Self Care | Payer: Medicare Other | Source: Ambulatory Visit | Attending: Internal Medicine | Admitting: Internal Medicine

## 2016-02-11 VITALS — BP 160/70 | HR 72 | Ht 70.5 in | Wt 166.0 lb

## 2016-02-11 DIAGNOSIS — R05 Cough: Secondary | ICD-10-CM

## 2016-02-11 DIAGNOSIS — E785 Hyperlipidemia, unspecified: Secondary | ICD-10-CM | POA: Diagnosis not present

## 2016-02-11 DIAGNOSIS — I10 Essential (primary) hypertension: Secondary | ICD-10-CM | POA: Diagnosis not present

## 2016-02-11 DIAGNOSIS — R059 Cough, unspecified: Secondary | ICD-10-CM

## 2016-02-11 DIAGNOSIS — J449 Chronic obstructive pulmonary disease, unspecified: Secondary | ICD-10-CM | POA: Diagnosis not present

## 2016-02-11 DIAGNOSIS — Z23 Encounter for immunization: Secondary | ICD-10-CM | POA: Diagnosis not present

## 2016-02-11 LAB — CBC WITH DIFFERENTIAL/PLATELET
BASOS ABS: 0 10*3/uL (ref 0.0–0.1)
Basophils Relative: 0.4 % (ref 0.0–3.0)
EOS ABS: 0.3 10*3/uL (ref 0.0–0.7)
Eosinophils Relative: 3.2 % (ref 0.0–5.0)
HEMATOCRIT: 39.6 % (ref 39.0–52.0)
Hemoglobin: 13.5 g/dL (ref 13.0–17.0)
LYMPHS PCT: 26.4 % (ref 12.0–46.0)
Lymphs Abs: 2.2 10*3/uL (ref 0.7–4.0)
MCHC: 34.2 g/dL (ref 30.0–36.0)
MCV: 91.5 fl (ref 78.0–100.0)
MONOS PCT: 9.4 % (ref 3.0–12.0)
Monocytes Absolute: 0.8 10*3/uL (ref 0.1–1.0)
NEUTROS ABS: 5.1 10*3/uL (ref 1.4–7.7)
Neutrophils Relative %: 60.6 % (ref 43.0–77.0)
PLATELETS: 153 10*3/uL (ref 150.0–400.0)
RBC: 4.33 Mil/uL (ref 4.22–5.81)
RDW: 13.9 % (ref 11.5–15.5)
WBC: 8.3 10*3/uL (ref 4.0–10.5)

## 2016-02-11 LAB — HEPATIC FUNCTION PANEL
ALT: 9 U/L (ref 0–53)
AST: 13 U/L (ref 0–37)
Albumin: 4.4 g/dL (ref 3.5–5.2)
Alkaline Phosphatase: 60 U/L (ref 39–117)
BILIRUBIN TOTAL: 0.6 mg/dL (ref 0.2–1.2)
Bilirubin, Direct: 0.1 mg/dL (ref 0.0–0.3)
Total Protein: 7.9 g/dL (ref 6.0–8.3)

## 2016-02-11 LAB — LIPID PANEL
CHOLESTEROL: 137 mg/dL (ref 0–200)
HDL: 49.8 mg/dL (ref >39.00)
LDL CALC: 57 mg/dL (ref 0–99)
NonHDL: 87.33
TRIGLYCERIDES: 150 mg/dL — AB (ref 0.0–149.0)
Total CHOL/HDL Ratio: 3
VLDL: 30 mg/dL (ref 0.0–40.0)

## 2016-02-11 LAB — BASIC METABOLIC PANEL
BUN: 19 mg/dL (ref 6–23)
CALCIUM: 9.4 mg/dL (ref 8.4–10.5)
CO2: 29 meq/L (ref 19–32)
CREATININE: 1.47 mg/dL (ref 0.40–1.50)
Chloride: 103 mEq/L (ref 96–112)
GFR: 49.61 mL/min — ABNORMAL LOW (ref >60.00)
GLUCOSE: 102 mg/dL — AB (ref 70–99)
Potassium: 3.7 mEq/L (ref 3.5–5.1)
SODIUM: 142 meq/L (ref 135–145)

## 2016-02-11 LAB — TSH: TSH: 1.58 u[IU]/mL (ref 0.35–4.50)

## 2016-02-11 MED ORDER — NITROGLYCERIN 0.4 MG SL SUBL: 0.4000 mg | SUBLINGUAL_TABLET | SUBLINGUAL | 2 refills | Status: DC | PRN

## 2016-02-11 MED ORDER — AZITHROMYCIN 250 MG PO TABS: ORAL_TABLET | ORAL | 0 refills | Status: DC

## 2016-02-11 MED ORDER — DOXAZOSIN MESYLATE 4 MG PO TABS: ORAL_TABLET | ORAL | 3 refills | Status: DC

## 2016-02-11 NOTE — Progress Notes (Signed)
Subjective:    Patient ID: Isaac [name], male    DOB: Sep 10, 1940   MRN: [id number]    Brief patient profile:  87  yowm with h/o IHD/HBP/hyperlipidemia followed in pulmonary clinic for Primary care.   History of Present Illness   06/30/2011 f/u ov/Isaac [name] cc mostly dry cough x one month then new onset LLQ abd pain daily not present sleeping but daily waxes and wanes sev hours at a time no obvious trigger with activity or meals/flatus/ bms rec Prednisone 10 mg take  4 each am x 2 days,   2 each am x 2 days,  1 each am x2days and stop For cough use delsym cough syrup and stop fish oil until no longer coughing at all Try prilosec 20mg   Take 30-60 min before first meal of the day and Pepcid 20 mg one bedtime .  GERD diet  07/14/2011 f/u ov/Isaac [name] cough gone but  cc persistent x one month LLQ pain  Rad across ant abd to naval area plus difficulty with urination x 5 days  But no flank pain, hematuria, dysuria or use of otcs/antihistamine.  Abd pain waxes and wanes worse for maybe an hour then better but always present a little and not worse coughing. No pattern of pain in terms of alleviating or exacerbating.  >>CT abd pelvis neg > referred to [facility]/ [name]> corrected with L Inguinal sugery 12/2011     05/21/2012 f/u ov/Isaac [name] for f/u hbp cc fell off ladder x one month, better on nsaids still working remodeling, no cp or sob tia or claudication symptoms rec No change rx   August 05 2012 R shoulder surgery outpt   10/27/2014 f/u ov/Isaac [name] re: hbp/ chronic cough / cp / djd Chief Complaint  Patient presents with  . 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     Note last note by Dr Lovena Le 03/28/14:  back pain with exertion which is his anginal equivalent. He will continue his current  medications. I've encouraged the patient to take a nitroglycerin. He is also encouraged to continue exercising daily. No change in medications at this point. 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     10/10/2015 NP Followup : HTN, Hyperlipidemia  Returns for 4 month follow up .  Walks 1 mile daily. No chest pain.  Feels good. Needs refills.  Started on Mybertriq per urology, seems to be helping his urinary frequency.   rec No change rx     02/11/2016  f/u ov/Isaac [name] re: hbp/ hyperlipidemia  Chief Complaint  Patient presents with  . Follow-up    Pt c/o cough x 3 wks- prod with white to green sputum.    cough p stirs/ drinks coffee not tpically nocturnal/ assoc with nasal congestion "like a head cold" but not sob /wheeze   No obvious day to day or daytime variability or assoc  mucus plugs or hemoptysis or cp or chest tightness, subjective wheeze or overt sinus or hb symptoms. No unusual exp hx or h/o childhood pna/ asthma or  knowledge of premature birth.  Sleeping ok without nocturnal  or early am exacerbation  of respiratory  c/o's or need for noct saba. Also denies any obvious fluctuation of symptoms with weather or environmental changes or other aggravating or alleviating factors except as outlined above   Current Medications, Allergies, Complete Past Medical History, Past Surgical History, Family History, and Social History were reviewed in [employer] record.  ROS  The following are not active complaints unless bolded sore throat, dysphagia, dental problems, itching, sneezing,  nasal congestion or excess/ purulent secretions, ear ache,   fever, chills, sweats, unintended wt loss, classically pleuritic or exertional cp,  orthopnea pnd or leg swelling, presyncope, palpitations, abdominal pain,  anorexia, nausea, vomiting, diarrhea  or change in bowel or bladder habits, change in stools or urine, dysuria,hematuria,  rash, arthralgias, visual complaints, headache, numbness, weakness or ataxia or problems with walking or coordination,  change in mood/affect or memory.             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.........................................Marland KitchenOlin             Objective:   Physical Exam   Ambulatory healthy appearing in no acute distress / vital signs reviewed:  Note bp elevation    wt   192 April 09, 2009 > 190 July 21, 2009 > 187 03/26/2012  > 05/21/2012 187 > 09/06/2012  189 > 12/06/12 192>  189 03/07/2013  > 11/08/2013 191 >  02/10/2014 199 >192 03/28/2014 > 05/30/2014   192 > 10/26/2014    170 > 05/22/2015  168 >174 06/05/2015 >  02/11/2016   166         HEENT: edentulous  nl turbinates, and oropharynx./ partial cerumen impaction bilaterally   Neck without JVD/Nodes/TM  Lungs clear to A and P bilaterally without cough on insp or exp maneuvers  RRR no s3 or murmur or increase in P2   LLE  pulses slt reduced vs R , no ededma  Abd soft , no tenderness, BS+ No bruits or organomegaly  EXT no edema,  , nl pulses  MS  Nl gain s deformity or restriction.     CXR PA and Lateral:   02/11/2016 :    I  personally reviewed images and agree with radiology impression as follows:    COPD with hyperinflation. Increased lung markings in the bases may be scarring or atelectasis.  Negative for heart failure.   Labs ordered/ reviewed:      Chemistry      Component Value Date/Time   NA 142 02/11/2016 1056   K 3.7 02/11/2016 1056   CL 103 02/11/2016 1056   CO2 29 02/11/2016 1056   BUN 19 02/11/2016 1056   CREATININE 1.47 02/11/2016 1056   CREATININE 1.33 10/13/2011 1653      Component Value Date/Time   CALCIUM 9.4 02/11/2016 1056   ALKPHOS 60 02/11/2016 1056   AST 13 02/11/2016 1056   ALT 9 02/11/2016 1056   BILITOT 0.6 02/11/2016 1056        Lab Results  Component Value Date   WBC 8.3  02/11/2016   HGB 13.5 02/11/2016   HCT 39.6 02/11/2016   MCV 91.5 02/11/2016   PLT 153.0 02/11/2016       Lab Results  Component Value Date   TSH 1.58 02/11/2016

## 2016-02-11 NOTE — Patient Instructions (Addendum)
Please remember to go to the lab and x-ray department downstairs for your tests - we will call you with the results when they are available.    zpak   For cough > mucinex dm up to 1200 mg every 12 hours as needed  See Tammy NP in 3 months  with all your medications, even over the counter meds, separated in two separate bags, the ones you take no matter what vs the ones you stop once you feel better and take only as needed when you feel you need them.   Tammy  will generate for you a new user friendly medication calendar that will put [place] all on the same page re: your medication use.     Without this process, it simply isn't possible to assure that we are providing  your outpatient care  with  the attention to detail we feel you deserve.   If we cannot assure that you're getting that kind of care,  then we cannot manage your problem effectively from this clinic.  Once you have seen Tammy and we are sure that we're all on the same page with your medication use she will arrange follow up with me.

## 2016-02-12 NOTE — Progress Notes (Signed)
Spoke with pt and notified of results per Dr. [name]. Pt verbalized understanding and denied any questions. 

## 2016-02-18 NOTE — Assessment & Plan Note (Signed)
In setting of uri/ head  Cold with persistent purulent sputum > reec zpak and f/u with augmentin if doesn't clear/ advised

## 2016-02-18 NOTE — Assessment & Plan Note (Signed)
No optimal, ? If really adherent with complex medical regimen  I had an extended discussion with the patient reviewing all relevant studies completed to date and  lasting 15 to 20 minutes of a 25 minute visit on the following ongoing concerns:   To keep things simple, I have asked the patient to first separate medicines that are perceived as maintenance, that is to be taken daily "no matter what", from those medicines that are taken on only on an as-needed basis and I have given the patient examples of both, and then return to see our NP to generate a  detailed  medication calendar which should be followed until the next physician sees the patient and updates it.    In meantime avoid salt, take meds as directed   I had an extended discussion with the patient reviewing all relevant studies completed to date and  lasting 15 to 20 minutes of a 25 minute visit    Each maintenance medication was reviewed in detail including most importantly the difference between maintenance and prns and under what circumstances the prns are to be triggered using an action plan format that is not reflected in the computer generated alphabetically organized AVS.    Please see instructions for details which were reviewed in writing and the patient given a copy highlighting the part that I personally wrote and discussed at today's ov.

## 2016-02-18 NOTE — Assessment & Plan Note (Signed)
-   Target LDL < 70 as has IHD   Lab Results  Component Value Date   CHOL 137 02/11/2016   HDL 49.80 02/11/2016   LDLCALC 57 02/11/2016   LDLDIRECT 75.0 05/22/2015   TRIG 150.0 (H) 02/11/2016   CHOLHDL 3 02/11/2016    Adequate control on present rx, reviewed > no change in rx needed

## 2016-03-31 ENCOUNTER — Encounter: Payer: Self-pay | Admitting: *Deleted

## 2016-03-31 ENCOUNTER — Encounter: Payer: Self-pay | Admitting: Internal Medicine

## 2016-03-31 ENCOUNTER — Ambulatory Visit (INDEPENDENT_AMBULATORY_CARE_PROVIDER_SITE_OTHER): Payer: Medicare Other | Admitting: Internal Medicine

## 2016-03-31 ENCOUNTER — Ambulatory Visit (INDEPENDENT_AMBULATORY_CARE_PROVIDER_SITE_OTHER)
Admission: RE | Admit: 2016-03-31 | Discharge: 2016-03-31 | Disposition: A | Discharge status: Home / Self Care | Payer: Medicare Other | Source: Ambulatory Visit | Attending: Internal Medicine | Admitting: Internal Medicine

## 2016-03-31 VITALS — BP 142/70 | HR 78 | Temp 97.8°F | Ht 70.5 in | Wt 168.0 lb

## 2016-03-31 DIAGNOSIS — R059 Cough, unspecified: Secondary | ICD-10-CM

## 2016-03-31 DIAGNOSIS — R05 Cough: Secondary | ICD-10-CM

## 2016-03-31 MED ORDER — ACETAMINOPHEN-CODEINE #3 300-30 MG PO TABS: ORAL_TABLET | ORAL | 0 refills | Status: DC

## 2016-03-31 MED ORDER — LEVOFLOXACIN 500 MG PO TABS: 500.0000 mg | ORAL_TABLET | Freq: Every day | ORAL | 0 refills | Status: DC

## 2016-03-31 NOTE — Patient Instructions (Addendum)
Levaquin 500 mg daily x 7 days  For cough > mucinex dm 1200 mg every 12 hours as needed   If still cough or hurting when you cough add Tylenol #3 one half to a whole every 4 hours as needed   Please remember to go to the x-ray department downstairs for your tests - we will call you with the results when they are available.  Keep previous appt with Tammy - call if not improving in one week  Needs f/u cxr/ esr / bnp ? Needs CT with contrast next ? If so do sinus CT also

## 2016-03-31 NOTE — Progress Notes (Signed)
Spoke with pt and notified of results per Dr. [name]. Pt verbalized understanding and denied any questions. 

## 2016-03-31 NOTE — Progress Notes (Signed)
Subjective:    Patient ID: SAXON BORTH, male    DOB: 1940-06-13   MRN: [id number]    Brief patient profile:  52  yowm never smoker  with h/o IHD/HBP/hyperlipidemia followed in pulmonary clinic for Primary care.   History of Present Illness   06/30/2011 f/u ov/Breiana Stratmann cc mostly dry cough x one month then new onset LLQ abd pain daily not present sleeping but daily waxes and wanes sev hours at a time no obvious trigger with activity or meals/flatus/ bms rec Prednisone 10 mg take  4 each am x 2 days,   2 each am x 2 days,  1 each am x2days and stop For cough use delsym cough syrup and stop fish oil until no longer coughing at all Try prilosec 20mg   Take 30-60 min before first meal of the day and Pepcid 20 mg one bedtime .  GERD diet  07/14/2011 f/u ov/Zohair Epp cough gone but  cc persistent x one month LLQ pain  Rad across ant abd to naval area plus difficulty with urination x 5 days  But no flank pain, hematuria, dysuria or use of otcs/antihistamine.  Abd pain waxes and wanes worse for maybe an hour then better but always present a little and not worse coughing. No pattern of pain in terms of alleviating or exacerbating.  >>CT abd pelvis neg > referred to [facility]/ [name]> corrected with L Inguinal sugery 12/2011     05/21/2012 f/u ov/Saphyra Hutt for f/u hbp cc fell off ladder x one month, better on nsaids still working remodeling, no cp or sob tia or claudication symptoms rec No change rx   August 05 2012 R shoulder surgery outpt   10/27/2014 f/u ov/Chryl Holten re: hbp/ chronic cough / cp / djd Chief Complaint  Patient presents with  . 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     Note last note by Dr Lovena Le 03/28/14:  back pain with exertion which is his anginal equivalent. He will continue  his current medications. I've encouraged the patient to take a nitroglycerin. He is also encouraged to continue exercising daily. No change in medications at this point. 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     10/10/2015 NP Followup : HTN, Hyperlipidemia  Returns for 4 month follow up .  Walks 1 mile daily. No chest pain.  Feels good. Needs refills.  Started on Mybertriq per urology, seems to be helping his urinary frequency.   rec No change rx     02/11/2016  f/u ov/Shadrack Brummitt re: hbp/ hyperlipidemia  Chief Complaint  Patient presents with  . Follow-up    Pt c/o cough x 3 wks- prod with white to green sputum.    cough p stirs/ drinks coffee not tpically nocturnal/ assoc with nasal congestion "like a head cold" but not sob Teryl Lucy rec zpak  For cough > mucinex dm up to 1200 mg every 12 hours as needed   03/31/2016 acute extended ov/Lititia Sen re: cough x late sept/ early oct 2017 on ppi bid  Chief Complaint  Patient presents with  . Acute Visit    chest tightness, non prod cough and sore throat for the past 3 wks.    some better p last ov and went to [place] on Nov 17th where acutely ill with worse cough/ chills/ sore chest with coughing    No obvious day to day or daytime variability or assoc  mucus plugs or hemoptysis or cp or chest tightness, subjective wheeze or overt sinus or hb symptoms. No unusual exp hx or h/o childhood pna/ asthma or knowledge of premature birth.  Sleeping ok without nocturnal  or early am exacerbation  of respiratory  c/o's or need for noct saba. Also denies any obvious fluctuation of symptoms with weather or environmental changes or other aggravating or alleviating factors except as outlined above   Current Medications, Allergies, Complete Past Medical History, Past Surgical History, Family  History, and Social History were reviewed in [employer] record.  ROS  The following are not active complaints unless bolded sore throat, dysphagia, dental problems, itching, sneezing,  nasal congestion or excess/ purulent secretions, ear ache,   fever, chills, sweats, unintended wt loss, classically pleuritic or exertional cp,  orthopnea pnd or leg swelling, presyncope, palpitations, abdominal pain, anorexia, nausea, vomiting, diarrhea  or change in bowel or bladder habits, change in stools or urine, dysuria,hematuria,  rash, arthralgias, visual complaints, headache, numbness, weakness or ataxia or problems with walking or coordination,  change in mood/affect or memory.             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.........................................Marland KitchenOlin             Objective:   Physical Exam   Ambulatory healthy appearing in no acute distress / vital signs reviewed:   - Note on arrival 02 sats   97% on RA     wt   192 April 09, 2009 > 190 July 21, 2009 > 187 03/26/2012  > 05/21/2012 187 > 09/06/2012  189 > 12/06/12 192>  189 03/07/2013  > 11/08/2013 191 >  02/10/2014 199 >192 03/28/2014 > 05/30/2014   192 > 10/26/2014    170 > 05/22/2015  168 >174 06/05/2015 >  02/11/2016   166  > 03/31/2016  168      HEENT: edentulous  nl turbinates, and oropharynx  Neck without JVD/Nodes/TM  Lungs clear to A and P bilaterally without cough on insp or exp maneuvers  RRR no s3 or murmur or increase in P2   LLE  pulses slt reduced vs R , no edema Abd soft , no tenderness,  organomegaly  EXT no edema,  , nl pulses  MS  Nl gain s deformity or restriction. Neuro: alert/ nl sensorium/ no motor def    CXR PA and Lateral:   03/31/2016 :    I personally reviewed images and agree with radiology impression as follows:    New mild interstitial opacities which may represent mild interstitial edema. Small bilateral pleural effusions.  New bibasilar and superior segment right lower lobe opacities -question airspace disease versus atelectasis. Focal edema or pneumonia are not excluded.  Cardiomegaly.

## 2016-04-01 NOTE — Assessment & Plan Note (Addendum)
Assoc with non-specific changes on cxr some of which suggest chf but hx more c/w pna/ ? parapneumonic effusion on R so start with levaquin 500 mg daily x 7 days then return for f/u cxr/ bnp / esr and possible CT chest  I had an extended discussion with the patient/daughter reviewing all relevant studies completed to date and  lasting 15 to 20 minutes of a 25 minute visit    Each maintenance medication was reviewed in detail including most importantly the difference between maintenance and prns and under what circumstances the prns are to be triggered using an action plan format that is not reflected in the computer generated alphabetically organized AVS.    Please see AVS for unique instructions that I personally wrote and verbalized to the the pt in detail and then reviewed with pt  by my nurse highlighting any  changes in therapy recommended at today's visit to their plan of care.

## 2016-04-15 ENCOUNTER — Ambulatory Visit (INDEPENDENT_AMBULATORY_CARE_PROVIDER_SITE_OTHER): Payer: Medicare Other | Admitting: Internal Medicine

## 2016-04-15 ENCOUNTER — Ambulatory Visit (INDEPENDENT_AMBULATORY_CARE_PROVIDER_SITE_OTHER)
Admission: RE | Admit: 2016-04-15 | Discharge: 2016-04-15 | Disposition: A | Discharge status: Home / Self Care | Payer: Medicare Other | Source: Ambulatory Visit | Attending: Internal Medicine | Admitting: Internal Medicine

## 2016-04-15 ENCOUNTER — Encounter: Payer: Self-pay | Admitting: Internal Medicine

## 2016-04-15 ENCOUNTER — Other Ambulatory Visit (INDEPENDENT_AMBULATORY_CARE_PROVIDER_SITE_OTHER): Payer: Medicare Other

## 2016-04-15 VITALS — BP 118/70 | HR 73 | Ht 70.5 in | Wt 161.2 lb

## 2016-04-15 DIAGNOSIS — J189 Pneumonia, unspecified organism: Secondary | ICD-10-CM

## 2016-04-15 DIAGNOSIS — R05 Cough: Secondary | ICD-10-CM | POA: Diagnosis not present

## 2016-04-15 DIAGNOSIS — I10 Essential (primary) hypertension: Secondary | ICD-10-CM

## 2016-04-15 DIAGNOSIS — R059 Cough, unspecified: Secondary | ICD-10-CM

## 2016-04-15 LAB — CBC WITH DIFFERENTIAL/PLATELET
BASOS ABS: 0.1 10*3/uL (ref 0.0–0.1)
BASOS PCT: 0.9 % (ref 0.0–3.0)
EOS ABS: 0.5 10*3/uL (ref 0.0–0.7)
Eosinophils Relative: 6.5 % — ABNORMAL HIGH (ref 0.0–5.0)
HEMATOCRIT: 36.5 % — AB (ref 39.0–52.0)
Hemoglobin: 12.2 g/dL — ABNORMAL LOW (ref 13.0–17.0)
LYMPHS ABS: 1.6 10*3/uL (ref 0.7–4.0)
LYMPHS PCT: 21.3 % (ref 12.0–46.0)
MCHC: 33.5 g/dL (ref 30.0–36.0)
MCV: 89.3 fl (ref 78.0–100.0)
MONO ABS: 0.6 10*3/uL (ref 0.1–1.0)
Monocytes Relative: 8.9 % (ref 3.0–12.0)
NEUTROS ABS: 4.6 10*3/uL (ref 1.4–7.7)
Neutrophils Relative %: 62.4 % (ref 43.0–77.0)
PLATELETS: 189 10*3/uL (ref 150.0–400.0)
RBC: 4.08 Mil/uL — ABNORMAL LOW (ref 4.22–5.81)
RDW: 13.9 % (ref 11.5–15.5)
WBC: 7.3 10*3/uL (ref 4.0–10.5)

## 2016-04-15 LAB — BASIC METABOLIC PANEL
BUN: 21 mg/dL (ref 6–23)
CHLORIDE: 103 meq/L (ref 96–112)
CO2: 30 meq/L (ref 19–32)
Calcium: 9.1 mg/dL (ref 8.4–10.5)
Creatinine, Ser: 1.63 mg/dL — ABNORMAL HIGH (ref 0.40–1.50)
GFR: 44.02 mL/min — ABNORMAL LOW (ref >60.00)
GLUCOSE: 108 mg/dL — AB (ref 70–99)
POTASSIUM: 4.3 meq/L (ref 3.5–5.1)
SODIUM: 141 meq/L (ref 135–145)

## 2016-04-15 LAB — SEDIMENTATION RATE: SED RATE: 35 mm/h — AB (ref 0–20)

## 2016-04-15 NOTE — Patient Instructions (Addendum)
Please see patient coordinator before you leave today  to schedule sinus CT   Please remember to go to the lab   department downstairs for your tests - we will call you with the results when they are available.     Please schedule a follow up office visit in 4 weeks, sooner if needed

## 2016-04-15 NOTE — Progress Notes (Signed)
Subjective:    Patient ID: Isaac [name], male    DOB: [birth year]   MRN: [id number]    Brief patient profile:  59  yowm never smoker  with h/o IHD/HBP/hyperlipidemia followed in pulmonary clinic for Primary care.   History of Present Illness   06/30/2011 f/u ov/Isaac [name] cc mostly dry cough x one month then new onset LLQ abd pain daily not present sleeping but daily waxes and wanes sev hours at a time no obvious trigger with activity or meals/flatus/ bms rec Prednisone 10 mg take  4 each am x 2 days,   2 each am x 2 days,  1 each am x2days and stop For cough use delsym cough syrup and stop fish oil until no longer coughing at all Try prilosec 20mg   Take 30-60 min before first meal of the day and Pepcid 20 mg one bedtime .  GERD diet  07/14/2011 f/u ov/Isaac [name] cough gone but  cc persistent x one month LLQ pain  Rad across ant abd to naval area plus difficulty with urination x 5 days  But no flank pain, hematuria, dysuria or use of otcs/antihistamine.  Abd pain waxes and wanes worse for maybe an hour then better but always present a little and not worse coughing. No pattern of pain in terms of alleviating or exacerbating.  >>CT abd pelvis neg > referred to [facility]/ [name]> corrected with L Inguinal sugery 12/2011     05/21/2012 f/u ov/Isaac [name] for f/u hbp cc fell off ladder x one month, better on nsaids still working remodeling, no cp or sob tia or claudication symptoms rec No change rx   August 05 2012 R shoulder surgery outpt   10/27/2014 f/u ov/Isaac [name] re: hbp/ chronic cough / cp / djd Chief Complaint  Patient presents with  . 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     Note last note by Dr Lovena Le 03/28/14:  back pain with exertion which is his anginal equivalent. He will continue  his current medications. I've encouraged the patient to take a nitroglycerin. He is also encouraged to continue exercising daily. No change in medications at this point. 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     02/11/2016  f/u ov/Isaac [name] re: hbp/ hyperlipidemia new cough since late sept 2017  Chief Complaint  Patient presents with  . Follow-up    Pt c/o cough x 3 wks- prod with white to green sputum.    cough p stirs/ drinks coffee not tpically nocturnal/ assoc with nasal congestion "like a head cold" but not sob Teryl Lucy rec zpak  For cough > mucinex dm up to 1200 mg every 12 hours as needed   03/31/2016 acute extended ov/Isaac [name] re: cough x late sept/ early oct 2017 on ppi bid  Chief Complaint  Patient presents with  . Acute Visit    chest tightness, non prod cough and sore throat for the past 3 wks.    some better p last ov and went to [place] on Nov 17th where acutely ill with worse  cough/ chills/ sore chest with coughing  rec Levaquin 500 mg daily x 7 days For cough > mucinex dm 1200 mg every 12 hours as needed  If still cough or hurting when you cough add Tylenol #3 one half to a whole every 4 hours as needed  Please remember to go to the x-ray department ? pna > rx x    levaquin 500 x 7 days        04/15/2016  f/u ov/Isaac [name] re:  ? pna / chronic cough / hbp  Chief Complaint  Patient presents with  . Follow-up    for PNA. Pt. states he feels a lot better than last month. Breathing has been ok.       No obvious day to day or daytime variability or assoc  mucus plugs or hemoptysis or cp or chest tightness, subjective wheeze or overt sinus or hb symptoms. No unusual exp hx or h/o childhood pna/ asthma or knowledge of premature birth.  Sleeping ok without nocturnal  or early am exacerbation  of respiratory  c/o's  or need for noct saba. Also denies any obvious fluctuation of symptoms with weather or environmental changes or other aggravating or alleviating factors except as outlined above   Current Medications, Allergies, Complete Past Medical History, Past Surgical History, Family History, and Social History were reviewed in [employer] record.  ROS  The following are not active complaints unless bolded sore throat, dysphagia, dental problems, itching, sneezing,  nasal congestion or excess/ purulent secretions, ear ache,   fever, chills, sweats, unintended wt loss, classically pleuritic or exertional cp,  orthopnea pnd or leg swelling, presyncope, palpitations, abdominal pain, anorexia, nausea, vomiting, diarrhea  or change in bowel or bladder habits, change in stools or urine, dysuria,hematuria,  rash, arthralgias, visual complaints, headache, numbness, weakness or ataxia or problems with walking or coordination,  change in mood/affect or memory.             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.........................................Marland KitchenOlin             Objective:   Physical Exam   Ambulatory healthy appearing in no acute distress / vital signs reviewed:   - Note on arrival 02 sats   93% on RA     wt   192  April 09, 2009 > 190 July 21, 2009 > 187 03/26/2012  > 05/21/2012 187 > 09/06/2012  189 > 12/06/12 192>  189 03/07/2013  > 11/08/2013 191 >  02/10/2014 199 >192 03/28/2014 > 05/30/2014   192 > 10/26/2014    170 > 05/22/2015  168 >174 06/05/2015 >  02/11/2016   166  > 03/31/2016  168 > 04/16/2016  161     HEENT: edentulous  nl turbinates, and oropharynx  Neck without JVD/Nodes/TM  Lungs clear to A and P bilaterally without cough on insp or exp maneuvers  RRR no s3 or murmur or increase in P2   LLE  pulses slt reduced vs R , no edema Abd soft , no tenderness,  organomegaly  EXT no edema,  , nl pulses  MS  Nl gain s deformity or restriction. Neuro: alert/ nl sensorium/ no motor def    CXR PA and Lateral:   04/15/2016 :    I personally reviewed images and agree with radiology impression as follows:    Fibrotic change in the right mid lung and lung bases. No frank edema or consolidation. Stable cardiac silhouette. Patient is status post coronary artery bypass grafting. My impression: acute changes from last ov have completely resolved     Labs ordered 04/15/2016  Allergy profile  Lab Results  Component Value Date   ESRSEDRATE 35 (H) 04/15/2016   ESRSEDRATE 63 (H) 05/30/2014      Chemistry      Component Value Date/Time   NA 141 04/15/2016 1204   K 4.3 04/15/2016 1204   CL 103 04/15/2016 1204   CO2 30 04/15/2016 1204   BUN 21 04/15/2016 1204   CREATININE 1.63 (H) 04/15/2016 1204   CREATININE 1.33 10/13/2011 1653      Component Value Date/Time   CALCIUM 9.1 04/15/2016 1204   ALKPHOS 60 02/11/2016 1056   AST 13 02/11/2016 1056   ALT 9 02/11/2016 1056   BILITOT 0.6 02/11/2016 1056

## 2016-04-15 NOTE — Assessment & Plan Note (Deleted)
Lab Results  Component Value Date   CREATININE 1.47 02/11/2016   CREATININE 1.85 (H) 06/05/2015   CREATININE 2.81 (H) 05/22/2015   CREATININE 1.33 10/13/2011

## 2016-04-16 DIAGNOSIS — J189 Pneumonia, unspecified organism: Secondary | ICD-10-CM

## 2016-04-16 HISTORY — DX: Pneumonia, unspecified organism: J18.9

## 2016-04-16 NOTE — Assessment & Plan Note (Signed)
-   onset  Late sept 2017 rx zpak  - Rx levaquin 03/31/2016 > improved 04/15/2016  - Allergy profile 04/15/2016 >  Eos 0.5 /  IgE   - Sinus CT 04/16/2016 >>>        cxr is unimpressive now and I strongly doubt that he had pneumonia dating back to September though there is little doubt that he had acute pulmonary infiltrates that it resolved on Levaquin so most likely he did have a superimposed pneumonia that has resolved but what  is remaining is   most likely  Upper airway cough syndrome (previously labeled PNDS) , is  so named because it's frequently impossible to sort out how much is  CR/sinusitis with freq throat clearing (which can be related to primary GERD)   vs  causing  secondary (" extra esophageal")  GERD from wide swings in gastric pressure that occur with throat clearing, often  promoting self use of mint and menthol lozenges that reduce the lower esophageal sphincter tone and exacerbate the problem further in a cyclical fashion.   These are the same pts (now being labeled as having "irritable larynx syndrome" by some cough centers) who not infrequently have a history of having failed to tolerate ace inhibitors,  dry powder inhalers or biphosphonates or report having atypical/extraesophageal reflux symptoms that don't respond to standard doses of PPI  and are easily confused as having aecopd or asthma flares by even experienced allergists/ pulmonologists (myself included).   Will check sinus ct/ allergy w/u/ no further abx for now

## 2016-04-16 NOTE — Assessment & Plan Note (Signed)
See cxr 04/15/16 > treated with 7 days of Levaquin 500 mg daily with resolution radiographically and clinically.  I had an extended discussion with the patient reviewing all relevant studies completed to date and  lasting 15 to 20 minutes of a 25 minute visit    Each maintenance medication was reviewed in detail including most importantly the difference between maintenance and prns and under what circumstances the prns are to be triggered using an action plan format that is not reflected in the computer generated alphabetically organized AVS.    Please see AVS for unique instructions that I personally wrote and verbalized to the the pt in detail and then reviewed with pt  by my nurse highlighting any  changes in therapy recommended at today's visit to their plan of care.

## 2016-04-16 NOTE — Progress Notes (Signed)
Spoke with pt and notified of results per Dr. [name]. Pt verbalized understanding and denied any questions. 

## 2016-04-16 NOTE — Assessment & Plan Note (Signed)
Lab Results  Component Value Date   CREATININE 1.63 (H) 04/15/2016   CREATININE 1.47 02/11/2016   CREATININE 1.85 (H) 06/05/2015   CREATININE 1.33 [id number]     Complicated by mild CRI so need to avoid nsaids/ Continue to monitor every 3 months.

## 2016-04-16 NOTE — Progress Notes (Signed)
Left detailed msg with lab results 

## 2016-04-17 LAB — RESPIRATORY ALLERGY PROFILE REGION II ~~LOC~~
Allergen, A. alternata, m6: <0.1 kU/L
Allergen, Cedar tree, t12: <0.1 kU/L
Allergen, Comm Silver Birch, t9: <0.1 kU/L
Allergen, Mouse Urine Protein, e78: <0.1 kU/L
Allergen, Oak,t7: <0.1 kU/L
Allergen, P. notatum, m1: <0.1 kU/L
Aspergillus fumigatus, m3: <0.1 kU/L
Bermuda Grass: <0.1 kU/L
Box Elder IgE: <0.1 kU/L
Cat Dander: <0.1 kU/L
Common Ragweed: <0.1 kU/L
IGE (IMMUNOGLOBULIN E), SERUM: 20 kU/L (ref <115)
Pecan/Hickory Tree IgE: <0.1 kU/L
Rough Pigweed  IgE: <0.1 kU/L
Sheep Sorrel IgE: <0.1 kU/L
Timothy Grass: <0.1 kU/L

## 2016-04-18 ENCOUNTER — Ambulatory Visit (INDEPENDENT_AMBULATORY_CARE_PROVIDER_SITE_OTHER)
Admission: RE | Admit: 2016-04-18 | Discharge: 2016-04-18 | Disposition: A | Discharge status: Home / Self Care | Payer: Medicare Other | Source: Ambulatory Visit | Attending: Internal Medicine | Admitting: Internal Medicine

## 2016-04-18 DIAGNOSIS — R05 Cough: Secondary | ICD-10-CM | POA: Diagnosis not present

## 2016-04-18 DIAGNOSIS — R059 Cough, unspecified: Secondary | ICD-10-CM

## 2016-04-18 NOTE — Progress Notes (Signed)
Called and left a detailed msg.

## 2016-05-13 ENCOUNTER — Ambulatory Visit (INDEPENDENT_AMBULATORY_CARE_PROVIDER_SITE_OTHER): Payer: Medicare Other | Admitting: Adult Health

## 2016-05-13 ENCOUNTER — Encounter: Payer: Self-pay | Admitting: Adult Health

## 2016-05-13 DIAGNOSIS — J189 Pneumonia, unspecified organism: Secondary | ICD-10-CM | POA: Diagnosis not present

## 2016-05-13 DIAGNOSIS — N184 Chronic kidney disease, stage 4 (severe): Secondary | ICD-10-CM | POA: Diagnosis not present

## 2016-05-13 DIAGNOSIS — I10 Essential (primary) hypertension: Secondary | ICD-10-CM

## 2016-05-13 MED ORDER — DOXAZOSIN MESYLATE 4 MG PO TABS: ORAL_TABLET | ORAL | 3 refills | Status: DC

## 2016-05-13 MED ORDER — FUROSEMIDE 40 MG PO TABS: 40.0000 mg | ORAL_TABLET | Freq: Every day | ORAL | 5 refills | Status: DC | PRN

## 2016-05-13 NOTE — Progress Notes (Signed)
Chart and office note reviewed in detail  > agree with a/p as outlined    

## 2016-05-13 NOTE — Assessment & Plan Note (Signed)
Cont on lasix As needed   Avoid nsaids  Repeat bmet on return

## 2016-05-13 NOTE — Assessment & Plan Note (Signed)
Recently dx PNA , improved with Levaquin w/ cxr clearance .

## 2016-05-13 NOTE — Assessment & Plan Note (Addendum)
Controlled on current reigmen  Patient's medications were reviewed today and patient education was given. Computerized medication calendar was adjusted/completed   Low salt diet Cont current regimen

## 2016-05-13 NOTE — Patient Instructions (Addendum)
Follow med calendar closely and bring to each visit .  Low salt and cholesterol diet.  Follow up Dr. Melvyn Novas  In 4 -6 weeks  and As needed

## 2016-05-13 NOTE — Addendum Note (Signed)
Addended by: Doroteo Glassman D on: 05/13/2016 12:00 PM   Modules accepted: Orders

## 2016-05-13 NOTE — Progress Notes (Signed)
@Patient  ID: Isaac [name], male    DOB: 31-May-1940, 76 y.o.   MRN: [id number]  Chief Complaint  Patient presents with  . Follow-up    Cough     Referring provider: Tanda Rockers, MD  HPI: [id number]  yowm never smoker  with h/o IHD/HBP/hyperlipidemia followed in pulmonary clinic for Primary care  TEST  -Td 05/2004,  05/30/2014  -Pneumovax 03/2005 , Prevnar 03/28/2014  -flu shot 2017   05/13/2016 Follow up : HTN/Dyslipidema /Med review /Pna  Pt returns for 1 month follow up .  Says he is starting to feel better.  Recent PNA , treated with Levaquin for 1 week. Doing better with less cough . Coughs up little clear/white mucus in am.   B/P doing well. Needs refills of meds. No increased leg swelling or headache .   We reviewed all his meds and organized them into a med calendar w/ pt education.  Appears to be taking correctly.   Has known chronic renal failure stage 4 . . Lasix was changed to As needed. Avoid NSAID .  No increased leg swelling    No Known Allergies  Immunization History  Administered Date(s) Administered  . H1N1 05/02/2008  . Influenza Split 02/10/2014  . Influenza Whole 01/17/2008, 04/09/2009, 01/03/2011, 02/27/2012  . Influenza,inj,Quad PF,36+ Mos 03/07/2013, 06/05/2015, 02/11/2016  . Pneumococcal Conjugate-13 03/28/2014  . Tdap 06/30/2011, 05/30/2014    Past Medical History:  Diagnosis Date  . Allergic rhinitis   . Allergy   . CAD (coronary artery disease) of artery bypass graft, Occluded VG-non dominant LCX medical therapy 11/25/2013  . GERD (gastroesophageal reflux disease)   . H/O hiatal hernia   . History of skin cancer    bilateral arms with removal  . Hyperlipidemia   . Hypertension   . Ischemic heart disease   . Lumbar disc disease   . Myocardial infarction 2002    Tobacco History: History  Smoking Status  . Never Smoker  Smokeless Tobacco  . Former Systems developer  . Quit date: 04/28/2014   Counseling given: Not Answered   Outpatient Encounter  Prescriptions as of 05/13/2016  Medication Sig  . acetaminophen-codeine (TYLENOL #3) 300-30 MG tablet One every 4 hours as needed for cough  . aspirin (ADULT ASPIRIN EC LOW STRENGTH) 81 MG EC tablet Take 81 mg by mouth every morning.   . Cholecalciferol (VITAMIN D) 2000 UNITS tablet Take 2,000 Units by mouth daily.   [name] doxazosin (CARDURA) 4 MG tablet TAKE 1 TABLET IN THE MORNING AND 1 TABLET AT BEDTIME  . fluticasone (FLONASE) 50 MCG/ACT nasal spray Place 2 sprays into both nostrils daily.  . furosemide (LASIX) 40 MG tablet Take 40 mg by mouth daily as needed. Reported on 10/10/2015  . KLOR-CON M20 20 MEQ tablet TAKE ONE TABLET BY MOUTH ONCE DAILY WHEN  YOU  TAKE LASIX  . metoprolol succinate (TOPROL-XL) 50 MG 24 hr tablet TAKE ONE TABLET BY MOUTH IN THE MORNING AND ONE-HALF IN THE EVENING  . Multiple Vitamin (MULTIVITAMIN WITH MINERALS) TABS Take 1 tablet by mouth daily.   . multivitamin-lutein (OCUVITE-LUTEIN) CAPS capsule Take 1 capsule by mouth daily.  [name] MYRBETRIQ 50 MG TB24 tablet Take 1 tablet by mouth daily.  . nitroGLYCERIN (NITROSTAT) 0.4 MG SL tablet Place 1 tablet (0.4 mg total) under the tongue every 5 (five) minutes as needed for chest pain (MAX 3 TABLETS).  [name] omeprazole (PRILOSEC) 20 MG capsule TAKE 1 CAPSULE TWICE A DAY  . oxymetazoline (AFRIN) 0.05 %  nasal spray Place 2 sprays into the nose every 12 (twelve) hours as needed for congestion (x3 days). Dryness  . Simethicone (GAS-X EXTRA STRENGTH) 125 MG CAPS Reported on 10/10/2015  . simvastatin (ZOCOR) 20 MG tablet Take 1 tablet (20 mg total) by mouth at bedtime.   No facility-administered encounter medications on file as of 05/13/2016.      Review of Systems  Constitutional:   No  weight loss, night sweats,  Fevers, chills,  +fatigue, or  lassitude.  HEENT:   No headaches,  Difficulty swallowing,  Tooth/dental problems, or  Sore throat,                No sneezing, itching, ear ache, nasal congestion, post nasal drip,   CV:   No chest pain,  Orthopnea, PND, swelling in lower extremities, anasarca, dizziness, palpitations, syncope.   GI  No heartburn, indigestion, abdominal pain, nausea, vomiting, diarrhea, change in bowel habits, loss of appetite, bloody stools.   Resp:   No excess mucus, no productive cough,  No non-productive cough,  No coughing up of blood.  No change in color of mucus.  No wheezing.  No chest wall deformity  Skin: no rash or lesions.  GU: no dysuria, change in color of urine, no urgency or frequency.  No flank pain, no hematuria   MS:  No joint pain or swelling.  No decreased range of motion.  No back pain.    Physical Exam  BP 130/62   Pulse 69   Ht 5' 10.5" (1.791 m)   Wt 166 lb 6.4 oz (75.5 kg)   SpO2 99%   BMI 23.54 kg/m   GEN: A/Ox3; pleasant , NAD, frail and elderly    HEENT:  [place]/AT,  EACs-clear, TMs-wnl, NOSE-clear, THROAT-clear, no lesions, no postnasal drip or exudate noted.   NECK:  Supple w/ fair ROM; no JVD; normal carotid impulses w/o bruits; no thyromegaly or nodules palpated; no lymphadenopathy.    RESP  CTA   P & A; w/o, wheezes/ rales/ or rhonchi. no accessory muscle use, no dullness to percussion  CARD:  RRR, no m/r/g, no peripheral edema, pulses intact, no cyanosis or clubbing.  GI:   Soft & nt; nml bowel sounds; no organomegaly or masses detected.   Musco: Warm bil, no deformities or joint swelling noted.   Neuro: alert, no focal deficits noted.    Skin: Warm, no lesions or rashes  Psych:  No change in mood or affect. No depression or anxiety.  No memory loss.  Lab Results:  CBC    Component Value Date/Time   WBC 7.3 04/15/2016 1204   RBC 4.08 (L) 04/15/2016 1204   HGB 12.2 (L) 04/15/2016 1204   HCT 36.5 (L) 04/15/2016 1204   PLT 189.0 04/15/2016 1204   MCV 89.3 04/15/2016 1204   MCH 32.5 02/20/2014 0637   MCHC 33.5 04/15/2016 1204   RDW 13.9 04/15/2016 1204   LYMPHSABS 1.6 04/15/2016 1204   MONOABS 0.6 04/15/2016 1204   EOSABS 0.5  04/15/2016 1204   BASOSABS 0.1 04/15/2016 1204    BMET    Component Value Date/Time   NA 141 04/15/2016 1204   K 4.3 04/15/2016 1204   CL 103 04/15/2016 1204   CO2 30 04/15/2016 1204   GLUCOSE 108 (H) 04/15/2016 1204   BUN 21 04/15/2016 1204   CREATININE 1.63 (H) 04/15/2016 1204   CREATININE 1.33 10/13/2011 1653   CALCIUM 9.1 04/15/2016 1204   GFRNONAA 37 (L) 02/22/2014 [id number]  GFRAA 43 (L) 02/22/2014 0323    BNP No results [name] for: BNP  ProBNP    Component Value Date/Time   PROBNP 132.0 (H) 03/28/2014 0948    Imaging: Dg Chest 2 View  Result Date: 04/15/2016 CLINICAL DATA:  Cough.  Recent pneumonia EXAM: CHEST  2 VIEW COMPARISON:  March 31, 2016 FINDINGS: There has been partial clearing of patchy opacity from the right mid lung. There remains apparent fibrotic type change in the right mid lung. There is also fibrosis in lung bases. The previously noted pleural effusions and patchy consolidation in the bases has cleared. No new opacity is evident. The heart size and pulmonary vascularity are normal. Patient is status post coronary artery bypass grafting. No adenopathy. Bones are osteoporotic. Marked anterior wedging of the L1 vertebral body is stable. A lesser degree of anterior wedging at T12 is likewise stable. IMPRESSION: Fibrotic change in the right mid lung and lung bases. No frank edema or consolidation. Stable cardiac silhouette. Patient is status post coronary artery bypass grafting. Electronically Signed   By: Lowella Grip III M.D.   On: 04/15/2016 14:12   Ct Maxillofacial Ltd Wo Cm  Result Date: 04/18/2016 CLINICAL DATA:  Cough and sinus drainage. EXAM: CT PARANASAL SINUS LIMITED WITHOUT CONTRAST TECHNIQUE: Non-contiguous multidetector CT images of the paranasal sinuses were obtained in a single plane without contrast. COMPARISON:  None. FINDINGS: The globes and extraocular muscles appear symmetrical. No air fluid levels in the paranasal sinuses. There is mild  polypoid mucosal thickening of bilateral ethmoid sinuses. There is slight deviation of the nasal septum to the left. IMPRESSION: Mild polypoid mucosal thickening of bilateral ethmoid sinuses, likely representing chronic sinusitis. Deviation of the nasal septum to the left, which may represent posttraumatic changes. Electronically Signed   By: Fidela Salisbury M.D.   On: 04/18/2016 12:31     Assessment & Plan:   No problem-specific Assessment & Plan notes [name] for this encounter.     Rexene Edison, NP 05/13/2016

## 2016-05-26 NOTE — Addendum Note (Signed)
Addended by: Doroteo Glassman D on: 05/26/2016 01:34 PM   Modules accepted: Orders

## 2016-05-30 ENCOUNTER — Telehealth: Payer: Self-pay | Admitting: Internal Medicine

## 2016-05-30 NOTE — Telephone Encounter (Signed)
lmomtcb x1 

## 2016-06-02 NOTE — Telephone Encounter (Signed)
lmtcb x1 for pt. 

## 2016-06-02 NOTE — Telephone Encounter (Signed)
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

## 2016-06-02 NOTE — Telephone Encounter (Signed)
Called and spoke with pt and he stated that he will need a note for work stating that he is to be out of work from 2/2 until 3/31.  The fax number that this needs to go to is in this note.  MW please advise. thanks

## 2016-06-03 ENCOUNTER — Encounter: Payer: Self-pay | Admitting: *Deleted

## 2016-06-03 NOTE — Telephone Encounter (Signed)
Spoke with pt and advised that Dr Melvyn Novas is ok with sending letter.  Letter printed and faxed to number requested.  Nothing further needed at this time.

## 2016-08-12 ENCOUNTER — Other Ambulatory Visit: Payer: Self-pay | Admitting: Internal Medicine

## 2016-09-11 ENCOUNTER — Ambulatory Visit (INDEPENDENT_AMBULATORY_CARE_PROVIDER_SITE_OTHER): Payer: Medicare Other | Admitting: Internal Medicine

## 2016-09-11 ENCOUNTER — Encounter: Payer: Self-pay | Admitting: Internal Medicine

## 2016-09-11 ENCOUNTER — Other Ambulatory Visit (INDEPENDENT_AMBULATORY_CARE_PROVIDER_SITE_OTHER): Payer: Medicare Other

## 2016-09-11 ENCOUNTER — Ambulatory Visit (INDEPENDENT_AMBULATORY_CARE_PROVIDER_SITE_OTHER)
Admission: RE | Admit: 2016-09-11 | Discharge: 2016-09-11 | Disposition: A | Discharge status: Home / Self Care | Payer: Medicare Other | Source: Ambulatory Visit | Attending: Internal Medicine | Admitting: Internal Medicine

## 2016-09-11 VITALS — BP 144/66 | HR 66 | Ht 70.5 in | Wt 168.0 lb

## 2016-09-11 DIAGNOSIS — N184 Chronic kidney disease, stage 4 (severe): Secondary | ICD-10-CM

## 2016-09-11 DIAGNOSIS — R0609 Other forms of dyspnea: Secondary | ICD-10-CM

## 2016-09-11 DIAGNOSIS — R0600 Dyspnea, unspecified: Secondary | ICD-10-CM

## 2016-09-11 DIAGNOSIS — R0602 Shortness of breath: Secondary | ICD-10-CM | POA: Diagnosis not present

## 2016-09-11 DIAGNOSIS — R059 Cough, unspecified: Secondary | ICD-10-CM

## 2016-09-11 DIAGNOSIS — I10 Essential (primary) hypertension: Secondary | ICD-10-CM

## 2016-09-11 DIAGNOSIS — R05 Cough: Secondary | ICD-10-CM | POA: Diagnosis not present

## 2016-09-11 LAB — BASIC METABOLIC PANEL
BUN: 17 mg/dL (ref 6–23)
CO2: 29 meq/L (ref 19–32)
CREATININE: 1.61 mg/dL — AB (ref 0.40–1.50)
Calcium: 9.5 mg/dL (ref 8.4–10.5)
Chloride: 103 mEq/L (ref 96–112)
GFR: 44.6 mL/min — ABNORMAL LOW (ref >60.00)
GLUCOSE: 81 mg/dL (ref 70–99)
Potassium: 4 mEq/L (ref 3.5–5.1)
Sodium: 140 mEq/L (ref 135–145)

## 2016-09-11 LAB — CBC WITH DIFFERENTIAL/PLATELET
Basophils Absolute: 0.1 10*3/uL (ref 0.0–0.1)
Basophils Relative: 1.2 % (ref 0.0–3.0)
EOS PCT: 5.7 % — AB (ref 0.0–5.0)
Eosinophils Absolute: 0.5 10*3/uL (ref 0.0–0.7)
HEMATOCRIT: 39.4 % (ref 39.0–52.0)
HEMOGLOBIN: 13.2 g/dL (ref 13.0–17.0)
Lymphocytes Relative: 22 % (ref 12.0–46.0)
Lymphs Abs: 2.1 10*3/uL (ref 0.7–4.0)
MCHC: 33.5 g/dL (ref 30.0–36.0)
MCV: 88.8 fl (ref 78.0–100.0)
MONOS PCT: 9.1 % (ref 3.0–12.0)
Monocytes Absolute: 0.8 10*3/uL (ref 0.1–1.0)
Neutro Abs: 5.8 10*3/uL (ref 1.4–7.7)
Neutrophils Relative %: 62 % (ref 43.0–77.0)
Platelets: 175 10*3/uL (ref 150.0–400.0)
RBC: 4.43 Mil/uL (ref 4.22–5.81)
RDW: 15.7 % — ABNORMAL HIGH (ref 11.5–15.5)
WBC: 9.3 10*3/uL (ref 4.0–10.5)

## 2016-09-11 LAB — TSH: TSH: 2.36 u[IU]/mL (ref 0.35–4.50)

## 2016-09-11 LAB — BRAIN NATRIURETIC PEPTIDE: Pro B Natriuretic peptide (BNP): 803 pg/mL — ABNORMAL HIGH (ref 0.0–100.0)

## 2016-09-11 NOTE — Patient Instructions (Addendum)
You are overdue for cardiology follow up, call Dr Tanna Furry office to arrange it   Please remember to go to the lab and x-ray department downstairs in the basement  for your tests - we will call you with the results when they are available.     Please schedule a follow up visit in 3 months but call sooner if needed  Add:   may need to change to clinoril and stop anaprox next step

## 2016-09-11 NOTE — Progress Notes (Signed)
Subjective:    Patient ID: Isaac [name], male    DOB: [birth year]   MRN: [id number]    Brief patient profile:  59  yowm never smoker  with h/o IHD/HBP/hyperlipidemia followed in pulmonary clinic for Primary care.   History of Present Illness   06/30/2011 f/u ov/Isaac [name] cc mostly dry cough x one month then new onset LLQ abd pain daily not present sleeping but daily waxes and wanes sev hours at a time no obvious trigger with activity or meals/flatus/ bms rec Prednisone 10 mg take  4 each am x 2 days,   2 each am x 2 days,  1 each am x2days and stop For cough use delsym cough syrup and stop fish oil until no longer coughing at all Try prilosec 20mg   Take 30-60 min before first meal of the day and Pepcid 20 mg one bedtime .  GERD diet  07/14/2011 f/u ov/Isaac [name] cough gone but  cc persistent x one month LLQ pain  Rad across ant abd to naval area plus difficulty with urination x 5 days  But no flank pain, hematuria, dysuria or use of otcs/antihistamine.  Abd pain waxes and wanes worse for maybe an hour then better but always present a little and not worse coughing. No pattern of pain in terms of alleviating or exacerbating.  >>CT abd pelvis neg > referred to [name]> corrected with L Inguinal sugery 12/2011     05/21/2012 f/u ov/Isaac [name] for f/u hbp cc fell off ladder x one month, better on nsaids still working remodeling, no cp or sob tia or claudication symptoms rec No change rx   August 05 2012 R shoulder surgery outpt   10/27/2014 f/u ov/Isaac [name] re: hbp/ chronic cough / cp / djd Chief Complaint  Patient presents with  . 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     Note last note by Dr Lovena Le 03/28/14:  back pain with exertion which is his anginal equivalent. He will continue  his current medications. I've encouraged the patient to take a nitroglycerin. He is also encouraged to continue exercising daily. No change in medications at this point. 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     02/11/2016  f/u ov/Isaac [name] re: hbp/ hyperlipidemia new cough since late sept 2017  Chief Complaint  Patient presents with  . Follow-up    Pt c/o cough x 3 wks- prod with white to green sputum.    cough p stirs/ drinks coffee not tpically nocturnal/ assoc with nasal congestion "like a head cold" but not sob Teryl Lucy rec zpak  For cough > mucinex dm up to 1200 mg every 12 hours as needed   03/31/2016 acute extended ov/Isaac [name] re: cough x late sept/ early oct 2017 on ppi bid  Chief Complaint  Patient presents with  . Acute Visit    chest tightness, non prod cough and sore throat for the past 3 wks.    some better p last ov and went to [place] on Nov 17th where acutely ill with worse  cough/ chills/ sore chest with coughing  rec Levaquin 500 mg daily x 7 days For cough > mucinex dm 1200 mg every 12 hours as needed  If still cough or hurting when you cough add Tylenol #3 one half to a whole every 4 hours as needed  Please remember to go to the x-ray department ? pna > rx x    levaquin 500 x 7 days        04/15/2016  f/u ov/Isaac [name] re:  ? pna / chronic cough / hbp  Chief Complaint  Patient presents with  . Follow-up    for PNA. Pt. states he feels a lot better than last month. Breathing has been ok.   rec Please see patient coordinator before you leave today  to schedule sinus CT > chronic changes Allergy profile > neg     09/11/2016  f/u ov/Isaac [name] re:  Chief Complaint  Patient presents with  . Follow-up    Still coughing with white sputum.  He has good days and bad with his breathing.    cough is worse p  breakfast x 1 tbsp and rest day ok  Sleep ok  Mailbox is 250 feet up hill and has to stop some days new since pna   No obvious day to day or daytime variability or assoc excess/ purulent sputum or mucus plugs or hemoptysis or cp or chest tightness, subjective wheeze or overt sinus or hb symptoms. No unusual exp hx or h/o childhood pna/ asthma or knowledge of premature birth.  Sleeping ok without nocturnal  or early am exacerbation  of respiratory  c/o's or need for noct saba. Also denies any obvious fluctuation of symptoms with weather or environmental changes or other aggravating or alleviating factors except as outlined above   Current Medications, Allergies, Complete Past Medical History, Past Surgical History, Family History, and Social History were reviewed in [employer] record.  ROS  The following are not active complaints unless bolded sore throat, dysphagia, dental problems, itching, sneezing,  nasal congestion or excess/ purulent secretions, ear ache,   fever, chills, sweats, unintended wt loss, classically pleuritic or exertional cp,  orthopnea pnd or leg swelling, presyncope, palpitations, abdominal pain, anorexia, nausea, vomiting, diarrhea  or change in bowel or bladder habits, change in stools or urine, dysuria,hematuria,  rash, arthralgias, visual complaints, headache, numbness, weakness or ataxia or problems with walking or coordination,  change in mood/affect or memory.             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 [name] ARTHRALGIA (ICD-719.40)  HYPERTENSION (ICD-401.9)  HYPERLIPIDEMIA (ICD-272.4)  ALLERGIC RHINITIS (ICD-477.9)  L IH repair 1984 GERD/ Barrett's............................................................................[name] [facility]  LLQ abd pain onset 05/2011...........................................................[name] [name] -  01/23/12  scar tissue removed > pain resolved  HEALTH MAINTENANCE.................................................................Marland KitchenWert  -Td 05/2004,  05/30/2014  -Pneumovax 03/2005 , Prevnar 03/28/2014  -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.........................................Marland KitchenOlin             Objective:   Physical Exam   Ambulatory healthy appearing mildly hoarse wm  nad / vital signs reviewed:   - Note on arrival 02 sats   99% on RA     wt   192 April 09, 2009 > 190 July 21, 2009 > 187 03/26/2012  > 05/21/2012 187 > 09/06/2012  189 > 12/06/12 192>  189 03/07/2013  > 11/08/2013 191 >  02/10/2014 199 >192 03/28/2014 > 05/30/2014   192 > 10/26/2014  170 > 05/22/2015  168 >174 06/05/2015 >  02/11/2016   166  > 03/31/2016  168 > 04/16/2016  161 > 09/11/2016   168     HEENT: edentulous  nl turbinates, and oropharynx  Neck without JVD/Nodes/TM  Lungs clear to A and P bilaterally without cough on insp or exp maneuvers  RRR no s3 or murmur or increase in P2   LLE  pulses min  reduced vs R , no edema Abd soft , no tenderness,  organomegaly  EXT no edema,  , nl pulses  MS  Nl gain s deformity or restriction. Neuro: alert/ nl sensorium/ no motor def      CXR PA and Lateral:   09/11/2016 :    I personally reviewed images and agree with radiology impression as follows:    Chronic changes without acute abnormality.   Labs ordered/ reviewed:      Chemistry      Component Value Date/Time   NA 140 09/11/2016 1012   K 4.0 09/11/2016 1012   CL 103 09/11/2016 1012   CO2 29 09/11/2016 1012   BUN 17 09/11/2016 1012   CREATININE 1.61 (H) 09/11/2016 1012   CREATININE 1.33 10/13/2011 1653      Component Value Date/Time   CALCIUM 9.5 09/11/2016 1012   ALKPHOS 60 02/11/2016 1056   AST 13 02/11/2016 1056   ALT 9 02/11/2016 1056   BILITOT 0.6 02/11/2016 1056        Lab Results  Component Value Date   WBC 9.3 09/11/2016   HGB 13.2 09/11/2016   HCT 39.4 09/11/2016   MCV 88.8 09/11/2016   PLT  175.0 09/11/2016       Lab Results  Component Value Date   TSH 2.36 09/11/2016     Lab Results  Component Value Date   PROBNP 803.0 (H) 09/11/2016

## 2016-09-11 NOTE — Progress Notes (Signed)
LMTCB

## 2016-09-14 NOTE — Assessment & Plan Note (Addendum)
Lab Results  Component Value Date   CREATININE 1.61 (H) 09/11/2016   CREATININE 1.63 (H) 04/15/2016   CREATININE 1.47 02/11/2016   CREATININE 1.33 10/13/2011    Unchanged x last 6 months - may need to change to clinoril and stop anaprox next step

## 2016-09-14 NOTE — Assessment & Plan Note (Signed)
-   new onset early June 2015  - 11/08/2013  Walked RA x 3 laps @ 185 ft each stopped due to  End of study, mild sob, no cp and no desat, EKG ok  - Cardiac w/u inconclusive but not clearly having ischemia  11/24/13   - 09/11/2016  Walked RA x 3 laps @ 185 ft each stopped due to  End of study, nl pace, no sob or desat   - Spirometry 09/11/2016  No obstruction     Main finding today is elevated bnp and creat > has f/u with cards planned and problem is chronic so no change in rx in interim needed unless symptoms worsen but could rx bp more aggressively if needed.

## 2016-09-14 NOTE — Assessment & Plan Note (Signed)
-   onset  Late sept 2017 rx zpak  - Rx levaquin 03/31/2016 > improved 04/15/2016  - Allergy profile 04/15/2016 >  Eos 0.5 /  IgE  20 neg RAST - Sinus CT 04/18/2016 > Mild polypoid mucosal thickening of bilateral ethmoid sinuses, likely representing chronic sinusitis. - Spirometry 09/11/2016  wnl   Most likely on the basis of low grade non-specific rhinitis/pnds > continue flonase, consider adding astelin next   I had an extended discussion with the patient reviewing all relevant studies completed to date and  lasting 15 to 20 minutes of a 25 minute visit    Each maintenance medication was reviewed in detail including most importantly the difference between maintenance and prns and under what circumstances the prns are to be triggered using an action plan format that is not reflected in the computer generated alphabetically organized AVS.    Please see AVS for specific instructions unique to this visit that I personally wrote and verbalized to the the pt in detail and then reviewed with pt  by my nurse highlighting any  changes in therapy recommended at today's visit to their plan of care.

## 2016-09-14 NOTE — Assessment & Plan Note (Signed)
In absence of airflow obst ok to use higher dose BB if pulse will tolerate but defer to cards with f/u now overdue> advised

## 2016-09-16 ENCOUNTER — Ambulatory Visit (INDEPENDENT_AMBULATORY_CARE_PROVIDER_SITE_OTHER): Payer: Medicare Other | Admitting: Internal Medicine

## 2016-09-16 ENCOUNTER — Encounter: Payer: Self-pay | Admitting: Internal Medicine

## 2016-09-16 VITALS — BP 134/78 | HR 68 | Ht 70.5 in | Wt 169.0 lb

## 2016-09-16 DIAGNOSIS — R9431 Abnormal electrocardiogram [ECG] [EKG]: Secondary | ICD-10-CM

## 2016-09-16 DIAGNOSIS — R0609 Other forms of dyspnea: Secondary | ICD-10-CM

## 2016-09-16 DIAGNOSIS — I5033 Acute on chronic diastolic (congestive) heart failure: Secondary | ICD-10-CM

## 2016-09-16 DIAGNOSIS — R0600 Dyspnea, unspecified: Secondary | ICD-10-CM

## 2016-09-16 NOTE — Patient Instructions (Addendum)
Medication Instructions:  Your physician recommends that you continue on your current medications as directed. Please refer to the Current Medication list given to you today.   Labwork: None Ordered   Testing/Procedures: Your physician has requested that you have a stress echocardiogram - modified bruce protocol   Follow-Up: Follow-up based on results of stress echo.   Any Other Special Instructions Will Be Listed Below (If Applicable).     If you need a refill on your cardiac medications before your next appointment, please call your pharmacy.

## 2016-09-16 NOTE — Progress Notes (Signed)
HPI Isaac [name] returns today for followup. He is a pleasant 76 yo man with CAD, s/p CABG 14 years ago, HTN, dyslipidemia, and underlying lung disease. He notes that he has had worsening sob, particularly if he walks up any incline. No peripheral edema. Previously he has had normal LV function. He is known to have 2 occluded vein grafts. He denies angina. He has a chronic cough but non-productive. He denies medical or dietary compliance problems. No other complaints.  No Known Allergies   Current Outpatient Prescriptions  Medication Sig Dispense Refill  . aspirin (ADULT ASPIRIN EC LOW STRENGTH) 81 MG EC tablet Take 81 mg by mouth every morning.     . Cholecalciferol (VITAMIN D) 2000 UNITS tablet Take 2,000 Units by mouth daily.     [name] docusate sodium (COLACE) 100 MG capsule Take 1 capsule by mouth every other day    . doxazosin (CARDURA) 4 MG tablet TAKE 1 TABLET BY MOUTH IN THE MORNING AND 1 TABLET AT BEDTIME    . fluticasone (FLONASE) 50 MCG/ACT nasal spray Place 2 sprays into both nostrils daily.    . furosemide (LASIX) 40 MG tablet Take 40 mg by mouth daily as needed for fluid or edema.    . isosorbide mononitrate (IMDUR) 30 MG 24 hr tablet Take 30 mg by mouth daily.    [name] KLOR-CON M20 20 MEQ tablet TAKE ONE TABLET BY MOUTH ONCE DAILY WHEN  YOU  TAKE LASIX 30 tablet 3  . metoprolol succinate (TOPROL-XL) 50 MG 24 hr tablet TAKE ONE TABLET BY MOUTH IN THE MORNING AND ONE-HALF IN THE EVENING 45 tablet 5  . Multiple Vitamin (MULTIVITAMIN WITH MINERALS) TABS Take 1 tablet by mouth daily.     . multivitamin-lutein (OCUVITE-LUTEIN) CAPS capsule Take 1 capsule by mouth daily.    [name] MYRBETRIQ 50 MG TB24 tablet Take 1 tablet by mouth daily.    . naproxen sodium (ANAPROX) 220 MG tablet Take as directed    . nitroGLYCERIN (NITROSTAT) 0.4 MG SL tablet Place 1 tablet (0.4 mg total) under the tongue every 5 (five) minutes as needed for chest pain (MAX 3 TABLETS). 10 tablet 2  . omeprazole (PRILOSEC)  20 MG capsule Take 20 mg by mouth 2 (two) times daily.    [name] oxymetazoline (AFRIN) 0.05 % nasal spray Place 2 sprays into the nose every 12 (twelve) hours as needed for congestion (x3 days). Dryness    . Simethicone (GAS-X EXTRA STRENGTH) 125 MG CAPS Take by mouth as directed. Reported on 10/10/2015    . simvastatin (ZOCOR) 20 MG tablet Take 1 tablet (20 mg total) by mouth at bedtime. 90 tablet 0   No current facility-administered medications for this visit.      Past Medical History:  Diagnosis Date  . Allergic rhinitis   . Allergy   . CAD (coronary artery disease) of artery bypass graft, Occluded VG-non dominant LCX medical therapy 11/25/2013  . GERD (gastroesophageal reflux disease)   . H/O hiatal hernia   . History of skin cancer    bilateral arms with removal  . Hyperlipidemia   . Hypertension   . Ischemic heart disease   . Lumbar disc disease   . Myocardial infarction ([place]) 2002    ROS:   All systems reviewed and negative except as noted in the HPI.   Past Surgical History:  Procedure Laterality Date  . CATARACT EXTRACTION Bilateral 1995,2000  . COLONOSCOPY    . CORONARY ARTERY BYPASS GRAFT  07/29/2000   LIMA-LAD, SVG-Diag, SVG-RCA  . GROIN DISSECTION  01/23/2012   Procedure: Virl Son EXPLORATION;  Surgeon: Harl Bowie, MD;  Location: [facility];  Service: General;  Laterality: Left;  Left Inguinal Exploration, release of scar tissue, Placement of Mesh  . INGUINAL HERNIA REPAIR Left 1983  . LEFT HEART CATHETERIZATION WITH CORONARY ANGIOGRAM N/A 11/24/2013   Procedure: LEFT HEART CATHETERIZATION WITH CORONARY ANGIOGRAM;  Surgeon: Peter M Martinique, MD;  Location: [facility] CATH LAB;  Service: Cardiovascular;  Laterality: N/A;  . NOSE SURGERY  1985     Family History  Problem Relation Age of Onset  . Breast cancer Mother   . Diabetes Mother   . Hypertension Father   . Dementia Father   . Diabetes Sister   . COPD Brother   . Colon cancer Neg Hx      Social History   Social  History  . Marital status: Married    Spouse name: N/A  . Number of children: 0  . Years of education: N/A   Occupational History  . Retired [place]  . Smoking status: Never Smoker  . Smokeless tobacco: Former Systems developer    Types: Chew    Quit date: 04/28/2014  . Alcohol use No  . Drug use: No  . Sexual activity: No   Other Topics Concern  . Not on file   Social History Narrative   Remarried in 07/30/2003 after first wife died of emphysema in 2001/07/29. Retired Architect.  Drives school bus.      BP 134/78   Pulse 68   Ht 5' 10.5" (1.791 m)   Wt 169 lb (76.7 kg)   SpO2 98%   BMI 23.91 kg/m   Physical Exam:  Stable 76 yo man, appearing in NAD HEENT: Unremarkable Neck:  7 cm JVD, no thyromegally Lymphatics:  No adenopathy Back:  No CVA tenderness Lungs:  Clear with decreased breath sounds but no wheezes. HEART:  Regular rate rhythm, no murmurs, no rubs, no clicks Abd:  soft, positive bowel sounds, no organomegally, no rebound, no guarding Ext:  2 plus pulses, no edema, no cyanosis, no clubbing Skin:  No rashes no nodules Neuro:  CN II through XII intact, motor grossly intact  EKG - NSR with non-specific T wave abnormalilty.   Assess/Plan: 1. Sob with exertion - today he walked around the office and did not drop his oxygen saturation. He will undergo ongoing pulmonary eval. I am concerned that he has either developed worsening LV dysfunction or is developing ischemic induced diastolic dysfunction. I have recommended he undergo exerecise testing with a stress echo did look at the physiologic burden of his ischemia. I would envision referral for cath if he has an abnormal stress echo. I would anticipate increasing his diuretic, particularly if the stress echo is negative.  2. CAD - he has never had angina. He has an abnormal ECG. I have asked that the patient undergo stress testing. He will continue his current meds for now. 3. Lung  disease - his PFT's are pending. He did not drop his sats today while walking around our office on room air. 4. HTN heart disease - his blood pressure is reasonably well controlled. Will follow.  Isaac [name].D.

## 2016-10-02 ENCOUNTER — Telehealth (HOSPITAL_COMMUNITY): Payer: Self-pay | Admitting: *Deleted

## 2016-10-02 NOTE — Telephone Encounter (Signed)
Left message on voicemail per DPR in reference to upcoming appointment scheduled on 10/07/16 at 2:30 with detailed instructions given per Stress Test Requisition Sheet for the test. LM to arrive 30 minutes early, and that it is imperative to arrive on time for appointment to keep from having the test rescheduled. If you need to cancel or reschedule your appointment, please call the office within 24 hours of your appointment. Failure to do so may result in a cancellation of your appointment, and a $50 no show fee. Phone number given for call back for any questions. Glade Lloyd

## 2016-10-07 ENCOUNTER — Ambulatory Visit (HOSPITAL_COMMUNITY): Payer: Medicare Other | Attending: Cardiology

## 2016-10-07 ENCOUNTER — Ambulatory Visit (HOSPITAL_BASED_OUTPATIENT_CLINIC_OR_DEPARTMENT_OTHER): Payer: Medicare Other

## 2016-10-07 DIAGNOSIS — R0989 Other specified symptoms and signs involving the circulatory and respiratory systems: Secondary | ICD-10-CM

## 2016-10-07 DIAGNOSIS — R9431 Abnormal electrocardiogram [ECG] [EKG]: Secondary | ICD-10-CM | POA: Insufficient documentation

## 2016-12-12 ENCOUNTER — Encounter: Payer: Self-pay | Admitting: Internal Medicine

## 2016-12-12 ENCOUNTER — Ambulatory Visit (INDEPENDENT_AMBULATORY_CARE_PROVIDER_SITE_OTHER): Payer: Medicare Other | Admitting: Internal Medicine

## 2016-12-12 VITALS — BP 132/78 | HR 65 | Ht 70.5 in | Wt 169.0 lb

## 2016-12-12 DIAGNOSIS — N184 Chronic kidney disease, stage 4 (severe): Secondary | ICD-10-CM | POA: Diagnosis not present

## 2016-12-12 DIAGNOSIS — M1990 Unspecified osteoarthritis, unspecified site: Secondary | ICD-10-CM | POA: Diagnosis not present

## 2016-12-12 DIAGNOSIS — I10 Essential (primary) hypertension: Secondary | ICD-10-CM | POA: Diagnosis not present

## 2016-12-12 MED ORDER — SULINDAC 200 MG PO TABS: ORAL_TABLET | ORAL | 11 refills | Status: DC

## 2016-12-12 NOTE — Patient Instructions (Addendum)
Stop the anaprox / try clinoril 200 mg (sulindac)  Up to twice daily with meals as needed for joint pain and call for orthopedic referral if not better in two weeks   Please see patient coordinator before you leave today  to schedule internal medicine with Dr Tawanna Sat group in [place]    If you are satisfied with your treatment plan,  let your doctor know and he/she can either refill your medications or you can return here when your prescription runs out.     If in any way you are not 100% satisfied,  please tell us.  If 100% better, tell your friends!  Pulmonary follow up is as needed

## 2016-12-12 NOTE — Assessment & Plan Note (Signed)
Trial of sulindac and refer to ortho prn   .I had an extended summary  discussion with the patient reviewing all relevant studies completed to date and  lasting 15 to 20 minutes of a 25 minute visit    Each maintenance medication was reviewed in detail including most importantly the difference between maintenance and prns and under what circumstances the prns are to be triggered using an action plan format that is not reflected in the computer generated alphabetically organized AVS but trather by a customized med calendar that reflects the AVS meds with confirmed 100% correlation.   In addition, Please see AVS for unique instructions that I personally wrote and verbalized to the the pt in detail and then reviewed with pt  by my nurse highlighting any  changes in therapy recommended at today's visit to their plan of care.    Needs to establish with local pcp as no longer has any need for pulmonary f/u and all cards f/u per Dr Lovena Le

## 2016-12-12 NOTE — Assessment & Plan Note (Signed)
Lab Results  Component Value Date   CREATININE 1.61 (H) 09/11/2016   CREATININE 1.63 (H) 04/15/2016   CREATININE 1.47 02/11/2016   CREATININE 1.33 10/13/2011     D/c aleve/ rx clinoril, f/u w/in 3  Months when establishes with PCP in [place]

## 2016-12-12 NOTE — Progress Notes (Signed)
Subjective:    Patient ID: Isaac [name], male    DOB: 1940/06/02   MRN: [id number]    Brief patient profile:  10  yowm never smoker  with h/o IHD/HBP/hyperlipidemia followed in pulmonary clinic for Primary care.   History of Present Illness   06/30/2011 f/u ov/Stefany Starace cc mostly dry cough x one month then new onset LLQ abd pain daily not present sleeping but daily waxes and wanes sev hours at a time no obvious trigger with activity or meals/flatus/ bms rec Prednisone 10 mg take  4 each am x 2 days,   2 each am x 2 days,  1 each am x2days and stop For cough use delsym cough syrup and stop fish oil until no longer coughing at all Try prilosec 20mg   Take 30-60 min before first meal of the day and Pepcid 20 mg one bedtime .  GERD diet  07/14/2011 f/u ov/Isaac [name] cough gone but  cc persistent x one month LLQ pain  Rad across ant abd to naval area plus difficulty with urination x 5 days  But no flank pain, hematuria, dysuria or use of otcs/antihistamine.  Abd pain waxes and wanes worse for maybe an hour then better but always present a little and not worse coughing. No pattern of pain in terms of alleviating or exacerbating.  >>CT abd pelvis neg > referred to [name]> corrected with L Inguinal sugery 12/2011     05/21/2012 f/u ov/Isaac [name] for f/u hbp cc fell off ladder x one month, better on nsaids still working remodeling, no cp or sob tia or claudication symptoms rec No change rx   August 05 2012 R shoulder surgery outpt   10/27/2014 f/u ov/Isaac [name] re: hbp/ chronic cough / cp / djd Chief Complaint  Patient presents with  . 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     Note last note by Dr Isaac [name] 03/28/14:  back pain with exertion which is his anginal equivalent. He will continue  his current medications. I've encouraged the patient to take a nitroglycerin. He is also encouraged to continue exercising daily. No change in medications at this point. 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     02/11/2016  f/u ov/Isaac [name] re: hbp/ hyperlipidemia new cough since late sept 2017  Chief Complaint  Patient presents with  . Follow-up    Pt c/o cough x 3 wks- prod with white to green sputum.    cough p stirs/ drinks coffee not tpically nocturnal/ assoc with nasal congestion "like a head cold" but not sob Isaac [name] rec zpak  For cough > mucinex dm up to 1200 mg every 12 hours as needed   03/31/2016 acute extended ov/Isaac [name] re: cough x late sept/ early oct 2017 on ppi bid  Chief Complaint  Patient presents with  . Acute Visit    chest tightness, non prod cough and sore throat for the past 3 wks.    some better p last ov and went to [place] on Nov 17th where acutely ill with worse  cough/ chills/ sore chest with coughing  rec Levaquin 500 mg daily x 7 days For cough > mucinex dm 1200 mg every 12 hours as needed  If still cough or hurting when you cough add Tylenol #3 one half to a whole every 4 hours as needed  Please remember to go to the x-ray department ? pna > rx x    levaquin 500 x 7 days        04/15/2016  f/u ov/Isaac [name] re:  ? pna / chronic cough / hbp  Chief Complaint  Patient presents with  . Follow-up    for PNA. Pt. states he feels a lot better than last month. Breathing has been ok.   rec Please see patient coordinator before you leave today  to schedule sinus CT > chronic changes Allergy profile > neg     09/11/2016  f/u ov/Isaac [name] re:  Chief Complaint  Patient presents with  . Follow-up    Still coughing with white sputum.  He has good days and bad with his breathing.    cough is worse p  breakfast x 1 tbsp and rest day ok  Sleep ok  Mailbox is 250 feet up hill and has to stop some days new since pna  rec You are overdue for cardiology follow up, call Dr Tanna Furry office to arrange it       12/12/2016  f/u ov/Isaac [name] re: HBP/CRI  And new R back/hip pain  Chief Complaint  Patient presents with  . Follow-up    Pt states that his breathing has been doing good. Only complaint is that he has had some back pain. Denies any cough, SOB, or CP.  no longer limited by sob uphill from mb to house but new R low back pain rad to R hip, did not try aleve on med calendar - no radiation to lower portion of thigh/calf/foot and no assoc numbness or rash. Better with ace/ heat  No obvious day to day or daytime variability or assoc excess/ purulent sputum or mucus plugs or hemoptysis or cp or chest tightness, subjective wheeze or overt sinus or hb symptoms. No unusual exp hx or h/o childhood pna/ asthma or knowledge of premature birth.  Sleeping ok without nocturnal  or early am exacerbation  of respiratory  c/o's or need for noct saba. Also denies any obvious fluctuation of symptoms with weather or environmental changes or other aggravating or alleviating factors except as outlined above   Current Medications, Allergies, Complete Past Medical History, Past Surgical History, Family History, and Social History were reviewed in [employer] record.  ROS  The following are not active complaints unless bolded sore throat, dysphagia, dental problems, itching, sneezing,  nasal congestion or excess/ purulent secretions, ear ache,   fever, chills, sweats, unintended wt loss, classically pleuritic or exertional cp,  orthopnea pnd or leg swelling, presyncope, palpitations, abdominal pain, anorexia, nausea, vomiting, diarrhea  or change in bowel or bladder habits, change in stools or urine, dysuria,hematuria,  rash, arthralgias, visual complaints, headache, numbness, weakness or ataxia or  problems with walking or coordination,  change in mood/affect or memory.               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 Isaac [name] (presented as sudden death) ARTHRALGIA (ICD-719.40)  HYPERTENSION (ICD-401.9)  HYPERLIPIDEMIA (ICD-272.4)  ALLERGIC RHINITIS (ICD-477.9)  L IH repair 1984 GERD/ Barrett's............................................................................[name] [facility]  LLQ abd pain onset 05/2011...........................................................[name] [name] -  01/23/12 scar tissue removed >  pain resolved  HEALTH MAINTENANCE.................................................................Marland KitchenWert  -Td 05/2004,  05/30/2014  -Pneumovax 03/2005 , Prevnar 03/28/2014  -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.........................................Marland KitchenOlin             Objective:   Physical Exam   Ambulatory healthy appearing mildly hoarse wm  nad / vital signs reviewed  - Note on arrival 02 sats  99% on RA  And bp 132/78         wt   192 April 09, 2009 > 190 July 21, 2009 > 187 03/26/2012  > 05/21/2012 187 > 09/06/2012  189 > 12/06/12 192>  189 03/07/2013  > 11/08/2013 191 >  02/10/2014 199 >192 03/28/2014 > 05/30/2014   192 > 10/26/2014    170 > 05/22/2015  168 >174 06/05/2015 >  02/11/2016   166  > 03/31/2016  168 > 04/16/2016  161 > 09/11/2016   168 > 12/12/2016 169     HEENT: edentulous  nl turbinates, and oropharynx  Neck without JVD/Nodes/TM  Lungs clear to A and P bilaterally without cough on insp or exp maneuvers  RRR no s3 or murmur or increase in P2   LLE  pulses min  reduced vs R , no edema Abd soft , no tenderness,  organomegaly  EXT no edema,  , nl pulses  MS  Nl gain s deformity  - mild restriction R hip with int rotation, neg slr  Neuro: alert/ nl sensorium/ no motor def or path reflexes        Labs  reviewed:      Chemistry      Component Value Date/Time   NA 140 09/11/2016 1012   K 4.0  09/11/2016 1012   CL 103 09/11/2016 1012   CO2 29 09/11/2016 1012   BUN 17 09/11/2016 1012   CREATININE 1.61 (H) 09/11/2016 1012   CREATININE 1.33 10/13/2011 1653      Component Value Date/Time   CALCIUM 9.5 09/11/2016 1012   ALKPHOS 60 02/11/2016 1056   AST 13 02/11/2016 1056   ALT 9 02/11/2016 1056   BILITOT 0.6 02/11/2016 1056        Lab Results  Component Value Date   WBC 9.3 09/11/2016   HGB 13.2 09/11/2016   HCT 39.4 09/11/2016   MCV 88.8 09/11/2016   PLT 175.0 09/11/2016       Lab Results  Component Value Date   TSH 2.36 09/11/2016     Lab Results  Component Value Date   PROBNP 803.0 (H) 09/11/2016

## 2016-12-12 NOTE — Assessment & Plan Note (Signed)
Adequate control on present rx, reviewed in detail with pt > no change in rx needed   

## 2017-01-02 ENCOUNTER — Encounter: Payer: Self-pay | Admitting: Family Medicine

## 2017-01-02 ENCOUNTER — Ambulatory Visit (INDEPENDENT_AMBULATORY_CARE_PROVIDER_SITE_OTHER): Payer: Medicare Other | Admitting: Family Medicine

## 2017-01-02 VITALS — BP 181/73 | HR 61 | Temp 97.3°F | Ht 70.5 in | Wt 171.0 lb

## 2017-01-02 DIAGNOSIS — E785 Hyperlipidemia, unspecified: Secondary | ICD-10-CM

## 2017-01-02 DIAGNOSIS — K219 Gastro-esophageal reflux disease without esophagitis: Secondary | ICD-10-CM | POA: Diagnosis not present

## 2017-01-02 DIAGNOSIS — Z23 Encounter for immunization: Secondary | ICD-10-CM | POA: Diagnosis not present

## 2017-01-02 DIAGNOSIS — I509 Heart failure, unspecified: Secondary | ICD-10-CM | POA: Diagnosis not present

## 2017-01-02 DIAGNOSIS — K22719 Barrett's esophagus with dysplasia, unspecified: Secondary | ICD-10-CM

## 2017-01-02 DIAGNOSIS — N183 Chronic kidney disease, stage 3 unspecified: Secondary | ICD-10-CM

## 2017-01-02 DIAGNOSIS — I200 Unstable angina: Secondary | ICD-10-CM | POA: Diagnosis not present

## 2017-01-02 DIAGNOSIS — I10 Essential (primary) hypertension: Secondary | ICD-10-CM

## 2017-01-02 NOTE — Progress Notes (Signed)
BP (!) 181/73   Pulse 61   Temp (!) 97.3 F (36.3 C) (Oral)   Ht 5' 10.5" (1.791 m)   Wt 171 lb (77.6 kg)   BMI 24.19 kg/m    Subjective:    Patient ID: Isaac [name], male    DOB: [birth year], 76 y.o.   MRN: [id number]  HPI: Isaac [name] is a 76 y.o. male presenting on 01/02/2017 for Establish Care (formerly saw pulmonologist for all primary care but medicare will no longer allow)   HPI Hyperlipidemia Patient is coming in as a new patient to establish care with her Patient is coming in for recheck of his hyperlipidemia. The patient is currently taking simvastatin. They deny any issues with myalgias or history of liver damage from it. They deny any focal numbness or weakness or chest pain.   Hypertension Patient is currently on Imdur and metoprolol, and their blood pressure today is 181/73, patient does say that he has been out of his medication. Patient denies any lightheadedness or dizziness. Patient denies headaches, blurred vision, chest pains, shortness of breath, or weakness. Denies any side effects from medication and is content with current medication.   GERD Patient is currently on omeprazole.  he denies any major symptoms or abdominal pain or belching or burping. he denies any blood in her stool or lightheadedness or dizziness. Patient has known Barrett's esophagus and does not currently have a gastroenterologist here and we will get him 1.  Angina and congestive heart failure Patient has been diagnosed with congestive heart failure and has been diagnosed with unstable angina and has a cardiologist that he follows for this. He also has nitroglycerin which she has used only sporadically at this point. Today he denies any major swelling or shortness of breath. He does take daily weights and has been consistently around the same weight. He has not had any symptoms at least the past week and denies any shortness of breath or chest pain currently.  Stage III CKD Patient has known  stage III kidney disease and currently does have a nephrologist that he sees. It has been stable for some time but they're monitoring it closely at this point. He denies any difficulty urinating or symptomatic issues from the kidney disease  Relevant past medical, surgical, family and social history reviewed and updated as indicated. Interim medical history since our last visit reviewed. Allergies and medications reviewed and updated.  Review of Systems  Constitutional: Negative for chills and fever.  HENT: Negative for ear pain and tinnitus.   Eyes: Negative for pain and discharge.  Respiratory: Negative for cough, shortness of breath and wheezing.   Cardiovascular: Negative for chest pain, palpitations and leg swelling.  Gastrointestinal: Negative for abdominal pain, blood in stool, constipation and diarrhea.  Genitourinary: Negative for dysuria and hematuria.  Musculoskeletal: Negative for back pain, gait problem and myalgias.  Skin: Negative for rash.  Neurological: Negative for dizziness, weakness and headaches.  Psychiatric/Behavioral: Negative for suicidal ideas.  All other systems reviewed and are negative.   Per HPI unless specifically indicated above  Social History   Social History  . Marital status: Married    Spouse name: N/A  . Number of children: 0  . Years of education: N/A   Occupational History  . Retired [employer]  . Smoking status: Never Smoker  . Smokeless tobacco: Former Systems developer    Types: Chew    Quit date: 04/28/2014  . Alcohol  use No  . Drug use: No  . Sexual activity: No   Other Topics Concern  . Not on file   Social History Narrative   Remarried in 08-12-03 after first wife died of emphysema in 08-11-2001. Retired Architect.  Drives school bus.     Past Surgical History:  Procedure Laterality Date  . CATARACT EXTRACTION Bilateral 08/11/1993  . COLONOSCOPY    . 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 GROIN DISSECTION  01/23/2012   Procedure: Virl Son EXPLORATION;  Surgeon: Harl Bowie, MD;  Location: [facility];  Service: General;  Laterality: Left;  Left Inguinal Exploration, release of scar tissue, Placement of Mesh  . INGUINAL HERNIA REPAIR Left 1983  . LEFT HEART CATHETERIZATION WITH CORONARY ANGIOGRAM N/A 11/24/2013   Procedure: LEFT HEART CATHETERIZATION WITH CORONARY ANGIOGRAM;  Surgeon: Peter M Martinique, MD;  Location: [facility] CATH LAB;  Service: Cardiovascular;  Laterality: N/A;  . NOSE SURGERY  1985    Family History  Problem Relation Age of Onset  . Breast cancer Mother   . Diabetes Mother   . Hypertension Father   . Dementia Father   . Diabetes Sister   . COPD Brother   . Colon cancer Neg Hx     Allergies as of 01/02/2017   No Known Allergies     Medication List       Accurate as of 01/02/17  9:47 AM. Always use your most recent med list.          ADULT ASPIRIN EC LOW STRENGTH 81 MG EC tablet Generic drug:  aspirin Take 81 mg by mouth every morning.   docusate sodium 100 MG capsule Commonly known as:  COLACE Take 1 capsule by mouth every other day   doxazosin 4 MG tablet Commonly known as:  CARDURA TAKE 1 TABLET BY MOUTH IN THE MORNING AND 1 TABLET AT BEDTIME   fluticasone 50 MCG/ACT nasal spray Commonly known as:  FLONASE Place 2 sprays into both nostrils daily.   furosemide 40 MG tablet Commonly known as:  LASIX Take 40 mg by mouth daily as needed for fluid or edema.   GAS-X EXTRA STRENGTH 125 MG Caps Generic drug:  Simethicone Take by mouth as directed. Reported on 10/10/2015   isosorbide mononitrate 30 MG 24 hr tablet Commonly known as:  IMDUR Take 30 mg by mouth daily.   KLOR-CON M20 20 MEQ tablet Generic drug:  potassium chloride SA TAKE ONE TABLET BY MOUTH ONCE DAILY WHEN  YOU  TAKE LASIX   metoprolol succinate 50 MG 24 hr tablet Commonly known as:  TOPROL-XL TAKE ONE TABLET BY MOUTH IN THE MORNING AND ONE-HALF IN THE  EVENING   multivitamin with minerals Tabs tablet Take 1 tablet by mouth daily.   multivitamin-lutein Caps capsule Take 1 capsule by mouth daily.   MYRBETRIQ 50 MG Tb24 tablet Generic drug:  mirabegron ER Take 1 tablet by mouth daily.   nitroGLYCERIN 0.4 MG SL tablet Commonly known as:  NITROSTAT Place 1 tablet (0.4 mg total) under the tongue every 5 (five) minutes as needed for chest pain (MAX 3 TABLETS).   omeprazole 20 MG capsule Commonly known as:  PRILOSEC Take 20 mg by mouth 2 (two) times daily.   oxymetazoline 0.05 % nasal spray Commonly known as:  AFRIN Place 2 sprays into the nose every 12 (twelve) hours as needed for congestion (x3 days). Dryness   simvastatin 20 MG tablet Commonly known as:  ZOCOR  Take 1 tablet (20 mg total) by mouth at bedtime.   sulindac 200 MG tablet Commonly known as:  CLINORIL Take up to twice daily with meals as needed for joint pain   Vitamin D 2000 units tablet Take 2,000 Units by mouth daily.            Discharge Care Instructions        Start     Ordered   01/02/17 0000  Pneumococcal polysaccharide vaccine 23-valent greater than or equal to 2yo subcutaneous/IM     01/02/17 0858   01/02/17 0000  Ambulatory referral to Gastroenterology     01/02/17 0936         Objective:    BP (!) 181/73   Pulse 61   Temp (!) 97.3 F (36.3 C) (Oral)   Ht 5' 10.5" (1.791 m)   Wt 171 lb (77.6 kg)   BMI 24.19 kg/m   Wt Readings from Last 3 Encounters:  01/02/17 171 lb (77.6 kg)  12/12/16 169 lb (76.7 kg)  09/16/16 169 lb (76.7 kg)    Physical Exam  Constitutional: He is oriented to person, place, and time. He appears well-developed and well-nourished. No distress.  Eyes: Conjunctivae are normal. No scleral icterus.  Neck: Neck supple. No thyromegaly present.  Cardiovascular: Normal rate, regular rhythm, normal heart sounds and intact distal pulses.   No murmur heard. Pulmonary/Chest: Effort normal and breath sounds normal. No  respiratory distress. He has no wheezes. He has no rales.  Abdominal: Soft. Bowel sounds are normal. He exhibits no distension. There is no tenderness. There is no rebound.  Musculoskeletal: Normal range of motion. He exhibits no edema.  Lymphadenopathy:    He has no cervical adenopathy.  Neurological: He is alert and oriented to person, place, and time. Coordination normal.  Skin: Skin is warm and dry. No rash noted. He is not diaphoretic.  Psychiatric: He has a normal mood and affect. His behavior is normal.  Nursing note and vitals reviewed.   Results for orders placed or performed in visit on 09/11/16  CBC with Differential/Platelet  Result Value Ref Range   WBC 9.3 4.0 - 10.5 K/uL   RBC 4.43 4.22 - 5.81 Mil/uL   Hemoglobin 13.2 13.0 - 17.0 g/dL   HCT 39.4 39.0 - 52.0 %   MCV 88.8 78.0 - 100.0 fl   MCHC 33.5 30.0 - 36.0 g/dL   RDW 15.7 (H) 11.5 - 15.5 %   Platelets 175.0 150.0 - 400.0 K/uL   Neutrophils Relative % 62.0 43.0 - 77.0 %   Lymphocytes Relative 22.0 12.0 - 46.0 %   Monocytes Relative 9.1 3.0 - 12.0 %   Eosinophils Relative 5.7 (H) 0.0 - 5.0 %   Basophils Relative 1.2 0.0 - 3.0 %   Neutro Abs 5.8 1.4 - 7.7 K/uL   Lymphs Abs 2.1 0.7 - 4.0 K/uL   Monocytes Absolute 0.8 0.1 - 1.0 K/uL   Eosinophils Absolute 0.5 0.0 - 0.7 K/uL   Basophils Absolute 0.1 0.0 - 0.1 K/uL  Brain natriuretic peptide  Result Value Ref Range   Pro B Natriuretic peptide (BNP) 803.0 (H) 0.0 - 100.0 pg/mL  Basic metabolic panel  Result Value Ref Range   Sodium 140 135 - 145 mEq/L   Potassium 4.0 3.5 - 5.1 mEq/L   Chloride 103 96 - 112 mEq/L   CO2 29 19 - 32 mEq/L   Glucose, Bld 81 70 - 99 mg/dL   BUN 17 6 - 23  mg/dL   Creatinine, Ser 1.61 (H) 0.40 - 1.50 mg/dL   Calcium 9.5 8.4 - 10.5 mg/dL   GFR 44.60 (L) >60.00 mL/min  TSH  Result Value Ref Range   TSH 2.36 0.35 - 4.50 uIU/mL      Assessment & Plan:   Problem List Items Addressed This Visit      Cardiovascular and Mediastinum    Unstable angina (HCC)   Essential hypertension   CHF (congestive heart failure), NYHA class III (HCC)     Digestive   GERD (gastroesophageal reflux disease) (Chronic)   Relevant Orders   Ambulatory referral to Gastroenterology   Barrett's esophagus (Chronic)     Genitourinary   Stage 3 chronic kidney disease     Other   Hyperlipidemia LDL goal <70 - Primary (Chronic)     No medication changes currently, did referral for gastroenterology because of previous Barrett's. He just had labs done through his pulmonology so we will wait to check them until next visit.  Follow up plan: Return in about 6 months (around 07/02/2017), or if symptoms worsen or fail to improve, for htn, hyperlipidemia.  Caryl Pina, MD [facility] 01/02/2017, 9:47 AM

## 2017-01-21 ENCOUNTER — Ambulatory Visit (INDEPENDENT_AMBULATORY_CARE_PROVIDER_SITE_OTHER): Payer: Medicare Other

## 2017-01-21 ENCOUNTER — Other Ambulatory Visit: Payer: Self-pay | Admitting: *Deleted

## 2017-01-21 DIAGNOSIS — Z23 Encounter for immunization: Secondary | ICD-10-CM

## 2017-01-21 MED ORDER — METOPROLOL SUCCINATE ER 50 MG PO TB24: ORAL_TABLET | ORAL | 5 refills | Status: DC

## 2017-02-04 ENCOUNTER — Encounter: Payer: Self-pay | Admitting: Family Medicine

## 2017-04-15 ENCOUNTER — Encounter: Payer: Self-pay | Admitting: Pediatrics

## 2017-04-15 ENCOUNTER — Ambulatory Visit (INDEPENDENT_AMBULATORY_CARE_PROVIDER_SITE_OTHER): Payer: Medicare Other | Admitting: Pediatrics

## 2017-04-15 VITALS — BP 133/76 | HR 64 | Temp 96.7°F | Ht 70.5 in | Wt 167.4 lb

## 2017-04-15 DIAGNOSIS — K4090 Unilateral inguinal hernia, without obstruction or gangrene, not specified as recurrent: Secondary | ICD-10-CM | POA: Diagnosis not present

## 2017-04-15 DIAGNOSIS — I200 Unstable angina: Secondary | ICD-10-CM

## 2017-04-15 NOTE — Progress Notes (Signed)
  Subjective:   Patient ID: Isaac [name], male    DOB: 06-24-1940, 76 y.o.   MRN: [id number] CC: Hernia (Right Groin, 1 month)  HPI: Isaac [name] is a 76 y.o. male presenting for Hernia (Right Groin, 1 month)  For the last month has been bothered by bulging in his right inguinal area Because of some pains with certain movements, especially bending over Here today with his son  Patient thinks pain is getting worse, more significant when he does have it Happening every day Had a hernia repair on the left side No bulging ulcer in his abdomen Normal appetite, moving bowels regularly, no blood or pain, no nausea or vomiting The bulge is never hard or red  Relevant past medical, surgical, family and social history reviewed. Allergies and medications reviewed and updated. Social History   Tobacco Use  Smoking Status Never Smoker  Smokeless Tobacco Former Systems developer  . Types: Chew   ROS: Per HPI   Objective:    BP 133/76   Pulse 64   Temp (!) 96.7 F (35.9 C) (Oral)   Ht 5' 10.5" (1.791 m)   Wt 167 lb 6.4 oz (75.9 kg)   BMI 23.68 kg/m   Wt Readings from Last 3 Encounters:  04/15/17 167 lb 6.4 oz (75.9 kg)  01/02/17 171 lb (77.6 kg)  12/12/16 169 lb (76.7 kg)    Gen: NAD, alert, cooperative with exam, NCAT EYES: EOMI, no conjunctival injection, or no icterus ENT: OP without erythema LYMPH: no cervical LAD CV: NRRR, normal S1/S2, no murmur, distal pulses 2+ b/l Resp: CTABL, no wheezes, normal WOB Abd: +BS, soft, NTND. no guarding or organomegaly, soft, easily compressible right inguinal bulge with bowel sounds Ext: No edema, warm Neuro: Alert and oriented  Assessment & Plan:  Isaac [name] was seen today for hernia.  Diagnoses and all orders for this visit:  Non-recurrent unilateral inguinal hernia without obstruction or gangrene Increasingly symptomatic, patient would like evaluation and repair possible -     Ambulatory referral to General Surgery   Follow up plan: Return if  symptoms worsen or fail to improve. Assunta Found, MD [facility]

## 2017-04-24 ENCOUNTER — Telehealth: Payer: Self-pay | Admitting: Family Medicine

## 2017-04-24 NOTE — Telephone Encounter (Signed)
Spoke to pt and he wanted to know about his referral to General Surgery, advised we are waiting for them to call us and let us know when they have available appt, Debbi Boles just called yesterday to see if pt was scheduled. Pt states his pain level is a 10 out of 10. Advised pt if he is in that much pain he needs to go to the ED for further evaluation. Pt voiced understanding.

## 2017-04-26 ENCOUNTER — Emergency Department (HOSPITAL_COMMUNITY)
Admission: EM | Admit: 2017-04-26 | Discharge: 2017-04-26 | Disposition: A | Discharge status: Home / Self Care | Payer: Medicare Other | Attending: Emergency Medicine | Admitting: Emergency Medicine

## 2017-04-26 ENCOUNTER — Emergency Department (HOSPITAL_COMMUNITY): Payer: Medicare Other

## 2017-04-26 ENCOUNTER — Encounter (HOSPITAL_COMMUNITY): Payer: Self-pay | Admitting: Emergency Medicine

## 2017-04-26 DIAGNOSIS — I2510 Atherosclerotic heart disease of native coronary artery without angina pectoris: Secondary | ICD-10-CM | POA: Diagnosis not present

## 2017-04-26 DIAGNOSIS — R1031 Right lower quadrant pain: Secondary | ICD-10-CM | POA: Diagnosis present

## 2017-04-26 DIAGNOSIS — N183 Chronic kidney disease, stage 3 (moderate): Secondary | ICD-10-CM | POA: Diagnosis not present

## 2017-04-26 DIAGNOSIS — I130 Hypertensive heart and chronic kidney disease with heart failure and stage 1 through stage 4 chronic kidney disease, or unspecified chronic kidney disease: Secondary | ICD-10-CM | POA: Insufficient documentation

## 2017-04-26 DIAGNOSIS — Z7982 Long term (current) use of aspirin: Secondary | ICD-10-CM | POA: Diagnosis not present

## 2017-04-26 DIAGNOSIS — I129 Hypertensive chronic kidney disease with stage 1 through stage 4 chronic kidney disease, or unspecified chronic kidney disease: Secondary | ICD-10-CM | POA: Insufficient documentation

## 2017-04-26 DIAGNOSIS — K4090 Unilateral inguinal hernia, without obstruction or gangrene, not specified as recurrent: Secondary | ICD-10-CM | POA: Diagnosis not present

## 2017-04-26 DIAGNOSIS — Z79899 Other long term (current) drug therapy: Secondary | ICD-10-CM | POA: Diagnosis not present

## 2017-04-26 DIAGNOSIS — I252 Old myocardial infarction: Secondary | ICD-10-CM | POA: Insufficient documentation

## 2017-04-26 DIAGNOSIS — I509 Heart failure, unspecified: Secondary | ICD-10-CM | POA: Insufficient documentation

## 2017-04-26 LAB — CBC WITH DIFFERENTIAL/PLATELET
Basophils Absolute: 0.1 10*3/uL (ref 0.0–0.1)
Basophils Relative: 1 %
EOS ABS: 0.4 10*3/uL (ref 0.0–0.7)
EOS PCT: 5 %
HCT: 39.5 % (ref 39.0–52.0)
Hemoglobin: 13.4 g/dL (ref 13.0–17.0)
LYMPHS ABS: 2.5 10*3/uL (ref 0.7–4.0)
Lymphocytes Relative: 32 %
MCH: 31.9 pg (ref 26.0–34.0)
MCHC: 33.9 g/dL (ref 30.0–36.0)
MCV: 94 fL (ref 78.0–100.0)
MONOS PCT: 12 %
Monocytes Absolute: 0.9 10*3/uL (ref 0.1–1.0)
Neutro Abs: 3.9 10*3/uL (ref 1.7–7.7)
Neutrophils Relative %: 50 %
PLATELETS: 164 10*3/uL (ref 150–400)
RBC: 4.2 MIL/uL — ABNORMAL LOW (ref 4.22–5.81)
RDW: 13.4 % (ref 11.5–15.5)
WBC: 7.8 10*3/uL (ref 4.0–10.5)

## 2017-04-26 LAB — COMPREHENSIVE METABOLIC PANEL
ALK PHOS: 73 U/L (ref 38–126)
ALT: 15 U/L — ABNORMAL LOW (ref 17–63)
AST: 20 U/L (ref 15–41)
Albumin: 3.8 g/dL (ref 3.5–5.0)
Anion gap: 5 (ref 5–15)
BUN: 22 mg/dL — ABNORMAL HIGH (ref 6–20)
CALCIUM: 9.2 mg/dL (ref 8.9–10.3)
CO2: 27 mmol/L (ref 22–32)
Chloride: 105 mmol/L (ref 101–111)
Creatinine, Ser: 1.46 mg/dL — ABNORMAL HIGH (ref 0.61–1.24)
GFR, EST AFRICAN AMERICAN: 52 mL/min — AB (ref >60)
GFR, EST NON AFRICAN AMERICAN: 45 mL/min — AB (ref >60)
Glucose, Bld: 98 mg/dL (ref 65–99)
Potassium: 4.6 mmol/L (ref 3.5–5.1)
SODIUM: 137 mmol/L (ref 135–145)
Total Bilirubin: 0.7 mg/dL (ref 0.3–1.2)
Total Protein: 7.6 g/dL (ref 6.5–8.1)

## 2017-04-26 LAB — URINALYSIS, ROUTINE W REFLEX MICROSCOPIC
BILIRUBIN URINE: NEGATIVE
Glucose, UA: NEGATIVE mg/dL
HGB URINE DIPSTICK: NEGATIVE
Ketones, ur: NEGATIVE mg/dL
Leukocytes, UA: NEGATIVE
Nitrite: NEGATIVE
PH: 7 (ref 5.0–8.0)
Protein, ur: NEGATIVE mg/dL
SPECIFIC GRAVITY, URINE: 1.013 (ref 1.005–1.030)

## 2017-04-26 LAB — LIPASE, BLOOD: Lipase: 41 U/L (ref 11–51)

## 2017-04-26 MED ORDER — TRAMADOL HCL 50 MG PO TABS: 50.0000 mg | ORAL_TABLET | Freq: Four times a day (QID) | ORAL | 0 refills | Status: DC | PRN

## 2017-04-26 MED ORDER — IOPAMIDOL (ISOVUE-300) INJECTION 61%
INTRAVENOUS | Status: AC
  Administered 2017-04-26: 80 mL via INTRAVENOUS
  Filled 2017-04-26: qty 100

## 2017-04-26 MED ORDER — SODIUM CHLORIDE 0.9 % IV BOLUS (SEPSIS)
1000.0000 mL | Freq: Once | INTRAVENOUS | Status: AC
  Administered 2017-04-26: 1000 mL via INTRAVENOUS

## 2017-04-26 NOTE — ED Triage Notes (Signed)
Patient presents ambulatory stating he has a hernia that his PCP is trying to get him into surgery for however pain is becoming worse over past week and was referred to the ER.

## 2017-04-26 NOTE — ED Provider Notes (Signed)
[facility] DEPT Provider Note   CSN: [id number] Arrival date & time: 04/26/17  1131     History   Chief Complaint Chief Complaint  Patient presents with  . Hernia    HPI Isaac [name] is a 76 y.o. male.  HPI  Soreness right inguinal area, stinging, sharp knife like pain, has been present for 6 weeks,  Laying down and resting helps.  Worse with lifting, reaching over head makes it worse, picking up grandchild makes it worse.  Pain comes and goes.  Also has inguinal bulge.  Present for 6wk, gradually worsening over last 2 weeks and worse over last week.  Has not seen surgery physician yet and has not had CT.  Severe pain, doubles him over. No nausea or vomiting.  Having normal bowel movements.  No other abdominal pain other than area of right groin.  No fevers, no chills, no trouble urinating.    Past Medical History:  Diagnosis Date  . Allergic rhinitis   . Allergy   . CAD (coronary artery disease) of artery bypass graft, Occluded VG-non dominant LCX medical therapy 11/25/2013  . Cataract   . GERD (gastroesophageal reflux disease)   . H/O hiatal hernia   . History of skin cancer    bilateral arms with removal  . Hyperlipidemia   . Hypertension   . Ischemic heart disease   . Lumbar disc disease   . Myocardial infarction [facility]) 2002    Patient Active Problem List   Diagnosis Date Noted  . CAP (community acquired pneumonia) 04/16/2016  . Cerumen impaction 06/05/2015  . Stage 3 chronic kidney disease ([place]) 05/22/2015  . Arthritis 06/04/2014  . CHF (congestive heart failure), NYHA class III ([place]) 02/20/2014  . BPH (benign prostatic hyperplasia) 02/20/2014  . T wave inversion in EKG 02/20/2014  . CAD (coronary artery disease) of artery bypass graft, Occluded VG-non dominant LCX medical therapy 11/25/2013  . Essential hypertension   . Unstable angina ([place]) 11/23/2013  . Dyspnea on exertion 11/08/2013  . Barrett's esophagus 10/27/2011  .  GERD (gastroesophageal reflux disease) 07/14/2011  . CAD (coronary artery disease), native coronary artery - LIMA-LAD, SVG-Diag, SVG-RCA 2002   . Hyperlipidemia LDL goal <70   . Hypertensive heart disease     Past Surgical History:  Procedure Laterality Date  . CATARACT EXTRACTION Bilateral 1995,2000  . COLONOSCOPY    . CORONARY ARTERY BYPASS GRAFT  2002   LIMA-LAD, SVG-Diag, SVG-RCA  . GROIN DISSECTION  01/23/2012   Procedure: Virl Son EXPLORATION;  Surgeon: Harl Bowie, MD;  Location: [facility] ORS;  Service: General;  Laterality: Left;  Left Inguinal Exploration, release of scar tissue, Placement of Mesh  . INGUINAL HERNIA REPAIR Left 1983  . LEFT HEART CATHETERIZATION WITH CORONARY ANGIOGRAM N/A 11/24/2013   Procedure: LEFT HEART CATHETERIZATION WITH CORONARY ANGIOGRAM;  Surgeon: Peter M Martinique, MD;  Location: [facility] CATH LAB;  Service: Cardiovascular;  Laterality: N/A;  . NOSE SURGERY  1985       Home Medications    Prior to Admission medications   Medication Sig Start Date End Date Taking? Authorizing Provider  aspirin (ADULT ASPIRIN EC LOW STRENGTH) 81 MG EC tablet Take 81 mg by mouth every morning.    Yes [provider]  Cholecalciferol (VITAMIN D) 2000 UNITS tablet Take 2,000 Units by mouth daily.    Yes [provider]  docusate sodium (COLACE) 100 MG capsule Take 1 capsule by mouth every other day   Yes [provider]  doxazosin (CARDURA) 4 MG tablet TAKE 1 TABLET BY MOUTH IN THE MORNING AND 1 TABLET AT BEDTIME   Yes [provider]  fluticasone (FLONASE) 50 MCG/ACT nasal spray Place 2 sprays into both nostrils daily as needed for allergies or rhinitis.    Yes [provider]  furosemide (LASIX) 40 MG tablet Take 40 mg by mouth daily as needed for fluid or edema.   Yes [provider]  isosorbide mononitrate (IMDUR) 30 MG 24 hr tablet Take 30 mg by mouth daily.   Yes [provider]  KLOR-CON M20 20 MEQ tablet TAKE  ONE TABLET BY MOUTH ONCE DAILY WHEN  YOU  TAKE LASIX 02/16/15  Yes Tanda Rockers, MD  metoprolol succinate (TOPROL-XL) 50 MG 24 hr tablet TAKE ONE TABLET BY MOUTH IN THE MORNING AND ONE-HALF IN THE EVENING 01/21/17  Yes Dettinger, Fransisca Kaufmann, MD  Multiple Vitamin (MULTIVITAMIN WITH MINERALS) TABS Take 1 tablet by mouth daily.    Yes [provider]  multivitamin-lutein (OCUVITE-LUTEIN) CAPS capsule Take 1 capsule by mouth daily.   Yes [provider]  MYRBETRIQ 50 MG TB24 tablet Take 1 tablet by mouth daily. 10/09/15  Yes [provider]  nitroGLYCERIN (NITROSTAT) 0.4 MG SL tablet Place 1 tablet (0.4 mg total) under the tongue every 5 (five) minutes as needed for chest pain (MAX 3 TABLETS). 02/11/16  Yes Tanda Rockers, MD  omeprazole (PRILOSEC) 20 MG capsule Take 20 mg by mouth 2 (two) times daily.   Yes [provider]  oxymetazoline (AFRIN) 0.05 % nasal spray Place 2 sprays into the nose every 12 (twelve) hours as needed for congestion (x3 days). Dryness   Yes [provider]  Simethicone (GAS-X EXTRA STRENGTH) 125 MG CAPS Take by mouth as directed. Reported on 10/10/2015   Yes [provider]  simvastatin (ZOCOR) 20 MG tablet Take 1 tablet (20 mg total) by mouth at bedtime. 10/10/15  Yes Parrett, Fonnie Mu, NP  sulindac (CLINORIL) 200 MG tablet Take up to twice daily with meals as needed for joint pain 12/12/16  Yes Tanda Rockers, MD  traMADol (ULTRAM) 50 MG tablet Take 1 tablet (50 mg total) by mouth every 6 (six) hours as needed. 04/26/17   Gareth Morgan, MD    Family History Family History  Problem Relation Age of Onset  . Breast cancer Mother   . Diabetes Mother   . Hypertension Father   . Dementia Father   . Diabetes Sister   . COPD Brother   . Colon cancer Neg Hx     Social History Social History   Tobacco Use  . Smoking status: Never Smoker  . Smokeless tobacco: Former Systems developer    Types: Chew  Substance Use Topics  .  Alcohol use: No    Alcohol/week: 0.0 oz  . Drug use: No     Allergies   Patient has no known allergies.   Review of Systems Review of Systems  Constitutional: Negative for appetite change (one day was less) and fever.  HENT: Negative for sore throat.   Eyes: Negative for visual disturbance.  Respiratory: Negative for shortness of breath.   Cardiovascular: Negative for chest pain.  Gastrointestinal: Positive for abdominal pain (groin pain). Negative for constipation, diarrhea, nausea and vomiting.  Genitourinary: Negative for difficulty urinating, dysuria and testicular pain.  Musculoskeletal: Negative for back pain and neck stiffness.  Skin: Negative for rash.  Neurological: Negative for syncope and headaches.  Physical Exam Updated Vital Signs BP (!) 183/80 (BP Location: Right Arm)   Pulse 63   Temp 97.6 F (36.4 C) (Oral)   Resp 16   Ht 5' 10.5" (1.791 m)   Wt 75.8 kg (167 lb)   SpO2 96%   BMI 23.62 kg/m   Physical Exam  Constitutional: He is oriented to person, place, and time. He appears well-developed and well-nourished. No distress.  HENT:  Head: Normocephalic and atraumatic.  Eyes: Conjunctivae and EOM are normal.  Neck: Normal range of motion.  Cardiovascular: Normal rate, regular rhythm, normal heart sounds and intact distal pulses. Exam reveals no gallop and no friction rub.  No murmur heard. Pulmonary/Chest: Effort normal and breath sounds normal. No respiratory distress. He has no wheezes. He has no rales.  Abdominal: Soft. He exhibits no distension. There is no tenderness. There is no guarding. A hernia is present. Hernia confirmed positive in the right inguinal area (with cough present).  Genitourinary: Testes normal. Right testis shows no tenderness. Left testis shows no tenderness.  Musculoskeletal: He exhibits no edema.  Neurological: He is alert and oriented to person, place, and time.  Skin: Skin is warm and dry. He is not diaphoretic.    Nursing note and vitals reviewed.    ED Treatments / Results  Labs (all labs ordered are listed, but only abnormal results are displayed) Labs Reviewed  CBC WITH DIFFERENTIAL/PLATELET - Abnormal; Notable for the following components:      Result Value   RBC 4.20 (*)    All other components within normal limits  COMPREHENSIVE METABOLIC PANEL - Abnormal; Notable for the following components:   BUN 22 (*)    Creatinine, Ser 1.46 (*)    ALT 15 (*)    GFR calc non Af Amer 45 (*)    GFR calc Af Amer 52 (*)    All other components within normal limits  URINE CULTURE  LIPASE, BLOOD  URINALYSIS, ROUTINE W REFLEX MICROSCOPIC    EKG  EKG Interpretation None       Radiology Ct Abdomen Pelvis W Contrast  Result Date: 04/26/2017 CLINICAL DATA:  Hernia pain EXAM: CT ABDOMEN AND PELVIS WITH CONTRAST TECHNIQUE: Multidetector CT imaging of the abdomen and pelvis was performed using the standard protocol following bolus administration of intravenous contrast. CONTRAST:  80 mL ISOVUE-300 IOPAMIDOL (ISOVUE-300) INJECTION 61% COMPARISON:  October 16, 2011 FINDINGS: Lower chest: Changes of pulmonary fibrosis are identified bilateral lung bases. The heart size is mildly enlarged. Hepatobiliary: There is diffuse low density of the liver without vessel displacement. No focal liver lesion is identified. The gallbladder is normal. The biliary tree is normal. Pancreas: Unremarkable. No pancreatic ductal dilatation or surrounding inflammatory changes. Spleen: Normal in size without focal abnormality. Adrenals/Urinary Tract: The adrenal glands are normal. There are nonobstructing stones within the left kidney. There is no hydronephrosis bilaterally. Small cyst is identified in the right kidney. The bladder is normal. Stomach/Bowel: There is a moderate hiatal hernia. The stomach is otherwise normal. There is no small bowel obstruction or diverticulitis. The appendix is normal. Vascular/Lymphatic: Aortic  atherosclerosis. No enlarged abdominal or pelvic lymph nodes. Reproductive: Prostate calcification is noted. Other: There is right inguinal herniation of mesenteric fat. Musculoskeletal: Degenerative joint changes of the spine are noted. Chronic compression deformity of thoracic vertebral junction are noted. IMPRESSION: No acute abnormality identified in the abdomen and pelvis. Right inguinal herniation of mesenteric fat. No herniated bowel loop is noted. Electronically Signed   By: Seward Meth  Augustin Coupe M.D.   On: 04/26/2017 19:09    Procedures Procedures (including critical care time)  Medications Ordered in ED Medications  sodium chloride 0.9 % bolus 1,000 mL (0 mLs Intravenous Stopped 04/26/17 1724)  iopamidol (ISOVUE-300) 61 % injection (80 mLs Intravenous Contrast Given 04/26/17 1842)     Initial Impression / Assessment and Plan / ED Course  I have reviewed the triage vital signs and the nursing notes.  Pertinent labs & imaging results that were available during my care of the patient were reviewed by me and considered in my medical decision making (see chart for details).     76 year old male with history above presents with concern for right groin pain which has been increasing over the last week.  Labs done show no leukocytosis, no significant electrolyte abnormalities, no sign of urinary tract infection.   Patient without symptoms to suggest obstruction, no nausea, no vomiting.  Does not appear to have incarcerated hernia on exam.  CT abdomen and pelvis done shows right inguinal herniation of mesenteric fat.  Suspect symptoms related to hernia or muscular strain.  On my exam, he does not have protruding hernia except with straining.  Discussed patient with Dr. Excell Seltzer, will have him follow-up in the office as an outpatient.  Given number for [place] surgery.  Discussed obstructive symptoms, and reasons to return in detail.  Discussed that at this time he has no sign of obstruction,  incarcerated or strangulate hernia, and emergent surgery is not indicated.  Give him a prescription for tramadol after reviewing him in the [place] controlled substance database, which is no prior narcotic prescriptions.  Discussed risks of tramadol with patient in detail, recommended using Tylenol primarily for pain. Patient discharged in stable condition with understanding of reasons to return.   Final Clinical Impressions(s) / ED Diagnoses   Final diagnoses:  Right inguinal hernia    ED Discharge Orders        Ordered    traMADol (ULTRAM) 50 MG tablet  Every 6 hours PRN     04/26/17 2107       Gareth Morgan, MD 04/27/17 [phone]

## 2017-04-26 NOTE — ED Notes (Signed)
Patient transported to CT 

## 2017-04-27 LAB — URINE CULTURE: Culture: NO GROWTH

## 2017-04-30 ENCOUNTER — Ambulatory Visit: Payer: Self-pay | Admitting: Surgery

## 2017-04-30 NOTE — H&P (View-Only) (Signed)
Isaac [name]: 04/30/2017 9:24 AM Location: [facility] Patient #: [id number] DOB: Oct 28, 1940 Married / Language: Isaac [name] / Race: White Male  History of Present Illness [name] MD; 04/30/2017 10:54 AM) The patient is a 77 year old male who presents with an inguinal hernia. Note for "Inguinal hernia": ` ` ` Patient sent for surgical consultation at the request of Dr. Gareth Morgan  Chief Complaint: Right groin pain and swelling. Concern for inguinal hernia  The patient is an elderly gentleman. History of left inguinal hernia repaired in 1984. Recurrence repaired with mesh in 2013 by my partner, Dr. Nedra Hai. Patient noted some bulging in his right groin for a while. His primary care physician, Dr. Evette Doffing, recommended surgical evaluation. However he felt some sharp intense pain a few days ago. He was concerned. Went to the emergency room. Reducible inguinal hernia noted. CT scan saw no other abnormalities. Surgical consultation request reinforced . He does have a history of coronary disease. Father by Dr. Crissie Sickles with cardiology. Had a bypass graft done 14 years ago. Echocardiogram earlier in the year okay. On diuretics. Some coughing up productive past. Dressed in August. Not recurrence since. Mild constipation usually controlled. No history of skin infections. Does not smoke. No MRSA.  (Review of systems as stated in this history (HPI) or in the review of systems. Otherwise all other 12 point ROS are negative)   Past Surgical History (Isaac [name], [place]; 04/30/2017 9:24 AM) Cataract Surgery Bilateral. Colon Polyp Removal - Colonoscopy Coronary Artery Bypass Graft Open Inguinal Hernia Surgery Left. Shoulder Surgery Right.  Diagnostic Studies History (Isaac [name], [place]; 04/30/2017 9:24 AM) Colonoscopy 1-5 years ago  Allergies (Isaac [name], [place]; 04/30/2017 9:25 AM) No Known Drug Allergies [04/30/2017]: Allergies  Reconciled  Medication History (Isaac [name], [place]; 04/30/2017 9:30 AM) Aspirin (81MG  Tablet, Oral) Active. Vitamin D (2000UNIT Capsule, Oral) Active. Colace (100MG  Capsule, Oral) Active. Doxazosin Mesylate (4MG  Tablet, Oral) Active. Flonase (50MCG/ACT Suspension, Nasal) Active. Furosemide (40MG  Tablet, Oral) Active. Isosorbide Dinitrate (30MG  Tablet, Oral) Active. Lasix (40MG  Tablet, Oral) Active. [facility] Packet, Oral) Active. Metoprolol Succinate ER (50MG  Tablet ER 24HR, Oral) Active. Multi-Vitamin (Oral) Active. Myrbetriq (50MG  Tablet ER 24HR, Oral) Active. Omeprazole (20MG  Capsule DR, Oral) Active. Oxymeta-12 (0.05% Solution, Nasal) Active. Simethicone (125MG  Capsule, Oral) Active. Simvastatin (20MG  Tablet, Oral) Active. TraMADol HCl (50MG  Tablet, Oral) Active. Medications Reconciled  Social History (Isaac A. Brown, [place]; 04/30/2017 9:24 AM) No caffeine use No drug use Tobacco use Never smoker.  Family History (Isaac [name], Isaac [name]; 04/30/2017 9:24 AM) Alcohol Abuse Brother. Breast Cancer Mother. Diabetes Mellitus Mother. Hypertension Father.  Other Problems (Isaac [name], [place]; 04/30/2017 9:24 AM) Back Pain Congestive Heart Failure Gastroesophageal Reflux Disease Hemorrhoids High blood pressure Inguinal Hernia Myocardial infarction     Review of Systems (Isaac A. Brown RMA; 04/30/2017 9:24 AM) General Not Present- Appetite Loss, Chills, Fatigue, Fever, Night Sweats, Weight Gain and Weight Loss. Skin Not Present- Change in Wart/Mole, Dryness, Hives, Jaundice, New Lesions, Non-Healing Wounds, Rash and Ulcer. HEENT Not Present- Earache, Hearing Loss, Hoarseness, Nose Bleed, Oral Ulcers, Ringing in the Ears, Seasonal Allergies, Sinus Pain, Sore Throat, Visual Disturbances, Wears glasses/contact lenses and Yellow Eyes. Respiratory Not Present- Bloody sputum, Chronic Cough, Difficulty Breathing, Snoring and Wheezing. Breast Not  Present- Breast Mass, Breast Pain, Nipple Discharge and Skin Changes. Cardiovascular Not Present- Chest Pain, Difficulty Breathing Lying Down, Leg Cramps, Palpitations, Rapid Heart Rate, Shortness of Breath and Swelling of Extremities.  Gastrointestinal Not Present- Abdominal Pain, Bloating, Bloody Stool, Change in Bowel Habits, Chronic diarrhea, Constipation, Difficulty Swallowing, Excessive gas, Gets full quickly at meals, Hemorrhoids, Indigestion, Nausea, Rectal Pain and Vomiting. Male Genitourinary Not Present- Blood in Urine, Change in Urinary Stream, Frequency, Impotence, Nocturia, Painful Urination, Urgency and Urine Leakage.  Vitals (Isaac A. Brown RMA; 04/30/2017 9:25 AM) 04/30/2017 9:25 AM Weight: 168 lb Height: 70.5in Body Surface Area: 1.95 m Body Mass Index: 23.76 kg/m  Temp.: 98.38F  Pulse: 77 (Regular)  BP: 128/86 (Sitting, Left Arm, Standard)      Physical Exam [name] MD; 04/30/2017 10:54 AM)  General Mental Status-Alert. General Appearance-Not in acute distress, Not Sickly. Orientation-Oriented X3. Hydration-Well hydrated. Voice-Normal. Note: Moves slowly but steadily. Not toxic. Not sickly.  Integumentary Global Assessment Upon inspection and palpation of skin surfaces of the - Axillae: non-tender, no inflammation or ulceration, no drainage. and Distribution of scalp and body hair is normal. General Characteristics Temperature - normal warmth is noted.  Head and Neck Head-normocephalic, atraumatic with no lesions or palpable masses. Face Global Assessment - atraumatic, no absence of expression. Neck Global Assessment - no abnormal movements, no bruit auscultated on the right, no bruit auscultated on the left, no decreased range of motion, non-tender. Trachea-midline. Thyroid Gland Characteristics - non-tender.  Eye Eyeball - Left-Extraocular movements intact, No Nystagmus. Eyeball - Right-Extraocular movements intact,  No Nystagmus. Cornea - Left-No Hazy. Cornea - Right-No Hazy. Sclera/Conjunctiva - Left-No scleral icterus, No Discharge. Sclera/Conjunctiva - Right-No scleral icterus, No Discharge. Pupil - Left-Direct reaction to light normal. Pupil - Right-Direct reaction to light normal. Note: Wears glasses  ENMT Ears Pinna - Left - no drainage observed, no generalized tenderness observed. Right - no drainage observed, no generalized tenderness observed. Nose and Sinuses External Inspection of the Nose - no destructive lesion observed. Inspection of the nares - Left - quiet respiration. Right - quiet respiration. Mouth and Throat Lips - Upper Lip - no fissures observed, no pallor noted. Lower Lip - no fissures observed, no pallor noted. Nasopharynx - no discharge present. Oral Cavity/Oropharynx - Tongue - no dryness observed. Oral Mucosa - no cyanosis observed. Hypopharynx - no evidence of airway distress observed. Note: Tends to mumble  Chest and Lung Exam Inspection Movements - Normal and Symmetrical. Accessory muscles - No use of accessory muscles in breathing. Palpation Palpation of the chest reveals - Non-tender. Auscultation Breath sounds - Normal and Clear.  Cardiovascular Auscultation Rhythm - Regular. Murmurs & Other Heart Sounds - Auscultation of the heart reveals - No Murmurs and No Systolic Clicks.  Abdomen Inspection Inspection of the abdomen reveals - No Visible peristalsis and No Abnormal pulsations. Umbilicus - No Bleeding, No Urine drainage. Palpation/Percussion Palpation and Percussion of the abdomen reveal - Soft, Non Tender, No Rebound tenderness, No Rigidity (guarding) and No Cutaneous hyperesthesia. Note: Abdomen soft. Nontender. Not distended. No umbilical or incisional hernias. No guarding.  Male Genitourinary Sexual Maturity Tanner 5 - Adult hair pattern and Adult penile size and shape. Note: Right groin swelling consistent with reducible  inguinal hernia.  Left groin intact with no evidence of recurrent hernia. Normal external male genitalia. No inguinal lymphadenopathy.  Peripheral Vascular Upper Extremity Inspection - Left - No Cyanotic nailbeds, Not Ischemic. Right - No Cyanotic nailbeds, Not Ischemic.  Neurologic Neurologic evaluation reveals -normal attention span and ability to concentrate, able to name objects and repeat phrases. Appropriate fund of knowledge , normal sensation and normal coordination. Mental Status Affect - not angry, not  paranoid. Cranial Nerves-Normal Bilaterally. Gait-Normal.  Neuropsychiatric Mental status exam performed with findings of-able to articulate well with normal speech/language, rate, volume and coherence, thought content normal with ability to perform basic computations and apply abstract reasoning and no evidence of hallucinations, delusions, obsessions or homicidal/suicidal ideation.  Musculoskeletal Global Assessment Spine, Ribs and Pelvis - no instability, subluxation or laxity. Right Upper Extremity - no instability, subluxation or laxity.  Lymphatic Head & Neck  General Head & Neck Lymphatics: Bilateral - Description - No Localized lymphadenopathy. Axillary  General Axillary Region: Bilateral - Description - No Localized lymphadenopathy. Femoral & Inguinal  Generalized Femoral & Inguinal Lymphatics: Left - Description - No Localized lymphadenopathy. Right - Description - No Localized lymphadenopathy.    Assessment & Plan [name] MD; 04/30/2017 9:50 AM)  RIGHT INGUINAL HERNIA (K40.90) Impression: Small but symptomatic right inguinal hernia. No evidence of left inguinal hernia recurrence.  I think he would benefit from hernia repair. He does have coronary history. Evaluation in June 2018 relatively reassuring for his advanced age, but they wanted to see him in December. Overdue for cardiac reevaluation. I would like to double check to make sure that he  has clearance. No strong evidence of any pulmonary decline. Like to make sure there is no other surprises. Hopefully will be outpatient surgery. Patient is interested in proceeding.   PREOP - ING HERNIA - ENCOUNTER FOR PREOPERATIVE EXAMINATION FOR GENERAL SURGICAL PROCEDURE (Z01.818)  Current Plans You are being scheduled for surgery- Our schedulers will call you.  You should hear from our office's scheduling department within 5 working days about the location, date, and time of surgery. We try to make accommodations for patient's preferences in scheduling surgery, but sometimes the OR schedule or the surgeon's schedule prevents [place] from making those accommodations.  If you have not heard from our office [phone]) in 5 working days, call the office and ask for your surgeon's nurse.  If you have other questions about your diagnosis, plan, or surgery, call the office and ask for your surgeon's nurse.  Written instructions provided The anatomy & physiology of the abdominal wall and pelvic floor was discussed. The pathophysiology of hernias in the inguinal and pelvic region was discussed. Natural history risks such as progressive enlargement, pain, incarceration, and strangulation was discussed. Contributors to complications such as smoking, obesity, diabetes, prior surgery, etc were discussed.  I feel the risks of no intervention will lead to serious problems that outweigh the operative risks; therefore, I recommended surgery to reduce and repair the hernia. I explained laparoscopic techniques with possible need for an open approach. I noted usual use of mesh to patch and/or buttress hernia repair  Risks such as bleeding, infection, abscess, need for further treatment, heart attack, death, and other risks were discussed. I noted a good likelihood this will help address the problem. Goals of post-operative recovery were discussed as well. Possibility that this will not correct all  symptoms was explained. I stressed the importance of low-impact activity, aggressive pain control, avoiding constipation, & not pushing through pain to minimize risk of post-operative chronic pain or injury. Possibility of reherniation was discussed. We will work to minimize complications.  An educational handout further explaining the pathology & treatment options was given as well. Questions were answered. The patient expresses understanding & wishes to proceed with surgery.  Pt Education - Pamphlet Given - Laparoscopic Hernia Repair: discussed with patient and provided information. Pt Education - [facility] Pain Control (Tsering Leaman) Pt Education - [facility] Hernia  Post-Op HCI (Zyanya Glaza): discussed with patient and provided information. I recommended obtaining preoperative cardiac clearance. I am concerned about the health of the patient and the ability to tolerate the operation. Therefore, we will request clearance by cardiology to better assess operative risk & see if a reevaluation, further workup, etc is needed. Also recommendations on how medications such as for anticoagulation and blood pressure should be managed/held/restarted after surgery.

## 2017-04-30 NOTE — H&P (Signed)
Isaac [name] Documented: 04/30/2017 9:24 AM Location: [facility] Patient #: [id number] DOB: 1941/03/07 Married / Language: [name] / Race: White Male  History of Present Illness Isaac Hector MD; 04/30/2017 10:54 AM) The patient is a 77 year old male who presents with an inguinal hernia. Note for "Inguinal hernia": ` ` ` Patient sent for surgical consultation at the request of Dr. Gareth Morgan  Chief Complaint: Right groin pain and swelling. Concern for inguinal hernia  The patient is an elderly gentleman. History of left inguinal hernia repaired in 1984. Recurrence repaired with mesh in 2013 by my partner, Dr. Nedra Hai. Patient noted some bulging in his right groin for a while. His primary care physician, Dr. Evette Doffing, recommended surgical evaluation. However he felt some sharp intense pain a few days ago. He was concerned. Went to the emergency room. Reducible inguinal hernia noted. CT scan saw no other abnormalities. Surgical consultation request reinforced . He does have a history of coronary disease. Father by Dr. Crissie Sickles with cardiology. Had a bypass graft done 14 years ago. Echocardiogram earlier in the year okay. On diuretics. Some coughing up productive past. Dressed in August. Not recurrence since. Mild constipation usually controlled. No history of skin infections. Does not smoke. No MRSA.  (Review of systems as stated in this history (HPI) or in the review of systems. Otherwise all other 12 point ROS are negative)   Past Surgical History ([name], [place]; 04/30/2017 9:24 AM) Cataract Surgery Bilateral. Colon Polyp Removal - Colonoscopy Coronary Artery Bypass Graft Open Inguinal Hernia Surgery Left. Shoulder Surgery Right.  Diagnostic Studies History ([name], [place]; 04/30/2017 9:24 AM) Colonoscopy 1-5 years ago  Allergies ([name], [place]; 04/30/2017 9:25 AM) No Known Drug Allergies [04/30/2017]: Allergies  Reconciled  Medication History ([name], [place]; 04/30/2017 9:30 AM) Aspirin (81MG  Tablet, Oral) Active. Vitamin D (2000UNIT Capsule, Oral) Active. Colace (100MG  Capsule, Oral) Active. Doxazosin Mesylate (4MG  Tablet, Oral) Active. Flonase (50MCG/ACT Suspension, Nasal) Active. Furosemide (40MG  Tablet, Oral) Active. Isosorbide Dinitrate (30MG  Tablet, Oral) Active. Lasix (40MG  Tablet, Oral) Active. [employer] Packet, Oral) Active. Metoprolol Succinate ER (50MG  Tablet ER 24HR, Oral) Active. Multi-Vitamin (Oral) Active. Myrbetriq (50MG  Tablet ER 24HR, Oral) Active. Omeprazole (20MG  Capsule DR, Oral) Active. Oxymeta-12 (0.05% Solution, Nasal) Active. Simethicone (125MG  Capsule, Oral) Active. Simvastatin (20MG  Tablet, Oral) Active. TraMADol HCl (50MG  Tablet, Oral) Active. Medications Reconciled  Social History ([name], [place]; 04/30/2017 9:24 AM) No caffeine use No drug use Tobacco use Never smoker.  Family History ([name], [place]; 04/30/2017 9:24 AM) Alcohol Abuse Brother. Breast Cancer Mother. Diabetes Mellitus Mother. Hypertension Father.  Other Problems ([name], [place]; 04/30/2017 9:24 AM) Back Pain Congestive Heart Failure Gastroesophageal Reflux Disease Hemorrhoids High blood pressure Inguinal Hernia Myocardial infarction     Review of Systems ([name]; 04/30/2017 9:24 AM) General Not Present- Appetite Loss, Chills, Fatigue, Fever, Night Sweats, Weight Gain and Weight Loss. Skin Not Present- Change in Wart/Mole, Dryness, Hives, Jaundice, New Lesions, Non-Healing Wounds, Rash and Ulcer. HEENT Not Present- Earache, Hearing Loss, Hoarseness, Nose Bleed, Oral Ulcers, Ringing in the Ears, Seasonal Allergies, Sinus Pain, Sore Throat, Visual Disturbances, Wears glasses/contact lenses and Yellow Eyes. Respiratory Not Present- Bloody sputum, Chronic Cough, Difficulty Breathing, Snoring and Wheezing. Breast Not  Present- Breast Mass, Breast Pain, Nipple Discharge and Skin Changes. Cardiovascular Not Present- Chest Pain, Difficulty Breathing Lying Down, Leg Cramps, Palpitations, Rapid Heart Rate, Shortness of Breath and Swelling of Extremities.  Gastrointestinal Not Present- Abdominal Pain, Bloating, Bloody Stool, Change in Bowel Habits, Chronic diarrhea, Constipation, Difficulty Swallowing, Excessive gas, Gets full quickly at meals, Hemorrhoids, Indigestion, Nausea, Rectal Pain and Vomiting. Male Genitourinary Not Present- Blood in Urine, Change in Urinary Stream, Frequency, Impotence, Nocturia, Painful Urination, Urgency and Urine Leakage.  Vitals ([name]; 04/30/2017 9:25 AM) 04/30/2017 9:25 AM Weight: 168 lb Height: 70.5in Body Surface Area: 1.95 m Body Mass Index: 23.76 kg/m  Temp.: 98.73F  Pulse: 77 (Regular)  BP: 128/86 (Sitting, Left Arm, Standard)      Physical Exam Isaac Hector MD; 04/30/2017 10:54 AM)  General Mental Status-Alert. General Appearance-Not in acute distress, Not Sickly. Orientation-Oriented X3. Hydration-Well hydrated. Voice-Normal. Note: Moves slowly but steadily. Not toxic. Not sickly.  Integumentary Global Assessment Upon inspection and palpation of skin surfaces of the - Axillae: non-tender, no inflammation or ulceration, no drainage. and Distribution of scalp and body hair is normal. General Characteristics Temperature - normal warmth is noted.  Head and Neck Head-normocephalic, atraumatic with no lesions or palpable masses. Face Global Assessment - atraumatic, no absence of expression. Neck Global Assessment - no abnormal movements, no bruit auscultated on the right, no bruit auscultated on the left, no decreased range of motion, non-tender. Trachea-midline. Thyroid Gland Characteristics - non-tender.  Eye Eyeball - Left-Extraocular movements intact, No Nystagmus. Eyeball - Right-Extraocular movements intact,  No Nystagmus. Cornea - Left-No Hazy. Cornea - Right-No Hazy. Sclera/Conjunctiva - Left-No scleral icterus, No Discharge. Sclera/Conjunctiva - Right-No scleral icterus, No Discharge. Pupil - Left-Direct reaction to light normal. Pupil - Right-Direct reaction to light normal. Note: Wears glasses  ENMT Ears Pinna - Left - no drainage observed, no generalized tenderness observed. Right - no drainage observed, no generalized tenderness observed. Nose and Sinuses External Inspection of the Nose - no destructive lesion observed. Inspection of the nares - Left - quiet respiration. Right - quiet respiration. Mouth and Throat Lips - Upper Lip - no fissures observed, no pallor noted. Lower Lip - no fissures observed, no pallor noted. Nasopharynx - no discharge present. Oral Cavity/Oropharynx - Tongue - no dryness observed. Oral Mucosa - no cyanosis observed. Hypopharynx - no evidence of airway distress observed. Note: Tends to mumble  Chest and Lung Exam Inspection Movements - Normal and Symmetrical. Accessory muscles - No use of accessory muscles in breathing. Palpation Palpation of the chest reveals - Non-tender. Auscultation Breath sounds - Normal and Clear.  Cardiovascular Auscultation Rhythm - Regular. Murmurs & Other Heart Sounds - Auscultation of the heart reveals - No Murmurs and No Systolic Clicks.  Abdomen Inspection Inspection of the abdomen reveals - No Visible peristalsis and No Abnormal pulsations. Umbilicus - No Bleeding, No Urine drainage. Palpation/Percussion Palpation and Percussion of the abdomen reveal - Soft, Non Tender, No Rebound tenderness, No Rigidity (guarding) and No Cutaneous hyperesthesia. Note: Abdomen soft. Nontender. Not distended. No umbilical or incisional hernias. No guarding.  Male Genitourinary Sexual Maturity Tanner 5 - Adult hair pattern and Adult penile size and shape. Note: Right groin swelling consistent with reducible  inguinal hernia.  Left groin intact with no evidence of recurrent hernia. Normal external male genitalia. No inguinal lymphadenopathy.  Peripheral Vascular Upper Extremity Inspection - Left - No Cyanotic nailbeds, Not Ischemic. Right - No Cyanotic nailbeds, Not Ischemic.  Neurologic Neurologic evaluation reveals -normal attention span and ability to concentrate, able to name objects and repeat phrases. Appropriate fund of knowledge , normal sensation and normal coordination. Mental Status Affect - not angry, not  paranoid. Cranial Nerves-Normal Bilaterally. Gait-Normal.  Neuropsychiatric Mental status exam performed with findings of-able to articulate well with normal speech/language, rate, volume and coherence, thought content normal with ability to perform basic computations and apply abstract reasoning and no evidence of hallucinations, delusions, obsessions or homicidal/suicidal ideation.  Musculoskeletal Global Assessment Spine, Ribs and Pelvis - no instability, subluxation or laxity. Right Upper Extremity - no instability, subluxation or laxity.  Lymphatic Head & Neck  General Head & Neck Lymphatics: Bilateral - Description - No Localized lymphadenopathy. Axillary  General Axillary Region: Bilateral - Description - No Localized lymphadenopathy. Femoral & Inguinal  Generalized Femoral & Inguinal Lymphatics: Left - Description - No Localized lymphadenopathy. Right - Description - No Localized lymphadenopathy.    Assessment & Plan Isaac Hector MD; 04/30/2017 9:50 AM)  RIGHT INGUINAL HERNIA (K40.90) Impression: Small but symptomatic right inguinal hernia. No evidence of left inguinal hernia recurrence.  I think he would benefit from hernia repair. He does have coronary history. Evaluation in June 2018 relatively reassuring for his advanced age, but they wanted to see him in December. Overdue for cardiac reevaluation. I would like to double check to make sure that he  has clearance. No strong evidence of any pulmonary decline. Like to make sure there is no other surprises. Hopefully will be outpatient surgery. Patient is interested in proceeding.   PREOP - ING HERNIA - ENCOUNTER FOR PREOPERATIVE EXAMINATION FOR GENERAL SURGICAL PROCEDURE (Z01.818)  Current Plans You are being scheduled for surgery- Our schedulers will call you.  You should hear from our office's scheduling department within 5 working days about the location, date, and time of surgery. We try to make accommodations for patient's preferences in scheduling surgery, but sometimes the OR schedule or the surgeon's schedule prevents [place] from making those accommodations.  If you have not heard from our office [phone]) in 5 working days, call the office and ask for your surgeon's nurse.  If you have other questions about your diagnosis, plan, or surgery, call the office and ask for your surgeon's nurse.  Written instructions provided The anatomy & physiology of the abdominal wall and pelvic floor was discussed. The pathophysiology of hernias in the inguinal and pelvic region was discussed. Natural history risks such as progressive enlargement, pain, incarceration, and strangulation was discussed. Contributors to complications such as smoking, obesity, diabetes, prior surgery, etc were discussed.  I feel the risks of no intervention will lead to serious problems that outweigh the operative risks; therefore, I recommended surgery to reduce and repair the hernia. I explained laparoscopic techniques with possible need for an open approach. I noted usual use of mesh to patch and/or buttress hernia repair  Risks such as bleeding, infection, abscess, need for further treatment, heart attack, death, and other risks were discussed. I noted a good likelihood this will help address the problem. Goals of post-operative recovery were discussed as well. Possibility that this will not correct all  symptoms was explained. I stressed the importance of low-impact activity, aggressive pain control, avoiding constipation, & not pushing through pain to minimize risk of post-operative chronic pain or injury. Possibility of reherniation was discussed. We will work to minimize complications.  An educational handout further explaining the pathology & treatment options was given as well. Questions were answered. The patient expresses understanding & wishes to proceed with surgery.  Pt Education - Pamphlet Given - Laparoscopic Hernia Repair: discussed with patient and provided information. Pt Education - CCS Pain Control (Ancelmo Hunt) Pt Education - [facility]CS Hernia  Post-Op HCI (Jasleen Riepe): discussed with patient and provided information. I recommended obtaining preoperative cardiac clearance. I am concerned about the health of the patient and the ability to tolerate the operation. Therefore, we will request clearance by cardiology to better assess operative risk & see if a reevaluation, further workup, etc is needed. Also recommendations on how medications such as for anticoagulation and blood pressure should be managed/held/restarted after surgery.

## 2017-05-10 NOTE — Progress Notes (Signed)
Cardiology Office Note Date:  05/12/2017  Patient ID:  Isaac [name], Isaac [name] 06/10/1940, MRN [id number] PCP:  Dettinger, Fransisca Kaufmann, MD  Cardiologist:  Dr. Lovena Le    Chief Complaint: pre-op clearance, inguinal hernia repair  History of Present Illness: Isaac [name] is a 77 y.o. male with history of CAD (CABG 2105), known 2 occl grafts, GERD, HTN, HLD, CBP, CRI (III).    He comes today to be seen for dr. Lovena Le, last seen by him in May 2018, at that time note mentions some degree of underlying lung disease, with patient c/o worsening DOE when walking inclines.   Dr. Lovena Le stated " He will undergo ongoing pulmonary eval. I am concerned that he has either developed worsening LV dysfunction or is developing ischemic induced diastolic dysfunction. I have recommended he undergo exerecise testing with a stress echo did look at the physiologic burden of his ischemia. I would envision referral for cath if he has an abnormal stress echo. I would anticipate increasing his diuretic, particularly if the stress echo is negative" PFTs were pending.  Stress echo was OK, low risk with no evidence of stress induced ischemia, LVEF 55% at rest with akinetic basalinferior wall, stress LVEF 75%, same WMA  He saw Dr. Melvyn Novas in August, no clear or ongoing pulmonary issues or recommendations  He has custody of his 58 month and 4 year grandchildren and is able to keep up with them.  He reports able to chop his wood and haul/stack it, without changes in his exertional capacity, he will occasionally have to take breaks though not often.  He only reports trouble breathing 2/2 is nasal congestion and his nasal spray typically keeps this pretty well controlled.   Mentioned that he had a pneumonia in Nov 2017 and felt like his lungs were never quite the same.  No CP of any kind, no dizziness, near syncope or syncope.  No rest SOB, no symptoms of PND or orthopnea. He feels like his exertional capacity in the last year has been  stable, if anything better of late.  He has ongoing groin pain with his surgery, and looking forward to his surgery and getting the hernia taken care of.   RCRI: 0.9  With hx of Diastolic HF hx and CAD is 6.6 DASI: 37.45, 7.34METS    Past Medical History:  Diagnosis Date  . Allergic rhinitis   . Allergy   . CAD (coronary artery disease) of artery bypass graft, Occluded VG-non dominant LCX medical therapy 11/25/2013  . Cataract   . GERD (gastroesophageal reflux disease)   . H/O hiatal hernia   . History of skin cancer    bilateral arms with removal  . Hyperlipidemia   . Hypertension   . Ischemic heart disease   . Lumbar disc disease   . Myocardial infarction [facility]) 2002    Past Surgical History:  Procedure Laterality Date  . CATARACT EXTRACTION Bilateral 1995,2000  . COLONOSCOPY    . CORONARY ARTERY BYPASS GRAFT  2002   LIMA-LAD, SVG-Diag, SVG-RCA  . GROIN DISSECTION  01/23/2012   Procedure: Virl Son EXPLORATION;  Surgeon: Harl Bowie, MD;  Location: [facility];  Service: General;  Laterality: Left;  Left Inguinal Exploration, release of scar tissue, Placement of Mesh  . INGUINAL HERNIA REPAIR Left 1983  . LEFT HEART CATHETERIZATION WITH CORONARY ANGIOGRAM N/A 11/24/2013   Procedure: LEFT HEART CATHETERIZATION WITH CORONARY ANGIOGRAM;  Surgeon: Peter M Martinique, MD;  Location[facility] C[facility] LAB;  Service: Cardiovascular;  Laterality: N/A;  . NOSE SURGERY  1985    Current Outpatient Medications  Medication Sig Dispense Refill  . aspirin (ADULT ASPIRIN EC LOW STRENGTH) 81 MG EC tablet Take 81 mg by mouth every morning.     . Cholecalciferol (VITAMIN D) 2000 UNITS tablet Take 2,000 Units by mouth daily.     [name] docusate sodium (COLACE) 100 MG capsule Take 1 capsule by mouth every other day    . doxazosin (CARDURA) 4 MG tablet TAKE 1 TABLET BY MOUTH IN THE MORNING AND 1 TABLET AT BEDTIME    . fluticasone (FLONASE) 50 MCG/ACT nasal spray Place 2 sprays into both nostrils daily as needed  for allergies or rhinitis.     . furosemide (LASIX) 40 MG tablet Take 40 mg by mouth daily as needed for fluid or edema.    . isosorbide mononitrate (IMDUR) 30 MG 24 hr tablet Take 30 mg by mouth daily.    [name] KLOR-CON M20 20 MEQ tablet TAKE ONE TABLET BY MOUTH ONCE DAILY WHEN  YOU  TAKE LASIX 30 tablet 3  . metoprolol succinate (TOPROL-XL) 50 MG 24 hr tablet TAKE ONE TABLET BY MOUTH IN THE MORNING AND ONE-HALF IN THE EVENING 45 tablet 5  . Multiple Vitamin (MULTIVITAMIN WITH MINERALS) TABS Take 1 tablet by mouth daily.     . multivitamin-lutein (OCUVITE-LUTEIN) CAPS capsule Take 1 capsule by mouth daily.    [name] MYRBETRIQ 50 MG TB24 tablet Take 1 tablet by mouth daily.    . nitroGLYCERIN (NITROSTAT) 0.4 MG SL tablet Place 1 tablet (0.4 mg total) under the tongue every 5 (five) minutes as needed for chest pain (MAX 3 TABLETS). 10 tablet 2  . omeprazole (PRILOSEC) 20 MG capsule Take 20 mg by mouth 2 (two) times daily.    [name] oxymetazoline (AFRIN) 0.05 % nasal spray Place 2 sprays into the nose every 12 (twelve) hours as needed for congestion (x3 days). Dryness    . Simethicone (GAS-X EXTRA STRENGTH) 125 MG CAPS Take by mouth as directed. Reported on 10/10/2015    . simvastatin (ZOCOR) 20 MG tablet Take 1 tablet (20 mg total) by mouth at bedtime. 90 tablet 0  . sulindac (CLINORIL) 200 MG tablet Take up to twice daily with meals as needed for joint pain 60 tablet 11  . traMADol (ULTRAM) 50 MG tablet Take 1 tablet (50 mg total) by mouth every 6 (six) hours as needed. 15 tablet 0   No current facility-administered medications for this visit.     Allergies:   Patient has no known allergies.   Social History:  The patient  reports that  has never smoked. He quit smokeless tobacco use about 3 years ago. His smokeless tobacco use included chew. He reports that he does not drink alcohol or use drugs.   Family History:  The patient's family history includes Breast cancer in his mother; COPD in his brother;  Dementia in his father; Diabetes in his mother and sister; Hypertension in his father.  ROS:  Please see the history of present illness.   All other systems are reviewed and otherwise negative.   PHYSICAL EXAM:  VS:  BP (!) 144/70   Pulse 65   Ht 5' 10.5" (1.791 m)   Wt 167 lb (75.8 kg)   BMI 23.62 kg/m  BMI: Body mass index is 23.62 kg/m. Well nourished, well developed, in no acute distress  HEENT: normocephalic, atraumatic  Neck: no JVD, carotid bruits or masses Cardiac:  RRR; no significant  murmurs, no rubs, or gallops Lungs:  CTA b/l, no wheezing, rhonchi or rales  Abd: soft, nontender MS: no deformity or atrophy Ext:  no edema  Skin: warm and dry, no rash Neuro:  No gross deficits appreciated Psych: euthymic mood, full affect   EKG:  Done today and reviewed by myself SR 65bpm, no significant changes from prior  10/07/16: stress echo Study Conclusions - Stress ECG conclusions: There were no stress arrhythmias or   conduction abnormalities. The stress ECG was normal. - Staged echo: There was no echocardiographic evidence for   stress-induced ischemia. Low risk. - Baseline: LV global systolic function was normal. The estimated   LV ejection fraction was 55%. Akinesis of the basalinferior LV   myocardium. - Peak stress: LV global systolic function was hyperdynamic. The   estimated LV ejection fraction was 75%. Akinesis of the basal   inferior LV myocardium.  02/21/14: TTE Study Conclusions - Left ventricle: The cavity size was normal. There was moderate concentric hypertrophy. Systolic function was normal. The estimated ejection fraction was in the range of 55% to 60%. Wall motion was normal; there were no regional wall motion abnormalities. Doppler parameters are consistent with abnormal left ventricular relaxation (grade 1 diastolic dysfunction). The E/e&' ratio is between 8-15, suggesting indeterminate LV filling pressure. - Aortic valve: Sclerosis  without stenosis. There was mild regurgitation. Regurgitation pressure half-time: 510 ms. - Mitral valve: Mildly thickened leaflets . There was mild regurgitation. - Left atrium: Moderately dilated at 45 ml/m2. - Tricuspid valve: There was mild regurgitation. - Pulmonary arteries: PA peak pressure: 35 mm Hg (S). - Inferior vena cava: The vessel was normal in size. The respirophasic diameter changes were in the normal range (>= 50%), consistent with normal central venous pressure. Impressions: - Compared to the prior echo in 10/2013, the EF is stable without clear wall motion abnormality. There is now only mild AI and MR. The LA is moderately dilated with mild TR and the RVSP is somewhat reduced.  Recent Labs: 09/11/2016: Pro B Natriuretic peptide (BNP) 803.0; TSH 2.36 04/26/2017: ALT 15; BUN 22; Creatinine, Ser 1.46; Hemoglobin 13.4; Platelets 164; Potassium 4.6; Sodium 137  No results found for requested labs within last 8760 hours.   Estimated Creatinine Clearance: 45.2 mL/min (A) (by C-G formula based on SCr of 1.46 mg/dL (H)).   Wt Readings from Last 3 Encounters:  05/12/17 167 lb (75.8 kg)  04/26/17 167 lb (75.8 kg)  04/15/17 167 lb 6.4 oz (75.9 kg)     Other studies reviewed: Additional studies/records reviewed today include: summarized above  ASSESSMENT AND PLAN:  1. CAD     No anginal complaints     On ASA, BB, statin tx     Stress echo in may 2018 was ok  2. HTN     No changes, looks OK  3. HLD     Monitored and managed with his PMD  4. Hx of diastolic dysfunction     No exam findings to suggest fluid OL     Weight is stable, down a couple pounds actually  5. Pre-op evaluation      Cardic risk score is elevated, his DUKE activity index is very good      No need for further cardiac testing pre-op      Reviewed with Dr. Lovena Le      OK from our perspective to proceed with hernia surgery   Disposition: F/u with annual visit in  May, sooner if  needed.  Current medicines are reviewed at length with the patient today.  The patient did not have any concerns regarding medicines.  Venetia Night, PA-C 05/12/2017 9:46 AM     [facility] [address] [place] [address] [phone] (office)  [phone] (fax)

## 2017-05-12 ENCOUNTER — Ambulatory Visit: Payer: Medicare Other | Admitting: Physician Assistant

## 2017-05-12 VITALS — BP 144/70 | HR 65 | Ht 70.5 in | Wt 167.0 lb

## 2017-05-12 DIAGNOSIS — I2510 Atherosclerotic heart disease of native coronary artery without angina pectoris: Secondary | ICD-10-CM | POA: Diagnosis not present

## 2017-05-12 DIAGNOSIS — I10 Essential (primary) hypertension: Secondary | ICD-10-CM

## 2017-05-12 DIAGNOSIS — E7849 Other hyperlipidemia: Secondary | ICD-10-CM | POA: Diagnosis not present

## 2017-05-12 DIAGNOSIS — Z01818 Encounter for other preprocedural examination: Secondary | ICD-10-CM

## 2017-05-12 NOTE — Patient Instructions (Addendum)
Medication Instructions:   Your physician recommends that you continue on your current medications as directed. Please refer to the Current Medication list given to you today.   If you need a refill on your cardiac medications before your next appointment, please call your pharmacy.  Labwork: NONE ORDERED  TODAY    Testing/Procedures: NONE ORDERED  TODAY    Follow-Up: IN MAY WITH URSUY OR TAYLOR    Any Other Special Instructions Will Be Listed Below (If Applicable).

## 2017-05-13 ENCOUNTER — Other Ambulatory Visit: Payer: Self-pay | Admitting: Family Medicine

## 2017-05-13 MED ORDER — OMEPRAZOLE 20 MG PO CPDR: 20.0000 mg | DELAYED_RELEASE_CAPSULE | Freq: Two times a day (BID) | ORAL | 5 refills | Status: DC

## 2017-05-13 NOTE — Telephone Encounter (Signed)
Med sent to pharmacy per patients request

## 2017-05-14 ENCOUNTER — Encounter (HOSPITAL_BASED_OUTPATIENT_CLINIC_OR_DEPARTMENT_OTHER): Payer: Self-pay | Admitting: *Deleted

## 2017-05-14 ENCOUNTER — Other Ambulatory Visit: Payer: Self-pay

## 2017-05-14 NOTE — Progress Notes (Signed)
NPO AFTER MIDNIGHT FOOD, TAKE IMDUR DOXAZOSIN METOPROLOL SUCCINATE, OMEPRAZOLE, MYRBETRIQ IN A, ARRIVE 700 AM 05-28-17 [facility] CARDIAC CLEARANCE RENEE URSAY 05-12-17 ON CHART AND Epic, STRESS ECHO 10-07-16 Epic/ CHART, CHEST XRAY 09-11-16 Epic, EKG 05-12-17-Epic/CHART LOV DR AXKP PULMONARY 12-12-16 Epic/CHART, EDCUATED PATIENT HIBICLENS SHOWERS HS AND AM BEFORE SURGERY EDUCATED PATIENT  NO SOLID FOOD AFTER MIDNIGHT THE NIGHT PRIOR TO SRRGERY. NOTHING BY MOUTH EXCEPT CLEAR LIQUIDS UNTIL 3 HOURS PRIOR TO Seltzer SURGERY. PLEASE FINISH ENSURE DRINK PER SURGEON ORDER 3 HOURS PRIOR TO SCHEDULED SURGERY TIME WHICH NEEDS TO BE COMPLETED AT 600.

## 2017-05-19 ENCOUNTER — Ambulatory Visit (INDEPENDENT_AMBULATORY_CARE_PROVIDER_SITE_OTHER): Payer: Medicare Other | Admitting: *Deleted

## 2017-05-19 ENCOUNTER — Encounter: Payer: Self-pay | Admitting: *Deleted

## 2017-05-19 VITALS — BP 129/62 | HR 68 | Ht 67.5 in | Wt 168.0 lb

## 2017-05-19 DIAGNOSIS — Z0000 Encounter for general adult medical examination without abnormal findings: Secondary | ICD-10-CM | POA: Diagnosis not present

## 2017-05-19 NOTE — Patient Instructions (Signed)
  [name] , Thank you for taking time to come for your Medicare Wellness Visit. I appreciate your ongoing commitment to your health goals. Please review the following plan we discussed and let me know if I can assist you in the future.   These are the goals we discussed: Goals    . Exercise 150 min/wk Moderate Activity     Increase activity slowly with a goal of 150 minutes a week       Increase activity as tolerated after hernia surgery.    This is a list of the screening recommended for you and due dates:  Health Maintenance  Topic Date Due  . Tetanus Vaccine  05/30/2024  . Flu Shot  Completed  . Pneumonia vaccines  Completed

## 2017-05-26 NOTE — Progress Notes (Signed)
Subjective:   Isaac [name] is a 77 y.o. male who presents for an Initial Medicare Annual Wellness Visit. Remarried in Aug 14, 2003 after first wife died of emphysema in 2001/08/13. 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 He lives in a one story home with his wife. They raising 2 great grandchildren. One is 62 months old and the other is 77 years old. He has no biological children.   Review of Systems  Health is about the same as last year.   Cardiac Risk Factors include: advanced age (>77men, >37 women);sedentary lifestyle;male gender;hypertension(Previous MI 08-13-00)  R inguinal hernia  Other systems negative   Objective:    Today's Vitals   05/19/17 0854 05/19/17 0902  BP: 129/62   Pulse: 68   Weight: 168 lb (76.2 kg)   Height: 5' 7.5" (1.715 m)   PainSc:  5    Body mass index is 25.92 kg/m.  Advanced Directives 05/19/2017 04/26/2017 10/10/2015 06/05/2015 11/10/2014 10/27/2014 02/20/2014  Does Patient Have a Medical Advance Directive? Yes Yes Yes Yes Yes Yes Yes  Type of Paramedic of [place];Living will Living will [place];Living will [place];Living will - Living will;Healthcare Power of [place];Living will  Does patient want to make changes to medical advance directive? No - Patient declined - - - - - No - Patient declined  Copy of [place] in Chart? Yes - - - No - copy requested - Yes  Would patient like information on creating a medical advance directive? - - - - - - -    Current Medications (verified) Outpatient Encounter Medications as of 05/19/2017  Medication Sig  . aspirin (ADULT ASPIRIN EC LOW STRENGTH) 81 MG EC tablet Take 81 mg by mouth every morning.   . Cholecalciferol (VITAMIN D) 2000 UNITS tablet Take 2,000 Units by mouth daily.   [name] docusate sodium (COLACE) 100 MG capsule Take 1  capsule by mouth every other day  . doxazosin (CARDURA) 4 MG tablet TAKE 1 TABLET BY MOUTH IN THE MORNING AND 1 TABLET AT BEDTIME  . fluticasone (FLONASE) 50 MCG/ACT nasal spray Place 2 sprays into both nostrils daily as needed for allergies or rhinitis.   . furosemide (LASIX) 40 MG tablet Take 40 mg by mouth daily as needed for fluid or edema.  . isosorbide mononitrate (IMDUR) 30 MG 24 hr tablet Take 30 mg by mouth daily.  [name] KLOR-CON M20 20 MEQ tablet TAKE ONE TABLET BY MOUTH ONCE DAILY WHEN  YOU  TAKE LASIX  . metoprolol succinate (TOPROL-XL) 50 MG 24 hr tablet TAKE ONE TABLET BY MOUTH IN THE MORNING AND ONE-HALF IN THE EVENING  . Multiple Vitamin (MULTIVITAMIN WITH MINERALS) TABS Take 1 tablet by mouth daily.   . multivitamin-lutein (OCUVITE-LUTEIN) CAPS capsule Take 1 capsule by mouth daily.  [name] MYRBETRIQ 50 MG TB24 tablet Take 1 tablet by mouth daily.  . nitroGLYCERIN (NITROSTAT) 0.4 MG SL tablet Place 1 tablet (0.4 mg total) under the tongue every 5 (five) minutes as needed for chest pain (MAX 3 TABLETS).  [name] omeprazole (PRILOSEC) 20 MG capsule Take 1 capsule (20 mg total) by mouth 2 (two) times daily.  [name] oxymetazoline (AFRIN) 0.05 % nasal spray Place 2 sprays into the nose every 12 (twelve) hours as needed for congestion (x3 days). Dryness  . Simethicone (GAS-X EXTRA STRENGTH) 125 MG CAPS Take by  mouth as directed. Reported on 10/10/2015 one after each meal  . simvastatin (ZOCOR) 20 MG tablet Take 1 tablet (20 mg total) by mouth at bedtime.  . sulindac (CLINORIL) 200 MG tablet Take up to twice daily with meals as needed for joint pain  . traMADol (ULTRAM) 50 MG tablet Take 1 tablet (50 mg total) by mouth every 6 (six) hours as needed.   No facility-administered encounter medications on file as of 05/19/2017.     Allergies (verified) Patient has no known allergies.   History: Past Medical History:  Diagnosis Date  . Allergic rhinitis   . Allergy   . CAD (coronary artery disease) of  artery bypass graft, Occluded VG-non dominant LCX medical therapy 11/25/2013  . Cataract   . Dyspnea    with exertion   . GERD (gastroesophageal reflux disease)   . H/O hiatal hernia   . History of skin cancer    bilateral arms with removal  . Hyperlipidemia   . Hypertension   . Ischemic heart disease   . Lumbar disc disease   . Myocardial infarction [facility]) 08/02/2000   Past Surgical History:  Procedure Laterality Date  . COLONOSCOPY    . CORONARY ARTERY BYPASS GRAFT  08-02-00   LIMA-LAD, SVG-Diag, SVG-RCA  . EYE SURGERY Bilateral 1995, 2000   ioc for cataracts  . GROIN DISSECTION  01/23/2012   Procedure: Virl Son EXPLORATION;  Surgeon: Harl Bowie, MD;  Location: [facility] ORS;  Service: General;  Laterality: Left;  Left Inguinal Exploration, release of scar tissue, Placement of Mesh  . INGUINAL HERNIA REPAIR Left 1983  . LEFT HEART CATHETERIZATION WITH CORONARY ANGIOGRAM N/A 11/24/2013   Procedure: LEFT HEART CATHETERIZATION WITH CORONARY ANGIOGRAM;  Surgeon: Peter M Martinique, MD;  Location: [facility] CATH LAB;  Service: Cardiovascular;  Laterality: N/A;  . NOSE SURGERY  1985   Family History  Problem Relation Age of Onset  . Breast cancer Mother   . Diabetes Mother   . Hypertension Father   . Dementia Father 50       early 70s  . Diabetes Sister   . COPD Brother   . Colon cancer Neg Hx    Social History   Socioeconomic History  . Marital status: Married    Spouse name: [name]  . Number of children: 0  . Years of education: Not on file  . Highest education level: 11th grade  Social Needs  . Financial resource strain: Not very hard  . Food insecurity - worry: Never true  . Food insecurity - inability: Never true  . Transportation needs - medical: No  . Transportation needs - non-medical: No  Occupational History  . Occupation: Retired    Fish farm manager: [employer]  Tobacco Use  . Smoking status: Never Smoker  . Smokeless tobacco: Former Systems developer    Types: Chew  Substance and  Sexual Activity  . Alcohol use: No    Alcohol/week: 0.0 oz  . Drug use: No  . Sexual activity: No  Other Topics Concern  . Not on file  Social History Narrative   Remarried in 08/03/03 after first wife died of emphysema in 02-Aug-2001. 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 He lives in a one story home with his wife. They raising 2 great grandchildren. One is 60 months old and the other is 77 years old. He has no biological children.    Tobacco Counseling No tobacco use  Clinical Intake: Pain : 0-10 Pain Score: 5  Pain Type: Acute pain Pain Location: Abdomen Pain Orientation: Right, Lower Pain Descriptors / Indicators: Aching, Stabbing, Tender Pain Onset: More than a month ago Pain Frequency: Constant Pain Relieving Factors: Tylenol and Tramadol helps a little Effect of Pain on Daily Activities: mild  Pain Relieving Factors: Tylenol and Tramadol helps a little  Nutritional Status: BMI of 19-24  Normal Diabetes: No  How often do you need to have someone help you when you read instructions, pamphlets, or other written materials from your doctor or pharmacy?: 1 - Never What is the last grade level you completed in school?: 11th with some technical school-blueprint reading  Interpreter Needed?: No  Information entered by :: Chong Sicilian, RN  Activities of Daily Living In your present state of health, do you have any difficulty performing the following activities: 05/19/2017  Hearing? Y  Comment Hard to understand people when they talk low. Has not been evaluated and isn't interested in that right now.   Vision? N  Comment Anthony Sar, OD. Eye exam is overdue.   Difficulty concentrating or making decisions? Y  Comment has noticed some decrease in short term memory. He laid down an envelope with $3400 and can't find it.   Walking or climbing stairs? N  Comment One step from carport into home. No  difficulty with steps.   Dressing or bathing? N  Doing errands, shopping? N  Preparing Food and eating ? N  Using the Toilet? N  In the past six months, have you accidently leaked urine? N  Do you have problems with loss of bowel control? N  Managing your Medications? N  Comment Leaves medicines in their orginial bottles but keeps them organized in a box  Managing your Finances? N  Housekeeping or managing your Housekeeping? N  Some recent data might be hidden     Immunizations and Health Maintenance Immunization History  Administered Date(s) Administered  . H1N1 05/02/2008  . Influenza Split 02/10/2014  . Influenza Whole 01/17/2008, 04/09/2009, 01/03/2011, 02/27/2012  . Influenza, High Dose Seasonal PF 01/21/2017  . Influenza,inj,Quad PF,6+ Mos 03/07/2013, 06/05/2015, 02/11/2016  . Pneumococcal Conjugate-13 03/28/2014  . Pneumococcal Polysaccharide-23 01/02/2017  . Tdap 06/30/2011, 05/30/2014   There are no preventive care reminders to display for this patient.  Patient Care Team: Dettinger, Fransisca Kaufmann, MD as PCP - General (Family Medicine) Michael Boston, MD as Consulting Physician (General Surgery) Evans Lance, MD as Consulting Physician (Cardiology)  ED visit 04/26/17 for right inguinal hernia. No hospitalizations or surgeries     Assessment:   This is a routine wellness examination for Aerik.  Hearing/Vision screen No deficits noted during visit  Dietary issues and exercise activities discussed: Current Exercise Habits: The patient does not participate in regular exercise at present, Exercise limited by: respiratory conditions(s);Other - see comments(Inguinal hernia)  Goals    . Exercise 150 min/wk Moderate Activity     Increase activity slowly with a goal of 150 minutes a week       Depression Screen PHQ 2/9 Scores 05/19/2017 04/15/2017 01/02/2017  PHQ - 2 Score 0 0 0    Fall Risk Fall Risk  05/19/2017 04/15/2017 01/02/2017  Falls in the past year? Yes Yes  Yes  Comment Tripped over a bean bag that was moved to the hallway. Was holding great grandaughter and didn't want to hurt her - -  Number falls in past yr: 1 2 or more 2 or more  Injury with  Fall? Yes No No  Risk Factor Category  - High Fall Risk -  Risk for fall due to : History of fall(s);Impaired mobility History of fall(s);Impaired balance/gait -  Follow up Falls prevention discussed - -   Cognitive Function: MMSE - Mini Mental State Exam 05/19/2017  Orientation to time 5  Orientation to Place 5  Registration 3  Attention/ Calculation 5  Recall 1  Language- name 2 objects 2  Language- repeat 1  Language- follow 3 step command 3  Language- read & follow direction 1  Write a sentence 1  Copy design 1  Total score 28       Screening Tests Health Maintenance  Topic Date Due  . TETANUS/TDAP  05/30/2024  . INFLUENZA VACCINE  Completed  . PNA vac Low Risk Adult  Completed       Plan:  Increase activity slowly with a goal of 150 min of moderate activity a week.  Schedule an eye exam.  Keep f/u with PCP  I have personally reviewed and noted the following in the patient's chart:   . Medical and social history . Use of alcohol, tobacco or illicit drugs  . Current medications and supplements . Functional ability and status . Nutritional status . Physical activity . Advanced directives . List of other physicians . Hospitalizations, surgeries, and ER visits in previous 12 months . Vitals . Screenings to include cognitive, depression, and falls . Referrals and appointments  In addition, I have reviewed and discussed with patient certain preventive protocols, quality metrics, and best practice recommendations. A written personalized care plan for preventive services as well as general preventive health recommendations were provided to patient.     Chong Sicilian, RN   05/19/2017    I have reviewed and agree with the above AWV documentation.   Evelina Dun,  FNP

## 2017-05-27 ENCOUNTER — Encounter: Payer: Self-pay | Admitting: *Deleted

## 2017-05-28 ENCOUNTER — Ambulatory Visit (HOSPITAL_BASED_OUTPATIENT_CLINIC_OR_DEPARTMENT_OTHER)
Admission: RE | Admit: 2017-05-28 | Discharge: 2017-05-28 | Disposition: A | Discharge status: Home / Self Care | Payer: Medicare Other | Source: Ambulatory Visit | Attending: Surgery | Admitting: Surgery

## 2017-05-28 ENCOUNTER — Encounter (HOSPITAL_BASED_OUTPATIENT_CLINIC_OR_DEPARTMENT_OTHER)
Admission: RE | Disposition: A | Discharge status: Home / Self Care | Payer: Self-pay | Source: Ambulatory Visit | Attending: Surgery

## 2017-05-28 ENCOUNTER — Ambulatory Visit (HOSPITAL_BASED_OUTPATIENT_CLINIC_OR_DEPARTMENT_OTHER): Payer: Medicare Other | Admitting: Anesthesiology

## 2017-05-28 ENCOUNTER — Encounter (HOSPITAL_BASED_OUTPATIENT_CLINIC_OR_DEPARTMENT_OTHER): Payer: Self-pay

## 2017-05-28 DIAGNOSIS — I252 Old myocardial infarction: Secondary | ICD-10-CM | POA: Diagnosis not present

## 2017-05-28 DIAGNOSIS — I509 Heart failure, unspecified: Secondary | ICD-10-CM | POA: Insufficient documentation

## 2017-05-28 DIAGNOSIS — I2510 Atherosclerotic heart disease of native coronary artery without angina pectoris: Secondary | ICD-10-CM | POA: Diagnosis not present

## 2017-05-28 DIAGNOSIS — K219 Gastro-esophageal reflux disease without esophagitis: Secondary | ICD-10-CM | POA: Diagnosis not present

## 2017-05-28 DIAGNOSIS — Z951 Presence of aortocoronary bypass graft: Secondary | ICD-10-CM | POA: Insufficient documentation

## 2017-05-28 DIAGNOSIS — K4190 Unilateral femoral hernia, without obstruction or gangrene, not specified as recurrent: Secondary | ICD-10-CM

## 2017-05-28 DIAGNOSIS — K4090 Unilateral inguinal hernia, without obstruction or gangrene, not specified as recurrent: Secondary | ICD-10-CM

## 2017-05-28 DIAGNOSIS — M1990 Unspecified osteoarthritis, unspecified site: Secondary | ICD-10-CM | POA: Insufficient documentation

## 2017-05-28 DIAGNOSIS — I130 Hypertensive heart and chronic kidney disease with heart failure and stage 1 through stage 4 chronic kidney disease, or unspecified chronic kidney disease: Secondary | ICD-10-CM | POA: Diagnosis not present

## 2017-05-28 DIAGNOSIS — Z79899 Other long term (current) drug therapy: Secondary | ICD-10-CM | POA: Diagnosis not present

## 2017-05-28 DIAGNOSIS — D176 Benign lipomatous neoplasm of spermatic cord: Secondary | ICD-10-CM | POA: Diagnosis not present

## 2017-05-28 DIAGNOSIS — N183 Chronic kidney disease, stage 3 (moderate): Secondary | ICD-10-CM | POA: Diagnosis not present

## 2017-05-28 DIAGNOSIS — I259 Chronic ischemic heart disease, unspecified: Secondary | ICD-10-CM | POA: Diagnosis not present

## 2017-05-28 DIAGNOSIS — E785 Hyperlipidemia, unspecified: Secondary | ICD-10-CM | POA: Insufficient documentation

## 2017-05-28 DIAGNOSIS — Z7982 Long term (current) use of aspirin: Secondary | ICD-10-CM | POA: Insufficient documentation

## 2017-05-28 HISTORY — PX: INGUINAL HERNIA REPAIR: SHX194

## 2017-05-28 HISTORY — DX: Unilateral femoral hernia, without obstruction or gangrene, not specified as recurrent: K41.90

## 2017-05-28 HISTORY — DX: Dyspnea, unspecified: R06.00

## 2017-05-28 HISTORY — DX: Unilateral inguinal hernia, without obstruction or gangrene, not specified as recurrent: K40.90

## 2017-05-28 HISTORY — PX: INSERTION OF MESH: SHX5868

## 2017-05-28 SURGERY — REPAIR, HERNIA, INGUINAL, LAPAROSCOPIC
Anesthesia: General | Site: Groin | Laterality: Bilateral

## 2017-05-28 MED ORDER — CEFAZOLIN SODIUM-DEXTROSE 2-4 GM/100ML-% IV SOLN
2.0000 g | INTRAVENOUS | Status: AC
  Administered 2017-05-28: 2 g via INTRAVENOUS
  Filled 2017-05-28: qty 100

## 2017-05-28 MED ORDER — SODIUM CHLORIDE 0.9% FLUSH
3.0000 mL | INTRAVENOUS | Status: DC | PRN
  Filled 2017-05-28: qty 3

## 2017-05-28 MED ORDER — PROPOFOL 10 MG/ML IV BOLUS
INTRAVENOUS | Status: AC
Start: 2017-05-28 — End: 2017-05-28
  Filled 2017-05-28: qty 40

## 2017-05-28 MED ORDER — FENTANYL CITRATE (PF) 100 MCG/2ML IJ SOLN
25.0000 ug | INTRAMUSCULAR | Status: DC | PRN
  Filled 2017-05-28: qty 1

## 2017-05-28 MED ORDER — ROCURONIUM BROMIDE 10 MG/ML (PF) SYRINGE
PREFILLED_SYRINGE | INTRAVENOUS | Status: DC | PRN
  Administered 2017-05-28 (×2): 10 mg via INTRAVENOUS
  Administered 2017-05-28: 50 mg via INTRAVENOUS

## 2017-05-28 MED ORDER — TRAMADOL HCL 50 MG PO TABS: 50.0000 mg | ORAL_TABLET | Freq: Four times a day (QID) | ORAL | 0 refills | Status: DC | PRN

## 2017-05-28 MED ORDER — LIDOCAINE 2% (20 MG/ML) 5 ML SYRINGE
INTRAMUSCULAR | Status: DC | PRN
  Administered 2017-05-28: 1.5 mg/kg/h via INTRAVENOUS

## 2017-05-28 MED ORDER — ACETAMINOPHEN 650 MG RE SUPP
650.0000 mg | RECTAL | Status: DC | PRN
  Filled 2017-05-28: qty 1

## 2017-05-28 MED ORDER — ACETAMINOPHEN 500 MG PO TABS
1000.0000 mg | ORAL_TABLET | ORAL | Status: AC
  Administered 2017-05-28: 1000 mg via ORAL
  Filled 2017-05-28: qty 2

## 2017-05-28 MED ORDER — METHOCARBAMOL 500 MG PO TABS
1000.0000 mg | ORAL_TABLET | Freq: Four times a day (QID) | ORAL | Status: DC | PRN
  Filled 2017-05-28: qty 2

## 2017-05-28 MED ORDER — GABAPENTIN 300 MG PO CAPS
300.0000 mg | ORAL_CAPSULE | ORAL | Status: AC
  Administered 2017-05-28: 300 mg via ORAL
  Filled 2017-05-28: qty 1

## 2017-05-28 MED ORDER — PROPOFOL 10 MG/ML IV BOLUS
INTRAVENOUS | Status: DC | PRN
  Administered 2017-05-28: 130 mg via INTRAVENOUS

## 2017-05-28 MED ORDER — SUGAMMADEX SODIUM 200 MG/2ML IV SOLN
INTRAVENOUS | Status: DC | PRN
  Administered 2017-05-28: 150 mg via INTRAVENOUS

## 2017-05-28 MED ORDER — FENTANYL CITRATE (PF) 100 MCG/2ML IJ SOLN
INTRAMUSCULAR | Status: DC | PRN
  Administered 2017-05-28 (×5): 25 ug via INTRAVENOUS
  Administered 2017-05-28: 50 ug via INTRAVENOUS

## 2017-05-28 MED ORDER — SODIUM CHLORIDE 0.9 % IV SOLN
250.0000 mL | INTRAVENOUS | Status: DC | PRN
  Filled 2017-05-28: qty 250

## 2017-05-28 MED ORDER — DEXAMETHASONE SODIUM PHOSPHATE 10 MG/ML IJ SOLN
INTRAMUSCULAR | Status: DC | PRN
  Administered 2017-05-28: 8 mg via INTRAVENOUS

## 2017-05-28 MED ORDER — METHOCARBAMOL 1000 MG/10ML IJ SOLN
1000.0000 mg | Freq: Four times a day (QID) | INTRAVENOUS | Status: DC | PRN
  Filled 2017-05-28: qty 10

## 2017-05-28 MED ORDER — DEXAMETHASONE SODIUM PHOSPHATE 10 MG/ML IJ SOLN
INTRAMUSCULAR | Status: AC
  Filled 2017-05-28: qty 1

## 2017-05-28 MED ORDER — LIDOCAINE 2% (20 MG/ML) 5 ML SYRINGE
INTRAMUSCULAR | Status: AC
  Filled 2017-05-28: qty 5

## 2017-05-28 MED ORDER — LIDOCAINE 2% (20 MG/ML) 5 ML SYRINGE
INTRAMUSCULAR | Status: DC | PRN
  Administered 2017-05-28: 60 mg via INTRAVENOUS

## 2017-05-28 MED ORDER — ACETAMINOPHEN 325 MG PO TABS
650.0000 mg | ORAL_TABLET | ORAL | Status: DC | PRN
  Filled 2017-05-28: qty 2

## 2017-05-28 MED ORDER — SODIUM CHLORIDE 0.9% FLUSH
3.0000 mL | Freq: Two times a day (BID) | INTRAVENOUS | Status: DC
Start: 2017-05-28 — End: 2017-05-28
  Filled 2017-05-28: qty 3

## 2017-05-28 MED ORDER — SODIUM CHLORIDE 0.9% FLUSH
3.0000 mL | Freq: Two times a day (BID) | INTRAVENOUS | Status: DC
  Filled 2017-05-28: qty 3

## 2017-05-28 MED ORDER — ONDANSETRON HCL 4 MG/2ML IJ SOLN
INTRAMUSCULAR | Status: AC
  Filled 2017-05-28: qty 2

## 2017-05-28 MED ORDER — LIDOCAINE 2% (20 MG/ML) 5 ML SYRINGE
INTRAMUSCULAR | Status: AC
  Filled 2017-05-28: qty 10

## 2017-05-28 MED ORDER — GABAPENTIN 300 MG PO CAPS
ORAL_CAPSULE | ORAL | Status: AC
  Filled 2017-05-28: qty 1

## 2017-05-28 MED ORDER — ARTIFICIAL TEARS OPHTHALMIC OINT
TOPICAL_OINTMENT | OPHTHALMIC | Status: AC
  Filled 2017-05-28: qty 3.5

## 2017-05-28 MED ORDER — PHENYLEPHRINE HCL 10 MG/ML IJ SOLN
INTRAVENOUS | Status: DC | PRN
  Administered 2017-05-28: 20 ug/min via INTRAVENOUS

## 2017-05-28 MED ORDER — BUPIVACAINE-EPINEPHRINE 0.25% -1:200000 IJ SOLN
INTRAMUSCULAR | Status: DC | PRN
  Administered 2017-05-28: 50 mL

## 2017-05-28 MED ORDER — CHLORHEXIDINE GLUCONATE CLOTH 2 % EX PADS
6.0000 | MEDICATED_PAD | Freq: Once | CUTANEOUS | Status: DC
  Filled 2017-05-28: qty 6

## 2017-05-28 MED ORDER — ACETAMINOPHEN 500 MG PO TABS
ORAL_TABLET | ORAL | Status: AC
  Filled 2017-05-28: qty 2

## 2017-05-28 MED ORDER — LACTATED RINGERS IV SOLN
INTRAVENOUS | Status: DC
  Administered 2017-05-28 (×2): via INTRAVENOUS
  Filled 2017-05-28: qty 1000

## 2017-05-28 MED ORDER — PROMETHAZINE HCL 25 MG/ML IJ SOLN
6.2500 mg | INTRAMUSCULAR | Status: DC | PRN
  Filled 2017-05-28: qty 1

## 2017-05-28 MED ORDER — FENTANYL CITRATE (PF) 100 MCG/2ML IJ SOLN
INTRAMUSCULAR | Status: AC
  Filled 2017-05-28: qty 2

## 2017-05-28 MED ORDER — KETOROLAC TROMETHAMINE 30 MG/ML IJ SOLN
15.0000 mg | Freq: Once | INTRAMUSCULAR | Status: DC | PRN
  Filled 2017-05-28: qty 1

## 2017-05-28 MED ORDER — OXYCODONE HCL 5 MG PO TABS
5.0000 mg | ORAL_TABLET | ORAL | Status: DC | PRN
  Filled 2017-05-28: qty 2

## 2017-05-28 MED ORDER — PHENYLEPHRINE HCL 10 MG/ML IJ SOLN
INTRAMUSCULAR | Status: AC
  Filled 2017-05-28: qty 2

## 2017-05-28 MED ORDER — SUGAMMADEX SODIUM 200 MG/2ML IV SOLN
INTRAVENOUS | Status: AC
  Filled 2017-05-28: qty 2

## 2017-05-28 MED ORDER — PHENYLEPHRINE 40 MCG/ML (10ML) SYRINGE FOR IV PUSH (FOR BLOOD PRESSURE SUPPORT)
PREFILLED_SYRINGE | INTRAVENOUS | Status: DC | PRN
  Administered 2017-05-28: 80 ug via INTRAVENOUS

## 2017-05-28 MED ORDER — CEFAZOLIN SODIUM-DEXTROSE 2-4 GM/100ML-% IV SOLN
INTRAVENOUS | Status: AC
  Filled 2017-05-28: qty 100

## 2017-05-28 MED ORDER — ROCURONIUM BROMIDE 10 MG/ML (PF) SYRINGE
PREFILLED_SYRINGE | INTRAVENOUS | Status: AC
  Filled 2017-05-28: qty 5

## 2017-05-28 MED ORDER — ONDANSETRON HCL 4 MG/2ML IJ SOLN
INTRAMUSCULAR | Status: DC | PRN
  Administered 2017-05-28: 4 mg via INTRAVENOUS

## 2017-05-28 SURGICAL SUPPLY — 42 items
APPLICATOR COTTON TIP 6IN STRL (MISCELLANEOUS) ×1 IMPLANT
BLADE SURG 11 STRL SS (BLADE) ×2 IMPLANT
CABLE HIGH FREQUENCY MONO STRZ (ELECTRODE) ×2 IMPLANT
CANISTER SUCT 3000ML PPV (MISCELLANEOUS) IMPLANT
CHLORAPREP W/TINT 26ML (MISCELLANEOUS) ×2 IMPLANT
COVER BACK TABLE 60X90IN (DRAPES) ×2 IMPLANT
COVER MAYO STAND STRL (DRAPES) ×2 IMPLANT
DECANTER SPIKE VIAL GLASS SM (MISCELLANEOUS) ×2 IMPLANT
DEVICE SECURE STRAP 25 ABSORB (INSTRUMENTS) IMPLANT
DRAPE LAPAROSCOPIC ABDOMINAL (DRAPES) ×2 IMPLANT
DRAPE UTILITY XL STRL (DRAPES) ×2 IMPLANT
DRAPE WARM FLUID 44X44 (DRAPE) ×2 IMPLANT
DRSG TEGADERM 2-3/8X2-3/4 SM (GAUZE/BANDAGES/DRESSINGS) ×4 IMPLANT
DRSG TEGADERM 4X4.75 (GAUZE/BANDAGES/DRESSINGS) ×2 IMPLANT
ELECT REM PT RETURN 9FT ADLT (ELECTROSURGICAL) ×2
ELECTRODE REM PT RTRN 9FT ADLT (ELECTROSURGICAL) ×1 IMPLANT
GLOVE ECLIPSE 8.0 STRL XLNG CF (GLOVE) ×2 IMPLANT
GLOVE INDICATOR 8.0 STRL GRN (GLOVE) ×2 IMPLANT
GOWN STRL REUS W/TWL XL LVL3 (GOWN DISPOSABLE) ×2 IMPLANT
IRRIG SUCT STRYKERFLOW 2 WTIP (MISCELLANEOUS)
IRRIGATION SUCT STRKRFLW 2 WTP (MISCELLANEOUS) IMPLANT
KIT RM TURNOVER CYSTO AR (KITS) ×2 IMPLANT
MANIFOLD NEPTUNE II (INSTRUMENTS) ×1 IMPLANT
MESH ULTRAPRO 6X6 15CM15CM (Mesh General) ×3 IMPLANT
NEEDLE HYPO 22GX1.5 SAFETY (NEEDLE) ×2 IMPLANT
NS IRRIG 500ML POUR BTL (IV SOLUTION) ×2 IMPLANT
PACK BASIN DAY SURGERY FS (CUSTOM PROCEDURE TRAY) ×2 IMPLANT
PAD POSITIONING PINK XL (MISCELLANEOUS) ×2 IMPLANT
SCISSORS LAP 5X35 DISP (ENDOMECHANICALS) ×2 IMPLANT
SLEEVE ADV FIXATION 5X100MM (TROCAR) ×2 IMPLANT
SPONGE GAUZE 2X2 8PLY STRL LF (GAUZE/BANDAGES/DRESSINGS) ×3 IMPLANT
SUT MNCRL AB 4-0 PS2 18 (SUTURE) ×2 IMPLANT
SUT VIC AB 2-0 SH 27 (SUTURE)
SUT VIC AB 2-0 SH 27X BRD (SUTURE) IMPLANT
SUT VICRYL 0 UR6 27IN ABS (SUTURE) ×2 IMPLANT
SYR 20CC LL (SYRINGE) ×4 IMPLANT
TOWEL OR 17X24 6PK STRL BLUE (TOWEL DISPOSABLE) ×4 IMPLANT
TRAY DSU PREP LF (CUSTOM PROCEDURE TRAY) ×2 IMPLANT
TROCAR ADV FIXATION 5X100MM (TROCAR) ×2 IMPLANT
TROCAR XCEL BLUNT TIP 100MML (ENDOMECHANICALS) ×2 IMPLANT
TUBING INSUF HEATED (TUBING) ×2 IMPLANT
WATER STERILE IRR 500ML POUR (IV SOLUTION) ×2 IMPLANT

## 2017-05-28 NOTE — Anesthesia Procedure Notes (Signed)
Procedure Name: Intubation Date/Time: 05/28/2017 9:48 AM Performed by: Myrtie Soman, MD Pre-anesthesia Checklist: Patient identified, Emergency Drugs available, Suction available and Patient being monitored Patient Re-evaluated:Patient Re-evaluated prior to induction Oxygen Delivery Method: Circle system utilized Preoxygenation: Pre-oxygenation with 100% oxygen Induction Type: IV induction Ventilation: Mask ventilation without difficulty Laryngoscope Size: Mac and 4 Grade View: Grade I Tube type: Oral Tube size: 8.0 mm Number of attempts: 1 Airway Equipment and Method: Stylet and Oral airway Placement Confirmation: ETT inserted through vocal cords under direct vision,  positive ETCO2 and breath sounds checked- equal and bilateral Secured at: 23 cm Tube secured with: Tape Dental Injury: Teeth and Oropharynx as per pre-operative assessment

## 2017-05-28 NOTE — Anesthesia Postprocedure Evaluation (Signed)
Anesthesia Post Note  Patient: Isaac [name]  Procedure(s) Performed: LAPAROSCOPIC RIGHT AND LEFT INGUINAL HERNIA REPAIR WITH MESH (Bilateral Groin) INSERTION OF MESH (Bilateral Groin)     Patient location during evaluation: PACU Anesthesia Type: General Level of consciousness: awake and alert Pain management: pain level controlled Vital Signs Assessment: post-procedure vital signs reviewed and stable Respiratory status: spontaneous breathing, nonlabored ventilation and respiratory function stable Cardiovascular status: blood pressure returned to baseline and stable Postop Assessment: no apparent nausea or vomiting Anesthetic complications: no    Last Vitals:  Vitals:   05/28/17 1309 05/28/17 1315  BP:  (!) 148/75  Pulse: 71 66  Resp: 17 16  Temp:    SpO2: 100% 99%    Last Pain:  Vitals:   05/28/17 1300  TempSrc:   PainSc: Asleep                 Lynda Rainwater

## 2017-05-28 NOTE — Anesthesia Preprocedure Evaluation (Addendum)
Anesthesia Evaluation  Patient identified by MRN, date of birth, ID band Patient awake    Reviewed: Allergy & Precautions, NPO status , Patient's Chart, lab work & pertinent test results  Airway Mallampati: II  TM Distance: >3 FB Neck ROM: Full    Dental  (+) Partial Lower, Dental Advisory Given   Pulmonary neg pulmonary ROS,    Pulmonary exam normal breath sounds clear to auscultation       Cardiovascular hypertension, + CAD, + Past MI and + CABG  + Valvular Problems/Murmurs AI and MR  Rhythm:Regular Rate:Normal + Systolic murmurs Stress ECG conclusions: There were no stress arrhythmias or   conduction abnormalities. The stress ECG was normal. - Staged echo: There was no echocardiographic evidence for   stress-induced ischemia. Low risk. - Baseline: LV global systolic function was normal. The estimated   LV ejection fraction was 55%. Akinesis of the basalinferior LV   myocardium. - Peak stress: LV global systolic function was hyperdynamic. The   estimated LV ejection fraction was 75%. Akinesis of the basal   inferior LV myocardium.   Neuro/Psych negative neurological ROS  negative psych ROS   GI/Hepatic Neg liver ROS, GERD  ,  Endo/Other  negative endocrine ROS  Renal/GU Renal InsufficiencyRenal disease  negative genitourinary   Musculoskeletal negative musculoskeletal ROS (+)   Abdominal   Peds negative pediatric ROS (+)  Hematology negative hematology ROS (+)   Anesthesia Other Findings   Reproductive/Obstetrics negative OB ROS                            Anesthesia Physical Anesthesia Plan  ASA: III  Anesthesia Plan: General   Post-op Pain Management:    Induction: Intravenous  PONV Risk Score and Plan: 2 and Ondansetron, Dexamethasone and Treatment may vary due to age or medical condition  Airway Management Planned: Oral ETT  Additional Equipment:   Intra-op Plan:    Post-operative Plan: Extubation in OR  Informed Consent: I have reviewed the patients History and Physical, chart, labs and discussed the procedure including the risks, benefits and alternatives for the proposed anesthesia with the patient or authorized representative who has indicated his/her understanding and acceptance.   Dental advisory given  Plan Discussed with: CRNA and Surgeon  Anesthesia Plan Comments:         Anesthesia Quick Evaluation

## 2017-05-28 NOTE — Interval H&P Note (Signed)
History and Physical Interval Note:  05/28/2017 9:25 AM  Isaac [name]  has presented today for surgery, with the diagnosis of RIGHT AND POSSIBLE LEFT INGUINAL NEW HERNIAS  The various methods of treatment have been discussed with the patient and family. After consideration of risks, benefits and other options for treatment, the patient has consented to  Procedure(s): LAPAROSCOPIC RIGHT AND POSSIBLE LEFT INGUINAL HERNIA REPAIR WITH MESH (Right) INSERTION OF MESH (N/A) as a surgical intervention .  The patient's history has been reviewed, patient examined, no change in status, stable for surgery.  I have reviewed the patient's chart and labs.  Questions were answered to the patient's satisfaction.    I have re-reviewed the the patient's records, history, medications, and allergies.  I have re-examined the patient.  I again discussed intraoperative plans and goals of post-operative recovery.  The patient agrees to proceed.  Isaac [name]  12/23/1940 [id number]  Patient Care Team: Dettinger, Fransisca Kaufmann, MD as PCP - General (Family Medicine) Michael Boston, MD as Consulting Physician (General Surgery) Evans Lance, MD as Consulting Physician (Cardiology)  Patient Active Problem List   Diagnosis Date Noted  . CAP (community acquired pneumonia) 04/16/2016  . Cerumen impaction 06/05/2015  . Stage 3 chronic kidney disease ([place]) 05/22/2015  . Arthritis 06/04/2014  . CHF (congestive heart failure), NYHA class III ([place]) 02/20/2014  . BPH (benign prostatic hyperplasia) 02/20/2014  . T wave inversion in EKG 02/20/2014  . CAD (coronary artery disease) of artery bypass graft, Occluded VG-non dominant LCX medical therapy 11/25/2013  . Essential hypertension   . Unstable angina ([place]) 11/23/2013  . Dyspnea on exertion 11/08/2013  . Barrett's esophagus 10/27/2011  . GERD (gastroesophageal reflux disease) 07/14/2011  . CAD (coronary artery disease), native coronary artery - LIMA-LAD, SVG-Diag, SVG-RCA  2002   . Hyperlipidemia LDL goal <70   . Hypertensive heart disease     Past Medical History:  Diagnosis Date  . Allergic rhinitis   . Allergy   . CAD (coronary artery disease) of artery bypass graft, Occluded VG-non dominant LCX medical therapy 11/25/2013  . Cataract   . Dyspnea    with exertion   . GERD (gastroesophageal reflux disease)   . H/O hiatal hernia   . History of skin cancer    bilateral arms with removal  . Hyperlipidemia   . Hypertension   . Ischemic heart disease   . Lumbar disc disease   . Myocardial infarction [facility]) 2002    Past Surgical History:  Procedure Laterality Date  . COLONOSCOPY    . CORONARY ARTERY BYPASS GRAFT  2002   LIMA-LAD, SVG-Diag, SVG-RCA  . EYE SURGERY Bilateral 1995, 2000   ioc for cataracts  . GROIN DISSECTION  01/23/2012   Procedure: Virl Son EXPLORATION;  Surgeon: Harl Bowie, MD;  Location: [facility]S;  Service: General;  Laterality: Left;  Left Inguinal Exploration, release of scar tissue, Placement of Mesh  . INGUINAL HERNIA REPAIR Left 1983  . LEFT HEART CATHETERIZATION WITH CORONARY ANGIOGRAM N/A 11/24/2013   Procedure: LEFT HEART CATHETERIZATION WITH CORONARY ANGIOGRAM;  Surgeon: Peter M Martinique, MD;  Location: [facility] CATH LAB;  Service: Cardiovascular;  Laterality: N/A;  . NOSE SURGERY  1985    Social History   Socioeconomic History  . Marital status: Married    Spouse name: [name]  . Number of children: 0  . Years of education: Not on file  . Highest education level: 11th grade  Social Needs  . Financial resource strain:  Not very hard  . Food insecurity - worry: Never true  . Food insecurity - inability: Never true  . Transportation needs - medical: No  . Transportation needs - non-medical: No  Occupational History  . Occupation: Retired    Fish farm manager: [employer]  Tobacco Use  . Smoking status: Never Smoker  . Smokeless tobacco: Former Systems developer    Types: Chew  Substance and Sexual Activity  . Alcohol use: No     Alcohol/week: 0.0 oz  . Drug use: No  . Sexual activity: No  Other Topics Concern  . Not on file  Social History Narrative   Remarried in Aug 20, 2003 after first wife died of emphysema in 08-19-2001. 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 He lives in a one story home with his wife. They raising 2 great grandchildren. One is 31 months old and the other is 77 years old. He has no biological children.     Family History  Problem Relation Age of Onset  . Breast cancer Mother   . Diabetes Mother   . Hypertension Father   . Dementia Father 35       early 42s  . Diabetes Sister   . COPD Brother   . Colon cancer Neg Hx     Medications Prior to Admission  Medication Sig Dispense Refill Last Dose  . aspirin (ADULT ASPIRIN EC LOW STRENGTH) 81 MG EC tablet Take 81 mg by mouth every morning.    05/27/2017 at Unknown time  . Cholecalciferol (VITAMIN D) 2000 UNITS tablet Take 2,000 Units by mouth daily.    05/27/2017 at Unknown time  . docusate sodium (COLACE) 100 MG capsule Take 1 capsule by mouth every other day   05/27/2017 at Unknown time  . doxazosin (CARDURA) 4 MG tablet TAKE 1 TABLET BY MOUTH IN THE MORNING AND 1 TABLET AT BEDTIME   05/27/2017 at Unknown time  . fluticasone (FLONASE) 50 MCG/ACT nasal spray Place 2 sprays into both nostrils daily as needed for allergies or rhinitis.    05/27/2017 at Unknown time  . isosorbide mononitrate (IMDUR) 30 MG 24 hr tablet Take 30 mg by mouth daily.   05/27/2017 at Unknown time  . metoprolol succinate (TOPROL-XL) 50 MG 24 hr tablet TAKE ONE TABLET BY MOUTH IN THE MORNING AND ONE-HALF IN THE EVENING 45 tablet 5 05/27/2017 at 1000  . Multiple Vitamin (MULTIVITAMIN WITH MINERALS) TABS Take 1 tablet by mouth daily.    05/27/2017 at Unknown time  . multivitamin-lutein (OCUVITE-LUTEIN) CAPS capsule Take 1 capsule by mouth daily.   05/27/2017 at Unknown time  . MYRBETRIQ 50 MG TB24 tablet  Take 1 tablet by mouth daily.   Past Month at Unknown time  . omeprazole (PRILOSEC) 20 MG capsule Take 1 capsule (20 mg total) by mouth 2 (two) times daily. 60 capsule 5 05/27/2017 at Unknown time  . oxymetazoline (AFRIN) 0.05 % nasal spray Place 2 sprays into the nose every 12 (twelve) hours as needed for congestion (x3 days). Dryness   05/27/2017 at Unknown time  . simvastatin (ZOCOR) 20 MG tablet Take 1 tablet (20 mg total) by mouth at bedtime. 90 tablet 0 05/27/2017 at Unknown time  . sulindac (CLINORIL) 200 MG tablet Take up to twice daily with meals as needed for joint pain 60 tablet 11 05/27/2017 at Unknown time  . traMADol (ULTRAM) 50 MG tablet Take 1 tablet (50 mg total) by mouth  every 6 (six) hours as needed. 15 tablet 0 05/27/2017 at Unknown time  . furosemide (LASIX) 40 MG tablet Take 40 mg by mouth daily as needed for fluid or edema.   05/26/2017  . KLOR-CON M20 20 MEQ tablet TAKE ONE TABLET BY MOUTH ONCE DAILY WHEN  YOU  TAKE LASIX 30 tablet 3 05/26/2017  . nitroGLYCERIN (NITROSTAT) 0.4 MG SL tablet Place 1 tablet (0.4 mg total) under the tongue every 5 (five) minutes as needed for chest pain (MAX 3 TABLETS). 10 tablet 2 More than a month at Unknown time  . Simethicone (GAS-X EXTRA STRENGTH) 125 MG CAPS Take by mouth as directed. Reported on 10/10/2015 one after each meal   Unknown at Unknown time    Current Facility-Administered Medications  Medication Dose Route Frequency Provider Last Rate Last Dose  . ceFAZolin (ANCEF) IVPB 2g/100 mL premix  2 g Intravenous On Call to OR Michael Boston, MD      . Chlorhexidine Gluconate Cloth 2 % PADS 6 each  6 each Topical Once Michael Boston, MD       And  . Chlorhexidine Gluconate Cloth 2 % PADS 6 each  6 each Topical Once Michael Boston, MD      . lactated ringers infusion   Intravenous Continuous Lyn Hollingshead, MD 50 mL/hr at 05/28/17 0725       No Known Allergies  BP 137/80   Pulse 74   Temp (!) 97.5 F (36.4 C) (Oral)   Resp 16   Ht 5'  10.5" (1.791 m)   Wt 76.6 kg (168 lb 14.4 oz)   SpO2 99%   BMI 23.89 kg/m   Labs: No results [name] for this or any previous visit (from the past 48 hour(s)).  Imaging / Studies: No results [name].   [name], M.D., F.A.C.S. Gastrointestinal and Minimally Invasive Surgery [facility] [address], [place], [place] [address] [phone] Main / Paging  05/28/2017 9:27 AM    [name]

## 2017-05-28 NOTE — Transfer of Care (Signed)
Immediate Anesthesia Transfer of Care Note  Patient: Isaac [name]  Procedure(s) Performed: Procedure(s) (LRB): LAPAROSCOPIC RIGHT AND LEFT INGUINAL HERNIA REPAIR WITH MESH (Bilateral) INSERTION OF MESH (Bilateral)  Patient Location: PACU  Anesthesia Type: General  Level of Consciousness: awake, oriented, sedated and patient cooperative  Airway & Oxygen Therapy: Patient Spontanous Breathing and Patient connected to face mask oxygen  Post-op Assessment: Report given to PACU RN and Post -op Vital signs reviewed and stable  Post vital signs: Reviewed and stable  Complications: No apparent anesthesia complications Last Vitals:  Vitals:   05/28/17 0643 05/28/17 1205  BP: 137/80 (!) (P) 180/79  Pulse: 74   Resp: 16   Temp: (!) 36.4 C (!) (P) 36.3 C  SpO2: 99%     Last Pain:  Vitals:   05/28/17 1205  TempSrc:   PainSc: (P) Asleep      Patients Stated Pain Goal: 4 (92/92/44 6286)  Complications: No anesthetic complications

## 2017-05-28 NOTE — Discharge Instructions (Signed)
HERNIA REPAIR: POST OP INSTRUCTIONS ° °###################################################################### ° °EAT °Gradually transition to a high fiber diet with a fiber supplement over the next few weeks after discharge.  Start with a pureed / full liquid diet (see below) ° °WALK °Walk an hour a day.  Control your pain to do that.   ° °CONTROL PAIN °Control pain so that you can walk, sleep, tolerate sneezing/coughing, go up/down stairs. ° °HAVE A BOWEL MOVEMENT DAILY °Keep your bowels regular to avoid problems.  OK to try a laxative to override constipation.  OK to use an antidairrheal to slow down diarrhea.  Call if not better after 2 tries ° °CALL IF YOU HAVE PROBLEMS/CONCERNS °Call if you are still struggling despite following these instructions. °Call if you have concerns not answered by these instructions ° °###################################################################### ° ° ° °1. DIET: Follow a light bland diet the first 24 hours after arrival home, such as soup, liquids, crackers, etc.  Be sure to include lots of fluids daily.  Avoid fast food or heavy meals as your are more likely to get nauseated.  Eat a low fat the next few days after surgery. °2. Take your usually prescribed home medications unless otherwise directed. °3. PAIN CONTROL: °a. Pain is best controlled by a usual combination of three different methods TOGETHER: °i. Ice/Heat °ii. Over the counter pain medication °iii. Prescription pain medication °b. Most patients will experience some swelling and bruising around the hernia(s) such as the bellybutton, groins, or old incisions.  Ice packs or heating pads (30-60 minutes up to 6 times a day) will help. Use ice for the first few days to help decrease swelling and bruising, then switch to heat to help relax tight/sore spots and speed recovery.  Some people prefer to use ice alone, heat alone, alternating between ice & heat.  Experiment to what works for you.  Swelling and bruising can take  several weeks to resolve.   °c. It is helpful to take an over-the-counter pain medication regularly for the first few weeks.  Choose one of the following that works best for you: °i. Naproxen (Aleve, etc)  Two 220mg tabs twice a day °ii. Ibuprofen (Advil, etc) Three 200mg tabs four times a day (every meal & bedtime) °iii. Acetaminophen (Tylenol, etc) 325-650mg four times a day (every meal & bedtime) °d. A  prescription for pain medication should be given to you upon discharge.  Take your pain medication as prescribed.  °i. If you are having problems/concerns with the prescription medicine (does not control pain, nausea, vomiting, rash, itching, etc), please call us [phone] to see if we need to switch you to a different pain medicine that will work better for you and/or control your side effect better. °ii. If you need a refill on your pain medication, please contact your pharmacy.  They will contact our office to request authorization. Prescriptions will not be filled after 5 pm or on week-ends. °4. Avoid getting constipated.  Between the surgery and the pain medications, it is common to experience some constipation.  Increasing fluid intake and taking a fiber supplement (such as Metamucil, Citrucel, FiberCon, MiraLax, etc) 1-2 times a day regularly will usually help prevent this problem from occurring.  A mild laxative (prune juice, Milk of Magnesia, MiraLax, etc) should be taken according to package directions if there are no bowel movements after 48 hours.   °5. Wash / shower every day.  You may shower over the dressings as they are waterproof.   °6. Remove   your waterproof bandages 5 days after surgery.  You may leave the incision open to air.  You may replace a dressing/Band-Aid to cover the incision for comfort if you wish.  Continue to shower over incision(s) after the dressing is off.    7. ACTIVITIES as tolerated:   a. You may resume regular (light) daily activities beginning the next day--such  as daily self-care, walking, climbing stairs--gradually increasing activities as tolerated.  If you can walk 30 minutes without difficulty, it is safe to try more intense activity such as jogging, treadmill, bicycling, low-impact aerobics, swimming, etc. b. Save the most intensive and strenuous activity for last such as sit-ups, heavy lifting, contact sports, etc  Refrain from any heavy lifting or straining until you are off narcotics for pain control.   c. DO NOT PUSH THROUGH PAIN.  Let pain be your guide: If it hurts to do something, don't do it.  Pain is your body warning you to avoid that activity for another week until the pain goes down. d. You may drive when you are no longer taking prescription pain medication, you can comfortably wear a seatbelt, and you can safely maneuver your car and apply brakes. e. Dennis Bast may have sexual intercourse when it is comfortable.  8. FOLLOW UP in our office a. Please call [facility] at [phone] to set up an appointment to see your surgeon in the office for a follow-up appointment approximately 2-3 weeks after your surgery. b. Make sure that you call for this appointment the day you arrive home to insure a convenient appointment time. 9.  IF YOU HAVE DISABILITY OR FAMILY LEAVE FORMS, BRING THEM TO THE OFFICE FOR PROCESSING.  DO NOT GIVE THEM TO YOUR DOCTOR.  WHEN TO CALL us [phone]: 1. Poor pain control 2. Reactions / problems with new medications (rash/itching, nausea, etc)  3. Fever over 101.5 F (38.5 C) 4. Inability to urinate 5. Nausea and/or vomiting 6. Worsening swelling or bruising 7. Continued bleeding from incision. 8. Increased pain, redness, or drainage from the incision   The clinic staff is available to answer your questions during regular business hours (8:30am-5pm).  Please dont hesitate to call and ask to speak to one of our nurses for clinical concerns.   If you have a medical emergency, go to the nearest emergency room or call  911.  A surgeon from [facility] is always on call at the hospitals in [facility], [place], [place], [place], [place]  [id number] ?  [address] [address], [place], [place]   [address] MAIN: [phone] ? TOLL FREE: [phone] ? FAX: [phone] [email or web address]   Post Anesthesia Home Care Instructions  Activity: Get plenty of rest for the remainder of the day. A responsible individual must stay with you for 24 hours following the procedure.  For the next 24 hours, DO NOT: -Drive a car -Paediatric nurse -Drink alcoholic beverages -Take any medication unless instructed by your physician -Make any legal decisions or sign important papers.  Meals: Start with liquid foods such as gelatin or soup. Progress to regular foods as tolerated. Avoid greasy, spicy, heavy foods. If nausea and/or vomiting occur, drink only clear liquids until the nausea and/or vomiting subsides. Call your physician if vomiting continues.  Special Instructions/Symptoms: Your throat may feel dry or sore from the anesthesia or the breathing tube placed in your throat during surgery. If this causes discomfort, gargle with warm salt water. The discomfort should disappear  within 24 hours.  If you had a scopolamine patch placed behind your ear for the management of post- operative nausea and/or vomiting:  1. The medication in the patch is effective for 72 hours, after which it should be removed.  Wrap patch in a tissue and discard in the trash. Wash hands thoroughly with soap and water. 2. You may remove the patch earlier than 72 hours if you experience unpleasant side effects which may include dry mouth, dizziness or visual disturbances. 3. Avoid touching the patch. Wash your hands with soap and water after contact with the patch.

## 2017-05-28 NOTE — Op Note (Signed)
05/28/2017  11:44 AM  PATIENT:  Isaac [name]  77 y.o. male  Patient Care Team: Dettinger, Fransisca Kaufmann, MD as PCP - General (Family Medicine) Michael Boston, MD as Consulting Physician (General Surgery) Evans Lance, MD as Consulting Physician (Cardiology)  PRE-OPERATIVE DIAGNOSIS:  RIGHT INGUINAL NEW HERNIA LEFT GROIN PAIN, POSSIBLE HERNIA  POST-OPERATIVE DIAGNOSIS:    RIGHT INGUINAL HERNIA LEFT FEMORAL HERNIA  PROCEDURE:   LAPAROSCOPIC RIGHT INGUINAL HERNIA REPAIR WITH MESH LAPAROSCOPIC LEFT FEMORAL HERNIA REPAIR WITH MESH INSERTION OF MESH  SURGEON:  [name], MD  ASSISTANT: None  ANESTHESIA:     Regional ilioinguinal and genitofemoral and spermatic cord nerve blocks  General  EBL:  Total I/O In: 700 [I.V.:700] Out: - .  See anesthesia record  Delay start of Pharmacological VTE agent (>24hrs) due to surgical blood loss or risk of bleeding:  no  DRAINS: NONE  SPECIMEN:  NONE  DISPOSITION OF SPECIMEN:  N/A  COUNTS:  YES  PLAN OF CARE: Discharge to home after PACU  PATIENT DISPOSITION:  PACU - hemodynamically stable.  INDICATION: Elderly gentleman has history of left inguinal hernia.  Recurrence repaired open.  Some left groin pain.  New obvious right groin bulging suspicious for hernia.  I recommended laparoscopic repair of hiatal hernia.  Offered diagnostic laparoscopy on left side with nerve block.  The anatomy & physiology of the abdominal wall and pelvic floor was discussed.  The pathophysiology of hernias in the inguinal and pelvic region was discussed.  Natural history risks such as progressive enlargement, pain, incarceration & strangulation was discussed.   Contributors to complications such as smoking, obesity, diabetes, prior surgery, etc were discussed.    I feel the risks of no intervention will lead to serious problems that outweigh the operative risks; therefore, I recommended surgery to reduce and repair the hernia.  I explained laparoscopic  techniques with possible need for an open approach.  I noted usual use of mesh to patch and/or buttress hernia repair  Risks such as bleeding, infection, abscess, need for further treatment, heart attack, death, and other risks were discussed.  I noted a good likelihood this will help address the problem.   Goals of post-operative recovery were discussed as well.  Possibility that this will not correct all symptoms was explained.  I stressed the importance of low-impact activity, aggressive pain control, avoiding constipation, & not pushing through pain to minimize risk of post-operative chronic pain or injury. Possibility of reherniation was discussed.  We will work to minimize complications.     An educational handout further explaining the pathology & treatment options was given as well.  Questions were answered.  The patient expresses understanding & wishes to proceed with surgery.  OR FINDINGS: Right indirect inguinal hernia.  No direct space, femoral, nor obturator hernias  Intact left inguinal hernia repair.  New femoral hernia inferior with preperitoneal fat.  No major incarceration.  No obturator hernia.  DESCRIPTION:  The patient was identified & brought into the operating room. The patient was positioned supine with arms tucked. SCDs were active during the entire case. The patient underwent general anesthesia without any difficulty.  The abdomen was prepped and draped in a sterile fashion. The patient's bladder was emptied.  A Surgical Timeout confirmed our plan.  I made a transverse incision through the inferior umbilical fold.  I made a small transverse nick through the anterior rectus fascia contralateral to the inguinal hernia side and placed a 0-vicryl stitch through the fascia.  I placed a Hasson trocar into the preperitoneal plane.  Entry was clean.  We induced carbon dioxide insufflation. Camera inspection revealed no injury.  I used a 49mm angled scope to bluntly free the peritoneum  off the infraumbilical anterior abdominal wall.  I created enough of a preperitoneal pocket to place 51mm ports into the right & left mid-abdomen into this preperitoneal cavity.  I focused attention on the RIGHT pelvis since that was the dominant hernia side.   I used blunt & focused sharp dissection to free the peritoneum off the flank and down to the pubic rim.  I freed the anteriolateral bladder wall off the anteriolateral pelvic wall, sparing midline attachments.   I located a swath of peritoneum going into a hernia fascial defect at the  internal ring consistent with  an indirect inguinal hernia..  I gradually freed the peritoneal hernia sac off safely and reduced it into the preperitoneal space.  I freed the peritoneum off the spermatic vessels & vas deferens.  I freed peritoneum off the retroperitoneum along the psoas muscle.  Spermatic cord lipoma was dissected away & removed.  I checked & assured hemostasis.     I turned attention on the opposite  LEFT pelvis.  I did dissection in a similar, mirror-image fashion.  He had evidence of prior left inguinal hernia repair.  Mild laxity on the internal ring laterally but no real recurrence.  Direct space no hernia.  However, patient did have a moderate sized left femoral hernia.[name]    Spermatic cord lipoma was dissected away & removed.    I checked & assured hemostasis.     I chose 15x15 cm sheets of ultra-lightweight polypropylene mesh (Ultrapro), one for each side.  I cut a single sigmoid-shaped slit ~6cm from a corner of each mesh.  I placed the meshes into the preperitoneal space & laid them as overlapping diamonds such that at the inferior points, a 6x6 cm corner flap rested in the true anterolateral pelvis, covering the obturator & femoral foramina.   I allowed the bladder to return to the pubis, this helping tuck the corners of the mesh in the anteriolateral pelvis.  The medial corners overlapped each other across midline cephalad to the pubic rim.    Given the numerous hernias of moderate size, I placed a third 15x15cm mesh in the center as a vertical diamond.  The lateral wings of the mesh overlap across the direct spaces and internal rings where the dominant hernias were.  This provided good coverage and reinforcement of the hernia repairs.  Because of the central mesh placement with good overlap, I did not place any tacks.   I held the hernia sacs cephalad & evacuated carbon dioxide.  I closed the fascia with absorbable suture.  I closed the skin using 4-0 monocryl stitch.  Sterile dressings were applied.   The patient was extubated & arrived in the PACU in stable condition..  I had discussed postoperative care with the patient in the holding area.  Instructions are written in the chart.  I discussed operative findings, updated the patient's status, discussed probable steps to recovery, and gave postoperative recommendations to the patient's spouse.  Recommendations were made.  Questions were answered.  She expressed understanding & appreciation.   [name], M.D., F.A.C.S. Gastrointestinal and Minimally Invasive Surgery [facility] [address] [place], [place] [address] [phone] Main / Paging  05/28/2017 11:44 AM

## 2017-05-29 ENCOUNTER — Encounter (HOSPITAL_BASED_OUTPATIENT_CLINIC_OR_DEPARTMENT_OTHER): Payer: Self-pay | Admitting: Surgery

## 2017-07-08 ENCOUNTER — Other Ambulatory Visit: Payer: Self-pay | Admitting: Adult Health

## 2017-07-15 ENCOUNTER — Other Ambulatory Visit: Payer: Self-pay | Admitting: Internal Medicine

## 2017-07-15 MED ORDER — FUROSEMIDE 40 MG PO TABS: 40.0000 mg | ORAL_TABLET | Freq: Every day | ORAL | 2 refills | Status: DC | PRN

## 2017-07-23 ENCOUNTER — Encounter: Payer: Self-pay | Admitting: Internal Medicine

## 2017-08-10 ENCOUNTER — Other Ambulatory Visit: Payer: Self-pay | Admitting: Family Medicine

## 2017-08-10 MED ORDER — POTASSIUM CHLORIDE CRYS ER 20 MEQ PO TBCR: EXTENDED_RELEASE_TABLET | ORAL | 3 refills | Status: DC

## 2017-08-10 MED ORDER — DOXAZOSIN MESYLATE 4 MG PO TABS: 4.0000 mg | ORAL_TABLET | Freq: Two times a day (BID) | ORAL | 2 refills | Status: DC

## 2017-08-12 ENCOUNTER — Encounter: Payer: Self-pay | Admitting: Family Medicine

## 2017-08-12 ENCOUNTER — Ambulatory Visit (INDEPENDENT_AMBULATORY_CARE_PROVIDER_SITE_OTHER): Payer: Medicare Other | Admitting: Family Medicine

## 2017-08-12 VITALS — BP 188/82 | HR 72 | Temp 97.0°F | Ht 70.5 in | Wt 173.0 lb

## 2017-08-12 DIAGNOSIS — I10 Essential (primary) hypertension: Secondary | ICD-10-CM

## 2017-08-12 DIAGNOSIS — N183 Chronic kidney disease, stage 3 unspecified: Secondary | ICD-10-CM

## 2017-08-12 DIAGNOSIS — I5043 Acute on chronic combined systolic (congestive) and diastolic (congestive) heart failure: Secondary | ICD-10-CM | POA: Diagnosis not present

## 2017-08-12 DIAGNOSIS — I110 Hypertensive heart disease with heart failure: Secondary | ICD-10-CM | POA: Diagnosis not present

## 2017-08-12 DIAGNOSIS — I5042 Chronic combined systolic (congestive) and diastolic (congestive) heart failure: Secondary | ICD-10-CM | POA: Diagnosis not present

## 2017-08-12 DIAGNOSIS — E785 Hyperlipidemia, unspecified: Secondary | ICD-10-CM | POA: Diagnosis not present

## 2017-08-12 LAB — CMP14+EGFR
ALBUMIN: 4.2 g/dL (ref 3.5–4.8)
ALK PHOS: 82 IU/L (ref 39–117)
ALT: 14 IU/L (ref 0–44)
AST: 16 IU/L (ref 0–40)
Albumin/Globulin Ratio: 1.6 (ref 1.2–2.2)
BUN / CREAT RATIO: 12 (ref 10–24)
BUN: 20 mg/dL (ref 8–27)
Bilirubin Total: 0.4 mg/dL (ref 0.0–1.2)
CO2: 23 mmol/L (ref 20–29)
CREATININE: 1.61 mg/dL — AB (ref 0.76–1.27)
Calcium: 9 mg/dL (ref 8.6–10.2)
Chloride: 103 mmol/L (ref 96–106)
GFR calc Af Amer: 47 mL/min/{1.73_m2} — ABNORMAL LOW (ref >59)
GFR calc non Af Amer: 41 mL/min/{1.73_m2} — ABNORMAL LOW (ref >59)
GLUCOSE: 109 mg/dL — AB (ref 65–99)
Globulin, Total: 2.6 g/dL (ref 1.5–4.5)
Potassium: 4.3 mmol/L (ref 3.5–5.2)
Sodium: 141 mmol/L (ref 134–144)
Total Protein: 6.8 g/dL (ref 6.0–8.5)

## 2017-08-12 LAB — LIPID PANEL
CHOLESTEROL TOTAL: 147 mg/dL (ref 100–199)
Chol/HDL Ratio: 3.1 ratio (ref 0.0–5.0)
HDL: 48 mg/dL (ref >39)
LDL Calculated: 68 mg/dL (ref 0–99)
TRIGLYCERIDES: 157 mg/dL — AB (ref 0–149)
VLDL Cholesterol Cal: 31 mg/dL (ref 5–40)

## 2017-08-12 LAB — CBC WITH DIFFERENTIAL/PLATELET
BASOS ABS: 0.1 10*3/uL (ref 0.0–0.2)
Basos: 1 %
EOS (ABSOLUTE): 0.5 10*3/uL — ABNORMAL HIGH (ref 0.0–0.4)
Eos: 5 %
HEMOGLOBIN: 12.5 g/dL — AB (ref 13.0–17.7)
Hematocrit: 36.8 % — ABNORMAL LOW (ref 37.5–51.0)
IMMATURE GRANS (ABS): 0 10*3/uL (ref 0.0–0.1)
IMMATURE GRANULOCYTES: 0 %
LYMPHS: 23 %
Lymphocytes Absolute: 2 10*3/uL (ref 0.7–3.1)
MCH: 31.4 pg (ref 26.6–33.0)
MCHC: 34 g/dL (ref 31.5–35.7)
MCV: 93 fL (ref 79–97)
MONOCYTES: 9 %
Monocytes Absolute: 0.8 10*3/uL (ref 0.1–0.9)
NEUTROS PCT: 62 %
Neutrophils Absolute: 5.4 10*3/uL (ref 1.4–7.0)
Platelets: 171 10*3/uL (ref 150–379)
RBC: 3.98 x10E6/uL — AB (ref 4.14–5.80)
RDW: 14.5 % (ref 12.3–15.4)
WBC: 8.8 10*3/uL (ref 3.4–10.8)

## 2017-08-12 MED ORDER — DOXAZOSIN MESYLATE 4 MG PO TABS
4.0000 mg | ORAL_TABLET | Freq: Two times a day (BID) | ORAL | 5 refills | Status: DC
Start: 2017-08-12 — End: 2018-02-11

## 2017-08-12 MED ORDER — POTASSIUM CHLORIDE CRYS ER 20 MEQ PO TBCR: EXTENDED_RELEASE_TABLET | ORAL | 5 refills | Status: DC

## 2017-08-12 NOTE — Progress Notes (Signed)
BP (!) 188/82   Pulse 72   Temp (!) 97 F (36.1 C) (Oral)   Ht 5' 10.5" (1.791 m)   Wt 173 lb (78.5 kg)   BMI 24.47 kg/m    Subjective:    Patient ID: Isaac [name], male    DOB: 05/15/1940, 77 y.o.   MRN: [id number]  HPI: Isaac [name] is a 77 y.o. male presenting on 08/12/2017 for Hyperlipidemia and Hypertension   HPI Hyperlipidemia Patient is coming in for recheck of his hyperlipidemia. The patient is currently taking simvastatin. They deny any issues with myalgias or history of liver damage from it. They deny any focal numbness or weakness or chest pain.   Hypertension Patient is currently on Imdur and doxazosin and metoprolol, and their blood pressure today is 188/82. Patient denies any lightheadedness or dizziness. Patient denies headaches, blurred vision, chest pains, shortness of breath, or weakness. Denies any side effects from medication and is content with current medication.   CHF and chronic kidney disease Patient has cardiorenal syndrome and is coming in for checkup today.  He is weight is up about 4 pounds from where he was on his last visit and he does feel like he is a little swollen in his hands and has been having a little cough with chest congestion but does not feel like he has a cold.  He takes the Lasix as needed because of his chronic kidney disease and he says he will go home and take some doses in the next few days to help get the weight down.  He does monitor weights daily and does have a congestive heart failure team with the cardiology who manages this.  He does not feel short of breath at all or have any chest pain currently today.  Relevant past medical, surgical, family and social history reviewed and updated as indicated. Interim medical history since our last visit reviewed. Allergies and medications reviewed and updated.  Review of Systems  Constitutional: Negative for chills and fever.  HENT: Positive for congestion. Negative for rhinorrhea, sinus  pressure, sinus pain and sore throat.   Eyes: Negative for discharge.  Respiratory: Positive for cough. Negative for shortness of breath and wheezing.   Cardiovascular: Negative for chest pain, palpitations and leg swelling.  Musculoskeletal: Negative for back pain and gait problem.  Skin: Negative for rash.  Neurological: Negative for dizziness and light-headedness.  All other systems reviewed and are negative.   Per HPI unless specifically indicated above   Allergies as of 08/12/2017   No Known Allergies     Medication List        Accurate as of 08/12/17  8:13 AM. Always use your most recent med list.          ADULT ASPIRIN EC LOW STRENGTH 81 MG EC tablet Generic drug:  aspirin Take 81 mg by mouth every morning.   docusate sodium 100 MG capsule Commonly known as:  COLACE Take 1 capsule by mouth every other day   doxazosin 4 MG tablet Commonly known as:  CARDURA Take 1 tablet (4 mg total) by mouth 2 (two) times daily. TAKE 1 TABLET BY MOUTH IN THE MORNING AND 1 TABLET AT BEDTIME   fluticasone 50 MCG/ACT nasal spray Commonly known as:  FLONASE Place 2 sprays into both nostrils daily as needed for allergies or rhinitis.   furosemide 40 MG tablet Commonly known as:  LASIX Take 1 tablet (40 mg total) by mouth daily as needed for fluid  or edema.   GAS-X EXTRA STRENGTH 125 MG Caps Generic drug:  Simethicone Take by mouth as directed. Reported on 10/10/2015 one after each meal   isosorbide mononitrate 30 MG 24 hr tablet Commonly known as:  IMDUR Take 30 mg by mouth daily.   metoprolol succinate 50 MG 24 hr tablet Commonly known as:  TOPROL-XL TAKE ONE TABLET BY MOUTH IN THE MORNING AND ONE-HALF IN THE EVENING   multivitamin with minerals Tabs tablet Take 1 tablet by mouth daily.   multivitamin-lutein Caps capsule Take 1 capsule by mouth daily.   MYRBETRIQ 50 MG Tb24 tablet Generic drug:  mirabegron ER Take 1 tablet by mouth daily.   nitroGLYCERIN 0.4 MG SL  tablet Commonly known as:  NITROSTAT Place 1 tablet (0.4 mg total) under the tongue every 5 (five) minutes as needed for chest pain (MAX 3 TABLETS).   omeprazole 20 MG capsule Commonly known as:  PRILOSEC Take 1 capsule (20 mg total) by mouth 2 (two) times daily.   oxymetazoline 0.05 % nasal spray Commonly known as:  AFRIN Place 2 sprays into the nose every 12 (twelve) hours as needed for congestion (x3 days). Dryness   potassium chloride SA 20 MEQ tablet Commonly known as:  KLOR-CON M20 TAKE ONE TABLET BY MOUTH ONCE DAILY WHEN  YOU  TAKE LASIX   simvastatin 20 MG tablet Commonly known as:  ZOCOR Take 1 tablet (20 mg total) by mouth at bedtime.   Vitamin D 2000 units tablet Take 2,000 Units by mouth daily.          Objective:    BP (!) 188/82   Pulse 72   Temp (!) 97 F (36.1 C) (Oral)   Ht 5' 10.5" (1.791 m)   Wt 173 lb (78.5 kg)   BMI 24.47 kg/m   Wt Readings from Last 3 Encounters:  08/12/17 173 lb (78.5 kg)  05/28/17 168 lb 14.4 oz (76.6 kg)  05/19/17 168 lb (76.2 kg)    Physical Exam  Constitutional: He is oriented to person, place, and time. He appears well-developed and well-nourished. No distress.  Eyes: Conjunctivae are normal. Right eye exhibits no discharge. No scleral icterus.  Neck: Neck supple. No thyromegaly present.  Cardiovascular: Normal rate, regular rhythm, normal heart sounds and intact distal pulses.  No murmur heard. Pulmonary/Chest: Effort normal. No respiratory distress. He has no wheezes. He has rales (Bibasilar).  Musculoskeletal: Normal range of motion. He exhibits no edema.  Lymphadenopathy:    He has no cervical adenopathy.  Neurological: He is alert and oriented to person, place, and time. Coordination normal.  Skin: Skin is warm and dry. No rash noted. He is not diaphoretic.  Psychiatric: He has a normal mood and affect. His behavior is normal.  Nursing note and vitals reviewed.       Assessment & Plan:   Problem List Items  Addressed This Visit      Cardiovascular and Mediastinum   Hypertensive heart disease (Chronic)   Relevant Medications   doxazosin (CARDURA) 4 MG tablet   Other Relevant Orders   CMP14+EGFR   CBC with Differential/Platelet   Essential hypertension - Primary   Relevant Medications   doxazosin (CARDURA) 4 MG tablet   Other Relevant Orders   CMP14+EGFR   CHF (congestive heart failure), NYHA class III (HCC)   Relevant Medications   doxazosin (CARDURA) 4 MG tablet     Genitourinary   Stage 3 chronic kidney disease ([place])   Relevant Orders   CMP14+EGFR  CBC with Differential/Platelet     Other   Hyperlipidemia LDL goal <70 (Chronic)   Relevant Medications   doxazosin (CARDURA) 4 MG tablet   Other Relevant Orders   Lipid panel       Follow up plan: Return in about 6 months (around 02/11/2018), or if symptoms worsen or fail to improve, for Recheck hypertension and cholesterol.  Counseling provided for all of the vaccine components Orders Placed This Encounter  Procedures  . CMP14+EGFR  . CBC with Differential/Platelet  . Lipid panel    Caryl Pina, MD [facility] 08/12/2017, 8:13 AM

## 2017-08-31 ENCOUNTER — Ambulatory Visit: Payer: Medicare Other | Admitting: Internal Medicine

## 2017-10-16 ENCOUNTER — Encounter: Payer: Self-pay | Admitting: Gastroenterology

## 2017-10-21 ENCOUNTER — Encounter: Payer: Self-pay | Admitting: Gastroenterology

## 2017-12-11 ENCOUNTER — Ambulatory Visit: Payer: Medicare Other | Admitting: Internal Medicine

## 2017-12-11 ENCOUNTER — Ambulatory Visit (AMBULATORY_SURGERY_CENTER): Payer: Self-pay

## 2017-12-11 ENCOUNTER — Encounter: Payer: Self-pay | Admitting: Internal Medicine

## 2017-12-11 ENCOUNTER — Other Ambulatory Visit: Payer: Self-pay

## 2017-12-11 VITALS — Ht 70.5 in | Wt 176.4 lb

## 2017-12-11 VITALS — BP 124/58 | HR 72 | Ht 70.5 in | Wt 176.0 lb

## 2017-12-11 DIAGNOSIS — I10 Essential (primary) hypertension: Secondary | ICD-10-CM | POA: Diagnosis not present

## 2017-12-11 DIAGNOSIS — R0609 Other forms of dyspnea: Secondary | ICD-10-CM | POA: Diagnosis not present

## 2017-12-11 DIAGNOSIS — I2510 Atherosclerotic heart disease of native coronary artery without angina pectoris: Secondary | ICD-10-CM | POA: Diagnosis not present

## 2017-12-11 DIAGNOSIS — K22719 Barrett's esophagus with dysplasia, unspecified: Secondary | ICD-10-CM

## 2017-12-11 DIAGNOSIS — R0600 Dyspnea, unspecified: Secondary | ICD-10-CM

## 2017-12-11 MED ORDER — NITROGLYCERIN 0.4 MG SL SUBL: 0.4000 mg | SUBLINGUAL_TABLET | SUBLINGUAL | 2 refills | Status: DC | PRN

## 2017-12-11 MED ORDER — NITROGLYCERIN 0.4 MG SL SUBL: 0.4000 mg | SUBLINGUAL_TABLET | SUBLINGUAL | 6 refills | Status: DC | PRN

## 2017-12-11 NOTE — Progress Notes (Signed)
No egg or soy allergy known to patient  No issues with past sedation with any surgeries  or procedures, no intubation problems  No diet pills per patient No home 02 use per patient  No blood thinners per patient  Pt denies issues with constipation  No A fib or A flutter  EMMI video sent to pt's e mail , pt declined    

## 2017-12-11 NOTE — Progress Notes (Signed)
HPI Isaac [name] returns today for followup. He is a pleasant 77 yo man with CAD, s/p CABG 14 years ago, HTN, dyslipidemia, and underlying lung disease. He notes that he has had worsening sob, particularly if he walks up any incline. No peripheral edema. Previously he has had normal LV function. He is known to have 2 occluded vein grafts. He denies angina. He has a chronic cough but non-productive. He denies medical or dietary compliance problems. No other complaints. he has undergone a trip around the [place].  No Known Allergies   Current Outpatient Medications  Medication Sig Dispense Refill  . aspirin (ADULT ASPIRIN EC LOW STRENGTH) 81 MG EC tablet Take 81 mg by mouth every morning.     . Cholecalciferol (VITAMIN D) 2000 UNITS tablet Take 2,000 Units by mouth daily.     [name] docusate sodium (COLACE) 100 MG capsule Take 1 capsule by mouth every other day    . doxazosin (CARDURA) 4 MG tablet Take 1 tablet (4 mg total) by mouth 2 (two) times daily. TAKE 1 TABLET BY MOUTH IN THE MORNING AND 1 TABLET AT BEDTIME 60 tablet 5  . fluticasone (FLONASE) 50 MCG/ACT nasal spray Place 2 sprays into both nostrils daily as needed for allergies or rhinitis.     . furosemide (LASIX) 40 MG tablet Take 1 tablet (40 mg total) by mouth daily as needed for fluid or edema. 30 tablet 2  . isosorbide mononitrate (IMDUR) 30 MG 24 hr tablet Take 30 mg by mouth daily.    . metoprolol succinate (TOPROL-XL) 50 MG 24 hr tablet TAKE ONE TABLET BY MOUTH IN THE MORNING AND ONE-HALF IN THE EVENING 45 tablet 5  . Multiple Vitamin (MULTIVITAMIN WITH MINERALS) TABS Take 1 tablet by mouth daily.     . multivitamin-lutein (OCUVITE-LUTEIN) CAPS capsule Take 1 capsule by mouth daily.    [name] MYRBETRIQ 50 MG TB24 tablet Take 1 tablet by mouth daily.    . nitroGLYCERIN (NITROSTAT) 0.4 MG SL tablet Place 1 tablet (0.4 mg total) under the tongue every 5 (five) minutes as needed for chest pain (MAX 3 TABLETS). 10 tablet 2  . omeprazole  (PRILOSEC) 20 MG capsule Take 1 capsule (20 mg total) by mouth 2 (two) times daily. 60 capsule 5  . oxymetazoline (AFRIN) 0.05 % nasal spray Place 2 sprays into the nose every 12 (twelve) hours as needed for congestion (x3 days). Dryness    . potassium chloride SA (KLOR-CON M20) 20 MEQ tablet TAKE ONE TABLET BY MOUTH ONCE DAILY WHEN  YOU  TAKE LASIX 30 tablet 5  . Simethicone (GAS-X EXTRA STRENGTH) 125 MG CAPS Take by mouth as directed. Reported on 10/10/2015 one after each meal    . simvastatin (ZOCOR) 20 MG tablet Take 1 tablet (20 mg total) by mouth at bedtime. 90 tablet 0   No current facility-administered medications for this visit.      Past Medical History:  Diagnosis Date  . Allergic rhinitis   . Allergy   . Arthritis 06/04/2014   Trial of sulindac 05/30/14 since cri risk with other nsaids   . Barrett's esophagus 10/27/2011  . BPH (benign prostatic hyperplasia) 02/20/2014  . CAD (coronary artery disease) of artery bypass graft, Occluded VG-non dominant LCX medical therapy 11/25/2013  . CAD (coronary artery disease), native coronary artery - LIMA-LAD, SVG-Diag, SVG-RCA 2002    Catheterization 2002 showing occluded LAD, 99% diagonal, occluded distal circumflex, 90% nondominant right coronary artery  CABG 08/06/00 by  Dr. Nils Pyle with LIMA to LAD, SVG to diagonal, SVG to right coronary artery, circumflex was noted to be too small to graft    . CAP (community acquired pneumonia) 04/16/2016   See cxr 04/15/16 > treated with 7 days of Levaquin 500 mg daily with resolution radiographically and clinically.  . Cataract   . Cerumen impaction 06/05/2015  . CHF (congestive heart failure), NYHA class III ([place]) 02/20/2014  . Dyspnea    with exertion   . Dyspnea on exertion 11/08/2013   Followed as Primary Care Patient/ [facility]/ [name]  - new onset early June 2015  - 11/08/2013  Walked RA x 3 laps @ 185 ft each stopped due to  End of study, mild sob, no cp and no desat, EKG ok  - Cardiac w/u  inconclusive but not clearly having ischemia  11/24/13   - 09/11/2016  Walked RA x 3 laps @ 185 ft each stopped due to  End of study, nl pace, no sob or desat   - Spirometry 09/11/2016    . Essential hypertension    Referred to Int med [facility] 12/12/2016   . GERD (gastroesophageal reflux disease)   . H/O hiatal hernia   . History of skin cancer    bilateral arms with removal  . Hyperlipidemia   . Hyperlipidemia LDL goal <70    Followed as Primary Care Patient/ [facility]/ [name]     - Target LDL < 70 as has IHD    . Hypertension   . Hypertensive heart disease    Followed as Primary Care Patient/ [facility]/ [name]     . Ischemic heart disease   . Left femoral hernia s/p laparoscopic repair 05/28/2017 05/28/2017  . Lumbar disc disease   . Myocardial infarction ([place]) 2002  . Right inguinal hernia s/p laparoscopic repair 05/28/2017 05/28/2017  . Stage 3 chronic kidney disease ([place]) 05/22/2015   trial off lasix/ppi/clinoril / [place] and renal eval 05/22/2015 >>>    . T wave inversion in EKG 02/20/2014  . Unstable angina ([place]) 11/23/2013    ROS:   All systems reviewed and negative except as noted in the HPI.   Past Surgical History:  Procedure Laterality Date  . COLONOSCOPY    . CORONARY ARTERY BYPASS GRAFT  2002   LIMA-LAD, SVG-Diag, SVG-RCA  . EYE SURGERY Bilateral 1995, 2000   ioc for cataracts  . GROIN DISSECTION  01/23/2012   Procedure: Virl Son EXPLORATION;  Surgeon: Harl Bowie, MD;  Location: [facility];  Service: General;  Laterality: Left;  Left Inguinal Exploration, release of scar tissue, Placement of Mesh  . INGUINAL HERNIA REPAIR Left 1983  . INGUINAL HERNIA REPAIR Bilateral 05/28/2017   Procedure: LAPAROSCOPIC RIGHT AND LEFT INGUINAL HERNIA REPAIR WITH MESH;  Surgeon: Michael Boston, MD;  Location: [facility];  Service: General;  Laterality: Bilateral;  . INSERTION OF MESH Bilateral 05/28/2017   Procedure: INSERTION OF MESH;  Surgeon: Michael Boston, MD;   Location: [facility];  Service: General;  Laterality: Bilateral;  . LEFT HEART CATHETERIZATION WITH CORONARY ANGIOGRAM N/A 11/24/2013   Procedure: LEFT HEART CATHETERIZATION WITH CORONARY ANGIOGRAM;  Surgeon: Peter M Martinique, MD;  Location: [facility] CATH LAB;  Service: Cardiovascular;  Laterality: N/A;  . NOSE SURGERY  1985     Family History  Problem Relation Age of Onset  . Breast cancer Mother   . Diabetes Mother   . Hypertension Father   . Dementia Father 70  early 38s  . Diabetes Sister   . COPD Brother   . Colon cancer Neg Hx      Social History   Socioeconomic History  . Marital status: Married    Spouse name: [name]  . Number of children: 0  . Years of education: Not on file  . Highest education level: 11th grade  Occupational History  . Occupation: Retired    Fish farm manager: [employer]  . Financial resource strain: Not very hard  . Food insecurity:    Worry: Never true    Inability: Never true  . Transportation needs:    Medical: No    Non-medical: No  Tobacco Use  . Smoking status: Never Smoker  . Smokeless tobacco: Former Systems developer    Types: Chew  Substance and Sexual Activity  . Alcohol use: No    Alcohol/week: 0.0 standard drinks  . Drug use: No  . Sexual activity: Never  Lifestyle  . Physical activity:    Days per week: 0 days    Minutes per session: 0 min  . Stress: Not at all  Relationships  . Social connections:    Talks on phone: More than three times a week    Gets together: More than three times a week    Attends religious service: More than 4 times per year    Active member of club or organization: No    Attends meetings of clubs or organizations: Never    Relationship status: Married  . Intimate partner violence:    Fear of current or ex partner: No    Emotionally abused: No    Physically abused: No    Forced sexual activity: No  Other Topics Concern  . Not on file  Social History Narrative    Remarried in August 16, 2003 after first wife died of emphysema in 08/15/2001. 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 He lives in a one story home with his wife. They raising 2 great grandchildren. One is 80 months old and the other is 77 years old. He has no biological children.      BP (!) 124/58   Pulse 72   Ht 5' 10.5" (1.791 m)   Wt 176 lb (79.8 kg)   BMI 24.90 kg/m   Physical Exam:  Well appearing 77 yo man, NAD HEENT: Unremarkable Neck:  6 cm JVD, no thyromegally Lymphatics:  No adenopathy Back:  No CVA tenderness Lungs:  Clear with no wheezes HEART:  Regular rate rhythm, no murmurs, no rubs, no clicks Abd:  soft, positive bowel sounds, no organomegally, no rebound, no guarding Ext:  2 plus pulses, no edema, no cyanosis, no clubbing Skin:  No rashes no nodules Neuro:  CN II through XII intact, motor grossly intact  EKG - none  Assess/Plan: 1. CAD - he is s/p remote CABG and denies anginal symptoms. He has chronic dyspnea. 2. Dyspnea - he has class 2 symptoms.  3. HTN heart disease - his blood pressure is well controlled today. No change. He is encouraged to maintain a low sodium diet.  Isaac [name],M.D.     Isaac [name]

## 2017-12-11 NOTE — Patient Instructions (Signed)

## 2017-12-14 ENCOUNTER — Encounter: Payer: Self-pay | Admitting: Gastroenterology

## 2017-12-22 ENCOUNTER — Other Ambulatory Visit (INDEPENDENT_AMBULATORY_CARE_PROVIDER_SITE_OTHER): Payer: Medicare Other

## 2017-12-22 ENCOUNTER — Other Ambulatory Visit: Payer: Self-pay

## 2017-12-22 ENCOUNTER — Ambulatory Visit (AMBULATORY_SURGERY_CENTER): Payer: Medicare Other | Admitting: Gastroenterology

## 2017-12-22 ENCOUNTER — Encounter: Payer: Self-pay | Admitting: Gastroenterology

## 2017-12-22 VITALS — BP 115/71 | HR 59 | Temp 98.2°F | Resp 13 | Ht 70.5 in | Wt 176.0 lb

## 2017-12-22 DIAGNOSIS — D649 Anemia, unspecified: Secondary | ICD-10-CM

## 2017-12-22 DIAGNOSIS — K2270 Barrett's esophagus without dysplasia: Secondary | ICD-10-CM

## 2017-12-22 LAB — HEMOGLOBIN: Hemoglobin: 12.6 g/dL — ABNORMAL LOW (ref 13.0–17.0)

## 2017-12-22 MED ORDER — SODIUM CHLORIDE 0.9 % IV SOLN: 500.0000 mL | Freq: Once | INTRAVENOUS | Status: DC

## 2017-12-22 NOTE — Patient Instructions (Signed)
*   Handout given on [place] *  YOU HAD AN ENDOSCOPIC PROCEDURE TODAY AT [facility]:   Refer to the procedure report that was given to you for any specific questions about what was found during the examination.  If the procedure report does not answer your questions, please call your gastroenterologist to clarify.  If you requested that your care partner not be given the details of your procedure findings, then the procedure report has been included in a sealed envelope for you to review at your convenience later.  YOU SHOULD EXPECT: Some feelings of bloating in the abdomen. Passage of more gas than usual.  Walking can help get rid of the air that was put into your GI tract during the procedure and reduce the bloating. If you had a lower endoscopy (such as a colonoscopy or flexible sigmoidoscopy) you may notice spotting of blood in your stool or on the toilet paper. If you underwent a bowel prep for your procedure, you may not have a normal bowel movement for a few days.  Please Note:  You might notice some irritation and congestion in your nose or some drainage.  This is from the oxygen used during your procedure.  There is no need for concern and it should clear up in a day or so.  SYMPTOMS TO REPORT IMMEDIATELY:   Following upper endoscopy (EGD)  Vomiting of blood or coffee ground material  New chest pain or pain under the shoulder blades  Painful or persistently difficult swallowing  New shortness of breath  Fever of 100F or higher  Black, tarry-looking stools  For urgent or emergent issues, a gastroenterologist can be reached at any hour by calling [phone].   DIET:  We do recommend a small meal at first, but then you may proceed to your regular diet.  Drink plenty of fluids but you should avoid alcoholic beverages for 24 hours.  ACTIVITY:  You should plan to take it easy for the rest of today and you should NOT DRIVE or use heavy machinery until tomorrow (because  of the sedation medicines used during the test).    FOLLOW UP: Our staff will call the number listed on your records the next business day following your procedure to check on you and address any questions or concerns that you may have regarding the information given to you following your procedure. If we do not reach you, we will leave a message.  However, if you are feeling well and you are not experiencing any problems, there is no need to return our call.  We will assume that you have returned to your regular daily activities without incident.  If any biopsies were taken you will be contacted by phone or by letter within the next 1-3 weeks.  Please call us at [phone] if you have not heard about the biopsies in 3 weeks.    SIGNATURES/CONFIDENTIALITY: You and/or your care partner have signed paperwork which will be entered into your electronic medical record.  These signatures attest to the fact that that the information above on your After Visit Summary has been reviewed and is understood.  Full responsibility of the confidentiality of this discharge information lies with you and/or your care-partner.

## 2017-12-22 NOTE — Op Note (Signed)
[facility] Patient Name: Isaac [name] Procedure Date: 12/22/2017 9:48 AM MRN: [id number] Endoscopist: Remo Lipps P. 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 Medicines:                Monitored Anesthesia Care Procedure:                Pre-Anesthesia Assessment:                           - Prior to the procedure, a History and Physical                            was performed, and patient medications and                            allergies were reviewed. The patient's tolerance of                            previous anesthesia was also reviewed. The risks                            and benefits of the procedure and the sedation                            options and risks were discussed with the patient.                            All questions were answered, and informed consent                            was obtained. Prior Anticoagulants: The patient has                            taken no previous anticoagulant or antiplatelet                            agents. ASA Grade Assessment: III - A patient with                            severe systemic disease. After reviewing the risks                            and benefits, the patient was deemed in  satisfactory condition to undergo the procedure.                           After obtaining informed consent, the endoscope was                            passed under direct vision. Throughout the                            procedure, the patient's blood pressure, pulse, and                            oxygen saturations were monitored continuously. The                            Model GIF-[id number])  scope was introduced                            through the mouth, and advanced to the second part                            of duodenum. The upper GI endoscopy was                            accomplished without difficulty. The patient                            tolerated the procedure well. Scope In: Scope Out: Findings:                 Esophagogastric landmarks were identified: the                            Z-line was found at 29 cm, the gastroesophageal                            junction was found at 35 cm and the upper extent of                            the gastric folds was found at 40 cm from the                            incisors.                           A 5 cm hiatal hernia was present.                           There were esophageal mucosal changes classified as                            Barrett's stage C5-M6 per Prague criteria present                            in the lower  third of the esophagus. No nodularity                            was appreciated. The maximum longitudinal extent of                            these mucosal changes was 6 cm in length. Mucosa                            was biopsied with a cold forceps for histology                            every 2 cm. A total of 4 specimen bottles were sent                            to pathology.                           The exam of the esophagus was otherwise normal.                           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                           The exam of the stomach was otherwise normal.                           The duodenal bulb and second portion of the                            duodenum were normal. Complications:            No immediate complications. Estimated blood loss:                            Minimal. Estimated Blood Loss:     Estimated blood loss was  minimal. Impression:               - Esophagogastric landmarks identified.                           - 5 cm hiatal hernia.                           - Esophageal mucosal changes classified as                            Barrett's stage C5-M6 per Prague criteria. Biopsied.                           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                           - Normal duodenal bulb and second portion of the                            duodenum. Recommendation:           - Patient has a contact number available for                            emergencies. The signs and symptoms of potential                            delayed complications were discussed with the                            patient. Return to normal activities tomorrow.                            Written discharge instructions were provided to the                            patient.                           - Resume previous diet.                           - Continue present medications.                           - Await pathology results.                           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. Armbruster, MD 12/22/2017 10:15:52 AM This report has been signed electronically.

## 2017-12-22 NOTE — Progress Notes (Signed)
Called to room to assist during endoscopic procedure.  Patient ID and intended procedure confirmed with present staff. Received instructions for my participation in the procedure from the performing physician.  

## 2017-12-22 NOTE — Progress Notes (Signed)
Pt's states no medical or surgical changes since previsit or office visit. 

## 2017-12-22 NOTE — Progress Notes (Signed)
Report given to PACU, vss 

## 2017-12-23 ENCOUNTER — Telehealth: Payer: Self-pay

## 2017-12-23 ENCOUNTER — Telehealth: Payer: Self-pay | Admitting: *Deleted

## 2017-12-23 NOTE — Telephone Encounter (Signed)
No answer for post procedure call back. Left message and will attempt to call back later this afternoon. Sm 

## 2017-12-23 NOTE — Telephone Encounter (Signed)
  Follow up Call-  Call back number 12/22/2017  Post procedure Call Back phone  # [phone]  Permission to leave phone message Yes  Some recent data might be hidden     Patient questions:  Do you have a fever, pain , or abdominal swelling? No. Pain Score  0 *  Have you tolerated food without any problems? Yes.    Have you been able to return to your normal activities? Yes.    Do you have any questions about your discharge instructions: Diet   No. Medications  No. Follow up visit  No.  Do you have questions or concerns about your Care? No.  Actions: * If pain score is 4 or above: No action needed, pain <4.

## 2017-12-30 ENCOUNTER — Encounter: Payer: Self-pay | Admitting: Gastroenterology

## 2018-01-21 ENCOUNTER — Other Ambulatory Visit: Payer: Self-pay | Admitting: Family Medicine

## 2018-02-11 ENCOUNTER — Encounter: Payer: Self-pay | Admitting: Family Medicine

## 2018-02-11 ENCOUNTER — Ambulatory Visit: Payer: Medicare Other | Admitting: Family Medicine

## 2018-02-11 VITALS — BP 102/59 | HR 72 | Temp 95.8°F | Ht 70.5 in | Wt 180.0 lb

## 2018-02-11 DIAGNOSIS — I10 Essential (primary) hypertension: Secondary | ICD-10-CM

## 2018-02-11 DIAGNOSIS — Z23 Encounter for immunization: Secondary | ICD-10-CM | POA: Diagnosis not present

## 2018-02-11 DIAGNOSIS — K219 Gastro-esophageal reflux disease without esophagitis: Secondary | ICD-10-CM | POA: Diagnosis not present

## 2018-02-11 DIAGNOSIS — N400 Enlarged prostate without lower urinary tract symptoms: Secondary | ICD-10-CM

## 2018-02-11 DIAGNOSIS — E785 Hyperlipidemia, unspecified: Secondary | ICD-10-CM | POA: Diagnosis not present

## 2018-02-11 DIAGNOSIS — R7989 Other specified abnormal findings of blood chemistry: Secondary | ICD-10-CM

## 2018-02-11 LAB — CBC WITH DIFFERENTIAL/PLATELET
Basophils Absolute: 0.1 10*3/uL (ref 0.0–0.2)
Basos: 1 %
EOS (ABSOLUTE): 0.3 10*3/uL (ref 0.0–0.4)
EOS: 3 %
HEMATOCRIT: 40 % (ref 37.5–51.0)
HEMOGLOBIN: 13.2 g/dL (ref 13.0–17.7)
IMMATURE GRANS (ABS): 0 10*3/uL (ref 0.0–0.1)
IMMATURE GRANULOCYTES: 0 %
LYMPHS ABS: 1.5 10*3/uL (ref 0.7–3.1)
LYMPHS: 20 %
MCH: 31.8 pg (ref 26.6–33.0)
MCHC: 33 g/dL (ref 31.5–35.7)
MCV: 96 fL (ref 79–97)
MONOCYTES: 11 %
Monocytes Absolute: 0.8 10*3/uL (ref 0.1–0.9)
Neutrophils Absolute: 4.9 10*3/uL (ref 1.4–7.0)
Neutrophils: 65 %
Platelets: 151 10*3/uL (ref 150–450)
RBC: 4.15 x10E6/uL (ref 4.14–5.80)
RDW: 12.6 % (ref 12.3–15.4)
WBC: 7.6 10*3/uL (ref 3.4–10.8)

## 2018-02-11 LAB — CMP14+EGFR
ALT: 13 IU/L (ref 0–44)
AST: 15 IU/L (ref 0–40)
Albumin/Globulin Ratio: 1.6 (ref 1.2–2.2)
Albumin: 4.2 g/dL (ref 3.5–4.8)
Alkaline Phosphatase: 87 IU/L (ref 39–117)
BUN / CREAT RATIO: 15 (ref 10–24)
BUN: 29 mg/dL — AB (ref 8–27)
Bilirubin Total: 0.3 mg/dL (ref 0.0–1.2)
CALCIUM: 8.9 mg/dL (ref 8.6–10.2)
CO2: 24 mmol/L (ref 20–29)
CREATININE: 1.98 mg/dL — AB (ref 0.76–1.27)
Chloride: 102 mmol/L (ref 96–106)
GFR calc Af Amer: 37 mL/min/{1.73_m2} — ABNORMAL LOW (ref >59)
GFR, EST NON AFRICAN AMERICAN: 32 mL/min/{1.73_m2} — AB (ref >59)
GLOBULIN, TOTAL: 2.6 g/dL (ref 1.5–4.5)
GLUCOSE: 94 mg/dL (ref 65–99)
Potassium: 4.5 mmol/L (ref 3.5–5.2)
SODIUM: 139 mmol/L (ref 134–144)
TOTAL PROTEIN: 6.8 g/dL (ref 6.0–8.5)

## 2018-02-11 LAB — LIPID PANEL
CHOL/HDL RATIO: 3.7 ratio (ref 0.0–5.0)
Cholesterol, Total: 172 mg/dL (ref 100–199)
HDL: 46 mg/dL (ref >39)
LDL CALC: 73 mg/dL (ref 0–99)
Triglycerides: 265 mg/dL — ABNORMAL HIGH (ref 0–149)
VLDL CHOLESTEROL CAL: 53 mg/dL — AB (ref 5–40)

## 2018-02-11 MED ORDER — DOXAZOSIN MESYLATE 2 MG PO TABS: 2.0000 mg | ORAL_TABLET | Freq: Two times a day (BID) | ORAL | 5 refills | Status: DC

## 2018-02-11 MED ORDER — NITROGLYCERIN 0.4 MG SL SUBL: 0.4000 mg | SUBLINGUAL_TABLET | SUBLINGUAL | 6 refills | Status: DC | PRN

## 2018-02-11 MED ORDER — METOPROLOL SUCCINATE ER 50 MG PO TB24: ORAL_TABLET | ORAL | 5 refills | Status: DC

## 2018-02-11 MED ORDER — SIMVASTATIN 20 MG PO TABS: 20.0000 mg | ORAL_TABLET | Freq: Every day | ORAL | 3 refills | Status: DC

## 2018-02-11 MED ORDER — FUROSEMIDE 40 MG PO TABS: 40.0000 mg | ORAL_TABLET | Freq: Every day | ORAL | 5 refills | Status: DC | PRN

## 2018-02-11 MED ORDER — OMEPRAZOLE 20 MG PO CPDR: 20.0000 mg | DELAYED_RELEASE_CAPSULE | Freq: Two times a day (BID) | ORAL | 5 refills | Status: DC

## 2018-02-11 NOTE — Progress Notes (Signed)
BP (!) 102/59   Pulse 72   Temp (!) 95.8 F (35.4 C) (Oral)   Ht 5' 10.5" (1.791 m)   Wt 180 lb (81.6 kg)   BMI 25.46 kg/m    Subjective:    Patient ID: Isaac [name], male    DOB: 1941/02/21, 77 y.o.   MRN: [id number]  HPI: Isaac [name] is a 77 y.o. male presenting on 02/11/2018 for Hyperlipidemia (6 month follow up- Patient states for a few months he has been having bilateral hand stifness at night.) and Hypertension   HPI Hyperlipidemia Patient is coming in for recheck of his hyperlipidemia. The patient is currently taking simvastatin. They deny any issues with myalgias or history of liver damage from it. They deny any focal numbness or weakness or chest pain.   Hypertension Patient is currently on Imdur and metoprolol and doxazosin, and their blood pressure today is 102/59.  Patient does have the occasional lightheadedness when he stands up quickly and then he will does have to sit down, he has not had any falls to this point but with his blood pressure low it is concerning that he could end up having falls.. Patient denies headaches, blurred vision, chest pains, shortness of breath, or weakness. Denies any side effects from medication and is content with current medication.   GERD Patient is currently on omeprazole.  She denies any major symptoms or abdominal pain or belching or burping. She denies any blood in her stool or lightheadedness or dizziness.   BPH Patient denies any urinary symptoms currently.  He is currently taking doxazosin for this and because his blood pressure is low we have considered cutting it back.  He is agreeable towards this and we will just monitor the prostate symptoms and see if it gets worse.  But he denies any issues currently.  Relevant past medical, surgical, family and social history reviewed and updated as indicated. Interim medical history since our last visit reviewed. Allergies and medications reviewed and updated.  Review of Systems    Constitutional: Negative for chills and fever.  Eyes: Negative for visual disturbance.  Respiratory: Negative for shortness of breath and wheezing.   Cardiovascular: Negative for chest pain and leg swelling.  Gastrointestinal: Negative for abdominal distention and abdominal pain.  Musculoskeletal: Negative for back pain and gait problem.  Skin: Negative for rash.  Neurological: Positive for dizziness and light-headedness. Negative for weakness, numbness and headaches.  All other systems reviewed and are negative.   Per HPI unless specifically indicated above   Allergies as of 02/11/2018   No Known Allergies     Medication List        Accurate as of 02/11/18  8:20 AM. Always use your most recent med list.          ADULT ASPIRIN EC LOW STRENGTH 81 MG EC tablet Generic drug:  aspirin Take 81 mg by mouth every morning.   docusate sodium 100 MG capsule Commonly known as:  COLACE Take 1 capsule by mouth every other day   doxazosin 2 MG tablet Commonly known as:  CARDURA Take 1 tablet (2 mg total) by mouth 2 (two) times daily. TAKE 1 TABLET BY MOUTH IN THE MORNING AND 1 TABLET AT BEDTIME   fluticasone 50 MCG/ACT nasal spray Commonly known as:  FLONASE Place 2 sprays into both nostrils daily as needed for allergies or rhinitis.   furosemide 40 MG tablet Commonly known as:  LASIX Take 1 tablet (40 mg total) by  mouth daily as needed for fluid or edema.   GAS-X EXTRA STRENGTH 125 MG Caps Generic drug:  Simethicone Take by mouth as directed. Reported on 10/10/2015 one after each meal   isosorbide mononitrate 30 MG 24 hr tablet Commonly known as:  IMDUR Take 30 mg by mouth daily.   metoprolol succinate 50 MG 24 hr tablet Commonly known as:  TOPROL-XL TAKE 1 TABLET BY MOUTH IN THE MORNING AND 1/2 (ONE-HALF) IN THE EVENING   multivitamin with minerals Tabs tablet Take 1 tablet by mouth daily.   multivitamin-lutein Caps capsule Take 1 capsule by mouth daily.    MYRBETRIQ 50 MG Tb24 tablet Generic drug:  mirabegron ER Take 1 tablet by mouth daily.   nitroGLYCERIN 0.4 MG SL tablet Commonly known as:  NITROSTAT Place 1 tablet (0.4 mg total) under the tongue every 5 (five) minutes as needed for chest pain (MAX 3 TABLETS).   omeprazole 20 MG capsule Commonly known as:  PRILOSEC Take 1 capsule (20 mg total) by mouth 2 (two) times daily.   oxymetazoline 0.05 % nasal spray Commonly known as:  AFRIN Place 2 sprays into the nose every 12 (twelve) hours as needed for congestion (x3 days). Dryness   potassium chloride SA 20 MEQ tablet Commonly known as:  K-DUR,KLOR-CON TAKE ONE TABLET BY MOUTH ONCE DAILY WHEN  YOU  TAKE LASIX   simvastatin 20 MG tablet Commonly known as:  ZOCOR Take 1 tablet (20 mg total) by mouth at bedtime.   Vitamin D 2000 units tablet Take 2,000 Units by mouth daily.          Objective:    BP (!) 102/59   Pulse 72   Temp (!) 95.8 F (35.4 C) (Oral)   Ht 5' 10.5" (1.791 m)   Wt 180 lb (81.6 kg)   BMI 25.46 kg/m   Wt Readings from Last 3 Encounters:  02/11/18 180 lb (81.6 kg)  12/22/17 176 lb (79.8 kg)  12/11/17 176 lb 6.4 oz (80 kg)    Physical Exam  Constitutional: He is oriented to person, place, and time. He appears well-developed and well-nourished. No distress.  Eyes: Conjunctivae are normal. No scleral icterus.  Neck: Neck supple. No thyromegaly present.  Cardiovascular: Normal rate, regular rhythm, normal heart sounds and intact distal pulses.  No murmur heard. Pulmonary/Chest: Effort normal and breath sounds normal. No respiratory distress. He has no wheezes.  Musculoskeletal: Normal range of motion. He exhibits no edema.  Lymphadenopathy:    He has no cervical adenopathy.  Neurological: He is alert and oriented to person, place, and time. Coordination normal.  Skin: Skin is warm and dry. No rash noted. He is not diaphoretic.  Psychiatric: He has a normal mood and affect. His behavior is normal.   Nursing note and vitals reviewed.       Assessment & Plan:   Problem List Items Addressed This Visit      Cardiovascular and Mediastinum   Essential hypertension   Relevant Medications   doxazosin (CARDURA) 2 MG tablet   furosemide (LASIX) 40 MG tablet   nitroGLYCERIN (NITROSTAT) 0.4 MG SL tablet   simvastatin (ZOCOR) 20 MG tablet   metoprolol succinate (TOPROL-XL) 50 MG 24 hr tablet   Other Relevant Orders   CMP14+EGFR     Digestive   GERD (gastroesophageal reflux disease) (Chronic)   Relevant Medications   omeprazole (PRILOSEC) 20 MG capsule   Other Relevant Orders   CBC with Differential/Platelet     Genitourinary  BPH (benign prostatic hyperplasia)   Relevant Orders   CMP14+EGFR     Other   Hyperlipidemia LDL goal <70 - Primary (Chronic)   Relevant Medications   doxazosin (CARDURA) 2 MG tablet   furosemide (LASIX) 40 MG tablet   nitroGLYCERIN (NITROSTAT) 0.4 MG SL tablet   simvastatin (ZOCOR) 20 MG tablet   metoprolol succinate (TOPROL-XL) 50 MG 24 hr tablet   Other Relevant Orders   Lipid panel       Follow up plan: Return in about 6 months (around 08/13/2018), or if symptoms worsen or fail to improve, for Recheck hypertension and GERD and BPH.  Counseling provided for all of the vaccine components Orders Placed This Encounter  Procedures  . CBC with Differential/Platelet  . CMP14+EGFR  . Lipid panel    Caryl Pina, MD [facility] 02/11/2018, 8:20 AM

## 2018-02-15 ENCOUNTER — Telehealth: Payer: Self-pay | Admitting: Family Medicine

## 2018-02-15 NOTE — Telephone Encounter (Signed)
Aware of labs 

## 2018-02-15 NOTE — Addendum Note (Signed)
Addended by: Nigel Berthold C on: 02/15/2018 12:23 PM   Modules accepted: Orders

## 2018-03-29 DIAGNOSIS — N183 Chronic kidney disease, stage 3 (moderate): Secondary | ICD-10-CM | POA: Diagnosis not present

## 2018-03-29 DIAGNOSIS — N400 Enlarged prostate without lower urinary tract symptoms: Secondary | ICD-10-CM | POA: Diagnosis not present

## 2018-03-29 DIAGNOSIS — R809 Proteinuria, unspecified: Secondary | ICD-10-CM | POA: Diagnosis not present

## 2018-03-29 DIAGNOSIS — I10 Essential (primary) hypertension: Secondary | ICD-10-CM | POA: Diagnosis not present

## 2018-04-07 ENCOUNTER — Other Ambulatory Visit: Payer: Medicare Other

## 2018-04-12 ENCOUNTER — Other Ambulatory Visit (HOSPITAL_COMMUNITY): Payer: Self-pay | Admitting: Medical

## 2018-04-12 DIAGNOSIS — N183 Chronic kidney disease, stage 3 unspecified: Secondary | ICD-10-CM

## 2018-05-04 ENCOUNTER — Ambulatory Visit (HOSPITAL_COMMUNITY)
Admission: RE | Admit: 2018-05-04 | Discharge: 2018-05-04 | Disposition: A | Discharge status: Home / Self Care | Payer: Medicare Other | Source: Ambulatory Visit | Attending: Medical | Admitting: Medical

## 2018-05-04 DIAGNOSIS — N183 Chronic kidney disease, stage 3 unspecified: Secondary | ICD-10-CM

## 2018-07-21 ENCOUNTER — Ambulatory Visit (INDEPENDENT_AMBULATORY_CARE_PROVIDER_SITE_OTHER): Payer: Medicare Other | Admitting: Family Medicine

## 2018-07-21 ENCOUNTER — Other Ambulatory Visit: Payer: Self-pay

## 2018-07-21 DIAGNOSIS — J209 Acute bronchitis, unspecified: Secondary | ICD-10-CM

## 2018-07-21 DIAGNOSIS — R609 Edema, unspecified: Secondary | ICD-10-CM

## 2018-07-21 DIAGNOSIS — J40 Bronchitis, not specified as acute or chronic: Secondary | ICD-10-CM

## 2018-07-21 MED ORDER — BENZONATATE 100 MG PO CAPS: 100.0000 mg | ORAL_CAPSULE | Freq: Three times a day (TID) | ORAL | 0 refills | Status: DC | PRN

## 2018-07-21 MED ORDER — AMOXICILLIN-POT CLAVULANATE 500-125 MG PO TABS: 1.0000 | ORAL_TABLET | Freq: Two times a day (BID) | ORAL | 0 refills | Status: AC

## 2018-07-21 NOTE — Patient Instructions (Signed)
We discussed that you should be evaluated by your Cardiologist and Kidney specialist if the swelling is not getting better.  I have sent in an antibiotic for you to take 2 times daily for treatment of sinus infection/ bronchitis.  We discussed reasons for emergent evaluation.   Sinusitis, Adult Sinusitis is soreness and swelling (inflammation) of your sinuses. Sinuses are hollow spaces in the bones around your face. They are located:  Around your eyes.  In the middle of your forehead.  Behind your nose.  In your cheekbones. Your sinuses and nasal passages are lined with a fluid called mucus. Mucus drains out of your sinuses. Swelling can trap mucus in your sinuses. This lets germs (bacteria, virus, or fungus) grow, which leads to infection. Most of the time, this condition is caused by a virus. What are the causes? This condition is caused by:  Allergies.  Asthma.  Germs.  Things that block your nose or sinuses.  Growths in the nose (nasal polyps).  Chemicals or irritants in the air.  Fungus (rare). What increases the risk? You are more likely to develop this condition if:  You have a weak body defense system (immune system).  You do a lot of swimming or diving.  You use nasal sprays too much.  You smoke. What are the signs or symptoms? The main symptoms of this condition are pain and a feeling of pressure around the sinuses. Other symptoms include:  Stuffy nose (congestion).  Runny nose (drainage).  Swelling and warmth in the sinuses.  Headache.  Toothache.  A cough that may get worse at night.  Mucus that collects in the throat or the back of the nose (postnasal drip).  Being unable to smell and taste.  Being very tired (fatigue).  A fever.  Sore throat.  Bad breath. How is this diagnosed? This condition is diagnosed based on:  Your symptoms.  Your medical history.  A physical exam.  Tests to find out if your condition is short-term (acute)  or long-term (chronic). Your doctor may: ? Check your nose for growths (polyps). ? Check your sinuses using a tool that has a light (endoscope). ? Check for allergies or germs. ? Do imaging tests, such as an MRI or CT scan. How is this treated? Treatment for this condition depends on the cause and whether it is short-term or long-term.  If caused by a virus, your symptoms should go away on their own within 10 days. You may be given medicines to relieve symptoms. They include: ? Medicines that shrink swollen tissue in the nose. ? Medicines that treat allergies (antihistamines). ? A spray that treats swelling of the nostrils. ? Rinses that help get rid of thick mucus in your nose (nasal saline washes).  If caused by bacteria, your doctor may wait to see if you will get better without treatment. You may be given antibiotic medicine if you have: ? A very bad infection. ? A weak body defense system.  If caused by growths in the nose, you may need to have surgery. Follow these instructions at home: Medicines  Take, use, or apply over-the-counter and prescription medicines only as told by your doctor. These may include nasal sprays.  If you were prescribed an antibiotic medicine, take it as told by your doctor. Do not stop taking the antibiotic even if you start to feel better. Hydrate and humidify   Drink enough water to keep your pee (urine) pale yellow.  Use a cool mist humidifier to keep  the humidity level in your home above 50%.  Breathe in steam for 10-15 minutes, 3-4 times a day, or as told by your doctor. You can do this in the bathroom while a hot shower is running.  Try not to spend time in cool or dry air. Rest  Rest as much as you can.  Sleep with your head raised (elevated).  Make sure you get enough sleep each night. General instructions   Put a warm, moist washcloth on your face 3-4 times a day, or as often as told by your doctor. This will help with discomfort.   Wash your hands often with soap and water. If there is no soap and water, use hand sanitizer.  Do not smoke. Avoid being around people who are smoking (secondhand smoke).  Keep all follow-up visits as told by your doctor. This is important. Contact a doctor if:  You have a fever.  Your symptoms get worse.  Your symptoms do not get better within 10 days. Get help right away if:  You have a very bad headache.  You cannot stop throwing up (vomiting).  You have very bad pain or swelling around your face or eyes.  You have trouble seeing.  You feel confused.  Your neck is stiff.  You have trouble breathing. Summary  Sinusitis is swelling of your sinuses. Sinuses are hollow spaces in the bones around your face.  This condition is caused by tissues in your nose that become inflamed or swollen. This traps germs. These can lead to infection.  If you were prescribed an antibiotic medicine, take it as told by your doctor. Do not stop taking it even if you start to feel better.  Keep all follow-up visits as told by your doctor. This is important. This information is not intended to replace advice given to you by your health care provider. Make sure you discuss any questions you have with your health care provider. Document Released: 10/01/2007 Document Revised: 09/14/2017 Document Reviewed: 09/14/2017 Elsevier Interactive Patient Education  2019 [employer].

## 2018-07-21 NOTE — Progress Notes (Addendum)
Telephone visit  Subjective: CC: cough PCP: Dettinger, Fransisca Kaufmann, MD [id number] Isaac [name] is a 78 y.o. male calls for telephone consult today. Patient provides verbal consent for consult held via phone.  Location of patient: home Location of provider: [facility] Others present for call: stepdaughter Olegario Shearer conducting phone call)  1. Cough Patient has had a cough with associated shortness of breath for about 2 months now.  His daughter notes that over the last 2 weeks, the cough has become progressively worse.  It is productive with green sputum.  No hemoptysis.  No wheezing.  He has had dyspnea on exertion at baseline but again this seems slightly worse.  She notes associated feet and hand swelling.  He has had sinusitis but no fevers, chills, nausea, vomiting.  No purulence from nares.  No therapies tried thus far.  His medical history is significant for CKD 3 and he is followed by nephrology for this.  He has known coronary artery disease.   ROS: Per HPI  No Known Allergies Past Medical History:  Diagnosis Date  . Allergic rhinitis   . Allergy   . Arthritis 06/04/2014   Trial of sulindac 05/30/14 since cri risk with other nsaids   . Barrett's esophagus 10/27/2011  . BPH (benign prostatic hyperplasia) 02/20/2014  . CAD (coronary artery disease) of artery bypass graft, Occluded VG-non dominant LCX medical therapy 11/25/2013  . CAD (coronary artery disease), native coronary artery - LIMA-LAD, SVG-Diag, SVG-RCA 2002    Catheterization 2002 showing occluded LAD, 99% diagonal, occluded distal circumflex, 90% nondominant right coronary artery  CABG 08/06/00 by Dr. Nils Pyle with LIMA to LAD, SVG to diagonal, SVG to right coronary artery, circumflex was noted to be too small to graft    . CAP (community acquired pneumonia) 04/16/2016   See cxr 04/15/16 > treated with 7 days of Levaquin 500 mg daily with resolution radiographically and clinically.  . Cataract   . Cerumen impaction 06/05/2015  . CHF  (congestive heart failure), NYHA class III ([place]) 02/20/2014  . Dyspnea    with exertion   . Dyspnea on exertion 11/08/2013   Followed as Primary Care Patient/ [facility]/ [name]  - new onset early June 2015  - 11/08/2013  Walked RA x 3 laps @ 185 ft each stopped due to  End of study, mild sob, no cp and no desat, EKG ok  - Cardiac w/u inconclusive but not clearly having ischemia  11/24/13   - 09/11/2016  Walked RA x 3 laps @ 185 ft each stopped due to  End of study, nl pace, no sob or desat   - Spirometry 09/11/2016    . Essential hypertension    Referred to Int med [facility] 12/12/2016   . GERD (gastroesophageal reflux disease)   . H/O hiatal hernia   . History of skin cancer    bilateral arms with removal  . Hyperlipidemia   . Hyperlipidemia LDL goal <70    Followed as Primary Care Patient/ [facility]/ [name]     - Target LDL < 70 as has IHD    . Hypertension   . Hypertensive heart disease    Followed as Primary Care Patient/ [facility]/ [name]     . Ischemic heart disease   . Left femoral hernia s/p laparoscopic repair 05/28/2017 05/28/2017  . Lumbar disc disease   . Myocardial infarction ([place]) 2002  . Right inguinal hernia s/p laparoscopic repair 05/28/2017 05/28/2017  . Stage 3 chronic kidney disease ([place]) 05/22/2015  trial off lasix/ppi/clinoril / [place] and renal eval 05/22/2015 >>>    . T wave inversion in EKG 02/20/2014  . Unstable angina ([place]) 11/23/2013    Current Outpatient Medications:  .  aspirin (ADULT ASPIRIN EC LOW STRENGTH) 81 MG EC tablet, Take 81 mg by mouth every morning. , Disp: , Rfl:  .  Cholecalciferol (VITAMIN D) 2000 UNITS tablet, Take 2,000 Units by mouth daily. , Disp: , Rfl:  .  docusate sodium (COLACE) 100 MG capsule, Take 1 capsule by mouth every other day, Disp: , Rfl:  .  doxazosin (CARDURA) 2 MG tablet, Take 1 tablet (2 mg total) by mouth 2 (two) times daily. TAKE 1 TABLET BY MOUTH IN THE MORNING AND 1 TABLET AT BEDTIME, Disp: 60 tablet, Rfl: 5 .   fluticasone (FLONASE) 50 MCG/ACT nasal spray, Place 2 sprays into both nostrils daily as needed for allergies or rhinitis. , Disp: , Rfl:  .  furosemide (LASIX) 40 MG tablet, Take 1 tablet (40 mg total) by mouth daily as needed for fluid or edema., Disp: 30 tablet, Rfl: 5 .  isosorbide mononitrate (IMDUR) 30 MG 24 hr tablet, Take 30 mg by mouth daily., Disp: , Rfl:  .  metoprolol succinate (TOPROL-XL) 50 MG 24 hr tablet, TAKE 1 TABLET BY MOUTH IN THE MORNING AND 1/2 (ONE-HALF) IN THE EVENING, Disp: 45 tablet, Rfl: 5 .  Multiple Vitamin (MULTIVITAMIN WITH MINERALS) TABS, Take 1 tablet by mouth daily. , Disp: , Rfl:  .  multivitamin-lutein (OCUVITE-LUTEIN) CAPS capsule, Take 1 capsule by mouth daily., Disp: , Rfl:  .  MYRBETRIQ 50 MG TB24 tablet, Take 1 tablet by mouth daily., Disp: , Rfl:  .  nitroGLYCERIN (NITROSTAT) 0.4 MG SL tablet, Place 1 tablet (0.4 mg total) under the tongue every 5 (five) minutes as needed for chest pain (MAX 3 TABLETS)., Disp: 10 tablet, Rfl: 6 .  omeprazole (PRILOSEC) 20 MG capsule, Take 1 capsule (20 mg total) by mouth 2 (two) times daily., Disp: 60 capsule, Rfl: 5 .  oxymetazoline (AFRIN) 0.05 % nasal spray, Place 2 sprays into the nose every 12 (twelve) hours as needed for congestion (x3 days). Dryness, Disp: , Rfl:  .  potassium chloride SA (KLOR-CON M20) 20 MEQ tablet, TAKE ONE TABLET BY MOUTH ONCE DAILY WHEN  YOU  TAKE LASIX, Disp: 30 tablet, Rfl: 5 .  Simethicone (GAS-X EXTRA STRENGTH) 125 MG CAPS, Take by mouth as directed. Reported on 10/10/2015 one after each meal, Disp: , Rfl:  .  simvastatin (ZOCOR) 20 MG tablet, Take 1 tablet (20 mg total) by mouth at bedtime., Disp: 90 tablet, Rfl: 3  Assessment/ Plan: 78 y.o. male   1. Bronchitis I have sent an empiric treatment for presumed sinusitis versus bronchitis.  Given duration of symptoms, I have sent in Augmentin renally dosed 500 mg p.o. twice daily for the next week.  Tessalon Perles also sent for cough.  We did  discuss that symptoms of shortness of breath may also be related to cardiovascular disease and impaired renal function.  I have recommended that if symptoms are getting any worse or not improving that he seek evaluation by nephrology and/or cardiology.  We discussed reasons for emergent evaluation the emergency department.  She voiced good understanding. - amoxicillin-clavulanate (AUGMENTIN) 500-125 MG tablet; Take 1 tablet (500 mg total) by mouth 2 (two) times daily for 14 days.  Dispense: 28 tablet; Refill: 0  2. Edema, unspecified type As above  Start time: 3:40pm End time: 3:45pm  Meds ordered this encounter  Medications  . amoxicillin-clavulanate (AUGMENTIN) 500-125 MG tablet    Sig: Take 1 tablet (500 mg total) by mouth 2 (two) times daily for 14 days.    Dispense:  28 tablet    Refill:  0    Renally dosed  . benzonatate (TESSALON PERLES) 100 MG capsule    Sig: Take 1 capsule (100 mg total) by mouth 3 (three) times daily as needed.    Dispense:  20 capsule    Refill:  Lluveras, DO [place] [phone]

## 2018-07-28 ENCOUNTER — Other Ambulatory Visit: Payer: Self-pay

## 2018-07-28 ENCOUNTER — Encounter (HOSPITAL_COMMUNITY): Payer: Self-pay

## 2018-07-28 ENCOUNTER — Telehealth: Payer: Self-pay | Admitting: Family Medicine

## 2018-07-28 ENCOUNTER — Emergency Department (HOSPITAL_COMMUNITY)
Admission: EM | Admit: 2018-07-28 | Discharge: 2018-07-28 | Disposition: A | Discharge status: Home / Self Care | Payer: Medicare Other | Attending: Emergency Medicine | Admitting: Emergency Medicine

## 2018-07-28 DIAGNOSIS — I2510 Atherosclerotic heart disease of native coronary artery without angina pectoris: Secondary | ICD-10-CM | POA: Diagnosis not present

## 2018-07-28 DIAGNOSIS — Z87891 Personal history of nicotine dependence: Secondary | ICD-10-CM | POA: Insufficient documentation

## 2018-07-28 DIAGNOSIS — Z79899 Other long term (current) drug therapy: Secondary | ICD-10-CM | POA: Insufficient documentation

## 2018-07-28 DIAGNOSIS — N183 Chronic kidney disease, stage 3 (moderate): Secondary | ICD-10-CM | POA: Diagnosis not present

## 2018-07-28 DIAGNOSIS — I130 Hypertensive heart and chronic kidney disease with heart failure and stage 1 through stage 4 chronic kidney disease, or unspecified chronic kidney disease: Secondary | ICD-10-CM | POA: Diagnosis not present

## 2018-07-28 DIAGNOSIS — R42 Dizziness and giddiness: Secondary | ICD-10-CM | POA: Diagnosis not present

## 2018-07-28 DIAGNOSIS — R197 Diarrhea, unspecified: Secondary | ICD-10-CM | POA: Diagnosis not present

## 2018-07-28 DIAGNOSIS — I509 Heart failure, unspecified: Secondary | ICD-10-CM | POA: Insufficient documentation

## 2018-07-28 LAB — URINALYSIS, ROUTINE W REFLEX MICROSCOPIC
Bilirubin Urine: NEGATIVE
Glucose, UA: NEGATIVE mg/dL
Hgb urine dipstick: NEGATIVE
Ketones, ur: NEGATIVE mg/dL
Leukocytes,Ua: NEGATIVE
Nitrite: NEGATIVE
Protein, ur: NEGATIVE mg/dL
Specific Gravity, Urine: 1.014 (ref 1.005–1.030)
pH: 7 (ref 5.0–8.0)

## 2018-07-28 LAB — CBC
HCT: 44.1 % (ref 39.0–52.0)
Hemoglobin: 14.3 g/dL (ref 13.0–17.0)
MCH: 31.6 pg (ref 26.0–34.0)
MCHC: 32.4 g/dL (ref 30.0–36.0)
MCV: 97.4 fL (ref 80.0–100.0)
Platelets: 161 10*3/uL (ref 150–400)
RBC: 4.53 MIL/uL (ref 4.22–5.81)
RDW: 13.1 % (ref 11.5–15.5)
WBC: 7.9 10*3/uL (ref 4.0–10.5)
nRBC: 0 % (ref 0.0–0.2)

## 2018-07-28 LAB — BASIC METABOLIC PANEL
Anion gap: 10 (ref 5–15)
BUN: 24 mg/dL — ABNORMAL HIGH (ref 8–23)
CO2: 24 mmol/L (ref 22–32)
Calcium: 9.1 mg/dL (ref 8.9–10.3)
Chloride: 103 mmol/L (ref 98–111)
Creatinine, Ser: 1.68 mg/dL — ABNORMAL HIGH (ref 0.61–1.24)
GFR calc Af Amer: 45 mL/min — ABNORMAL LOW (ref >60)
GFR calc non Af Amer: 39 mL/min — ABNORMAL LOW (ref >60)
Glucose, Bld: 100 mg/dL — ABNORMAL HIGH (ref 70–99)
Potassium: 4.8 mmol/L (ref 3.5–5.1)
Sodium: 137 mmol/L (ref 135–145)

## 2018-07-28 LAB — CBG MONITORING, ED: Glucose-Capillary: 83 mg/dL (ref 70–99)

## 2018-07-28 MED ORDER — SODIUM CHLORIDE 0.9% FLUSH: 3.0000 mL | Freq: Once | INTRAVENOUS | Status: DC

## 2018-07-28 NOTE — Telephone Encounter (Signed)
Attempted to contact patient - NVM 

## 2018-07-28 NOTE — ED Triage Notes (Signed)
Pt brought to ED via [place] for near syncope episode this am. Pt states he was standing in the kitchen and felt like he was going to pass out. Per EMS, orthostatic +. Pt reports having diarrhea for approximately 1 week.

## 2018-07-29 NOTE — Telephone Encounter (Signed)
Pt has been discharged to home. Encounter closed

## 2018-07-30 NOTE — ED Provider Notes (Signed)
[facility] EMERGENCY DEPARTMENT Provider Note   CSN: [id number] Arrival date & time: 07/28/18  1136    History   Chief Complaint Chief Complaint  Patient presents with  . Dizziness    HPI Isaac [name] is a 78 y.o. male.     HPI   78 year old male with near syncope.  Patient states he was in his kitchen when he felt like he may pass out.  He did not actually lose consciousness.  Denies any pain or dyspnea.  No diaphoresis or nausea.  He states that he has had these symptoms intermittently for years, usually with changes in position.  Last to about a couple minutes and then resolve.  Denies any recent changes medications.  He is otherwise felt okay recently aside from having some diarrhea in the past week.  Past Medical History:  Diagnosis Date  . Allergic rhinitis   . Allergy   . Arthritis 06/04/2014   Trial of sulindac 05/30/14 since cri risk with other nsaids   . Barrett's esophagus 10/27/2011  . BPH (benign prostatic hyperplasia) 02/20/2014  . CAD (coronary artery disease) of artery bypass graft, Occluded VG-non dominant LCX medical therapy 11/25/2013  . CAD (coronary artery disease), native coronary artery - LIMA-LAD, SVG-Diag, SVG-RCA 2002    Catheterization 2002 showing occluded LAD, 99% diagonal, occluded distal circumflex, 90% nondominant right coronary artery  CABG 08/06/00 by Dr. Nils Pyle with LIMA to LAD, SVG to diagonal, SVG to right coronary artery, circumflex was noted to be too small to graft    . CAP (community acquired pneumonia) 04/16/2016   See cxr 04/15/16 > treated with 7 days of Levaquin 500 mg daily with resolution radiographically and clinically.  . Cataract   . Cerumen impaction 06/05/2015  . CHF (congestive heart failure), NYHA class III ([place]) 02/20/2014  . Dyspnea    with exertion   . Dyspnea on exertion 11/08/2013   Followed as Primary Care Patient/ [facility]/ [name]  - new onset early June 2015  - 11/08/2013  Walked RA x 3 laps @ 185 ft each  stopped due to  End of study, mild sob, no cp and no desat, EKG ok  - Cardiac w/u inconclusive but not clearly having ischemia  11/24/13   - 09/11/2016  Walked RA x 3 laps @ 185 ft each stopped due to  End of study, nl pace, no sob or desat   - Spirometry 09/11/2016    . Essential hypertension    Referred to Int med [facility] 12/12/2016   . GERD (gastroesophageal reflux disease)   . H/O hiatal hernia   . History of skin cancer    bilateral arms with removal  . Hyperlipidemia   . Hyperlipidemia LDL goal <70    Followed as Primary Care Patient/ [facility]/ [name]     - Target LDL < 70 as has IHD    . Hypertension   . Hypertensive heart disease    Followed as Primary Care Patient/ [facility]/ [name]     . Ischemic heart disease   . Left femoral hernia s/p laparoscopic repair 05/28/2017 05/28/2017  . Lumbar disc disease   . Myocardial infarction ([place]) 2002  . Right inguinal hernia s/p laparoscopic repair 05/28/2017 05/28/2017  . Stage 3 chronic kidney disease ([place]) 05/22/2015   trial off lasix/ppi/clinoril / [place] and renal eval 05/22/2015 >>>    . T wave inversion in EKG 02/20/2014  . Unstable angina ([place]) 11/23/2013    Patient Active Problem List  Diagnosis Date Noted  . Right inguinal hernia s/p laparoscopic repair 05/28/2017 05/28/2017  . Left femoral hernia s/p laparoscopic repair 05/28/2017 05/28/2017  . Cerumen impaction 06/05/2015  . Stage 3 chronic kidney disease ([place]) 05/22/2015  . Arthritis 06/04/2014  . CHF (congestive heart failure), NYHA class III ([place]) 02/20/2014  . BPH (benign prostatic hyperplasia) 02/20/2014  . T wave inversion in EKG 02/20/2014  . CAD (coronary artery disease) of artery bypass graft, Occluded VG-non dominant LCX medical therapy 11/25/2013  . Essential hypertension   . Unstable angina ([place]) 11/23/2013  . Dyspnea on exertion 11/08/2013  . Barrett's esophagus 10/27/2011  . GERD (gastroesophageal reflux disease) 07/14/2011  . CAD (coronary artery  disease), native coronary artery - LIMA-LAD, SVG-Diag, SVG-RCA 2002   . Hyperlipidemia LDL goal <70   . Hypertensive heart disease     Past Surgical History:  Procedure Laterality Date  . COLONOSCOPY    . CORONARY ARTERY BYPASS GRAFT  2002   LIMA-LAD, SVG-Diag, SVG-RCA  . EYE SURGERY Bilateral 1995, 2000   ioc for cataracts  . GROIN DISSECTION  01/23/2012   Procedure: Virl Son EXPLORATION;  Surgeon: Harl Bowie, MD;  Location: [facility];  Service: General;  Laterality: Left;  Left Inguinal Exploration, release of scar tissue, Placement of Mesh  . INGUINAL HERNIA REPAIR Left 1983  . INGUINAL HERNIA REPAIR Bilateral 05/28/2017   Procedure: LAPAROSCOPIC RIGHT AND LEFT INGUINAL HERNIA REPAIR WITH MESH;  Surgeon: Michael Boston, MD;  Location: [place];  Service: General;  Laterality: Bilateral;  . INSERTION OF MESH Bilateral 05/28/2017   Procedure: INSERTION OF MESH;  Surgeon: Michael Boston, MD;  Location: [place];  Service: General;  Laterality: Bilateral;  . LEFT HEART CATHETERIZATION WITH CORONARY ANGIOGRAM N/A 11/24/2013   Procedure: LEFT HEART CATHETERIZATION WITH CORONARY ANGIOGRAM;  Surgeon: Peter M Martinique, MD;  Location: [facility] CATH LAB;  Service: Cardiovascular;  Laterality: N/A;  . NOSE SURGERY  1985        Home Medications    Prior to Admission medications   Medication Sig Start Date End Date Taking? Authorizing Provider  amoxicillin-clavulanate (AUGMENTIN) 500-125 MG tablet Take 1 tablet (500 mg total) by mouth 2 (two) times daily for 14 days. 07/21/18 08/04/18  Janora Norlander, DO  aspirin (ADULT ASPIRIN EC LOW STRENGTH) 81 MG EC tablet Take 81 mg by mouth every morning.     [provider]  benzonatate (TESSALON PERLES) 100 MG capsule Take 1 capsule (100 mg total) by mouth 3 (three) times daily as needed. 07/21/18   Janora Norlander, DO  Cholecalciferol (VITAMIN D) 2000 UNITS tablet Take 2,000 Units by mouth daily.     [provider]  docusate sodium (COLACE) 100 MG capsule Take 1 capsule by mouth every other day    [provider]  doxazosin (CARDURA) 2 MG tablet Take 1 tablet (2 mg total) by mouth 2 (two) times daily. TAKE 1 TABLET BY MOUTH IN THE MORNING AND 1 TABLET AT BEDTIME 02/11/18   Dettinger, Fransisca Kaufmann, MD  fluticasone (FLONASE) 50 MCG/ACT nasal spray Place 2 sprays into both nostrils daily as needed for allergies or rhinitis.     [provider]  furosemide (LASIX) 40 MG tablet Take 1 tablet (40 mg total) by mouth daily as needed for fluid or edema. 02/11/18   Dettinger, Fransisca Kaufmann, MD  isosorbide mononitrate (IMDUR) 30 MG 24 hr tablet Take 30 mg by mouth daily.    [provider]  metoprolol succinate (TOPROL-XL) 50 MG 24 hr tablet TAKE 1 TABLET BY MOUTH IN THE MORNING AND 1/2 (ONE-HALF) IN THE EVENING 02/11/18   Dettinger, Fransisca Kaufmann, MD  Multiple Vitamin (MULTIVITAMIN WITH MINERALS) TABS Take 1 tablet by mouth daily.     [provider]  multivitamin-lutein (OCUVITE-LUTEIN) CAPS capsule Take 1 capsule by mouth daily.    [provider]  MYRBETRIQ 50 MG TB24 tablet Take 1 tablet by mouth daily. 10/09/15   [provider]  nitroGLYCERIN (NITROSTAT) 0.4 MG SL tablet Place 1 tablet (0.4 mg total) under the tongue every 5 (five) minutes as needed for chest pain (MAX 3 TABLETS). 02/11/18   Dettinger, Fransisca Kaufmann, MD  omeprazole (PRILOSEC) 20 MG capsule Take 1 capsule (20 mg total) by mouth 2 (two) times daily. 02/11/18   Dettinger, Fransisca Kaufmann, MD  oxymetazoline (AFRIN) 0.05 % nasal spray Place 2 sprays into the nose every 12 (twelve) hours as needed for congestion (x3 days). Dryness    [provider]  potassium chloride SA (KLOR-CON M20) 20 MEQ tablet TAKE ONE TABLET BY MOUTH ONCE DAILY WHEN  YOU  TAKE LASIX 08/12/17   Dettinger, Fransisca Kaufmann, MD  Simethicone (GAS-X EXTRA STRENGTH) 125 MG CAPS Take by mouth as directed. Reported on 10/10/2015 one after each meal     [provider]  simvastatin (ZOCOR) 20 MG tablet Take 1 tablet (20 mg total) by mouth at bedtime. 02/11/18   Dettinger, Fransisca Kaufmann, MD    Family History Family History  Problem Relation Age of Onset  . Breast cancer Mother   . Diabetes Mother   . Hypertension Father   . Dementia Father 2       early 43s  . Diabetes Sister   . COPD Brother   . Colon cancer Neg Hx   . Esophageal cancer Neg Hx   . Rectal cancer Neg Hx   . Stomach cancer Neg Hx     Social History Social History   Tobacco Use  . Smoking status: Never Smoker  . Smokeless tobacco: Former Systems developer    Types: Chew  Substance Use Topics  . Alcohol use: No    Alcohol/week: 0.0 standard drinks  . Drug use: No     Allergies   Patient has no known allergies.   Review of Systems Review of Systems  All systems reviewed and negative, other than as noted in HPI.  Physical Exam Updated Vital Signs BP 125/69 (BP Location: Left Arm)   Pulse (!) 58   Temp (!) 97.4 F (36.3 C) (Oral)   Resp 17   Ht 5' 10.5" (1.791 m)   Wt 76.6 kg   SpO2 99%   BMI 23.88 kg/m   Physical Exam Vitals signs and nursing note reviewed.  Constitutional:      General: He is not in acute distress.    Appearance: He is well-developed.  HENT:     Head: Normocephalic and atraumatic.  Eyes:     General:        Right eye: No discharge.        Left eye: No discharge.     Conjunctiva/sclera: Conjunctivae normal.  Neck:     Musculoskeletal: Neck supple.  Cardiovascular:     Rate and Rhythm: Normal rate and regular rhythm.     Heart sounds: Normal heart sounds. No murmur. No friction rub. No gallop.   Pulmonary:     Effort: Pulmonary effort is normal. No respiratory distress.  Breath sounds: Normal breath sounds.  Abdominal:     General: There is no distension.     Palpations: Abdomen is soft.     Tenderness: There is no abdominal tenderness.  Musculoskeletal:        General: No tenderness.  Skin:    General: Skin is  warm and dry.  Neurological:     Mental Status: He is alert.  Psychiatric:        Behavior: Behavior normal.        Thought Content: Thought content normal.      ED Treatments / Results  Labs (all labs ordered are listed, but only abnormal results are displayed) Labs Reviewed  BASIC METABOLIC PANEL - Abnormal; Notable for the following components:      Result Value   Glucose, Bld 100 (*)    BUN 24 (*)    Creatinine, Ser 1.68 (*)    GFR calc non Af Amer 39 (*)    GFR calc Af Amer 45 (*)    All other components within normal limits  URINALYSIS, ROUTINE W REFLEX MICROSCOPIC - Abnormal; Notable for the following components:   APPearance HAZY (*)    All other components within normal limits  CBC  CBG MONITORING, ED    EKG EKG Interpretation  Date/Time:  Wednesday July 28 2018 11:38:47 EDT Ventricular Rate:  66 PR Interval:    QRS Duration: 103 QT Interval:  414 QTC Calculation: 434 R Axis:   22 Text Interpretation:  Sinus rhythm Short PR interval Nonspecific repol abnormality, lateral leads No significant change since last tracing Confirmed by Virgel Manifold [phone]) on 07/28/2018 12:20:28 PM   Radiology No results found.  Procedures Procedures (including critical care time)  Medications Ordered in ED Medications - No data to display   Initial Impression / Assessment and Plan / ED Course  I have reviewed the triage vital signs and the nursing notes.  Pertinent labs & imaging results that were available during my care of the patient were reviewed by me and considered in my medical decision making (see chart for details).        78 year old male with dizziness.  Sounds like near syncope, probably from orthostasis.  The way describes the symptoms seem pretty consistent with this.  He is orthostatic with EMS as well.  His work-up here in the emergency room was pretty unremarkable.  He is now completely asymptomatic.  I have a low suspicion for emergent process.   Advised to take his time with changes in position.  Stay well-hydrated.  Emergent return precautions were discussed.  Outpatient follow-up otherwise.  Final Clinical Impressions(s) / ED Diagnoses   Final diagnoses:  Dizziness    ED Discharge Orders    None       Virgel Manifold, MD 07/30/18 1258

## 2018-08-04 ENCOUNTER — Ambulatory Visit (INDEPENDENT_AMBULATORY_CARE_PROVIDER_SITE_OTHER): Payer: Medicare Other | Admitting: Family Medicine

## 2018-08-04 ENCOUNTER — Encounter: Payer: Self-pay | Admitting: Family Medicine

## 2018-08-04 ENCOUNTER — Other Ambulatory Visit: Payer: Self-pay

## 2018-08-04 DIAGNOSIS — M545 Low back pain, unspecified: Secondary | ICD-10-CM

## 2018-08-04 DIAGNOSIS — I951 Orthostatic hypotension: Secondary | ICD-10-CM

## 2018-08-04 MED ORDER — DOXAZOSIN MESYLATE 1 MG PO TABS: 1.0000 mg | ORAL_TABLET | Freq: Two times a day (BID) | ORAL | 3 refills | Status: DC

## 2018-08-04 MED ORDER — TRAMADOL HCL 50 MG PO TABS: 25.0000 mg | ORAL_TABLET | Freq: Two times a day (BID) | ORAL | 1 refills | Status: DC | PRN

## 2018-08-04 MED ORDER — OMEPRAZOLE 20 MG PO CPDR: 20.0000 mg | DELAYED_RELEASE_CAPSULE | Freq: Two times a day (BID) | ORAL | 3 refills | Status: DC

## 2018-08-04 NOTE — Progress Notes (Signed)
Virtual Visit via telephone Note  I connected with Isaac [name] on 08/04/18 at 1040 by telephone and verified that I am speaking with the correct person using two identifiers. Isaac [name] is currently located at home and son-in law, [name] are currently with her during visit. The provider, Fransisca Kaufmann Estil Vallee, MD is located in their office at time of visit.  Call ended at 1058  I discussed the limitations, risks, security and privacy concerns of performing an evaluation and management service by telephone and the availability of in person appointments. I also discussed with the patient that there may be a patient responsible charge related to this service. The patient expressed understanding and agreed to proceed.   History and Present Illness: Patient was in the ED on 07/28/2018 for a near syncopal episode and lightheaded and weakness.  When he gets up he feels dizzy.  Sometimes he doesn't feel like this but sometimes he does.  His BP 96/60 as soon as he sat today after feeling that way again today.  They say he feels fine when he sitting there but as soon as he stands up he just feels weak and lightheaded and dizzy and just not well.  He denies any chest pain or shortness of breath or palpitations.  Looks like they did a can panel and urinalysis which came back normal from the emergency department.  No diagnosis [name].  Outpatient Encounter Medications as of 08/04/2018  Medication Sig  . amoxicillin-clavulanate (AUGMENTIN) 500-125 MG tablet Take 1 tablet (500 mg total) by mouth 2 (two) times daily for 14 days.  [name] aspirin (ADULT ASPIRIN EC LOW STRENGTH) 81 MG EC tablet Take 81 mg by mouth every morning.   . benzonatate (TESSALON PERLES) 100 MG capsule Take 1 capsule (100 mg total) by mouth 3 (three) times daily as needed.  . Cholecalciferol (VITAMIN D) 2000 UNITS tablet Take 2,000 Units by mouth daily.   [name] docusate sodium (COLACE) 100 MG capsule Take 1 capsule by mouth every other day  .  doxazosin (CARDURA) 2 MG tablet Take 1 tablet (2 mg total) by mouth 2 (two) times daily. TAKE 1 TABLET BY MOUTH IN THE MORNING AND 1 TABLET AT BEDTIME  . fluticasone (FLONASE) 50 MCG/ACT nasal spray Place 2 sprays into both nostrils daily as needed for allergies or rhinitis.   . furosemide (LASIX) 40 MG tablet Take 1 tablet (40 mg total) by mouth daily as needed for fluid or edema.  . isosorbide mononitrate (IMDUR) 30 MG 24 hr tablet Take 30 mg by mouth daily.  . metoprolol succinate (TOPROL-XL) 50 MG 24 hr tablet TAKE 1 TABLET BY MOUTH IN THE MORNING AND 1/2 (ONE-HALF) IN THE EVENING  . Multiple Vitamin (MULTIVITAMIN WITH MINERALS) TABS Take 1 tablet by mouth daily.   . multivitamin-lutein (OCUVITE-LUTEIN) CAPS capsule Take 1 capsule by mouth daily.  [name] MYRBETRIQ 50 MG TB24 tablet Take 1 tablet by mouth daily.  . nitroGLYCERIN (NITROSTAT) 0.4 MG SL tablet Place 1 tablet (0.4 mg total) under the tongue every 5 (five) minutes as needed for chest pain (MAX 3 TABLETS).  [name] omeprazole (PRILOSEC) 20 MG capsule Take 1 capsule (20 mg total) by mouth 2 (two) times daily.  [name] oxymetazoline (AFRIN) 0.05 % nasal spray Place 2 sprays into the nose every 12 (twelve) hours as needed for congestion (x3 days). Dryness  . potassium chloride SA (KLOR-CON M20) 20 MEQ tablet TAKE ONE TABLET BY MOUTH ONCE DAILY WHEN  YOU  TAKE LASIX  .  Simethicone (GAS-X EXTRA STRENGTH) 125 MG CAPS Take by mouth as directed. Reported on 10/10/2015 one after each meal  . simvastatin (ZOCOR) 20 MG tablet Take 1 tablet (20 mg total) by mouth at bedtime.   No facility-administered encounter medications on file as of 08/04/2018.     Review of Systems  Constitutional: Negative for chills and fever.  Eyes: Negative for visual disturbance.  Respiratory: Negative for shortness of breath and wheezing.   Cardiovascular: Negative for chest pain and leg swelling.  Neurological: Positive for dizziness and light-headedness. Negative for weakness and  numbness.  All other systems reviewed and are negative.   Observations/Objective: Patient is not in any distress and sounds comfortable on the phone, most of the conversation was with his son-in-law who is taking care of him.  Assessment and Plan: Problem List Items Addressed This Visit    None    Visit Diagnoses    Orthostatic hypotension    -  Primary   Relevant Medications   doxazosin (CARDURA) 1 MG tablet   Acute bilateral low back pain without sciatica       Relevant Medications   traMADol (ULTRAM) 50 MG tablet       Follow Up Instructions:  Sounds like patient was having orthostatic hypotension, likely culprit would be the doxazosin so we will cut back on the doxazosin to 1 mg twice a day and see if that makes a difference, he said his blood pressures have been running low especially after he gets up and then sits back down they will still be low.  The EMS noted the same thing as well as in the ED.  Contact us back in 1 to 2 weeks if he is still having issues or concerns with this.  Patient also says he has some low back pain and has had tramadol before when this flares up, recommended that they not use the tramadol until the dizziness has resolved but that they can use it sparingly in the future.   I discussed the assessment and treatment plan with the patient. The patient was provided an opportunity to ask questions and all were answered. The patient agreed with the plan and demonstrated an understanding of the instructions.   The patient was advised to call back or seek an in-person evaluation if the symptoms worsen or if the condition fails to improve as anticipated.  The above assessment and management plan was discussed with the patient. The patient verbalized understanding of and has agreed to the management plan. Patient is aware to call the clinic if symptoms persist or worsen. Patient is aware when to return to the clinic for a follow-up visit. Patient educated on when  it is appropriate to go to the emergency department.    I provided 18 minutes of non-face-to-face time during this encounter.    Worthy Rancher, MD

## 2018-08-16 ENCOUNTER — Encounter: Payer: Self-pay | Admitting: Family Medicine

## 2018-08-16 ENCOUNTER — Ambulatory Visit: Payer: Medicare Other | Admitting: Family Medicine

## 2018-08-16 ENCOUNTER — Other Ambulatory Visit: Payer: Self-pay

## 2018-08-16 VITALS — BP 105/64 | HR 106 | Temp 97.0°F | Ht 70.5 in | Wt 173.8 lb

## 2018-08-16 DIAGNOSIS — E785 Hyperlipidemia, unspecified: Secondary | ICD-10-CM | POA: Diagnosis not present

## 2018-08-16 DIAGNOSIS — I10 Essential (primary) hypertension: Secondary | ICD-10-CM

## 2018-08-16 DIAGNOSIS — I110 Hypertensive heart disease with heart failure: Secondary | ICD-10-CM | POA: Diagnosis not present

## 2018-08-16 DIAGNOSIS — I5042 Chronic combined systolic (congestive) and diastolic (congestive) heart failure: Secondary | ICD-10-CM

## 2018-08-16 DIAGNOSIS — K219 Gastro-esophageal reflux disease without esophagitis: Secondary | ICD-10-CM

## 2018-08-16 DIAGNOSIS — N183 Chronic kidney disease, stage 3 unspecified: Secondary | ICD-10-CM

## 2018-08-16 DIAGNOSIS — L821 Other seborrheic keratosis: Secondary | ICD-10-CM

## 2018-08-16 NOTE — Progress Notes (Signed)
BP 105/64   Pulse (!) 106   Temp (!) 97 F (36.1 C) (Oral)   Ht 5' 10.5" (1.791 m)   Wt 173 lb 12.8 oz (78.8 kg)   BMI 24.59 kg/m    Subjective:   Patient ID: Isaac [name], male    DOB: [birth year], 78 y.o.   MRN: [id number]  HPI: Isaac [name] is a 78 y.o. male presenting on 08/16/2018 for Hypertension (6 month follow up) and Hyperlipidemia   HPI Hypertension Patient is currently on metoprolol and doxazosin, and their blood pressure today is 105/64.  Patient's lightheadedness and dizziness has greatly improved since we lowered the doxazosin. Patient denies headaches, blurred vision, chest pains, shortness of breath, or weakness. Denies any side effects from medication and is content with current medication.  Patient has stage III CKD associated with this.  He did take some home blood pressures both laying sitting and standing and his orthostasis is still mildly present but much improved on the numbers as well as symptomatically  Hyperlipidemia Patient is coming in for recheck of his hyperlipidemia. The patient is currently taking simvastatin. They deny any issues with myalgias or history of liver damage from it. They deny any focal numbness or weakness or chest pain.   GERD Patient is currently on omeprazole.  She denies any major symptoms or abdominal pain or belching or burping. She denies any blood in her stool or lightheadedness or dizziness.   Patient had a few spots on his forehead that he wanted looked at that are relatively new over the past few months, he said 1 of them he caught with a comb and it did bleed because it stuck up.  He has 3 on his left temple region and one on his right temple region near the scalp line that are consistent with seborrheic keratosis  Relevant past medical, surgical, family and social history reviewed and updated as indicated. Interim medical history since our last visit reviewed. Allergies and medications reviewed and updated.  Review of  Systems  Constitutional: Negative for chills and fever.  Eyes: Negative for visual disturbance.  Respiratory: Negative for shortness of breath and wheezing.   Cardiovascular: Negative for chest pain and leg swelling.  Musculoskeletal: Negative for back pain and gait problem.  Skin: Negative for rash.  Neurological: Positive for dizziness and light-headedness. Negative for seizures, speech difficulty, weakness and numbness.  All other systems reviewed and are negative.   Per HPI unless specifically indicated above   Allergies as of 08/16/2018   No Known Allergies     Medication List       Accurate as of August 16, 2018  8:21 AM. Always use your most recent med list.        Adult Aspirin EC Low Strength 81 MG EC tablet Generic drug:  aspirin Take 81 mg by mouth every morning.   docusate sodium 100 MG capsule Commonly known as:  COLACE Take 1 capsule by mouth every other day   doxazosin 1 MG tablet Commonly known as:  CARDURA Take 1 tablet (1 mg total) by mouth 2 (two) times daily. TAKE 1 TABLET BY MOUTH IN THE MORNING AND 1 TABLET AT BEDTIME   fluticasone 50 MCG/ACT nasal spray Commonly known as:  FLONASE Place 2 sprays into both nostrils daily as needed for allergies or rhinitis.   furosemide 40 MG tablet Commonly known as:  LASIX Take 1 tablet (40 mg total) by mouth daily as needed for fluid or edema.  Gas-X Extra Strength 125 MG Caps Generic drug:  Simethicone Take by mouth as directed. Reported on 10/10/2015 one after each meal   metoprolol succinate 50 MG 24 hr tablet Commonly known as:  TOPROL-XL TAKE 1 TABLET BY MOUTH IN THE MORNING AND 1/2 (ONE-HALF) IN THE EVENING   multivitamin with minerals Tabs tablet Take 1 tablet by mouth daily.   multivitamin-lutein Caps capsule Take 1 capsule by mouth daily.   nitroGLYCERIN 0.4 MG SL tablet Commonly known as:  NITROSTAT Place 1 tablet (0.4 mg total) under the tongue every 5 (five) minutes as needed for chest  pain (MAX 3 TABLETS).   omeprazole 20 MG capsule Commonly known as:  PRILOSEC Take 1 capsule (20 mg total) by mouth 2 (two) times daily.   oxymetazoline 0.05 % nasal spray Commonly known as:  AFRIN Place 2 sprays into the nose every 12 (twelve) hours as needed for congestion (x3 days). Dryness   potassium chloride SA 20 MEQ tablet Commonly known as:  Klor-Con M20 TAKE ONE TABLET BY MOUTH ONCE DAILY WHEN  YOU  TAKE LASIX   simvastatin 20 MG tablet Commonly known as:  ZOCOR Take 1 tablet (20 mg total) by mouth at bedtime.   traMADol 50 MG tablet Commonly known as:  ULTRAM Take 0.5 tablets (25 mg total) by mouth every 12 (twelve) hours as needed.   Vitamin D 50 MCG (2000 UT) tablet Take 2,000 Units by mouth daily.        Objective:   BP 105/64   Pulse (!) 106   Temp (!) 97 F (36.1 C) (Oral)   Ht 5' 10.5" (1.791 m)   Wt 173 lb 12.8 oz (78.8 kg)   BMI 24.59 kg/m   Wt Readings from Last 3 Encounters:  08/16/18 173 lb 12.8 oz (78.8 kg)  07/28/18 168 lb 12.8 oz (76.6 kg)  02/11/18 180 lb (81.6 kg)    Physical Exam Vitals signs and nursing note reviewed.  Constitutional:      General: He is not in acute distress.    Appearance: He is well-developed. He is not diaphoretic.  Eyes:     General: No scleral icterus.       Right eye: No discharge.     Conjunctiva/sclera: Conjunctivae normal.     Pupils: Pupils are equal, round, and reactive to light.  Neck:     Musculoskeletal: Neck supple.     Thyroid: No thyromegaly.  Cardiovascular:     Rate and Rhythm: Normal rate and regular rhythm.     Heart sounds: Normal heart sounds. No murmur.  Pulmonary:     Effort: Pulmonary effort is normal. No respiratory distress.     Breath sounds: Normal breath sounds. No wheezing.  Musculoskeletal: Normal range of motion.  Lymphadenopathy:     Cervical: No cervical adenopathy.  Skin:    General: Skin is warm and dry.     Findings: No rash.     Comments: 3 seborrheic keratosis  on left temple region near scalp line and one on right  Neurological:     Mental Status: He is alert and oriented to person, place, and time.     Coordination: Coordination normal.  Psychiatric:        Behavior: Behavior normal.       Assessment & Plan:   Problem List Items Addressed This Visit      Cardiovascular and Mediastinum   Hypertensive heart disease (Chronic)   Relevant Orders   CMP14+EGFR   Lipid panel  Essential hypertension - Primary   Relevant Orders   CMP14+EGFR   Lipid panel     Digestive   GERD (gastroesophageal reflux disease) (Chronic)     Genitourinary   Stage 3 chronic kidney disease (HCC)   Relevant Orders   CMP14+EGFR     Other   Hyperlipidemia LDL goal <70 (Chronic)   Relevant Orders   Lipid panel    Other Visit Diagnoses    Seborrheic keratosis           Follow up plan: Return in about 4 months (around 12/16/2018), or if symptoms worsen or fail to improve, for Hypertension and hyperlipidemia follow-up.  Counseling provided for all of the vaccine components No orders of the defined types were placed in this encounter.   Caryl Pina, MD [facility] 08/16/2018, 8:21 AM

## 2018-08-17 LAB — CMP14+EGFR
ALT: 13 IU/L (ref 0–44)
AST: 17 IU/L (ref 0–40)
Albumin/Globulin Ratio: 1.5 (ref 1.2–2.2)
Albumin: 4.3 g/dL (ref 3.7–4.7)
Alkaline Phosphatase: 80 IU/L (ref 39–117)
BUN/Creatinine Ratio: 11 (ref 10–24)
BUN: 20 mg/dL (ref 8–27)
Bilirubin Total: 0.4 mg/dL (ref 0.0–1.2)
CO2: 19 mmol/L — ABNORMAL LOW (ref 20–29)
Calcium: 9.6 mg/dL (ref 8.6–10.2)
Chloride: 101 mmol/L (ref 96–106)
Creatinine, Ser: 1.81 mg/dL — ABNORMAL HIGH (ref 0.76–1.27)
GFR calc Af Amer: 41 mL/min/{1.73_m2} — ABNORMAL LOW (ref >59)
GFR calc non Af Amer: 35 mL/min/{1.73_m2} — ABNORMAL LOW (ref >59)
Globulin, Total: 2.8 g/dL (ref 1.5–4.5)
Glucose: 175 mg/dL — ABNORMAL HIGH (ref 65–99)
Potassium: 4.3 mmol/L (ref 3.5–5.2)
Sodium: 143 mmol/L (ref 134–144)
Total Protein: 7.1 g/dL (ref 6.0–8.5)

## 2018-08-17 LAB — LIPID PANEL
Chol/HDL Ratio: 3.4 ratio (ref 0.0–5.0)
Cholesterol, Total: 162 mg/dL (ref 100–199)
HDL: 48 mg/dL (ref >39)
LDL Calculated: 53 mg/dL (ref 0–99)
Triglycerides: 303 mg/dL — ABNORMAL HIGH (ref 0–149)
VLDL Cholesterol Cal: 61 mg/dL — ABNORMAL HIGH (ref 5–40)

## 2018-08-30 ENCOUNTER — Other Ambulatory Visit: Payer: Self-pay

## 2018-08-30 ENCOUNTER — Encounter (HOSPITAL_COMMUNITY): Payer: Self-pay

## 2018-08-30 ENCOUNTER — Emergency Department (HOSPITAL_COMMUNITY)
Admission: EM | Admit: 2018-08-30 | Discharge: 2018-08-30 | Disposition: A | Discharge status: Home / Self Care | Payer: Medicare Other | Attending: Emergency Medicine | Admitting: Emergency Medicine

## 2018-08-30 DIAGNOSIS — L0291 Cutaneous abscess, unspecified: Secondary | ICD-10-CM

## 2018-08-30 DIAGNOSIS — Z79899 Other long term (current) drug therapy: Secondary | ICD-10-CM | POA: Diagnosis not present

## 2018-08-30 DIAGNOSIS — Z7982 Long term (current) use of aspirin: Secondary | ICD-10-CM | POA: Diagnosis not present

## 2018-08-30 DIAGNOSIS — I130 Hypertensive heart and chronic kidney disease with heart failure and stage 1 through stage 4 chronic kidney disease, or unspecified chronic kidney disease: Secondary | ICD-10-CM | POA: Insufficient documentation

## 2018-08-30 DIAGNOSIS — I509 Heart failure, unspecified: Secondary | ICD-10-CM | POA: Insufficient documentation

## 2018-08-30 DIAGNOSIS — N183 Chronic kidney disease, stage 3 (moderate): Secondary | ICD-10-CM | POA: Diagnosis not present

## 2018-08-30 DIAGNOSIS — L02413 Cutaneous abscess of right upper limb: Secondary | ICD-10-CM | POA: Diagnosis not present

## 2018-08-30 DIAGNOSIS — L03113 Cellulitis of right upper limb: Secondary | ICD-10-CM

## 2018-08-30 DIAGNOSIS — I259 Chronic ischemic heart disease, unspecified: Secondary | ICD-10-CM | POA: Insufficient documentation

## 2018-08-30 DIAGNOSIS — M25521 Pain in right elbow: Secondary | ICD-10-CM | POA: Diagnosis present

## 2018-08-30 MED ORDER — LIDOCAINE HCL (PF) 1 % IJ SOLN
5.0000 mL | Freq: Once | INTRAMUSCULAR | Status: AC
  Administered 2018-08-30: 18:00:00 4 mL
  Filled 2018-08-30: qty 6

## 2018-08-30 MED ORDER — DOXYCYCLINE HYCLATE 100 MG PO TABS
100.0000 mg | ORAL_TABLET | Freq: Once | ORAL | Status: AC
  Administered 2018-08-30: 100 mg via ORAL
  Filled 2018-08-30: qty 1

## 2018-08-30 MED ORDER — POVIDONE-IODINE 10 % EX SOLN
CUTANEOUS | Status: DC | PRN
  Administered 2018-08-30: 18:00:00 via TOPICAL
  Filled 2018-08-30: qty 15

## 2018-08-30 MED ORDER — DOXYCYCLINE HYCLATE 100 MG PO CAPS: 100.0000 mg | ORAL_CAPSULE | Freq: Two times a day (BID) | ORAL | 0 refills | Status: DC

## 2018-08-30 NOTE — ED Provider Notes (Signed)
[facility] EMERGENCY DEPARTMENT Provider Note   CSN: [id number] Arrival date & time: 08/30/18  1544    History   Chief Complaint Chief Complaint  Patient presents with   Elbow Pain    HPI Isaac [name] is a 78 y.o. male with no prior history of skin infections presenting with a small abscess of his proximal right forearm, present for the past 4 days, with increased pain, redness and swelling around the site.  He denies fevers or chills, denies skin trauma, no known insect bites but speculates this as a possible source.  He has taken tylenol with improvement in pain.  Denies fevers, chills, n/v, abdominal pain or any other complaints.     The history is provided by the patient.    Past Medical History:  Diagnosis Date   Allergic rhinitis    Allergy    Arthritis 06/04/2014   Trial of sulindac 05/30/14 since cri risk with other nsaids    Barrett's esophagus 10/27/2011   BPH (benign prostatic hyperplasia) 02/20/2014   CAD (coronary artery disease) of artery bypass graft, Occluded VG-non dominant LCX medical therapy 11/25/2013   CAD (coronary artery disease), native coronary artery - LIMA-LAD, SVG-Diag, SVG-RCA 2002    Catheterization 2002 showing occluded LAD, 99% diagonal, occluded distal circumflex, 90% nondominant right coronary artery  CABG 08/06/00 by Dr. Nils Pyle with LIMA to LAD, SVG to diagonal, SVG to right coronary artery, circumflex was noted to be too small to graft     CAP (community acquired pneumonia) 04/16/2016   See cxr 04/15/16 > treated with 7 days of Levaquin 500 mg daily with resolution radiographically and clinically.   Cataract    Cerumen impaction 06/05/2015   CHF (congestive heart failure), NYHA class III ([place]) 02/20/2014   Dyspnea    with exertion    Dyspnea on exertion 11/08/2013   Followed as Primary Care Patient/ [facility]/ [name]  - new onset early June 2015  - 11/08/2013  Walked RA x 3 laps @ 185 ft each stopped due to  End of study,  mild sob, no cp and no desat, EKG ok  - Cardiac w/u inconclusive but not clearly having ischemia  11/24/13   - 09/11/2016  Walked RA x 3 laps @ 185 ft each stopped due to  End of study, nl pace, no sob or desat   - Spirometry 09/11/2016     Essential hypertension    Referred to Int med [facility] 12/12/2016    GERD (gastroesophageal reflux disease)    H/O hiatal hernia    History of skin cancer    bilateral arms with removal   Hyperlipidemia    Hyperlipidemia LDL goal <70    Followed as Primary Care Patient/ [facility]/ [name]     - Target LDL < 70 as has IHD     Hypertension    Hypertensive heart disease    Followed as Primary Care Patient/ [facility]/ [name]      Ischemic heart disease    Left femoral hernia s/p laparoscopic repair 05/28/2017 05/28/2017   Lumbar disc disease    Myocardial infarction [facility]) 2002   Right inguinal hernia s/p laparoscopic repair 05/28/2017 05/28/2017   Stage 3 chronic kidney disease ([place]) 05/22/2015   trial off lasix/ppi/clinoril / [place] and renal eval 05/22/2015 >>>     T wave inversion in EKG 02/20/2014   Unstable angina ([place]) 11/23/2013    Patient Active Problem List   Diagnosis Date Noted   Right inguinal  hernia s/p laparoscopic repair 05/28/2017 05/28/2017   Left femoral hernia s/p laparoscopic repair 05/28/2017 05/28/2017   Cerumen impaction 06/05/2015   Stage 3 chronic kidney disease ([place]) 05/22/2015   Arthritis 06/04/2014   CHF (congestive heart failure), NYHA class III ([place]) 02/20/2014   BPH (benign prostatic hyperplasia) 02/20/2014   T wave inversion in EKG 02/20/2014   CAD (coronary artery disease) of artery bypass graft, Occluded VG-non dominant LCX medical therapy 11/25/2013   Essential hypertension    Unstable angina ([place]) 11/23/2013   Dyspnea on exertion 11/08/2013   Barrett's esophagus 10/27/2011   GERD (gastroesophageal reflux disease) 07/14/2011   CAD (coronary artery disease), native coronary artery -  LIMA-LAD, SVG-Diag, SVG-RCA 2002    Hyperlipidemia LDL goal <70    Hypertensive heart disease     Past Surgical History:  Procedure Laterality Date   COLONOSCOPY     CORONARY ARTERY BYPASS GRAFT  2002   LIMA-LAD, SVG-Diag, SVG-RCA   EYE SURGERY Bilateral 1995, 2000   ioc for cataracts   GROIN DISSECTION  01/23/2012   Procedure: Virl Son EXPLORATION;  Surgeon: Harl Bowie, MD;  Location: [facility];  Service: General;  Laterality: Left;  Left Inguinal Exploration, release of scar tissue, Placement of Mesh   INGUINAL HERNIA REPAIR Left 1983   INGUINAL HERNIA REPAIR Bilateral 05/28/2017   Procedure: LAPAROSCOPIC RIGHT AND LEFT INGUINAL HERNIA REPAIR WITH MESH;  Surgeon: Michael Boston, MD;  Location: [facility];  Service: General;  Laterality: Bilateral;   INSERTION OF MESH Bilateral 05/28/2017   Procedure: INSERTION OF MESH;  Surgeon: Michael Boston, MD;  Location: [facility];  Service: General;  Laterality: Bilateral;   LEFT HEART CATHETERIZATION WITH CORONARY ANGIOGRAM N/A 11/24/2013   Procedure: LEFT HEART CATHETERIZATION WITH CORONARY ANGIOGRAM;  Surgeon: Peter M Martinique, MD;  Location: [facility] C[facility] LAB;  Service: Cardiovascular;  Laterality: N/A;   NOSE SURGERY  1985        Home Medications    Prior to Admission medications   Medication Sig Start Date End Date Taking? Authorizing Provider  doxazosin (CARDURA) 1 MG tablet Take 1 tablet (1 mg total) by mouth 2 (two) times daily. TAKE 1 TABLET BY MOUTH IN THE MORNING AND 1 TABLET AT BEDTIME 08/04/18  Yes Dettinger, Fransisca Kaufmann, MD  metoprolol succinate (TOPROL-XL) 50 MG 24 hr tablet TAKE 1 TABLET BY MOUTH IN THE MORNING AND 1/2 (ONE-HALF) IN THE EVENING Patient taking differently: Take 25-50 mg by mouth See admin instructions. TAKE 1 TABLET BY MOUTH IN THE MORNING AND 1/2 (ONE-HALF) IN THE EVENING 02/11/18  Yes Dettinger, Fransisca Kaufmann, MD  nitroGLYCERIN (NITROSTAT) 0.4 MG SL tablet Place 1 tablet (0.4 mg total)  under the tongue every 5 (five) minutes as needed for chest pain (MAX 3 TABLETS). 02/11/18  Yes Dettinger, Fransisca Kaufmann, MD  omeprazole (PRILOSEC) 20 MG capsule Take 1 capsule (20 mg total) by mouth 2 (two) times daily. 08/04/18  Yes Dettinger, Fransisca Kaufmann, MD  simvastatin (ZOCOR) 20 MG tablet Take 1 tablet (20 mg total) by mouth at bedtime. 02/11/18  Yes Dettinger, Fransisca Kaufmann, MD  aspirin (ADULT ASPIRIN EC LOW STRENGTH) 81 MG EC tablet Take 81 mg by mouth every morning.     [provider]  Cholecalciferol (VITAMIN D) 2000 UNITS tablet Take 2,000 Units by mouth daily.     [provider]  docusate sodium (COLACE) 100 MG capsule Take 1 capsule by mouth every other day    [provider]  doxycycline (  VIBRAMYCIN) 100 MG capsule Take 1 capsule (100 mg total) by mouth 2 (two) times daily. 08/30/18   Evalee Jefferson, PA-C  fluticasone (FLONASE) 50 MCG/ACT nasal spray Place 2 sprays into both nostrils daily as needed for allergies or rhinitis.     [provider]  furosemide (LASIX) 40 MG tablet Take 1 tablet (40 mg total) by mouth daily as needed for fluid or edema. 02/11/18   Dettinger, Fransisca Kaufmann, MD  Multiple Vitamin (MULTIVITAMIN WITH MINERALS) TABS Take 1 tablet by mouth daily.     [provider]  multivitamin-lutein (OCUVITE-LUTEIN) CAPS capsule Take 1 capsule by mouth daily.    [provider]  oxymetazoline (AFRIN) 0.05 % nasal spray Place 2 sprays into the nose every 12 (twelve) hours as needed for congestion (x3 days). Dryness    [provider]  potassium chloride SA (KLOR-CON M20) 20 MEQ tablet TAKE ONE TABLET BY MOUTH ONCE DAILY WHEN  YOU  TAKE LASIX 08/12/17   Dettinger, Fransisca Kaufmann, MD  Simethicone (GAS-X EXTRA STRENGTH) 125 MG CAPS Take by mouth as directed. Reported on 10/10/2015 one after each meal    [provider]  traMADol (ULTRAM) 50 MG tablet Take 0.5 tablets (25 mg total) by mouth every 12 (twelve) hours as needed. 08/04/18    Dettinger, Fransisca Kaufmann, MD    Family History Family History  Problem Relation Age of Onset   Breast cancer Mother    Diabetes Mother    Hypertension Father    Dementia Father 66       early 62s   Diabetes Sister    COPD Brother    Colon cancer Neg Hx    Esophageal cancer Neg Hx    Rectal cancer Neg Hx    Stomach cancer Neg Hx     Social History Social History   Tobacco Use   Smoking status: Never Smoker   Smokeless tobacco: Former Systems developer    Types: Chew  Substance Use Topics   Alcohol use: No    Alcohol/week: 0.0 standard drinks   Drug use: No     Allergies   Patient has no known allergies.   Review of Systems Review of Systems  Constitutional: Negative for chills and fever.  Respiratory: Negative for shortness of breath and wheezing.   Gastrointestinal: Negative for abdominal pain, nausea and vomiting.  Skin: Positive for color change. Negative for wound.  Neurological: Negative for numbness.     Physical Exam Updated Vital Signs BP 117/69 (BP Location: Left Arm)    Pulse 68    Temp 98.2 F (36.8 C) (Oral)    Resp 16    Ht 5\' 10"  (1.778 m)    Wt 77.1 kg    SpO2 100%    BMI 24.39 kg/m   Physical Exam Constitutional:      General: He is not in acute distress.    Appearance: He is well-developed.  HENT:     Head: Normocephalic.  Neck:     Musculoskeletal: Neck supple.  Cardiovascular:     Rate and Rhythm: Normal rate.  Pulmonary:     Effort: Pulmonary effort is normal.     Breath sounds: No wheezing.  Musculoskeletal: Normal range of motion.  Skin:    Findings: Abscess present.     Comments: 1 cm induration right volar proximal forearm with central pustule.  4 cm surrounding erythema.  No red streaking.      ED Treatments / Results  Labs (all labs ordered are listed,  but only abnormal results are displayed) Labs Reviewed - No data to display  EKG None  Radiology No results found.  Procedures Procedures (including critical  care time)  INCISION AND DRAINAGE Performed by: Evalee Jefferson Consent: Verbal consent obtained. Risks and benefits: risks, benefits and alternatives were discussed Type: abscess  Body area: right forearm  Anesthesia: local infiltration  Incision was made with a scalpel.  Local anesthetic: lidocaine 1% without epinephrine  Anesthetic total: 3 ml  Complexity: complex Blunt dissection to break up loculations  Drainage: purulent  Drainage amount: small, one small drop of purulence obtained.  Packing material: none  Patient tolerance: Patient tolerated the procedure well with no immediate complications.     Medications Ordered in ED Medications  povidone-iodine (BETADINE) 10 % external solution (has no administration in time range)  doxycycline (VIBRA-TABS) tablet 100 mg (has no administration in time range)  lidocaine (PF) (XYLOCAINE) 1 % injection 5 mL (4 mLs Other Given by Other 08/30/18 1734)     Initial Impression / Assessment and Plan / ED Course  I have reviewed the triage vital signs and the nursing notes.  Pertinent labs & imaging results that were available during my care of the patient were reviewed by me and considered in my medical decision making (see chart for details).        Pt with small abscess and cellulitis right forearm.  Advised warm epsom salt soaks bid, doxycycline started. Plan f/u here or pcp for any worsened sx which was outlined for pt.   Final Clinical Impressions(s) / ED Diagnoses   Final diagnoses:  Abscess  Cellulitis of right upper extremity    ED Discharge Orders         Ordered    doxycycline (VIBRAMYCIN) 100 MG capsule  2 times daily     08/30/18 1719           Landis Martins 08/30/18 1741    Noemi Chapel, MD 08/30/18 [phone]

## 2018-08-30 NOTE — Discharge Instructions (Addendum)
Soak your elbow in warm epsom salt water twice daily for 10 minutes, then keep bandaged to protect the site.  Take your next dose of doxycycline tomorrow morning.

## 2018-08-30 NOTE — ED Triage Notes (Signed)
Pt noted to have swelling and redness to right elbow x 4 days. Noted to have a couple of scabbed areas on elbow

## 2018-09-07 ENCOUNTER — Ambulatory Visit (INDEPENDENT_AMBULATORY_CARE_PROVIDER_SITE_OTHER): Payer: Medicare Other | Admitting: Family

## 2018-09-07 ENCOUNTER — Other Ambulatory Visit: Payer: Self-pay

## 2018-09-07 ENCOUNTER — Encounter: Payer: Self-pay | Admitting: Family

## 2018-09-07 VITALS — BP 146/80 | HR 62 | Temp 97.4°F | Ht 70.0 in | Wt 172.0 lb

## 2018-09-07 DIAGNOSIS — L03113 Cellulitis of right upper limb: Secondary | ICD-10-CM | POA: Diagnosis not present

## 2018-09-07 DIAGNOSIS — Z09 Encounter for follow-up examination after completed treatment for conditions other than malignant neoplasm: Secondary | ICD-10-CM

## 2018-09-07 DIAGNOSIS — L0291 Cutaneous abscess, unspecified: Secondary | ICD-10-CM

## 2018-09-07 NOTE — Patient Instructions (Signed)
Skin Abscess  A skin abscess is an infected area on or under your skin that contains a collection of pus and other material. An abscess may also be called a furuncle, carbuncle, or boil. An abscess can occur in or on almost any part of your body. Some abscesses break open (rupture) on their own. Most continue to get worse unless they are treated. The infection can spread deeper into the body and eventually into your blood, which can make you feel ill. Treatment usually involves draining the abscess. What are the causes? An abscess occurs when germs, like bacteria, pass through your skin and cause an infection. This may be caused by:  A scrape or cut on your skin.  A puncture wound through your skin, including a needle injection or insect bite.  Blocked oil or sweat glands.  Blocked and infected hair follicles.  A cyst that forms beneath your skin (sebaceous cyst) and becomes infected. What increases the risk? This condition is more likely to develop in people who:  Have a weak body defense system (immune system).  Have diabetes.  Have dry and irritated skin.  Get frequent injections or use illegal IV drugs.  Have a foreign body in a wound, such as a splinter.  Have problems with their lymph system or veins. What are the signs or symptoms? Symptoms of this condition include:  A painful, firm bump under the skin.  A bump with pus at the top. This may break through the skin and drain. Other symptoms include:  Redness surrounding the abscess site.  Warmth.  Swelling of the lymph nodes (glands) near the abscess.  Tenderness.  A sore on the skin. How is this diagnosed? This condition may be diagnosed based on:  A physical exam.  Your medical history.  A sample of pus. This may be used to find out what is causing the infection.  Blood tests.  Imaging tests, such as an ultrasound, CT scan, or MRI. How is this treated? A small abscess that drains on its own may not  need treatment. Treatment for larger abscesses may include:  Moist heat or heat pack applied to the area several times a day.  A procedure to drain the abscess (incision and drainage).  Antibiotic medicines. For a severe abscess, you may first get antibiotics through an IV and then change to antibiotics by mouth. Follow these instructions at home: Medicines   Take over-the-counter and prescription medicines only as told by your health care provider.  If you were prescribed an antibiotic medicine, take it as told by your health care provider. Do not stop taking the antibiotic even if you start to feel better. Abscess care   If you have an abscess that has not drained, apply heat to the affected area. Use the heat source that your health care provider recommends, such as a moist heat pack or a heating pad. ? Place a towel between your skin and the heat source. ? Leave the heat on for 20-30 minutes. ? Remove the heat if your skin turns bright red. This is especially important if you are unable to feel pain, heat, or cold. You may have a greater risk of getting burned.  Follow instructions from your health care provider about how to take care of your abscess. Make sure you: ? Cover the abscess with a bandage (dressing). ? Change your dressing or gauze as told by your health care provider. ? Wash your hands with soap and water before you change the   dressing or gauze. If soap and water are not available, use hand sanitizer.  Check your abscess every day for signs of a worsening infection. Check for: ? More redness, swelling, or pain. ? More fluid or blood. ? Warmth. ? More pus or a bad smell. General instructions  To avoid spreading the infection: ? Do not share personal care items, towels, or hot tubs with others. ? Avoid making skin contact with other people.  Keep all follow-up visits as told by your health care provider. This is important. Contact a health care provider if you  have:  More redness, swelling, or pain around your abscess.  More fluid or blood coming from your abscess.  Warm skin around your abscess.  More pus or a bad smell coming from your abscess.  A fever.  Muscle aches.  Chills or a general ill feeling. Get help right away if you:  Have severe pain.  See red streaks on your skin spreading away from the abscess. Summary  A skin abscess is an infected area on or under your skin that contains a collection of pus and other material.  A small abscess that drains on its own may not need treatment.  Treatment for larger abscesses may include having a procedure to drain the abscess and taking an antibiotic. This information is not intended to replace advice given to you by your health care provider. Make sure you discuss any questions you have with your health care provider. Document Released: 01/22/2005 Document Revised: 05/28/2017 Document Reviewed: 05/28/2017 Elsevier Interactive Patient Education  2019 [employer]  

## 2018-09-07 NOTE — Progress Notes (Signed)
   Subjective:    Patient ID: Isaac [name], male    DOB: [birth year], 78 y.o.   MRN: [id number]  Chief Complaint  Patient presents with  . lump with soreness right arm    HPI PT presents to the office today to recheck abscess and cellulitis  on right lateral forearm. He went to the ED on 08/30/18 and was given doxycycline 100 mg BID for 10 days.  He reports the redness, swelling, and tenderness is greatly improved. He states he still have 2 more days of antibiotics.   Denies any fever. States it was draining a pink watery discharge for several days, but stopped yesterday.   Review of Systems  Skin: Positive for wound.  All other systems reviewed and are negative.      Objective:   Physical Exam Vitals signs reviewed.  Constitutional:      General: He is not in acute distress.    Appearance: He is well-developed.  HENT:     Head: Normocephalic.  Neck:     Musculoskeletal: Normal range of motion and neck supple.     Thyroid: No thyromegaly.  Cardiovascular:     Rate and Rhythm: Normal rate and regular rhythm.     Heart sounds: Normal heart sounds. No murmur.  Pulmonary:     Effort: Pulmonary effort is normal. No respiratory distress.     Breath sounds: Normal breath sounds. No wheezing.  Abdominal:     General: Bowel sounds are normal. There is no distension.     Palpations: Abdomen is soft.     Tenderness: There is no abdominal tenderness.  Musculoskeletal: Normal range of motion.        General: No tenderness.  Skin:    General: Skin is warm and dry.     Findings: No erythema or rash.     Comments: Right lateral forearm 2X2cm discoloration , flat area. No warmth, redness, swelling, or tenderness present.  Neurological:     Mental Status: He is alert and oriented to person, place, and time.     Cranial Nerves: No cranial nerve deficit.     Deep Tendon Reflexes: Reflexes are normal and symmetric.  Psychiatric:        Behavior: Behavior normal.        Thought  Content: Thought content normal.        Judgment: Judgment normal.     BP (!) 146/80   Pulse 62   Temp (!) 97.4 F (36.3 C) (Oral)   Ht 5\' 10"  (1.778 m)   Wt 172 lb (78 kg)   BMI 24.68 kg/m        Assessment & Plan:  Isaac [name] comes in today with chief complaint of lump with soreness right arm   Diagnosis and orders addressed:  1. Abscess  2. Cellulitis of right upper extremity  3. Hospital discharge follow-up  Greatly improved!! Complete doxycyline  Do not pick or squeeze If area becomes red, swollen, or tender call office  Evelina Dun, FNP

## 2018-10-27 IMAGING — DX DG CHEST 2V
2 series · 2 of 2 positions shown · non-contrast
Comparison: 04/15/2016

CLINICAL DATA: Shortness of breath on exertion for several months

EXAM:
CHEST  2 VIEW

[chest pa]
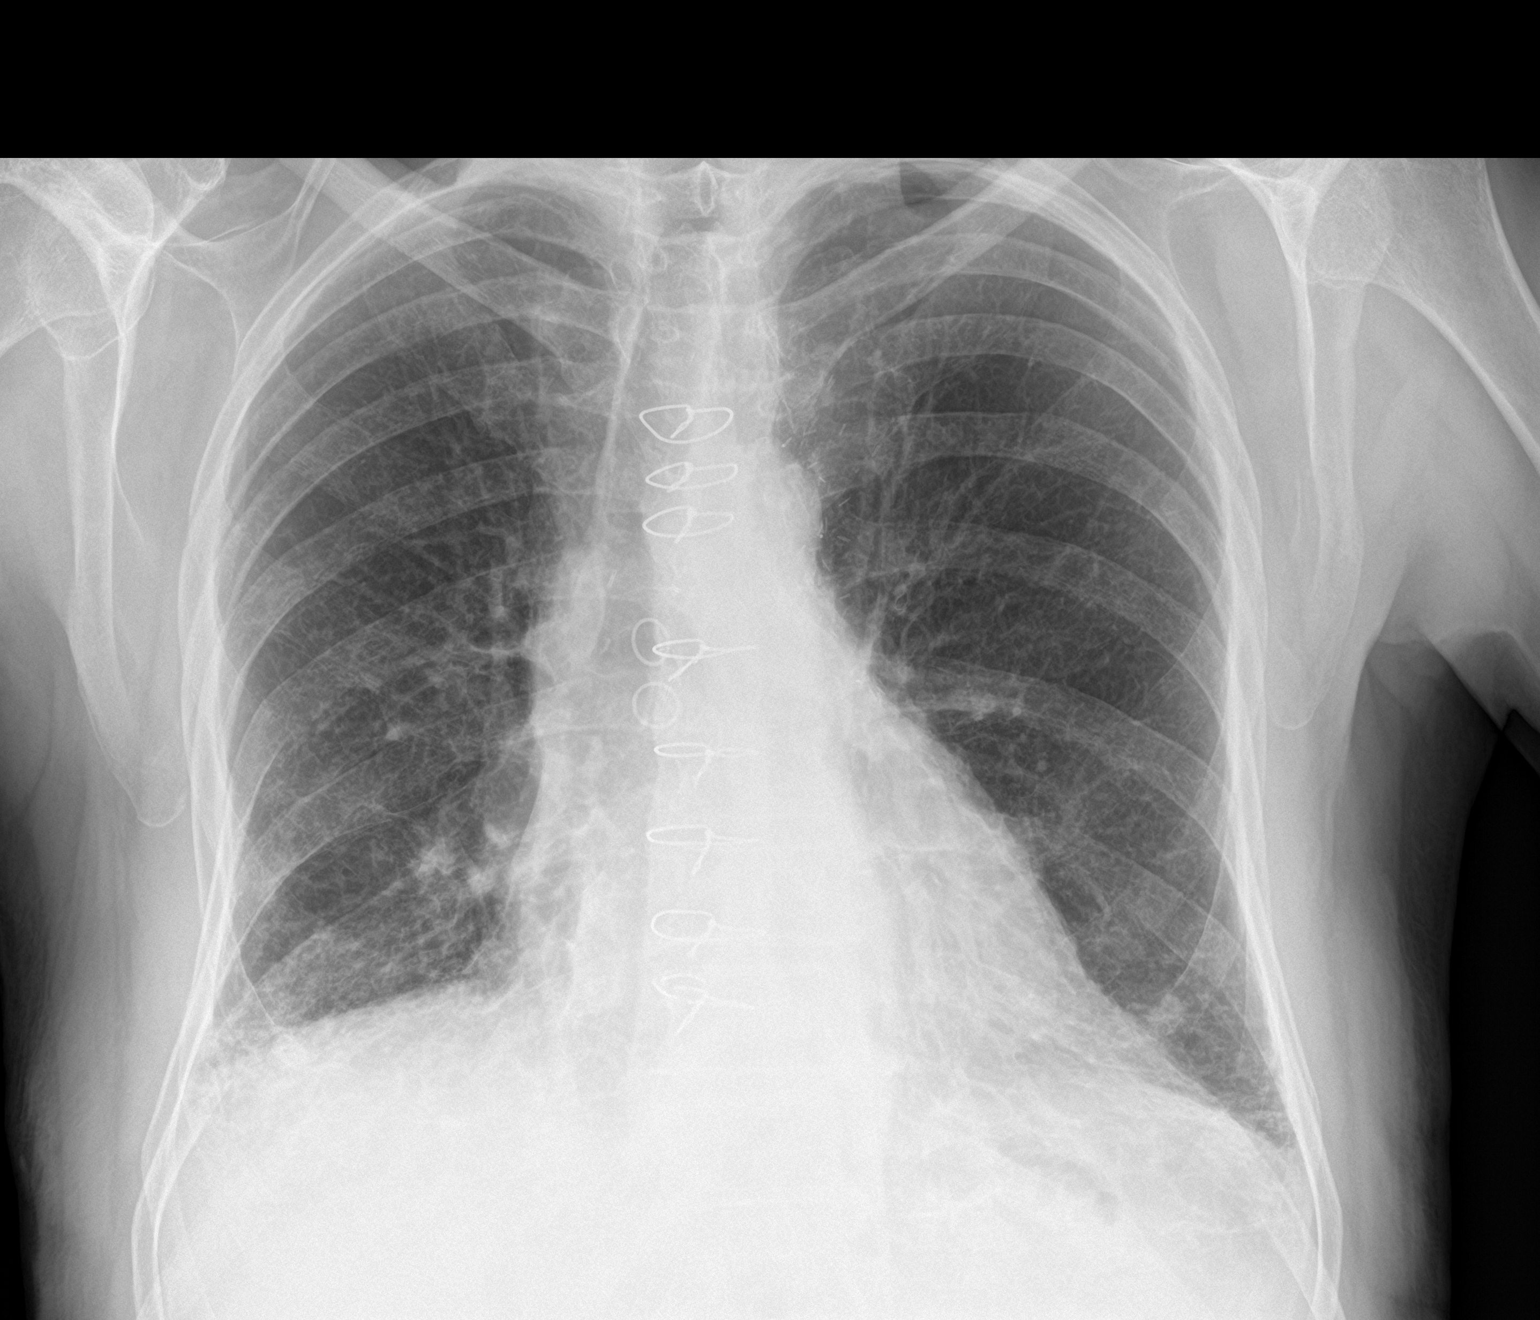

[chest lat]
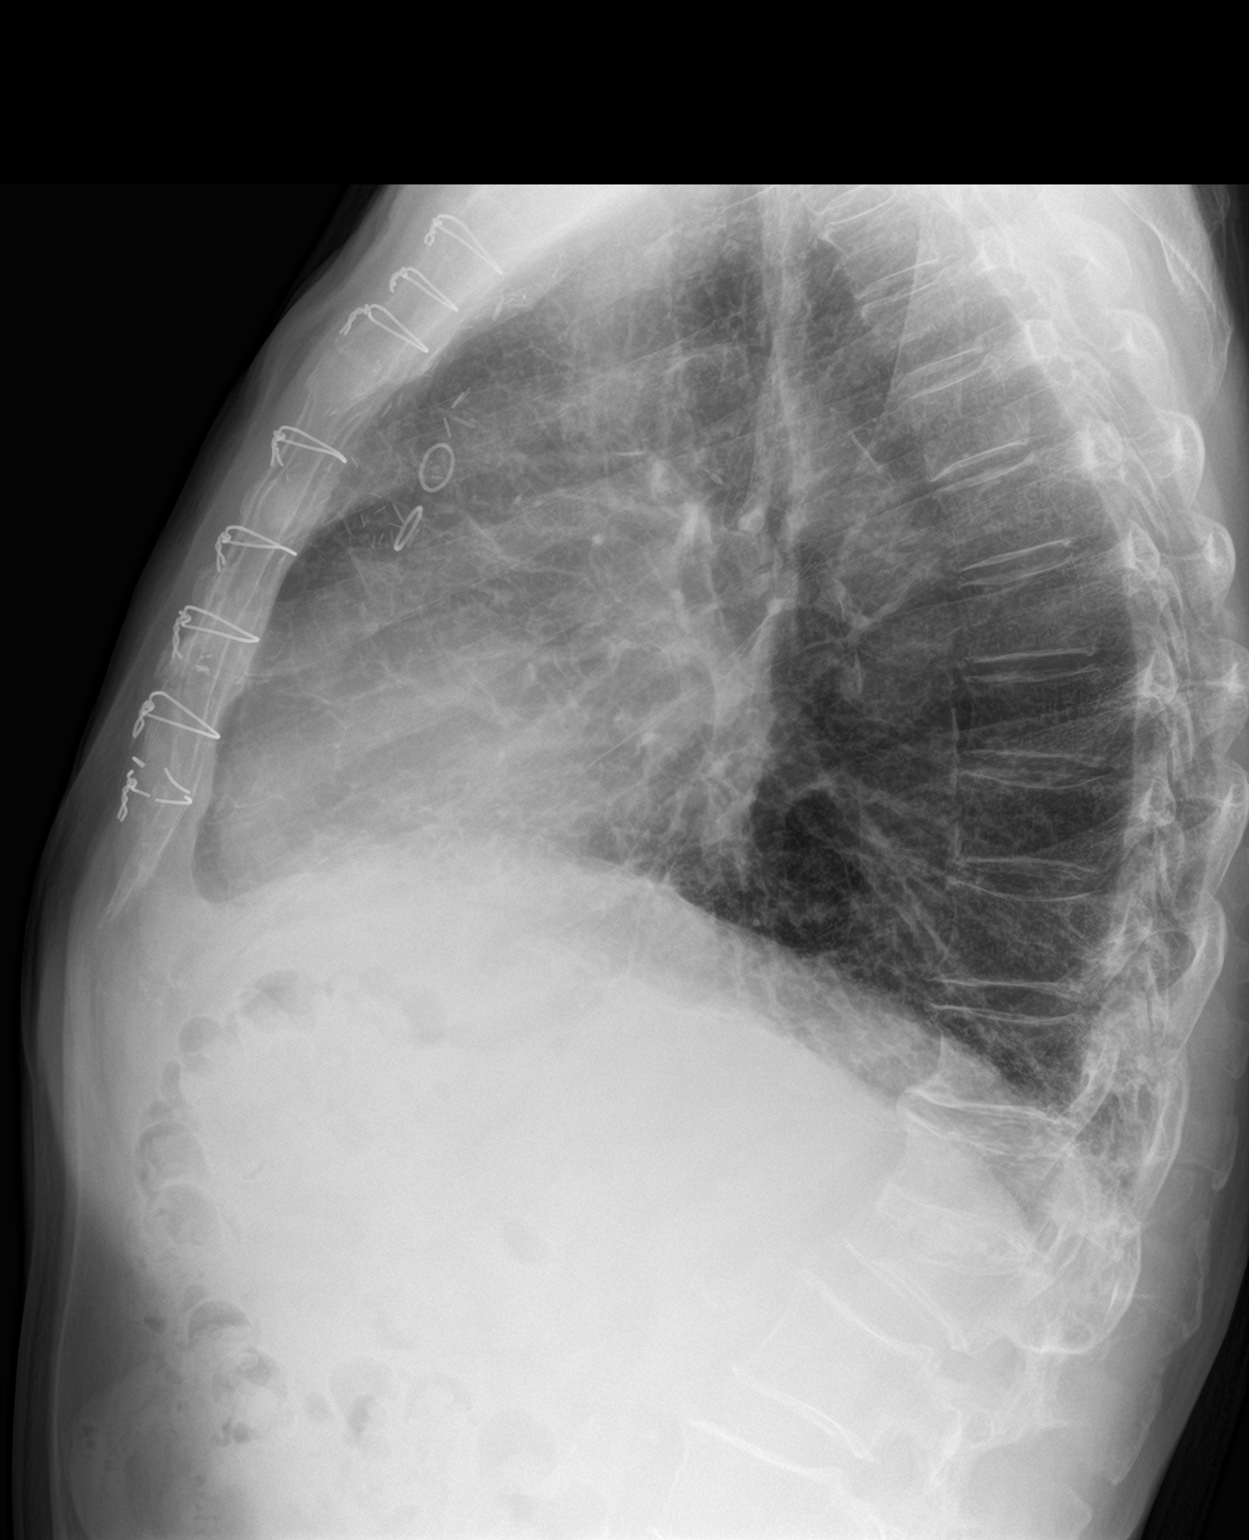

[2 of 2 positions shown; findings below may reference images not displayed]

FINDINGS: Cardiac shadow is at the upper limits of normal in size but stable.
Changes consistent prior coronary bypass grafting are noted. Some
fibrotic changes are again seen in the bases primarily. No focal
infiltrate or sizable effusion is seen. Chronic changes in the lower
thoracic spine are noted.
IMPRESSION: Chronic changes without acute abnormality.

## 2018-12-20 ENCOUNTER — Other Ambulatory Visit: Payer: Self-pay | Admitting: Family Medicine

## 2019-02-02 ENCOUNTER — Encounter: Payer: Self-pay | Admitting: Internal Medicine

## 2019-02-02 ENCOUNTER — Other Ambulatory Visit: Payer: Self-pay

## 2019-02-02 ENCOUNTER — Ambulatory Visit: Payer: Medicare Other | Admitting: Internal Medicine

## 2019-02-02 VITALS — BP 152/76 | HR 68 | Ht 70.0 in | Wt 169.0 lb

## 2019-02-02 DIAGNOSIS — R0609 Other forms of dyspnea: Secondary | ICD-10-CM

## 2019-02-02 DIAGNOSIS — I2510 Atherosclerotic heart disease of native coronary artery without angina pectoris: Secondary | ICD-10-CM | POA: Diagnosis not present

## 2019-02-02 DIAGNOSIS — I5042 Chronic combined systolic (congestive) and diastolic (congestive) heart failure: Secondary | ICD-10-CM | POA: Diagnosis not present

## 2019-02-02 DIAGNOSIS — I110 Hypertensive heart disease with heart failure: Secondary | ICD-10-CM

## 2019-02-02 DIAGNOSIS — R0600 Dyspnea, unspecified: Secondary | ICD-10-CM | POA: Diagnosis not present

## 2019-02-02 NOTE — Progress Notes (Signed)
HPI [name] returns today for followup. He is a pleasant 78 yo man with CAD, s/p CABG 15 years ago, HTN, dyslipidemia, and underlying lung disease. He has tried to increase his activity. No edema. No chest pain. He denies edema. No syncope.  No Known Allergies   Current Outpatient Medications  Medication Sig Dispense Refill  . aspirin (ADULT ASPIRIN EC LOW STRENGTH) 81 MG EC tablet Take 81 mg by mouth every morning.     . Cholecalciferol (VITAMIN D) 2000 UNITS tablet Take 2,000 Units by mouth daily.     [name] docusate sodium (COLACE) 100 MG capsule Take 1 capsule by mouth every other day    . doxazosin (CARDURA) 4 MG tablet Take 4 mg by mouth 2 (two) times daily.    . fluticasone (FLONASE) 50 MCG/ACT nasal spray Place 2 sprays into both nostrils daily as needed for allergies or rhinitis.     . furosemide (LASIX) 40 MG tablet Take 1 tablet (40 mg total) by mouth daily as needed for fluid or edema. 30 tablet 5  . metoprolol succinate (TOPROL-XL) 50 MG 24 hr tablet TAKE 1 TABLET BY MOUTH IN THE MORNING AND 1/2 TABLET IN THE EVENING 45 tablet 0  . Multiple Vitamin (MULTIVITAMIN WITH MINERALS) TABS Take 1 tablet by mouth daily.     . multivitamin-lutein (OCUVITE-LUTEIN) CAPS capsule Take 1 capsule by mouth daily.    . nitroGLYCERIN (NITROSTAT) 0.4 MG SL tablet Place 1 tablet (0.4 mg total) under the tongue every 5 (five) minutes as needed for chest pain (MAX 3 TABLETS). 10 tablet 6  . omeprazole (PRILOSEC) 20 MG capsule Take 1 capsule (20 mg total) by mouth 2 (two) times daily. 180 capsule 3  . oxymetazoline (AFRIN) 0.05 % nasal spray Place 2 sprays into the nose every 12 (twelve) hours as needed for congestion (x3 days). Dryness    . potassium chloride SA (KLOR-CON M20) 20 MEQ tablet TAKE ONE TABLET BY MOUTH ONCE DAILY WHEN  YOU  TAKE LASIX 30 tablet 5  . Simethicone (GAS-X EXTRA STRENGTH) 125 MG CAPS Take by mouth as directed. Reported on 10/10/2015 one after each meal    . simvastatin  (ZOCOR) 20 MG tablet Take 1 tablet (20 mg total) by mouth at bedtime. 90 tablet 3   No current facility-administered medications for this visit.      Past Medical History:  Diagnosis Date  . Allergic rhinitis   . Allergy   . Arthritis 06/04/2014   Trial of sulindac 05/30/14 since cri risk with other nsaids   . Barrett's esophagus 10/27/2011  . BPH (benign prostatic hyperplasia) 02/20/2014  . CAD (coronary artery disease) of artery bypass graft, Occluded VG-non dominant LCX medical therapy 11/25/2013  . CAD (coronary artery disease), native coronary artery - LIMA-LAD, SVG-Diag, SVG-RCA 2002    Catheterization 2002 showing occluded LAD, 99% diagonal, occluded distal circumflex, 90% nondominant right coronary artery  CABG 08/06/00 by Dr. Nils Pyle with LIMA to LAD, SVG to diagonal, SVG to right coronary artery, circumflex was noted to be too small to graft    . CAP (community acquired pneumonia) 04/16/2016   See cxr 04/15/16 > treated with 7 days of Levaquin 500 mg daily with resolution radiographically and clinically.  . Cataract   . Cerumen impaction 06/05/2015  . CHF (congestive heart failure), NYHA class III ([place]) 02/20/2014  . Dyspnea    with exertion   . Dyspnea on exertion 11/08/2013   Followed as Primary Care  Patient/ [name]  - new onset early June 2015  - 11/08/2013  Walked RA x 3 laps @ 185 ft each stopped due to  End of study, mild sob, no cp and no desat, EKG ok  - Cardiac w/u inconclusive but not clearly having ischemia  11/24/13   - 09/11/2016  Walked RA x 3 laps @ 185 ft each stopped due to  End of study, nl pace, no sob or desat   - Spirometry 09/11/2016    . Essential hypertension    Referred to Int med [facility] 12/12/2016   . GERD (gastroesophageal reflux disease)   . H/O hiatal hernia   . History of skin cancer    bilateral arms with removal  . Hyperlipidemia   . Hyperlipidemia LDL goal <70    Followed as Primary Care Patient/ [facility]/ [name]     - Target  LDL < 70 as has IHD    . Hypertension   . Hypertensive heart disease    Followed as Primary Care Patient/ [name]     . Ischemic heart disease   . Left femoral hernia s/p laparoscopic repair 05/28/2017 05/28/2017  . Lumbar disc disease   . Myocardial infarction ([place]) 2002  . Right inguinal hernia s/p laparoscopic repair 05/28/2017 05/28/2017  . Stage 3 chronic kidney disease 05/22/2015   trial off lasix/ppi/clinoril / [place] and renal eval 05/22/2015 >>>    . T wave inversion in EKG 02/20/2014  . Unstable angina ([place]) 11/23/2013    ROS:   All systems reviewed and negative except as noted in the HPI.   Past Surgical History:  Procedure Laterality Date  . COLONOSCOPY    . CORONARY ARTERY BYPASS GRAFT  2002   LIMA-LAD, SVG-Diag, SVG-RCA  . EYE SURGERY Bilateral 1995, 2000   ioc for cataracts  . GROIN DISSECTION  01/23/2012   Procedure: Virl Son EXPLORATION;  Surgeon: Harl Bowie, MD;  Location: [facility];  Service: General;  Laterality: Left;  Left Inguinal Exploration, release of scar tissue, Placement of Mesh  . INGUINAL HERNIA REPAIR Left 1983  . INGUINAL HERNIA REPAIR Bilateral 05/28/2017   Procedure: LAPAROSCOPIC RIGHT AND LEFT INGUINAL HERNIA REPAIR WITH MESH;  Surgeon: Michael Boston, MD;  Location: [place];  Service: General;  Laterality: Bilateral;  . INSERTION OF MESH Bilateral 05/28/2017   Procedure: INSERTION OF MESH;  Surgeon: Michael Boston, MD;  Location: [place];  Service: General;  Laterality: Bilateral;  . LEFT HEART CATHETERIZATION WITH CORONARY ANGIOGRAM N/A 11/24/2013   Procedure: LEFT HEART CATHETERIZATION WITH CORONARY ANGIOGRAM;  Surgeon: Peter M Martinique, MD;  Location: [facility] CATH LAB;  Service: Cardiovascular;  Laterality: N/A;  . NOSE SURGERY  1985     Family History  Problem Relation Age of Onset  . Breast cancer Mother   . Diabetes Mother   . Hypertension Father   . Dementia Father 77       early 80s  .  Diabetes Sister   . COPD Brother   . Colon cancer Neg Hx   . Esophageal cancer Neg Hx   . Rectal cancer Neg Hx   . Stomach cancer Neg Hx      Social History   Socioeconomic History  . Marital status: Married    Spouse name: [name]  . Number of children: 0  . Years of education: Not on file  . Highest education level: 11th grade  Occupational History  . Occupation: Retired  Employer: [employer]  Social Needs  . Financial resource strain: Not very hard  . Food insecurity    Worry: Never true    Inability: Never true  . Transportation needs    Medical: No    Non-medical: No  Tobacco Use  . Smoking status: Never Smoker  . Smokeless tobacco: Former Systems developer    Types: Chew  Substance and Sexual Activity  . Alcohol use: No    Alcohol/week: 0.0 standard drinks  . Drug use: No  . Sexual activity: Never  Lifestyle  . Physical activity    Days per week: 0 days    Minutes per session: 0 min  . Stress: Not at all  Relationships  . Social connections    Talks on phone: More than three times a week    Gets together: More than three times a week    Attends religious service: More than 4 times per year    Active member of club or organization: No    Attends meetings of clubs or organizations: Never    Relationship status: Married  . Intimate partner violence    Fear of current or ex partner: No    Emotionally abused: No    Physically abused: No    Forced sexual activity: No  Other Topics Concern  . Not on file  Social History Narrative   Remarried in 08/12/03 after first wife died of emphysema in 08/11/2001. 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 He lives in a one story home with his wife. They raising 2 great grandchildren. One is 59 months old and the other is 78 years old. He has no biological children.      BP (!) 152/76   Pulse 68   Ht 5\' 10"  (1.778 m)   Wt 169 lb (76.7  kg)   SpO2 93%   BMI 24.25 kg/m   Physical Exam:  Well appearing NAD HEENT: Unremarkable Neck:  No JVD, no thyromegally Lymphatics:  No adenopathy Back:  No CVA tenderness Lungs:  Clear with no wheezes HEART:  Regular rate rhythm, no murmurs, no rubs, no clicks Abd:  soft, positive bowel sounds, no organomegally, no rebound, no guarding Ext:  2 plus pulses, no edema, no cyanosis, no clubbing Skin:  No rashes no nodules Neuro:  CN II through XII intact, motor grossly intact   Assess/Plan: 1. CAD - he denies anginal symptoms. No change in meds. 2.dyspnea - he still has class 2 symptoms. He is encouraged to maintain a low sodium diet. 3. HTN - his SBP remains a little high. He is encouraged to maintain a low sodium diet.   Mikle Bosworth.D.

## 2019-02-02 NOTE — Patient Instructions (Signed)

## 2019-02-04 ENCOUNTER — Other Ambulatory Visit: Payer: Self-pay | Admitting: Family Medicine

## 2019-02-07 NOTE — Telephone Encounter (Signed)
LM to call for appt

## 2019-02-07 NOTE — Telephone Encounter (Signed)
Dettinger. NTBS 30 days given 12/21/18

## 2019-02-08 ENCOUNTER — Ambulatory Visit (INDEPENDENT_AMBULATORY_CARE_PROVIDER_SITE_OTHER): Payer: Medicare Other | Admitting: Family Medicine

## 2019-02-08 ENCOUNTER — Other Ambulatory Visit: Payer: Self-pay

## 2019-02-08 ENCOUNTER — Encounter: Payer: Self-pay | Admitting: Family Medicine

## 2019-02-08 DIAGNOSIS — E785 Hyperlipidemia, unspecified: Secondary | ICD-10-CM | POA: Diagnosis not present

## 2019-02-08 DIAGNOSIS — I10 Essential (primary) hypertension: Secondary | ICD-10-CM | POA: Diagnosis not present

## 2019-02-08 DIAGNOSIS — I5042 Chronic combined systolic (congestive) and diastolic (congestive) heart failure: Secondary | ICD-10-CM

## 2019-02-08 DIAGNOSIS — I110 Hypertensive heart disease with heart failure: Secondary | ICD-10-CM

## 2019-02-08 DIAGNOSIS — K219 Gastro-esophageal reflux disease without esophagitis: Secondary | ICD-10-CM

## 2019-02-08 DIAGNOSIS — N1832 Chronic kidney disease, stage 3b: Secondary | ICD-10-CM

## 2019-02-08 MED ORDER — METOPROLOL SUCCINATE ER 50 MG PO TB24: ORAL_TABLET | ORAL | 3 refills | Status: DC

## 2019-02-08 MED ORDER — DOXAZOSIN MESYLATE 1 MG PO TABS: 1.0000 mg | ORAL_TABLET | Freq: Two times a day (BID) | ORAL | 3 refills | Status: DC

## 2019-02-08 MED ORDER — SIMVASTATIN 20 MG PO TABS: 20.0000 mg | ORAL_TABLET | Freq: Every day | ORAL | 3 refills | Status: DC

## 2019-02-08 NOTE — Progress Notes (Signed)
Virtual Visit via telephone Note  I connected with Isaac [name] on 02/08/19 at 1035 by telephone and verified that I am speaking with the correct person using two identifiers. Isaac [name] is currently located at home and wife  are currently with her during visit. The provider, Fransisca Kaufmann , MD is located in their office at time of visit.  Call ended at 1050  I discussed the limitations, risks, security and privacy concerns of performing an evaluation and management service by telephone and the availability of in person appointments. I also discussed with the patient that there may be a patient responsible charge related to this service. The patient expressed understanding and agreed to proceed.   History and Present Illness: Hypertension and chf Patient is currently on metoprolol and lasix prn, and their blood pressure today is 152/81. Patient denies any lightheadedness or dizziness. Patient denies headaches, blurred vision, chest pains, shortness of breath, or weakness. Denies any side effects from medication and is content with current medication.   Hyperlipidemia Patient is coming in for recheck of his hyperlipidemia. The patient is currently taking simvastatin. They deny any issues with myalgias or history of liver damage from it. They deny any focal numbness or weakness or chest pain.   GERD Patient is currently on omeprazole.  She denies any major symptoms or abdominal pain or belching or burping. She denies any blood in her stool or lightheadedness or dizziness.   No diagnosis [name].  Outpatient Encounter Medications as of 02/08/2019  Medication Sig  . aspirin (ADULT ASPIRIN EC LOW STRENGTH) 81 MG EC tablet Take 81 mg by mouth every morning.   . Cholecalciferol (VITAMIN D) 2000 UNITS tablet Take 2,000 Units by mouth daily.   [name] docusate sodium (COLACE) 100 MG capsule Take 1 capsule by mouth every other day  . doxazosin (CARDURA) 4 MG tablet Take 4 mg by mouth 2 (two)  times daily.  . fluticasone (FLONASE) 50 MCG/ACT nasal spray Place 2 sprays into both nostrils daily as needed for allergies or rhinitis.   . furosemide (LASIX) 40 MG tablet Take 1 tablet (40 mg total) by mouth daily as needed for fluid or edema.  . metoprolol succinate (TOPROL-XL) 50 MG 24 hr tablet TAKE 1 TABLET BY MOUTH IN THE MORNING AND 1/2 TABLET IN THE EVENING  . Multiple Vitamin (MULTIVITAMIN WITH MINERALS) TABS Take 1 tablet by mouth daily.   . multivitamin-lutein (OCUVITE-LUTEIN) CAPS capsule Take 1 capsule by mouth daily.  . nitroGLYCERIN (NITROSTAT) 0.4 MG SL tablet Place 1 tablet (0.4 mg total) under the tongue every 5 (five) minutes as needed for chest pain (MAX 3 TABLETS).  [name] omeprazole (PRILOSEC) 20 MG capsule Take 1 capsule (20 mg total) by mouth 2 (two) times daily.  [name] oxymetazoline (AFRIN) 0.05 % nasal spray Place 2 sprays into the nose every 12 (twelve) hours as needed for congestion (x3 days). Dryness  . potassium chloride SA (KLOR-CON M20) 20 MEQ tablet TAKE ONE TABLET BY MOUTH ONCE DAILY WHEN  YOU  TAKE LASIX  . Simethicone (GAS-X EXTRA STRENGTH) 125 MG CAPS Take by mouth as directed. Reported on 10/10/2015 one after each meal  . simvastatin (ZOCOR) 20 MG tablet Take 1 tablet (20 mg total) by mouth at bedtime.   No facility-administered encounter medications on file as of 02/08/2019.     Review of Systems  Constitutional: Negative for chills and fever.  HENT: Positive for voice change (Patient has had continued hoarseness with but it is  improving).   Eyes: Negative for visual disturbance.  Respiratory: Negative for shortness of breath and wheezing.   Cardiovascular: Negative for chest pain and leg swelling.  Gastrointestinal: Negative for abdominal pain.  Musculoskeletal: Negative for back pain and gait problem.  Skin: Negative for rash.  Neurological: Negative for dizziness, weakness and light-headedness.  All other systems reviewed and are negative.    Observations/Objective: Patient sounds comfortable and in no acute distress  Assessment and Plan: Problem List Items Addressed This Visit      Cardiovascular and Mediastinum   Hypertensive heart disease (Chronic)   Relevant Medications   metoprolol succinate (TOPROL-XL) 50 MG 24 hr tablet   simvastatin (ZOCOR) 20 MG tablet   doxazosin (CARDURA) 1 MG tablet   Other Relevant Orders   CMP14+EGFR   Essential hypertension   Relevant Medications   metoprolol succinate (TOPROL-XL) 50 MG 24 hr tablet   simvastatin (ZOCOR) 20 MG tablet   doxazosin (CARDURA) 1 MG tablet   Other Relevant Orders   CMP14+EGFR     Digestive   GERD (gastroesophageal reflux disease) (Chronic)   Relevant Orders   CBC with Differential/Platelet     Genitourinary   Stage 3 chronic kidney disease - Primary   Relevant Orders   CBC with Differential/Platelet   CMP14+EGFR     Other   Hyperlipidemia LDL goal <70 (Chronic)   Relevant Medications   metoprolol succinate (TOPROL-XL) 50 MG 24 hr tablet   simvastatin (ZOCOR) 20 MG tablet   doxazosin (CARDURA) 1 MG tablet   Other Relevant Orders   Lipid panel       Follow Up Instructions: No change in medication, patient was already taking doxazosin 1 mg twice daily and simvastatin 20 mg and metoprolol 51-1/2 daily continue these    I discussed the assessment and treatment plan with the patient. The patient was provided an opportunity to ask questions and all were answered. The patient agreed with the plan and demonstrated an understanding of the instructions.   The patient was advised to call back or seek an in-person evaluation if the symptoms worsen or if the condition fails to improve as anticipated.  The above assessment and management plan was discussed with the patient. The patient verbalized understanding of and has agreed to the management plan. Patient is aware to call the clinic if symptoms persist or worsen. Patient is aware when to return to the  clinic for a follow-up visit. Patient educated on when it is appropriate to go to the emergency department.    I provided 15 minutes of non-face-to-face time during this encounter.    Worthy Rancher, MD

## 2019-02-17 ENCOUNTER — Other Ambulatory Visit: Payer: Self-pay | Admitting: *Deleted

## 2019-02-17 ENCOUNTER — Other Ambulatory Visit: Payer: Self-pay | Admitting: Internal Medicine

## 2019-02-17 DIAGNOSIS — Z20822 Contact with and (suspected) exposure to covid-19: Secondary | ICD-10-CM

## 2019-02-18 LAB — NOVEL CORONAVIRUS, NAA: SARS-CoV-2, NAA: NOT DETECTED

## 2019-02-22 LAB — NOVEL CORONAVIRUS, NAA: SARS-CoV-2, NAA: NOT DETECTED

## 2019-02-24 ENCOUNTER — Telehealth: Payer: Self-pay | Admitting: General Practice

## 2019-02-24 NOTE — Telephone Encounter (Signed)
Gave wife of patient negative covid test Wife understood

## 2019-03-14 ENCOUNTER — Other Ambulatory Visit: Payer: Medicare Other

## 2019-03-14 ENCOUNTER — Other Ambulatory Visit: Payer: Self-pay

## 2019-03-14 ENCOUNTER — Ambulatory Visit (INDEPENDENT_AMBULATORY_CARE_PROVIDER_SITE_OTHER): Payer: Medicare Other

## 2019-03-14 DIAGNOSIS — I10 Essential (primary) hypertension: Secondary | ICD-10-CM

## 2019-03-14 DIAGNOSIS — Z23 Encounter for immunization: Secondary | ICD-10-CM

## 2019-03-14 DIAGNOSIS — E785 Hyperlipidemia, unspecified: Secondary | ICD-10-CM

## 2019-03-15 LAB — CMP14+EGFR
ALT: 11 IU/L (ref 0–44)
AST: 16 IU/L (ref 0–40)
Albumin/Globulin Ratio: 1.3 (ref 1.2–2.2)
Albumin: 4.1 g/dL (ref 3.7–4.7)
Alkaline Phosphatase: 81 IU/L (ref 39–117)
BUN/Creatinine Ratio: 12 (ref 10–24)
BUN: 17 mg/dL (ref 8–27)
Bilirubin Total: 0.4 mg/dL (ref 0.0–1.2)
CO2: 22 mmol/L (ref 20–29)
Calcium: 9.1 mg/dL (ref 8.6–10.2)
Chloride: 103 mmol/L (ref 96–106)
Creatinine, Ser: 1.42 mg/dL — ABNORMAL HIGH (ref 0.76–1.27)
GFR calc Af Amer: 54 mL/min/{1.73_m2} — ABNORMAL LOW (ref >59)
GFR calc non Af Amer: 47 mL/min/{1.73_m2} — ABNORMAL LOW (ref >59)
Globulin, Total: 3.1 g/dL (ref 1.5–4.5)
Glucose: 99 mg/dL (ref 65–99)
Potassium: 4.3 mmol/L (ref 3.5–5.2)
Sodium: 144 mmol/L (ref 134–144)
Total Protein: 7.2 g/dL (ref 6.0–8.5)

## 2019-03-15 LAB — CBC WITH DIFFERENTIAL/PLATELET
Basophils Absolute: 0.1 10*3/uL (ref 0.0–0.2)
Basos: 1 %
EOS (ABSOLUTE): 0.4 10*3/uL (ref 0.0–0.4)
Eos: 4 %
Hematocrit: 38.7 % (ref 37.5–51.0)
Hemoglobin: 13.1 g/dL (ref 13.0–17.7)
Immature Grans (Abs): 0 10*3/uL (ref 0.0–0.1)
Immature Granulocytes: 0 %
Lymphocytes Absolute: 2.4 10*3/uL (ref 0.7–3.1)
Lymphs: 28 %
MCH: 31.5 pg (ref 26.6–33.0)
MCHC: 33.9 g/dL (ref 31.5–35.7)
MCV: 93 fL (ref 79–97)
Monocytes Absolute: 0.8 10*3/uL (ref 0.1–0.9)
Monocytes: 9 %
Neutrophils Absolute: 5 10*3/uL (ref 1.4–7.0)
Neutrophils: 58 %
Platelets: 156 10*3/uL (ref 150–450)
RBC: 4.16 x10E6/uL (ref 4.14–5.80)
RDW: 13.3 % (ref 11.6–15.4)
WBC: 8.6 10*3/uL (ref 3.4–10.8)

## 2019-03-15 LAB — LIPID PANEL
Chol/HDL Ratio: 3.4 ratio (ref 0.0–5.0)
Cholesterol, Total: 175 mg/dL (ref 100–199)
HDL: 51 mg/dL (ref >39)
LDL Chol Calc (NIH): 89 mg/dL (ref 0–99)
Triglycerides: 205 mg/dL — ABNORMAL HIGH (ref 0–149)
VLDL Cholesterol Cal: 35 mg/dL (ref 5–40)

## 2019-05-20 ENCOUNTER — Other Ambulatory Visit: Payer: Self-pay

## 2019-05-20 ENCOUNTER — Emergency Department (HOSPITAL_COMMUNITY): Payer: Medicare PPO

## 2019-05-20 ENCOUNTER — Encounter: Payer: Self-pay | Admitting: Family Medicine

## 2019-05-20 ENCOUNTER — Ambulatory Visit (INDEPENDENT_AMBULATORY_CARE_PROVIDER_SITE_OTHER): Payer: Medicare PPO | Admitting: Family Medicine

## 2019-05-20 ENCOUNTER — Encounter (HOSPITAL_COMMUNITY): Payer: Self-pay

## 2019-05-20 ENCOUNTER — Emergency Department (HOSPITAL_COMMUNITY)
Admission: EM | Admit: 2019-05-20 | Discharge: 2019-05-20 | Disposition: A | Discharge status: Home / Self Care | Payer: Medicare PPO | Attending: Emergency Medicine | Admitting: Emergency Medicine

## 2019-05-20 DIAGNOSIS — N183 Chronic kidney disease, stage 3 unspecified: Secondary | ICD-10-CM | POA: Diagnosis not present

## 2019-05-20 DIAGNOSIS — R0602 Shortness of breath: Secondary | ICD-10-CM

## 2019-05-20 DIAGNOSIS — U071 COVID-19: Secondary | ICD-10-CM | POA: Insufficient documentation

## 2019-05-20 DIAGNOSIS — R5383 Other fatigue: Secondary | ICD-10-CM

## 2019-05-20 DIAGNOSIS — R918 Other nonspecific abnormal finding of lung field: Secondary | ICD-10-CM | POA: Diagnosis not present

## 2019-05-20 DIAGNOSIS — Z79899 Other long term (current) drug therapy: Secondary | ICD-10-CM | POA: Diagnosis not present

## 2019-05-20 DIAGNOSIS — Z951 Presence of aortocoronary bypass graft: Secondary | ICD-10-CM | POA: Insufficient documentation

## 2019-05-20 DIAGNOSIS — R634 Abnormal weight loss: Secondary | ICD-10-CM

## 2019-05-20 DIAGNOSIS — R34 Anuria and oliguria: Secondary | ICD-10-CM | POA: Diagnosis not present

## 2019-05-20 DIAGNOSIS — I2510 Atherosclerotic heart disease of native coronary artery without angina pectoris: Secondary | ICD-10-CM | POA: Diagnosis not present

## 2019-05-20 DIAGNOSIS — I509 Heart failure, unspecified: Secondary | ICD-10-CM | POA: Diagnosis not present

## 2019-05-20 DIAGNOSIS — I130 Hypertensive heart and chronic kidney disease with heart failure and stage 1 through stage 4 chronic kidney disease, or unspecified chronic kidney disease: Secondary | ICD-10-CM | POA: Diagnosis not present

## 2019-05-20 LAB — CBC WITH DIFFERENTIAL/PLATELET
Abs Immature Granulocytes: 0.05 10*3/uL (ref 0.00–0.07)
Basophils Absolute: 0 10*3/uL (ref 0.0–0.1)
Basophils Relative: 0 %
Eosinophils Absolute: 0.1 10*3/uL (ref 0.0–0.5)
Eosinophils Relative: 1 %
HCT: 42.2 % (ref 39.0–52.0)
Hemoglobin: 14 g/dL (ref 13.0–17.0)
Immature Granulocytes: 1 %
Lymphocytes Relative: 15 %
Lymphs Abs: 1 10*3/uL (ref 0.7–4.0)
MCH: 31.3 pg (ref 26.0–34.0)
MCHC: 33.2 g/dL (ref 30.0–36.0)
MCV: 94.2 fL (ref 80.0–100.0)
Monocytes Absolute: 0.7 10*3/uL (ref 0.1–1.0)
Monocytes Relative: 10 %
Neutro Abs: 5.3 10*3/uL (ref 1.7–7.7)
Neutrophils Relative %: 73 %
Platelets: 144 10*3/uL — ABNORMAL LOW (ref 150–400)
RBC: 4.48 MIL/uL (ref 4.22–5.81)
RDW: 13 % (ref 11.5–15.5)
WBC: 7.1 10*3/uL (ref 4.0–10.5)
nRBC: 0 % (ref 0.0–0.2)

## 2019-05-20 LAB — BRAIN NATRIURETIC PEPTIDE: B Natriuretic Peptide: 345.5 pg/mL — ABNORMAL HIGH (ref 0.0–100.0)

## 2019-05-20 LAB — BASIC METABOLIC PANEL
Anion gap: 12 (ref 5–15)
BUN: 29 mg/dL — ABNORMAL HIGH (ref 8–23)
CO2: 25 mmol/L (ref 22–32)
Calcium: 8.9 mg/dL (ref 8.9–10.3)
Chloride: 104 mmol/L (ref 98–111)
Creatinine, Ser: 1.71 mg/dL — ABNORMAL HIGH (ref 0.61–1.24)
GFR calc Af Amer: 43 mL/min — ABNORMAL LOW (ref >60)
GFR calc non Af Amer: 38 mL/min — ABNORMAL LOW (ref >60)
Glucose, Bld: 129 mg/dL — ABNORMAL HIGH (ref 70–99)
Potassium: 3.9 mmol/L (ref 3.5–5.1)
Sodium: 141 mmol/L (ref 135–145)

## 2019-05-20 LAB — TROPONIN I (HIGH SENSITIVITY)
Troponin I (High Sensitivity): 18 ng/L — ABNORMAL HIGH (ref <18)
Troponin I (High Sensitivity): 18 ng/L — ABNORMAL HIGH (ref <18)

## 2019-05-20 LAB — RESPIRATORY PANEL BY RT PCR (FLU A&B, COVID)
Influenza A by PCR: NEGATIVE
Influenza B by PCR: NEGATIVE
SARS Coronavirus 2 by RT PCR: POSITIVE — AB

## 2019-05-20 MED ORDER — ACETAMINOPHEN 325 MG PO TABS
650.0000 mg | ORAL_TABLET | Freq: Once | ORAL | Status: AC
  Administered 2019-05-20: 650 mg via ORAL
  Filled 2019-05-20: qty 2

## 2019-05-20 MED ORDER — DEXAMETHASONE SODIUM PHOSPHATE 10 MG/ML IJ SOLN
8.0000 mg | Freq: Once | INTRAMUSCULAR | Status: AC
  Administered 2019-05-20: 8 mg via INTRAVENOUS
  Filled 2019-05-20: qty 1

## 2019-05-20 MED ORDER — SODIUM CHLORIDE 0.9 % IV BOLUS
500.0000 mL | Freq: Once | INTRAVENOUS | Status: AC
  Administered 2019-05-20: 500 mL via INTRAVENOUS

## 2019-05-20 MED ORDER — DM-GUAIFENESIN ER 30-600 MG PO TB12: 1.0000 | ORAL_TABLET | Freq: Two times a day (BID) | ORAL | Status: DC

## 2019-05-20 NOTE — ED Provider Notes (Signed)
[facility] DEPT Provider Note   CSN: [id number] Arrival date & time: 05/20/19  1235     History Chief Complaint  Patient presents with  . Shortness of Breath  . Weakness    Isaac [name] is a 79 y.o. male with history of CAD s/p CABG, CHF, HTN who presents with SOB and weakness. He states that for the past month he has become progressively weak and SOB. He reports getting extremely SOB with minimal exertion. He had a cold last week with a runny nose and has developed a productive cough with clear/white sputum. He has had a loss of appetite as well. His wife Isaac [name] says he's been delirious at times and she's worried he's going to fall and hurt himself. He denies fever, chills, chest pain, leg swelling. He has been taking his meds as prescribed. He does not smoke. He had a virtual visit with his doctor today and was advised to come to the ED.   HPI     Past Medical History:  Diagnosis Date  . Allergic rhinitis   . Allergy   . Arthritis 06/04/2014   Trial of sulindac 05/30/14 since cri risk with other nsaids   . Barrett's esophagus 10/27/2011  . BPH (benign prostatic hyperplasia) 02/20/2014  . CAD (coronary artery disease) of artery bypass graft, Occluded VG-non dominant LCX medical therapy 11/25/2013  . CAD (coronary artery disease), native coronary artery - LIMA-LAD, SVG-Diag, SVG-RCA 2002    Catheterization 2002 showing occluded LAD, 99% diagonal, occluded distal circumflex, 90% nondominant right coronary artery  CABG 08/06/00 by Dr. Nils Pyle with LIMA to LAD, SVG to diagonal, SVG to right coronary artery, circumflex was noted to be too small to graft    . CAP (community acquired pneumonia) 04/16/2016   See cxr 04/15/16 > treated with 7 days of Levaquin 500 mg daily with resolution radiographically and clinically.  . Cataract   . Cerumen impaction 06/05/2015  . CHF (congestive heart failure), NYHA class III ([place]) 02/20/2014  . Dyspnea    with exertion   .  Dyspnea on exertion 11/08/2013   Followed as Primary Care Patient/ [facility] [name]  - new onset early June 2015  - 11/08/2013  Walked RA x 3 laps @ 185 ft each stopped due to  End of study, mild sob, no cp and no desat, EKG ok  - Cardiac w/u inconclusive but not clearly having ischemia  11/24/13   - 09/11/2016  Walked RA x 3 laps @ 185 ft each stopped due to  End of study, nl pace, no sob or desat   - Spirometry 09/11/2016    . Essential hypertension    Referred to [facility] 12/12/2016   . GERD (gastroesophageal reflux disease)   . H/O hiatal hernia   . History of skin cancer    bilateral arms with removal  . Hyperlipidemia   . Hyperlipidemia LDL goal <70    Followed as Primary Care Patient/ [facility]/ [name]     - Target LDL < 70 as has IHD    . Hypertension   . Hypertensive heart disease    Followed as Primary Care Patient/ [facility]/ [name]     . Ischemic heart disease   . Left femoral hernia s/p laparoscopic repair 05/28/2017 05/28/2017  . Lumbar disc disease   . Myocardial infarction ([place]) 2002  . Right inguinal hernia s/p laparoscopic repair 05/28/2017 05/28/2017  . Stage 3 chronic kidney disease 05/22/2015   trial  off lasix/ppi/clinoril / [place] and renal eval 05/22/2015 >>>    . T wave inversion in EKG 02/20/2014  . Unstable angina ([place]) 11/23/2013    Patient Active Problem List   Diagnosis Date Noted  . Right inguinal hernia s/p laparoscopic repair 05/28/2017 05/28/2017  . Left femoral hernia s/p laparoscopic repair 05/28/2017 05/28/2017  . Cerumen impaction 06/05/2015  . Stage 3 chronic kidney disease 05/22/2015  . Arthritis 06/04/2014  . CHF (congestive heart failure), NYHA class III ([place]) 02/20/2014  . BPH (benign prostatic hyperplasia) 02/20/2014  . T wave inversion in EKG 02/20/2014  . Coronary artery disease 11/25/2013  . Essential hypertension   . Unstable angina ([place]) 11/23/2013  . Dyspnea on exertion 11/08/2013  . Barrett's esophagus 10/27/2011  .  GERD (gastroesophageal reflux disease) 07/14/2011  . CAD (coronary artery disease), native coronary artery - LIMA-LAD, SVG-Diag, SVG-RCA 2002   . Hyperlipidemia LDL goal <70   . Hypertensive heart disease     Past Surgical History:  Procedure Laterality Date  . COLONOSCOPY    . CORONARY ARTERY BYPASS GRAFT  2002   LIMA-LAD, SVG-Diag, SVG-RCA  . EYE SURGERY Bilateral 1995, 2000   ioc for cataracts  . GROIN DISSECTION  01/23/2012   Procedure: Virl Son EXPLORATION;  Surgeon: Harl Bowie, MD;  Location: [facility];  Service: General;  Laterality: Left;  Left Inguinal Exploration, release of scar tissue, Placement of Mesh  . INGUINAL HERNIA REPAIR Left 1983  . INGUINAL HERNIA REPAIR Bilateral 05/28/2017   Procedure: LAPAROSCOPIC RIGHT AND LEFT INGUINAL HERNIA REPAIR WITH MESH;  Surgeon: Michael Boston, MD;  Location: [facility];  Service: General;  Laterality: Bilateral;  . INSERTION OF MESH Bilateral 05/28/2017   Procedure: INSERTION OF MESH;  Surgeon: Michael Boston, MD;  Location: [facility];  Service: General;  Laterality: Bilateral;  . LEFT HEART CATHETERIZATION WITH CORONARY ANGIOGRAM N/A 11/24/2013   Procedure: LEFT HEART CATHETERIZATION WITH CORONARY ANGIOGRAM;  Surgeon: Peter M Martinique, MD;  Location[facility] CATH LAB;  Service: Cardiovascular;  Laterality: N/A;  . NOSE SURGERY  1985       Family History  Problem Relation Age of Onset  . Breast cancer Mother   . Diabetes Mother   . Hypertension Father   . Dementia Father 73       early 51s  . Diabetes Sister   . COPD Brother   . Colon cancer Neg Hx   . Esophageal cancer Neg Hx   . Rectal cancer Neg Hx   . Stomach cancer Neg Hx     Social History   Tobacco Use  . Smoking status: Never Smoker  . Smokeless tobacco: Former Systems developer    Types: Chew  Substance Use Topics  . Alcohol use: No    Alcohol/week: 0.0 standard drinks  . Drug use: No    Home Medications Prior to Admission medications     Medication Sig Start Date End Date Taking? Authorizing Provider  aspirin (ADULT ASPIRIN EC LOW STRENGTH) 81 MG EC tablet Take 81 mg by mouth every morning.     [provider]  Cholecalciferol (VITAMIN D) 2000 UNITS tablet Take 2,000 Units by mouth daily.     [provider]  docusate sodium (COLACE) 100 MG capsule Take 1 capsule by mouth every other day    [provider]  doxazosin (CARDURA) 1 MG tablet Take 1 tablet (1 mg total) by mouth 2 (two) times daily. 02/08/19   Dettinger, Fransisca Kaufmann, MD  fluticasone (  FLONASE) 50 MCG/ACT nasal spray Place 2 sprays into both nostrils daily as needed for allergies or rhinitis.     [provider]  furosemide (LASIX) 40 MG tablet Take 1 tablet (40 mg total) by mouth daily as needed for fluid or edema. 02/11/18   Dettinger, Fransisca Kaufmann, MD  metoprolol succinate (TOPROL-XL) 50 MG 24 hr tablet TAKE 1 TABLET BY MOUTH IN THE MORNING AND 1/2 TABLET IN THE EVENING 02/08/19   Dettinger, Fransisca Kaufmann, MD  Multiple Vitamin (MULTIVITAMIN WITH MINERALS) TABS Take 1 tablet by mouth daily.     [provider]  multivitamin-lutein (OCUVITE-LUTEIN) CAPS capsule Take 1 capsule by mouth daily.    [provider]  nitroGLYCERIN (NITROSTAT) 0.4 MG SL tablet Place 1 tablet (0.4 mg total) under the tongue every 5 (five) minutes as needed for chest pain (MAX 3 TABLETS). 02/11/18   Dettinger, Fransisca Kaufmann, MD  omeprazole (PRILOSEC) 20 MG capsule Take 1 capsule (20 mg total) by mouth 2 (two) times daily. 08/04/18   Dettinger, Fransisca Kaufmann, MD  oxymetazoline (AFRIN) 0.05 % nasal spray Place 2 sprays into the nose every 12 (twelve) hours as needed for congestion (x3 days). Dryness    [provider]  potassium chloride SA (KLOR-CON M20) 20 MEQ tablet TAKE ONE TABLET BY MOUTH ONCE DAILY WHEN  YOU  TAKE LASIX 08/12/17   Dettinger, Fransisca Kaufmann, MD  Simethicone (GAS-X EXTRA STRENGTH) 125 MG CAPS Take by mouth as directed. Reported on 10/10/2015 one  after each meal    [provider]  simvastatin (ZOCOR) 20 MG tablet Take 1 tablet (20 mg total) by mouth at bedtime. 02/08/19   Dettinger, Fransisca Kaufmann, MD  vitamin C (ASCORBIC ACID) 500 MG tablet Take 500 mg by mouth daily.    [provider]    Allergies    Patient has no known allergies.  Review of Systems   Review of Systems  Constitutional: Positive for fatigue. Negative for chills and fever.  HENT: Positive for rhinorrhea.   Respiratory: Positive for cough and shortness of breath. Negative for wheezing.   Cardiovascular: Negative for chest pain, palpitations and leg swelling.  Gastrointestinal: Negative for abdominal pain.  Neurological: Positive for weakness.  Psychiatric/Behavioral: Positive for confusion.  All other systems reviewed and are negative.   Physical Exam Updated Vital Signs BP 130/76 (BP Location: Left Arm)   Pulse 92   Temp (!) 97.5 F (36.4 C) (Oral)   Resp 20   Ht 5' 10.5" (1.791 m)   Wt 75.3 kg   SpO2 92%   BMI 23.48 kg/m   Physical Exam Vitals and nursing note reviewed.  Constitutional:      General: He is not in acute distress.    Appearance: He is well-developed.     Comments: Chronically ill appearing elderly male. Appears weak and fatigued  HENT:     Head: Normocephalic and atraumatic.  Eyes:     General: No scleral icterus.       Right eye: No discharge.        Left eye: No discharge.     Conjunctiva/sclera: Conjunctivae normal.     Pupils: Pupils are equal, round, and reactive to light.  Cardiovascular:     Rate and Rhythm: Normal rate and regular rhythm.  Pulmonary:     Effort: Pulmonary effort is normal. No respiratory distress.     Breath sounds: Normal breath sounds.     Comments: Frequent coughing Abdominal:     General: There  is no distension.     Palpations: Abdomen is soft.     Tenderness: There is no abdominal tenderness.  Musculoskeletal:     Cervical back: Normal range of motion.     Right lower leg:  No edema.     Left lower leg: No edema.  Skin:    General: Skin is warm and dry.  Neurological:     Mental Status: He is alert and oriented to person, place, and time.  Psychiatric:        Mood and Affect: Mood normal.        Behavior: Behavior normal.     ED Results / Procedures / Treatments   Labs (all labs ordered are listed, but only abnormal results are displayed) Labs Reviewed  RESPIRATORY PANEL BY RT PCR (FLU A&B, COVID) - Abnormal; Notable for the following components:      Result Value   SARS Coronavirus 2 by RT PCR POSITIVE (*)    All other components within normal limits  BASIC METABOLIC PANEL - Abnormal; Notable for the following components:   Glucose, Bld 129 (*)    BUN 29 (*)    Creatinine, Ser 1.71 (*)    GFR calc non Af Amer 38 (*)    GFR calc Af Amer 43 (*)    All other components within normal limits  BRAIN NATRIURETIC PEPTIDE - Abnormal; Notable for the following components:   B Natriuretic Peptide 345.5 (*)    All other components within normal limits  CBC WITH DIFFERENTIAL/PLATELET - Abnormal; Notable for the following components:   Platelets 144 (*)    All other components within normal limits  TROPONIN I (HIGH SENSITIVITY) - Abnormal; Notable for the following components:   Troponin I (High Sensitivity) 18 (*)    All other components within normal limits  TROPONIN I (HIGH SENSITIVITY) - Abnormal; Notable for the following components:   Troponin I (High Sensitivity) 18 (*)    All other components within normal limits    EKG EKG Interpretation  Date/Time:  Friday May 20 2019 13:29:50 EST Ventricular Rate:  81 PR Interval:    QRS Duration: 89 QT Interval:  414 QTC Calculation: 481 R Axis:   9 Text Interpretation: Sinus rhythm Multiple ventricular premature complexes Probable left atrial enlargement Borderline T abnormalities, inferior leads Borderline prolonged QT interval No significant change since last tracing Confirmed by Deno Etienne  [phone]) on 05/20/2019 3:20:08 PM   Radiology DG Chest Port 1 View  Result Date: 05/20/2019 CLINICAL DATA:  Patient was seen by his PCP today and was sent to the ED for CHF which he has a history of. Patient states he has had increased SOB x 3-4 weeks.Hx hypertension - hx ischemic heart disease - never a smoker EXAM: PORTABLE CHEST 1 VIEW COMPARISON:  09/11/2016 FINDINGS: There are hazy airspace opacities in the right mid lung and in both lower lungs. The remainder of the lungs is clear. There is no evidence of pulmonary edema. No convincing pleural effusion and no pneumothorax. Stable changes from prior CABG surgery. Cardiac silhouette is normal in size. No mediastinal or hilar masses. Skeletal structures are grossly intact. IMPRESSION: 1. Hazy airspace lung opacities in the lower lungs and right mid lung. The appearance is suspicious for COVID-19 multifocal pneumonia. There is no evidence of pulmonary edema. Electronically Signed   By: Lajean Manes M.D.   On: 05/20/2019 14:48    Procedures Procedures (including critical care time)  Medications Ordered in ED Medications  sodium  chloride 0.9 % bolus 500 mL (0 mLs Intravenous Stopped 05/20/19 1841)  acetaminophen (TYLENOL) tablet 650 mg (650 mg Oral Given 05/20/19 1732)  dexamethasone (DECADRON) injection 8 mg (8 mg Intravenous Given 05/20/19 1731)    ED Course  I have reviewed the triage vital signs and the nursing notes.  Pertinent labs & imaging results that were available during my care of the patient were reviewed by me and considered in my medical decision making (see chart for details).  79 year old male presents with weakness, delirium, and SOB with a cough. The patient states it's been worsening for about a month. He also states his cold symptoms started about a week ago. He denies fever. He feels generally weak, fatigued, has no appetite, and gets very SOB with minimal exertion. He is actively coughing in the room but there is no noted  respiratory distress. Will obtain labs, CXR, EKG, COVID test.  COVID test is positive. CBC is remarkable for mildly low platelets. BMP is remarkable for mild AKI (SCr 1.7 up from 1.4). BNP is 345 and I have no value to compare this to. 1st Trop and 2nd Trop is 38. EKG is SR with multiple PVCs and borderline prolonged QT.   CXR shows: Hazy airspace lung opacities in the lower lungs and right mid lung. The appearance is suspicious for COVID-19 multifocal pneumonia. There is no evidence of pulmonary edema.  Pt ambulated with nursing and did well. I tried to get hospital at home set up for him but he lives out of the [place] mile [place]. I left a referral for the infusion clinic on his behalf so hopefully he can get treatment as an outpatient. We did discuss possible admission for him but he states he does not want to stay in the hospital. Since he is not hypoxic I think this is reasonable. Results were discussed with his wife. He was advised to return for worsening symptoms.  MDM Rules/Calculators/A&P                       Final Clinical Impression(s) / ED Diagnoses Final diagnoses:  COVID-19    Rx / DC Orders ED Discharge Orders    None       Recardo Evangelist, PA-C 05/21/19 [place], DO 05/25/19 1504

## 2019-05-20 NOTE — ED Notes (Signed)
Date and time results received: 05/20/19 3:01 PM   Test: COVID-19 Critical Value: Positive  Name of Provider Notified: Ruthann Cancer, PA  Orders Received? Or Actions Taken?

## 2019-05-20 NOTE — ED Notes (Signed)
Orthostatic VS    P 94 BP 118/78  P 100 BP 138/79  P 102 BP 161/86

## 2019-05-20 NOTE — ED Notes (Signed)
Patient given crackers and drink for PO challenge.

## 2019-05-20 NOTE — ED Triage Notes (Signed)
Patient was seen by his PCP today and was sent to the ED for CHF which he has a history of. Patient states he has had increased SOB x 3-4 weeks.

## 2019-05-20 NOTE — Discharge Instructions (Signed)
Take Tylenol as needed for fever Take over the counter cough medicine such as mucinex for cough Follow up with the infusion clinic. A referral has been made. You can call their hotline to leave a message as well. Their number is [phone] Please also follow up with your doctor Return if worsening

## 2019-05-20 NOTE — TOC Initial Note (Signed)
Transition of Care [facility]) - Initial/Assessment Note    Patient Details  Name: Isaac [name] MRN: [id number] Date of Birth: 1940-08-10  Transition of Care [facility]) CM/SW Contact:    Erenest Rasher, RN Phone Number:336 [phone]  05/20/2019, 5:19 PM  Clinical Narrative:                 Spoke to pt and received permission for [facility] program. Faxed referral. Received call back and they are unable to service his area, pt lives 25 miles out of service area. He is in [place]. ED provided updated.   Expected Discharge Plan: Home/Self Care Barriers to Discharge: No Barriers Identified   Patient Goals and CMS Choice        Expected Discharge Plan and Services Expected Discharge Plan: Home/Self Care   Discharge Planning Services: CM Consult                                          Prior Living Arrangements/Services   Lives with:: Spouse   Do you feel safe going back to the place where you live?: Yes          Current home services: DME(rolling walker, cane)    Activities of Daily Living      Permission Sought/Granted Permission sought to share information with : Case Manager, PCP, Family Supports Permission granted to share information with : Yes, Verbal Permission Granted  Share Information with NAME: Isaac [name]  Permission granted to share info w AGENCY: [facility]  Permission granted to share info w Relationship: wife  Permission granted to share info w Contact Information: [phone]  Emotional Assessment   Attitude/Demeanor/Rapport: Gracious Affect (typically observed): Accepting Orientation: : Oriented to Self, Oriented to  Time, Oriented to Place, Oriented to Situation   Psych Involvement: No (comment)  Admission diagnosis:  weak sob Patient Active Problem List   Diagnosis Date Noted  . Right inguinal hernia s/p laparoscopic repair 05/28/2017 05/28/2017  . Left femoral hernia s/p laparoscopic repair 05/28/2017 05/28/2017  .  Cerumen impaction 06/05/2015  . Stage 3 chronic kidney disease 05/22/2015  . Arthritis 06/04/2014  . CHF (congestive heart failure), NYHA class III ([place]) 02/20/2014  . BPH (benign prostatic hyperplasia) 02/20/2014  . T wave inversion in EKG 02/20/2014  . Coronary artery disease 11/25/2013  . Essential hypertension   . Unstable angina ([place]) 11/23/2013  . Dyspnea on exertion 11/08/2013  . Barrett's esophagus 10/27/2011  . GERD (gastroesophageal reflux disease) 07/14/2011  . CAD (coronary artery disease), native coronary artery - LIMA-LAD, SVG-Diag, SVG-RCA 2002   . Hyperlipidemia LDL goal <70   . Hypertensive heart disease    PCP:  Dettinger, Fransisca Kaufmann, MD Pharmacy:   [facility] [address] [place] - [address] [place] [address] Phone: [phone] Fax: [phone]     Social Determinants of Health (SDOH) Interventions    Readmission Risk Interventions No flowsheet data found.

## 2019-05-20 NOTE — Progress Notes (Signed)
Virtual Visit via telephone Note  I connected with Isaac [name] on 05/20/19 at 1048 by telephone and verified that I am speaking with the correct person using two identifiers. Isaac [name] is currently located at home and wife are currently with her during visit. The provider, Isaac Kaufmann Canda Podgorski, MD is located in their office at time of visit.  Call ended at 1059  I discussed the limitations, risks, security and privacy concerns of performing an evaluation and management service by telephone and the availability of in person appointments. I also discussed with the patient that there may be a patient responsible charge related to this service. The patient expressed understanding and agreed to proceed.   History and Present Illness: Patient's wife is calling in for him that he is having complaints of no appetite and has no energy and is losing weight and is not feeling.  He usually sleeps until 2 pm.  He is having trouble breathing. He feels like his heart is giving out.  His bp is 129/71 off of his medication and he restarted his bp medication and it was 149/79, 151/81.  He is losing weight and is not eating or drinking much and his urine is decreased.  He is not coughing but is real short of breath. He denies any fevers or chills or coughing.   1. Weight loss   2. Other fatigue   3. Decreased urine output   4. Shortness of breath     Outpatient Encounter Medications as of 05/20/2019  Medication Sig  . aspirin (ADULT ASPIRIN EC LOW STRENGTH) 81 MG EC tablet Take 81 mg by mouth every morning.   . Cholecalciferol (VITAMIN D) 2000 UNITS tablet Take 2,000 Units by mouth daily.   [name] docusate sodium (COLACE) 100 MG capsule Take 1 capsule by mouth every other day  . doxazosin (CARDURA) 1 MG tablet Take 1 tablet (1 mg total) by mouth 2 (two) times daily.  . fluticasone (FLONASE) 50 MCG/ACT nasal spray Place 2 sprays into both nostrils daily as needed for allergies or rhinitis.   . furosemide  (LASIX) 40 MG tablet Take 1 tablet (40 mg total) by mouth daily as needed for fluid or edema.  . metoprolol succinate (TOPROL-XL) 50 MG 24 hr tablet TAKE 1 TABLET BY MOUTH IN THE MORNING AND 1/2 TABLET IN THE EVENING  . Multiple Vitamin (MULTIVITAMIN WITH MINERALS) TABS Take 1 tablet by mouth daily.   . multivitamin-lutein (OCUVITE-LUTEIN) CAPS capsule Take 1 capsule by mouth daily.  . nitroGLYCERIN (NITROSTAT) 0.4 MG SL tablet Place 1 tablet (0.4 mg total) under the tongue every 5 (five) minutes as needed for chest pain (MAX 3 TABLETS).  [name] omeprazole (PRILOSEC) 20 MG capsule Take 1 capsule (20 mg total) by mouth 2 (two) times daily.  [name] oxymetazoline (AFRIN) 0.05 % nasal spray Place 2 sprays into the nose every 12 (twelve) hours as needed for congestion (x3 days). Dryness  . potassium chloride SA (KLOR-CON M20) 20 MEQ tablet TAKE ONE TABLET BY MOUTH ONCE DAILY WHEN  YOU  TAKE LASIX  . Simethicone (GAS-X EXTRA STRENGTH) 125 MG CAPS Take by mouth as directed. Reported on 10/10/2015 one after each meal  . simvastatin (ZOCOR) 20 MG tablet Take 1 tablet (20 mg total) by mouth at bedtime.  . vitamin C (ASCORBIC ACID) 500 MG tablet Take 500 mg by mouth daily.   No facility-administered encounter medications on file as of 05/20/2019.    Review of Systems  Constitutional: Positive  for activity change, appetite change, fatigue and unexpected weight change. Negative for chills and fever.  Eyes: Negative for visual disturbance.  Respiratory: Positive for shortness of breath. Negative for cough and wheezing.   Cardiovascular: Negative for chest pain and leg swelling.  Gastrointestinal: Negative for nausea and vomiting.  Genitourinary: Positive for decreased urine volume. Negative for dysuria and frequency.  Musculoskeletal: Negative for back pain and gait problem.  Skin: Negative for rash.  Neurological: Positive for weakness.  All other systems reviewed and are negative.   Observations/Objective:  Isaac [name] was with stepdaughter and wife  Assessment and Plan: Problem List Items Addressed This Visit    None    Visit Diagnoses    Weight loss    -  Primary   Other fatigue       Decreased urine output       Shortness of breath         With patient's fatigue and decreased urine output and shortness of breath and weight loss we recommended that he go to the emergency department for evaluation, it sounds like possibly he could have urinary tract infection or maybe he is going into congestive heart failure exacerbation again, he is also having decreased urine output and swelling per family.  We discussed that it was appropriate for him to go to the emergency department and he is going to go to [facility]  Follow up plan: Return if symptoms worsen or fail to improve.  Follow-up after the hospital visit   I discussed the assessment and treatment plan with the patient. The patient was provided an opportunity to ask questions and all were answered. The patient agreed with the plan and demonstrated an understanding of the instructions.   The patient was advised to call back or seek an in-person evaluation if the symptoms worsen or if the condition fails to improve as anticipated.  The above assessment and management plan was discussed with the patient. The patient verbalized understanding of and has agreed to the management plan. Patient is aware to call the clinic if symptoms persist or worsen. Patient is aware when to return to the clinic for a follow-up visit. Patient educated on when it is appropriate to go to the emergency department.    I provided 11 minutes of non-face-to-face time during this encounter.    Worthy Rancher, MD

## 2019-05-21 ENCOUNTER — Telehealth (HOSPITAL_COMMUNITY): Payer: Self-pay | Admitting: Nurse Practitioner

## 2019-05-21 ENCOUNTER — Other Ambulatory Visit (HOSPITAL_COMMUNITY): Payer: Self-pay | Admitting: Nurse Practitioner

## 2019-05-21 DIAGNOSIS — U071 COVID-19: Secondary | ICD-10-CM

## 2019-05-21 NOTE — Telephone Encounter (Signed)
Spoke to patient's wife as patient was sleeping. Mr. Soqui tested positive for covid. Symptoms started one week ago. Symptomatic of fever, chills, fatigue, cough, body aches. Seeen in ER yesterday. Discussed mab therapy. Patient's wife feels he owuld agree to treatment and patient qualifies based on age. Will scheduled. Asked patient's wife to have him call me back to discuss further. Also assisted her in scheduling testing for herself and close contacts.

## 2019-05-21 NOTE — Progress Notes (Signed)
  I connected by phone with Isaac [name] on 05/21/2019 at 8:35 AM to discuss the potential use of an new treatment for mild to moderate COVID-19 viral infection in non-hospitalized patients.  This patient is a 79 y.o. male that meets the FDA criteria for Emergency Use Authorization of bamlanivimab or casirivimab\imdevimab.  Has a (+) direct SARS-CoV-2 viral test result  Has mild or moderate COVID-19   Is ? 79 years of age and weighs ? 40 kg  Is NOT hospitalized due to COVID-19  Is NOT requiring oxygen therapy or requiring an increase in baseline oxygen flow rate due to COVID-19  Is within 10 days of symptom onset  Has at least one of the high risk factor(s) for progression to severe COVID-19 and/or hospitalization as defined in EUA.  Specific high risk criteria : >/= 79 yo   I have spoken and communicated the following to the patient or parent/caregiver:  1. FDA has authorized the emergency use of bamlanivimab and casirivimab\imdevimab for the treatment of mild to moderate COVID-19 in adults and pediatric patients with positive results of direct SARS-CoV-2 viral testing who are 70 years of age and older weighing at least 40 kg, and who are at high risk for progressing to severe COVID-19 and/or hospitalization.  2. The significant known and potential risks and benefits of bamlanivimab and casirivimab\imdevimab, and the extent to which such potential risks and benefits are unknown.  3. Information on available alternative treatments and the risks and benefits of those alternatives, including clinical trials.  4. Patients treated with bamlanivimab and casirivimab\imdevimab should continue to self-isolate and use infection control measures (e.g., wear mask, isolate, social distance, avoid sharing personal items, clean and disinfect "high touch" surfaces, and frequent handwashing) according to CDC guidelines.   5. The patient or parent/caregiver has the option to accept or refuse  bamlanivimab or casirivimab\imdevimab .  After reviewing this information with the patient, The patient agreed to proceed with receiving the bamlanimivab infusion and will be provided a copy of the Fact sheet prior to receiving the infusion.[name] 05/21/2019 8:35 AM

## 2019-05-23 ENCOUNTER — Telehealth: Payer: Self-pay | Admitting: Nurse Practitioner

## 2019-05-23 ENCOUNTER — Ambulatory Visit (HOSPITAL_COMMUNITY)
Admission: RE | Admit: 2019-05-23 | Discharge: 2019-05-23 | Disposition: A | Discharge status: Home / Self Care | Payer: Medicare Other | Source: Ambulatory Visit | Attending: Pulmonary Disease | Admitting: Pulmonary Disease

## 2019-05-23 DIAGNOSIS — U071 COVID-19: Secondary | ICD-10-CM

## 2019-05-23 DIAGNOSIS — Z23 Encounter for immunization: Secondary | ICD-10-CM | POA: Insufficient documentation

## 2019-05-23 MED ORDER — SODIUM CHLORIDE 0.9 % IV SOLN
700.0000 mg | Freq: Once | INTRAVENOUS | Status: DC
  Filled 2019-05-23: qty 20

## 2019-05-23 MED ORDER — FAMOTIDINE IN NACL 20-0.9 MG/50ML-% IV SOLN: 20.0000 mg | Freq: Once | INTRAVENOUS | Status: DC | PRN

## 2019-05-23 MED ORDER — DIPHENHYDRAMINE HCL 50 MG/ML IJ SOLN: 50.0000 mg | Freq: Once | INTRAMUSCULAR | Status: DC | PRN

## 2019-05-23 MED ORDER — METHYLPREDNISOLONE SODIUM SUCC 125 MG IJ SOLR: 125.0000 mg | Freq: Once | INTRAMUSCULAR | Status: DC | PRN

## 2019-05-23 MED ORDER — ALBUTEROL SULFATE HFA 108 (90 BASE) MCG/ACT IN AERS: 2.0000 | INHALATION_SPRAY | Freq: Once | RESPIRATORY_TRACT | Status: DC | PRN

## 2019-05-23 MED ORDER — EPINEPHRINE 0.3 MG/0.3ML IJ SOAJ: 0.3000 mg | Freq: Once | INTRAMUSCULAR | Status: DC | PRN

## 2019-05-23 MED ORDER — SODIUM CHLORIDE 0.9 % IV SOLN
700.0000 mg | Freq: Once | INTRAVENOUS | Status: AC
  Administered 2019-05-23: 700 mg via INTRAVENOUS
  Filled 2019-05-23: qty 20

## 2019-05-23 MED ORDER — SODIUM CHLORIDE 0.9 % IV SOLN
INTRAVENOUS | Status: DC | PRN
  Administered 2019-05-23: 250 mL via INTRAVENOUS

## 2019-05-23 NOTE — Telephone Encounter (Signed)
  I connected by phone with [name] on 05/23/2019 at 11:19 AM to discuss the potential use of an new treatment for mild to moderate COVID-19 viral infection in non-hospitalized patients.  Again reviewed criteria for use of MAB treatments and that patient meets criteria to receive treatment. He would like to receive treatment and has appointment scheduled today for 2:30 PM at [place] has authorized the emergency use of bamlanivimab and casirivimab\imdevimab for the treatment of mild to moderate COVID-19 in adults and pediatric patients with positive results of direct SARS-CoV-2 viral testing who are 60 years of age and older weighing at least 40 kg, and who are at high risk for progressing to severe COVID-19 and/or hospitalization.  2. The significant known and potential risks and benefits of bamlanivimab and casirivimab\imdevimab, and the extent to which such potential risks and benefits are unknown.  3. Information on available alternative treatments and the risks and benefits of those alternatives, including clinical trials.  4. Patients treated with bamlanivimab and casirivimab\imdevimab should continue to self-isolate and use infection control measures (e.g., wear mask, isolate, social distance, avoid sharing personal items, clean and disinfect "high touch" surfaces, and frequent handwashing) according to CDC guidelines.   5. The patient or parent/caregiver has the option to accept or refuse bamlanivimab or casirivimab\imdevimab .  [name] 05/23/2019 11:19 AM

## 2019-05-23 NOTE — Discharge Instructions (Signed)

## 2019-05-23 NOTE — Progress Notes (Signed)
  Diagnosis: COVID-19  Physician:Dr. Asencion Noble  Procedure: [facility] Med: bamlanivimab infusion - Provided patient with bamlanimivab fact sheet for patients, parents and caregivers prior to infusion.  Complications: No immediate complications noted.  Discharge: Discharged home   Isaac [name] 05/23/2019

## 2019-05-24 ENCOUNTER — Ambulatory Visit (HOSPITAL_COMMUNITY): Payer: Medicare PPO

## 2019-08-11 ENCOUNTER — Encounter: Payer: Self-pay | Admitting: Family Medicine

## 2019-08-11 ENCOUNTER — Other Ambulatory Visit: Payer: Self-pay

## 2019-08-11 ENCOUNTER — Ambulatory Visit: Payer: Medicare PPO | Admitting: Family Medicine

## 2019-08-11 VITALS — BP 151/70 | HR 65 | Temp 97.4°F | Ht 70.5 in | Wt 169.4 lb

## 2019-08-11 DIAGNOSIS — H6123 Impacted cerumen, bilateral: Secondary | ICD-10-CM

## 2019-08-11 DIAGNOSIS — I10 Essential (primary) hypertension: Secondary | ICD-10-CM

## 2019-08-11 DIAGNOSIS — E785 Hyperlipidemia, unspecified: Secondary | ICD-10-CM

## 2019-08-11 DIAGNOSIS — N1832 Chronic kidney disease, stage 3b: Secondary | ICD-10-CM | POA: Diagnosis not present

## 2019-08-11 DIAGNOSIS — I5042 Chronic combined systolic (congestive) and diastolic (congestive) heart failure: Secondary | ICD-10-CM | POA: Diagnosis not present

## 2019-08-11 DIAGNOSIS — I110 Hypertensive heart disease with heart failure: Secondary | ICD-10-CM | POA: Diagnosis not present

## 2019-08-11 LAB — CBC WITH DIFFERENTIAL/PLATELET
Basophils Absolute: 0.1 10*3/uL (ref 0.0–0.2)
Basos: 1 %
EOS (ABSOLUTE): 0.4 10*3/uL (ref 0.0–0.4)
Eos: 5 %
Hematocrit: 40 % (ref 37.5–51.0)
Hemoglobin: 13.2 g/dL (ref 13.0–17.7)
Immature Grans (Abs): 0 10*3/uL (ref 0.0–0.1)
Immature Granulocytes: 0 %
Lymphocytes Absolute: 2.2 10*3/uL (ref 0.7–3.1)
Lymphs: 26 %
MCH: 31 pg (ref 26.6–33.0)
MCHC: 33 g/dL (ref 31.5–35.7)
MCV: 94 fL (ref 79–97)
Monocytes Absolute: 0.8 10*3/uL (ref 0.1–0.9)
Monocytes: 9 %
Neutrophils Absolute: 4.8 10*3/uL (ref 1.4–7.0)
Neutrophils: 59 %
Platelets: 176 10*3/uL (ref 150–450)
RBC: 4.26 x10E6/uL (ref 4.14–5.80)
RDW: 14.3 % (ref 11.6–15.4)
WBC: 8.3 10*3/uL (ref 3.4–10.8)

## 2019-08-11 LAB — LIPID PANEL
Chol/HDL Ratio: 3 ratio (ref 0.0–5.0)
Cholesterol, Total: 169 mg/dL (ref 100–199)
HDL: 56 mg/dL (ref >39)
LDL Chol Calc (NIH): 92 mg/dL (ref 0–99)
Triglycerides: 119 mg/dL (ref 0–149)
VLDL Cholesterol Cal: 21 mg/dL (ref 5–40)

## 2019-08-11 LAB — CMP14+EGFR
ALT: 9 IU/L (ref 0–44)
AST: 19 IU/L (ref 0–40)
Albumin/Globulin Ratio: 1.6 (ref 1.2–2.2)
Albumin: 4.5 g/dL (ref 3.7–4.7)
Alkaline Phosphatase: 81 IU/L (ref 39–117)
BUN/Creatinine Ratio: 14 (ref 10–24)
BUN: 20 mg/dL (ref 8–27)
Bilirubin Total: 0.4 mg/dL (ref 0.0–1.2)
CO2: 21 mmol/L (ref 20–29)
Calcium: 9.2 mg/dL (ref 8.6–10.2)
Chloride: 105 mmol/L (ref 96–106)
Creatinine, Ser: 1.44 mg/dL — ABNORMAL HIGH (ref 0.76–1.27)
GFR calc Af Amer: 53 mL/min/{1.73_m2} — ABNORMAL LOW (ref >59)
GFR calc non Af Amer: 46 mL/min/{1.73_m2} — ABNORMAL LOW (ref >59)
Globulin, Total: 2.9 g/dL (ref 1.5–4.5)
Glucose: 103 mg/dL — ABNORMAL HIGH (ref 65–99)
Potassium: 5.1 mmol/L (ref 3.5–5.2)
Sodium: 141 mmol/L (ref 134–144)
Total Protein: 7.4 g/dL (ref 6.0–8.5)

## 2019-08-11 MED ORDER — ONDANSETRON 4 MG PO TBDP: 4.0000 mg | ORAL_TABLET | Freq: Three times a day (TID) | ORAL | 0 refills | Status: DC | PRN

## 2019-08-11 MED ORDER — OMEPRAZOLE 20 MG PO CPDR: 20.0000 mg | DELAYED_RELEASE_CAPSULE | Freq: Two times a day (BID) | ORAL | 3 refills | Status: DC

## 2019-08-11 NOTE — Progress Notes (Signed)
BP (!) 151/70   Pulse 65   Temp (!) 97.4 F (36.3 C) (Temporal)   Ht 5' 10.5" (1.791 m)   Wt 169 lb 6 oz (76.8 kg)   BMI 23.96 kg/m    Subjective:   Patient ID: Isaac [name], male    DOB: [birth year], 79 y.o.   MRN: [id number]  HPI: Isaac [name] is a 79 y.o. male presenting on 08/11/2019 for Medical Management of Chronic Issues   HPI Hypertension and congestive heart failure Patient is currently on metoprolol and lisinopril and doxazosin, and their blood pressure today is 151/70. Patient denies any lightheadedness or dizziness. Patient denies headaches, blurred vision, chest pains, shortness of breath, or weakness. Denies any side effects from medication and is content with current medication.  Patient has been having some exertional dyspnea that is been increasing over the past few months.  He says been weighing himself every day and he stays right around the 1 60-1 65 range, is 169 here in the office today with close.  Hyperlipidemia Patient is coming in for recheck of his hyperlipidemia. The patient is currently taking simvastatin. They deny any issues with myalgias or history of liver damage from it. They deny any focal numbness or weakness or chest pain.   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 Patient has a nephrologist and cardiologist to help manage this, recommended for him to go back and see them.  Relevant past medical, surgical, family and social history reviewed and updated as indicated. Interim medical history since our last visit reviewed. Allergies and medications reviewed and updated.  Review of Systems  Constitutional: Negative for chills and fever.  Eyes: Negative for visual disturbance.  Respiratory: Positive for shortness of breath. Negative for wheezing.   Cardiovascular: Negative for chest pain and leg swelling.  Musculoskeletal: Negative for back pain and gait problem.  Skin: Negative for rash.  Neurological: Negative for  dizziness, weakness and light-headedness.  All other systems reviewed and are negative.   Per HPI unless specifically indicated above   Allergies as of 08/11/2019   No Known Allergies     Medication List       Accurate as of August 11, 2019  9:35 AM. If you have any questions, ask your nurse or doctor.        Adult Aspirin EC Low Strength 81 MG EC tablet Generic drug: aspirin Take 81 mg by mouth every morning.   docusate sodium 100 MG capsule Commonly known as: COLACE Take 1 capsule by mouth every other day   doxazosin 1 MG tablet Commonly known as: Cardura Take 1 tablet (1 mg total) by mouth 2 (two) times daily.   fluticasone 50 MCG/ACT nasal spray Commonly known as: FLONASE Place 2 sprays into both nostrils daily as needed for allergies or rhinitis.   furosemide 40 MG tablet Commonly known as: LASIX Take 1 tablet (40 mg total) by mouth daily as needed for fluid or edema.   Gas-X Extra Strength 125 MG Caps Generic drug: Simethicone Take by mouth as directed. Reported on 10/10/2015 one after each meal   metoprolol succinate 50 MG 24 hr tablet Commonly known as: TOPROL-XL TAKE 1 TABLET BY MOUTH IN THE MORNING AND 1/2 TABLET IN THE EVENING What changed:   how much to take  how to take this  when to take this  additional instructions   multivitamin with minerals Tabs tablet Take 1 tablet by mouth daily.   multivitamin-lutein Caps  capsule Take 1 capsule by mouth daily.   nitroGLYCERIN 0.4 MG SL tablet Commonly known as: NITROSTAT Place 1 tablet (0.4 mg total) under the tongue every 5 (five) minutes as needed for chest pain (MAX 3 TABLETS).   omeprazole 20 MG capsule Commonly known as: PRILOSEC Take 1 capsule (20 mg total) by mouth 2 (two) times daily.   oxymetazoline 0.05 % nasal spray Commonly known as: AFRIN Place 2 sprays into the nose every 12 (twelve) hours as needed for congestion (x3 days). Dryness   potassium chloride SA 20 MEQ  tablet Commonly known as: Klor-Con M20 TAKE ONE TABLET BY MOUTH ONCE DAILY WHEN  YOU  TAKE LASIX What changed:   how much to take  how to take this  when to take this  additional instructions   simvastatin 20 MG tablet Commonly known as: ZOCOR Take 1 tablet (20 mg total) by mouth at bedtime.   vitamin C 500 MG tablet Commonly known as: ASCORBIC ACID Take 500 mg by mouth daily.   Vitamin D 50 MCG (2000 UT) tablet Take 2,000 Units by mouth daily.        Objective:   BP (!) 151/70   Pulse 65   Temp (!) 97.4 F (36.3 C) (Temporal)   Ht 5' 10.5" (1.791 m)   Wt 169 lb 6 oz (76.8 kg)   BMI 23.96 kg/m   Wt Readings from Last 3 Encounters:  08/11/19 169 lb 6 oz (76.8 kg)  05/20/19 166 lb (75.3 kg)  02/02/19 169 lb (76.7 kg)    Physical Exam Vitals and nursing note reviewed.  Constitutional:      General: He is not in acute distress.    Appearance: He is well-developed. He is not diaphoretic.  Eyes:     General: No scleral icterus.       Right eye: No discharge.     Conjunctiva/sclera: Conjunctivae normal.  Neck:     Thyroid: No thyromegaly.  Cardiovascular:     Rate and Rhythm: Normal rate and regular rhythm.     Heart sounds: Murmur (2 out of 6 early systolic murmur best heard in the mitral region) present.  Pulmonary:     Effort: Pulmonary effort is normal. No respiratory distress.     Breath sounds: Rales (Bibasilar crackles) present. No wheezing.  Musculoskeletal:        General: Normal range of motion.     Cervical back: Neck supple.  Lymphadenopathy:     Cervical: No cervical adenopathy.  Skin:    General: Skin is warm and dry.     Findings: No rash.  Neurological:     Mental Status: He is alert and oriented to person, place, and time.     Coordination: Coordination normal.  Psychiatric:        Behavior: Behavior normal.       Assessment & Plan:   Problem List Items Addressed This Visit      Cardiovascular and Mediastinum   Hypertensive  heart disease - Primary (Chronic)   Relevant Orders   CBC with Differential/Platelet   CMP14+EGFR   Lipid panel   Essential hypertension   Relevant Orders   CBC with Differential/Platelet   CMP14+EGFR   Lipid panel     Nervous and Auditory   Cerumen impaction     Genitourinary   Stage 3 chronic kidney disease   Relevant Orders   CBC with Differential/Platelet   CMP14+EGFR   Lipid panel     Other  Hyperlipidemia LDL goal <70 (Chronic)   Relevant Orders   CBC with Differential/Platelet   CMP14+EGFR   Lipid panel      Recommended doubling the Lasix for 2 days and then going back to his normal dose to help eradicate some of the fluid, also recommended to go see his nephrologist and cardiologist.  We will check blood work today Follow up plan: Return in about 6 months (around 02/10/2020), or if symptoms worsen or fail to improve, for Hypertension and cholesterol and CKD.  Counseling provided for all of the vaccine components No orders of the defined types were placed in this encounter.   Caryl Pina, MD [facility] 08/11/2019, 9:35 AM

## 2019-08-11 NOTE — Patient Instructions (Signed)
Doubled the Lasix, take 2 tablets for the next 2 days and then go back to normal dose of 1 a day.

## 2019-08-18 NOTE — Progress Notes (Signed)
Cardiology Office Note Date:  08/18/2019  Patient ID:  Isaac, [name] [birth year], MRN [id number] PCP:  Dettinger, Fransisca Kaufmann, MD  Cardiologist:  Dr. Lovena Le    Chief Complaint: SOB, CP   History of Present Illness: Isaac [name] is a 79 y.o. male with history of CAD (CABG 2015), known 2 occl grafts, GERD, HTN, HLD, CBP, CRI (III).    He saw Dr. Lovena Le May 2018, at that time note mentions some degree of underlying lung disease, with patient c/o worsening DOE when walking inclines.   Dr. Lovena Le stated " He will undergo ongoing pulmonary eval. I am concerned that he has either developed worsening LV dysfunction or is developing ischemic induced diastolic dysfunction. I have recommended he undergo exerecise testing with a stress echo did look at the physiologic burden of his ischemia. I would envision referral for cath if he has an abnormal stress echo. I would anticipate increasing his diuretic, particularly if the stress echo is negative" PFTs were pending.  Stress echo was OK, low risk with no evidence of stress induced ischemia, LVEF 55% at rest with akinetic basalinferior wall, stress LVEF 75%, same WMA  He saw Dr. Melvyn Novas in August, no clear or ongoing pulmonary issues or recommendations  I saw him Jan 2019 He had custody of his 11 month and 4 year grandchildren reported was able to keep up with them.  He was able to chop his wood and haul/stack it, without changes in his exertional capacity, he will occasionally have to take breaks though not often.  He only reported trouble breathing 2/2 is nasal congestion and his nasal spray typically keeps this pretty well controlled.   Mentioned that he had a pneumonia in Nov 2017 and felt like his lungs were never quite the same.  No CP of any kind, no dizziness, near syncope or syncope.  No rest SOB, no symptoms of PND or orthopnea. He feels like his exertional capacity in the last year has been stable, if anything better of late. He has ongoing groin  pain with his surgery, and looking forward to his surgery and getting the hernia taken care of.  He has had subsequent visits with Dr. Lovena Le, most recently 02/02/2019.  No angina, recommended low sodium diet for class II symptoms and his HTN   He saw Dr. Warrick Parisian 08/11/2019, recommended to follow up with his nephrologist and cardiologist for increased exertional SOB in the past few months, reported stable weights at home 160-165 (169 in clinic with clothes on).  TODAY He is accompanied by his wife.  He had COVID in [place], treated outpatient via the infusion clinic, though they say they were told they wanted to admit him, but no bed was available.  He reports feeling awful for weeks and since then has just never felt well again. He has nausea and marked DOE, and his exertional capacity is terrible with marked SOB with even casual walking, and in the last month associated with CP.  Both the SOB and CP/tightness resolve after sitting, recovers in a minute or so. No near syncope or syncope. He went from shopping and stacking wood to having to rest after putting to logs in the fireplace.  His wife says he just gives out with minimal exertion. No rest SOB, no rest CP No near synope or syncope He did not appreciate any improvement with the couple days of extra lasix   Past Medical History:  Diagnosis Date  . Allergic rhinitis   .  Allergy   . Arthritis 06/04/2014   Trial of sulindac 05/30/14 since cri risk with other nsaids   . Barrett's esophagus 10/27/2011  . BPH (benign prostatic hyperplasia) 02/20/2014  . CAD (coronary artery disease) of artery bypass graft, Occluded VG-non dominant LCX medical therapy 11/25/2013  . CAD (coronary artery disease), native coronary artery - LIMA-LAD, SVG-Diag, SVG-RCA 2002    Catheterization 2002 showing occluded LAD, 99% diagonal, occluded distal circumflex, 90% nondominant right coronary artery  CABG 08/06/00 by Dr. Nils Pyle with LIMA to LAD, SVG to diagonal, SVG to  right coronary artery, circumflex was noted to be too small to graft    . CAP (community acquired pneumonia) 04/16/2016   See cxr 04/15/16 > treated with 7 days of Levaquin 500 mg daily with resolution radiographically and clinically.  . Cataract   . Cerumen impaction 06/05/2015  . CHF (congestive heart failure), NYHA class III ([place]) 02/20/2014  . Dyspnea    with exertion   . Dyspnea on exertion 11/08/2013   Followed as Primary Care Patient/ [facility]/ [name]  - new onset early June 2015  - 11/08/2013  Walked RA x 3 laps @ 185 ft each stopped due to  End of study, mild sob, no cp and no desat, EKG ok  - Cardiac w/u inconclusive but not clearly having ischemia  11/24/13   - 09/11/2016  Walked RA x 3 laps @ 185 ft each stopped due to  End of study, nl pace, no sob or desat   - Spirometry 09/11/2016    . Essential hypertension    Referred to [facility] med [facility] 12/12/2016   . GERD (gastroesophageal reflux disease)   . H/O hiatal hernia   . History of skin cancer    bilateral arms with removal  . Hyperlipidemia   . Hyperlipidemia LDL goal <70    Followed as Primary Care Patient/ [facility]/ [name]     - Target LDL < 70 as has IHD    . Hypertension   . Hypertensive heart disease    Followed as Primary Care Patient/ [facility]/ [name]     . Ischemic heart disease   . Left femoral hernia s/p laparoscopic repair 05/28/2017 05/28/2017  . Lumbar disc disease   . Myocardial infarction ([place]) 2002  . Right inguinal hernia s/p laparoscopic repair 05/28/2017 05/28/2017  . Stage 3 chronic kidney disease 05/22/2015   trial off lasix/ppi/clinoril / [place] and renal eval 05/22/2015 >>>    . T wave inversion in EKG 02/20/2014  . Unstable angina ([place]) 11/23/2013    Past Surgical History:  Procedure Laterality Date  . COLONOSCOPY    . CORONARY ARTERY BYPASS GRAFT  2002   LIMA-LAD, SVG-Diag, SVG-RCA  . EYE SURGERY Bilateral 1995, 2000   ioc for cataracts  . GROIN DISSECTION  01/23/2012   Procedure:  Virl Son EXPLORATION;  Surgeon: Harl Bowie, MD;  Location: [facility];  Service: General;  Laterality: Left;  Left Inguinal Exploration, release of scar tissue, Placement of Mesh  . INGUINAL HERNIA REPAIR Left 1983  . INGUINAL HERNIA REPAIR Bilateral 05/28/2017   Procedure: LAPAROSCOPIC RIGHT AND LEFT INGUINAL HERNIA REPAIR WITH MESH;  Surgeon: Michael Boston, MD;  Location: [facility];  Service: General;  Laterality: Bilateral;  . INSERTION OF MESH Bilateral 05/28/2017   Procedure: INSERTION OF MESH;  Surgeon: Michael Boston, MD;  Location: [facility];  Service: General;  Laterality: Bilateral;  . LEFT HEART CATHETERIZATION WITH CORONARY ANGIOGRAM N/A 11/24/2013  Procedure: LEFT HEART CATHETERIZATION WITH CORONARY ANGIOGRAM;  Surgeon: Peter M Martinique, MD;  Location[facility] LAB;  Service: Cardiovascular;  Laterality: N/A;  . NOSE SURGERY  1985    Current Outpatient Medications  Medication Sig Dispense Refill  . aspirin (ADULT ASPIRIN EC LOW STRENGTH) 81 MG EC tablet Take 81 mg by mouth every morning.     . Cholecalciferol (VITAMIN D) 2000 UNITS tablet Take 2,000 Units by mouth daily.     [name] docusate sodium (COLACE) 100 MG capsule Take 1 capsule by mouth every other day    . doxazosin (CARDURA) 1 MG tablet Take 1 tablet (1 mg total) by mouth 2 (two) times daily. 180 tablet 3  . fluticasone (FLONASE) 50 MCG/ACT nasal spray Place 2 sprays into both nostrils daily as needed for allergies or rhinitis.     . furosemide (LASIX) 40 MG tablet Take 1 tablet (40 mg total) by mouth daily as needed for fluid or edema. 30 tablet 5  . metoprolol succinate (TOPROL-XL) 50 MG 24 hr tablet TAKE 1 TABLET BY MOUTH IN THE MORNING AND 1/2 TABLET IN THE EVENING (Patient taking differently: Take 25-50 mg by mouth See admin instructions. TAKE 50mg  BY MOUTH IN THE MORNING AND 25mg   IN THE EVENING) 135 tablet 3  . Multiple Vitamin (MULTIVITAMIN WITH MINERALS) TABS Take 1 tablet by mouth daily.      . multivitamin-lutein (OCUVITE-LUTEIN) CAPS capsule Take 1 capsule by mouth daily.    . nitroGLYCERIN (NITROSTAT) 0.4 MG SL tablet Place 1 tablet (0.4 mg total) under the tongue every 5 (five) minutes as needed for chest pain (MAX 3 TABLETS). 10 tablet 6  . omeprazole (PRILOSEC) 20 MG capsule Take 1 capsule (20 mg total) by mouth 2 (two) times daily. 180 capsule 3  . ondansetron (ZOFRAN ODT) 4 MG disintegrating tablet Take 1 tablet (4 mg total) by mouth every 8 (eight) hours as needed for nausea or vomiting. 20 tablet 0  . oxymetazoline (AFRIN) 0.05 % nasal spray Place 2 sprays into the nose every 12 (twelve) hours as needed for congestion (x3 days). Dryness    . potassium chloride SA (KLOR-CON M20) 20 MEQ tablet TAKE ONE TABLET BY MOUTH ONCE DAILY WHEN  YOU  TAKE LASIX (Patient taking differently: Take 20 mEq by mouth See admin instructions. Take with Lasix) 30 tablet 5  . Simethicone (GAS-X EXTRA STRENGTH) 125 MG CAPS Take by mouth as directed. Reported on 10/10/2015 one after each meal    . simvastatin (ZOCOR) 20 MG tablet Take 1 tablet (20 mg total) by mouth at bedtime. 90 tablet 3  . vitamin C (ASCORBIC ACID) 500 MG tablet Take 500 mg by mouth daily.     No current facility-administered medications for this visit.    Allergies:   Patient has no known allergies.   Social History:  The patient  reports that he has never smoked. He quit smokeless tobacco use about 5 years ago.  His smokeless tobacco use included chew. He reports that he does not drink alcohol or use drugs.   Family History:  The patient's family history includes Breast cancer in his mother; COPD in his brother; Dementia (age of onset: 40) in his father; Diabetes in his mother and sister; Hypertension in his father.  ROS:  Please see the history of present illness.   All other systems are reviewed and otherwise negative.   PHYSICAL EXAM:  VS:  There were no vitals taken for this visit. BMI: There is no  height or weight on  file to calculate BMI. Well nourished, well developed, in no acute distress  HEENT: normocephalic, atraumatic  Neck: no JVD, carotid bruits or masses Cardiac:  RRR; no significant murmurs, no rubs, or gallops Lungs:  CTA b/l, no wheezing, rhonchi or rales  Abd: soft, nontender MS: no deformities, age appropriate atrophy Ext:  no edema  Skin: warm and dry, no rash Neuro:  No gross deficits appreciated Psych: euthymic mood, full affect   EKG:  Done today and reviewed by myself  SR 63, no acute or ischemic changes  10/07/16: stress echo Study Conclusions - Stress ECG conclusions: There were no stress arrhythmias or   conduction abnormalities. The stress ECG was normal. - Staged echo: There was no echocardiographic evidence for   stress-induced ischemia. Low risk. - Baseline: LV global systolic function was normal. The estimated   LV ejection fraction was 55%. Akinesis of the basalinferior LV   myocardium. - Peak stress: LV global systolic function was hyperdynamic. The   estimated LV ejection fraction was 75%. Akinesis of the basal   inferior LV myocardium.  02/21/14: TTE Study Conclusions - Left ventricle: The cavity size was normal. There was moderate concentric hypertrophy. Systolic function was normal. The estimated ejection fraction was in the range of 55% to 60%. Wall motion was normal; there were no regional wall motion abnormalities. Doppler parameters are consistent with abnormal left ventricular relaxation (grade 1 diastolic dysfunction). The E/e&' ratio is between 8-15, suggesting indeterminate LV filling pressure. - Aortic valve: Sclerosis without stenosis. There was mild regurgitation. Regurgitation pressure half-time: 510 ms. - Mitral valve: Mildly thickened leaflets . There was mild regurgitation. - Left atrium: Moderately dilated at 45 ml/m2. - Tricuspid valve: There was mild regurgitation. - Pulmonary arteries: PA peak pressure: 35 mm Hg  (S). - Inferior vena cava: The vessel was normal in size. The respirophasic diameter changes were in the normal range (>= 50%), consistent with normal central venous pressure. Impressions: - Compared to the prior echo in 10/2013, the EF is stable without clear wall motion abnormality. There is now only mild AI and MR. The LA is moderately dilated with mild TR and the RVSP is somewhat reduced.  Recent Labs: 05/20/2019: B Natriuretic Peptide 345.5 08/11/2019: ALT 9; BUN 20; Creatinine, Ser 1.44; Hemoglobin 13.2; Platelets 176; Potassium 5.1; Sodium 141  08/11/2019: Chol/HDL Ratio 3.0; Cholesterol, Total 169; HDL 56; LDL Chol Calc (NIH) 92; Triglycerides 119   Estimated Creatinine Clearance: 44.4 mL/min (A) (by C-G formula based on SCr of 1.44 mg/dL (H)).   Wt Readings from Last 3 Encounters:  08/11/19 169 lb 6 oz (76.8 kg)  05/20/19 166 lb (75.3 kg)  02/02/19 169 lb (76.7 kg)     Other studies reviewed: Additional studies/records reviewed today include: summarized above  ASSESSMENT AND PLAN:  1. CAD     On ASA, BB, statin tx     Stress echo in may 2018 was ok  2. HTN     No changes, looks OK  3. HLD     Monitored and managed with his PMD     Not addressed today  4. Hx of diastolic dysfunction     No exam findings to suggest fluid OL     Weight is stable from Oct, and reportedly at home as well   Symptoms started with his COVID infection, he has had steady functional decline since then I have reviewed the case and EKG with Dr. Lovena Le, start with an echo, if  stable refer to pulmonologist.  If EF is down or new WMA will need cath   Disposition: await echo, will plan to see him in 2weeks, sooner if needed.   Current medicines are reviewed at length with the patient today.  The patient did not have any concerns regarding medicines.  Venetia Night, PA-C 08/18/2019 5:57 AM     [facility] [place] [address] [phone] (office)  [phone] (fax)

## 2019-08-19 ENCOUNTER — Ambulatory Visit: Payer: Medicare PPO | Admitting: Physician Assistant

## 2019-08-19 ENCOUNTER — Other Ambulatory Visit: Payer: Self-pay

## 2019-08-19 VITALS — BP 122/74 | HR 65 | Ht 70.5 in | Wt 169.0 lb

## 2019-08-19 DIAGNOSIS — I5032 Chronic diastolic (congestive) heart failure: Secondary | ICD-10-CM

## 2019-08-19 DIAGNOSIS — R0600 Dyspnea, unspecified: Secondary | ICD-10-CM

## 2019-08-19 DIAGNOSIS — R079 Chest pain, unspecified: Secondary | ICD-10-CM | POA: Diagnosis not present

## 2019-08-19 DIAGNOSIS — I2510 Atherosclerotic heart disease of native coronary artery without angina pectoris: Secondary | ICD-10-CM

## 2019-08-19 DIAGNOSIS — I10 Essential (primary) hypertension: Secondary | ICD-10-CM

## 2019-08-19 DIAGNOSIS — R0609 Other forms of dyspnea: Secondary | ICD-10-CM

## 2019-08-19 NOTE — Patient Instructions (Signed)
Medication Instructions:   Your physician recommends that you continue on your current medications as directed. Please refer to the Current Medication list given to you today.  *If you need a refill on your cardiac medications before your next appointment, please call your pharmacy*   Lab Work: [place]   If you have labs (blood work) drawn today and your tests are completely normal, you will receive your results only by: [name] MyChart Message (if you have MyChart) OR . A paper copy in the mail If you have any lab test that is abnormal or we need to change your treatment, we will call you to review the results.   Testing/Procedures: Your physician has requested that you have an echocardiogram. Echocardiography is a painless test that uses sound waves to create images of your heart. It provides your doctor with information about the size and shape of your heart and how well your heart's chambers and valves are working. This procedure takes approximately one hour. There are no restrictions for this procedure.   Follow-Up: At [facility], you and your health needs are our priority.  As part of our continuing mission to provide you with exceptional heart care, we have created designated Provider Care Teams.  These Care Teams include your primary Cardiologist (physician) and Advanced Practice Providers (APPs -  Physician Assistants and Nurse Practitioners) who all work together to provide you with the care you need, when you need it.  We recommend signing up for the patient portal called "MyChart".  Sign up information is provided on this After Visit Summary.  MyChart is used to connect with patients for Virtual Visits (Telemedicine).  Patients are able to view lab/test results, encounter notes, upcoming appointments, etc.  Non-urgent messages can be sent to your provider as well.   To learn more about what you can do with MyChart, go to [email or web address].    Your next  appointment:   2 week(s)  The format for your next appointment:   In Person  Provider:   You may see  Dr Lovena Le  or one of the following Advanced Practice Providers on your designated Care Team:    Chanetta Marshall, NP  Tommye Standard, PA-C  Legrand Como "Oda Kilts, [place]    Other Instructions

## 2019-08-23 NOTE — Addendum Note (Signed)
Addended by: Claude Manges on: 08/23/2019 03:19 PM   Modules accepted: Orders

## 2019-09-07 ENCOUNTER — Ambulatory Visit: Payer: Medicare PPO | Admitting: Internal Medicine

## 2019-09-08 ENCOUNTER — Other Ambulatory Visit: Payer: Self-pay

## 2019-09-08 ENCOUNTER — Ambulatory Visit (HOSPITAL_COMMUNITY): Payer: Medicare PPO | Attending: Cardiovascular Disease

## 2019-09-08 DIAGNOSIS — R079 Chest pain, unspecified: Secondary | ICD-10-CM | POA: Diagnosis not present

## 2019-09-08 DIAGNOSIS — R0600 Dyspnea, unspecified: Secondary | ICD-10-CM | POA: Diagnosis not present

## 2019-09-08 DIAGNOSIS — R0609 Other forms of dyspnea: Secondary | ICD-10-CM

## 2019-09-14 ENCOUNTER — Other Ambulatory Visit: Payer: Self-pay | Admitting: *Deleted

## 2019-09-14 DIAGNOSIS — R0609 Other forms of dyspnea: Secondary | ICD-10-CM

## 2019-09-14 DIAGNOSIS — R0600 Dyspnea, unspecified: Secondary | ICD-10-CM

## 2019-09-21 ENCOUNTER — Encounter: Payer: Self-pay | Admitting: *Deleted

## 2019-09-21 ENCOUNTER — Other Ambulatory Visit: Payer: Self-pay

## 2019-09-21 ENCOUNTER — Ambulatory Visit: Payer: Medicare PPO | Admitting: Physician Assistant

## 2019-09-21 VITALS — BP 110/58 | HR 53 | Ht 70.5 in | Wt 171.0 lb

## 2019-09-21 DIAGNOSIS — I10 Essential (primary) hypertension: Secondary | ICD-10-CM | POA: Diagnosis not present

## 2019-09-21 DIAGNOSIS — R0600 Dyspnea, unspecified: Secondary | ICD-10-CM

## 2019-09-21 DIAGNOSIS — R0609 Other forms of dyspnea: Secondary | ICD-10-CM

## 2019-09-21 DIAGNOSIS — I2510 Atherosclerotic heart disease of native coronary artery without angina pectoris: Secondary | ICD-10-CM | POA: Diagnosis not present

## 2019-09-21 NOTE — Patient Instructions (Addendum)
Medication Instructions:   Your physician recommends that you continue on your current medications as directed. Please refer to the Current Medication list given to you today.  *If you need a refill on your cardiac medications before your next appointment, please call your pharmacy*   Lab Work: [place]   If you have labs (blood work) drawn today and your tests are completely normal, you will receive your results only by: [name] MyChart Message (if you have MyChart) OR . A paper copy in the mail If you have any lab test that is abnormal or we need to change your treatment, we will call you to review the results.   Testing/Procedures: Your physician has requested that you have a lexiscan myoview. For further information please visit [email or web address]. Please follow instruction sheet, as given.    Follow-Up: At [facility], you and your health needs are our priority.  As part of our continuing mission to provide you with exceptional heart care, we have created designated Provider Care Teams.  These Care Teams include your primary Cardiologist (physician) and Advanced Practice Providers (APPs -  Physician Assistants and Nurse Practitioners) who all work together to provide you with the care you need, when you need it.  We recommend signing up for the patient portal called "MyChart".  Sign up information is provided on this After Visit Summary.  MyChart is used to connect with patients for Virtual Visits (Telemedicine).  Patients are able to view lab/test results, encounter notes, upcoming appointments, etc.  Non-urgent messages can be sent to your provider as well.   To learn more about what you can do with MyChart, go to [email or web address].    Your next appointment:  WITH PULMOARY  Other Instructions  IN [place] OR DR Lovena Le

## 2019-09-21 NOTE — Progress Notes (Signed)
Cardiology Office Note Date:  09/21/2019  Patient ID:  Isaac [name] 1941/03/17, MRN [id number] PCP:  Dettinger, Fransisca Kaufmann, MD  Cardiologist:  Dr. Lovena Le    Chief Complaint: f/u on echo   History of Present Illness: Isaac [name] is a 79 y.o. male with history of CAD (CABG 2015), known 2 occl grafts, GERD, HTN, HLD, CBP, CRI (III).    He saw Dr. Lovena Le May 2018, at that time note mentions some degree of underlying lung disease, with patient c/o worsening DOE when walking inclines.   Dr. Lovena Le stated " He will undergo ongoing pulmonary eval. I am concerned that he has either developed worsening LV dysfunction or is developing ischemic induced diastolic dysfunction. I have recommended he undergo exerecise testing with a stress echo did look at the physiologic burden of his ischemia. I would envision referral for cath if he has an abnormal stress echo. I would anticipate increasing his diuretic, particularly if the stress echo is negative" PFTs were pending.  Stress echo was OK, low risk with no evidence of stress induced ischemia, LVEF 55% at rest with akinetic basalinferior wall, stress LVEF 75%, same WMA  He saw Dr. Melvyn Novas in August, no clear or ongoing pulmonary issues or recommendations  I saw him Jan 2019 He had custody of his 3 month and 4 year grandchildren reported was able to keep up with them.  He was able to chop his wood and haul/stack it, without changes in his exertional capacity, he will occasionally have to take breaks though not often.  He only reported trouble breathing 2/2 is nasal congestion and his nasal spray typically keeps this pretty well controlled.   Mentioned that he had a pneumonia in Nov 2017 and felt like his lungs were never quite the same.  No CP of any kind, no dizziness, near syncope or syncope.  No rest SOB, no symptoms of PND or orthopnea. He feels like his exertional capacity in the last year has been stable, if anything better of late. He has ongoing  groin pain with his surgery, and looking forward to his surgery and getting the hernia taken care of.  He has had subsequent visits with Dr. Lovena Le, most recently 02/02/2019.  No angina, recommended low sodium diet for class II symptoms and his HTN   He saw Dr. Warrick Parisian 08/11/2019, recommended to follow up with his nephrologist and cardiologist for increased exertional SOB in the past few months, reported stable weights at home 160-165 (169 in clinic with clothes on).  08/19/2019 I saw him, he was accompanied by his wife.  He had COVID in Jan 2021, treated outpatient via the infusion clinic, though they say they were told they wanted to admit him, but no bed was available.  He reported feeling awful for weeks and since then has just never felt well again. He had nausea and marked DOE, described his exertional capacity as terrible with marked SOB with even casual walking, and in the last month associated with CP.  Both the SOB and CP/tightness resolve after sitting, recovers in a minute or so. No near syncope or syncope. He went from shopping and stacking wood to having to rest after putting two logs in the fireplace.  His wife says he just gives out with minimal exertion. No rest SOB, no rest CP No near synope or syncope He did not appreciate any improvement with the couple days of extra lasix. Discussed with Dr. Lovena Le, recommended echo, if chnges noted plan  for cath, if stable refer to pulmonary given his symptoms started Jan post COVID.  Echo noted LVEF 55-60%, no WMA, grade I DD, normal RV size and function, normal PAP, no significant VHD > referred to pulmonary.  TODAY He is again accompanied by his wife.  He feels the same.  When asked today what makes him feel the most SOB he states walking in his driveway on an uneven surface and an incline.  He needs to stop and catch his breath, there is chest tightness, both go away with rest.  Today he says walking on flat ground does not make him SOB,  his wife mentions he walks ok on [address], he says he would like a prescription to walk around [employer]  No rest SOB or CP, no palpitations. None of his symptoms are changing or escalating, but not improving either.  His wife is concerned that he may be developing dementia "like his dad had", that oer the last year his memory is failing, forgets where he puts things particularly and in the last several weeks some hallucinations seeing the grand kids when not there.  This is rare but has happened a couple times, started several weeks ago. He has not had any other neurological complaints, AMS, weakness, visual changes, personality changes.   Past Medical History:  Diagnosis Date  . Allergic rhinitis   . Allergy   . Arthritis 06/04/2014   Trial of sulindac 05/30/14 since cri risk with other nsaids   . Barrett's esophagus 10/27/2011  . BPH (benign prostatic hyperplasia) 02/20/2014  . CAD (coronary artery disease) of artery bypass graft, Occluded VG-non dominant LCX medical therapy 11/25/2013  . CAD (coronary artery disease), native coronary artery - LIMA-LAD, SVG-Diag, SVG-RCA 2002    Catheterization 2002 showing occluded LAD, 99% diagonal, occluded distal circumflex, 90% nondominant right coronary artery  CABG 08/06/00 by Dr. Nils Pyle with LIMA to LAD, SVG to diagonal, SVG to right coronary artery, circumflex was noted to be too small to graft    . CAP (community acquired pneumonia) 04/16/2016   See cxr 04/15/16 > treated with 7 days of Levaquin 500 mg daily with resolution radiographically and clinically.  . Cataract   . Cerumen impaction 06/05/2015  . CHF (congestive heart failure), NYHA class III ([place]) 02/20/2014  . Dyspnea    with exertion   . Dyspnea on exertion 11/08/2013   Followed as Primary Care Patient/ [name]  - new onset early June 2015  - 11/08/2013  Walked RA x 3 laps @ 185 ft each stopped due to  End of study, mild sob, no cp and no desat, EKG ok  - Cardiac w/u inconclusive  but not clearly having ischemia  11/24/13   - 09/11/2016  Walked RA x 3 laps @ 185 ft each stopped due to  End of study, nl pace, no sob or desat   - Spirometry 09/11/2016    . Essential hypertension    Referred to Int med [facility] 12/12/2016   . GERD (gastroesophageal reflux disease)   . H/O hiatal hernia   . History of skin cancer    bilateral arms with removal  . Hyperlipidemia   . Hyperlipidemia LDL goal <70    Followed as Primary Care Patient/ [facility]/ [name]     - Target LDL < 70 as has IHD    . Hypertension   . Hypertensive heart disease    Followed as Primary Care Patient/ [facility]/ [name]     .  Ischemic heart disease   . Left femoral hernia s/p laparoscopic repair 05/28/2017 05/28/2017  . Lumbar disc disease   . Myocardial infarction ([place]) 2002  . Right inguinal hernia s/p laparoscopic repair 05/28/2017 05/28/2017  . Stage 3 chronic kidney disease 05/22/2015   trial off lasix/ppi/clinoril / [place] and renal eval 05/22/2015 >>>    . T wave inversion in EKG 02/20/2014  . Unstable angina ([place]) 11/23/2013    Past Surgical History:  Procedure Laterality Date  . COLONOSCOPY    . CORONARY ARTERY BYPASS GRAFT  2002   LIMA-LAD, SVG-Diag, SVG-RCA  . EYE SURGERY Bilateral 1995, 2000   ioc for cataracts  . GROIN DISSECTION  01/23/2012   Procedure: Virl Son EXPLORATION;  Surgeon: Harl Bowie, MD;  Location: [facility];  Service: General;  Laterality: Left;  Left Inguinal Exploration, release of scar tissue, Placement of Mesh  . INGUINAL HERNIA REPAIR Left 1983  . INGUINAL HERNIA REPAIR Bilateral 05/28/2017   Procedure: LAPAROSCOPIC RIGHT AND LEFT INGUINAL HERNIA REPAIR WITH MESH;  Surgeon: Michael Boston, MD;  Location: [place];  Service: General;  Laterality: Bilateral;  . INSERTION OF MESH Bilateral 05/28/2017   Procedure: INSERTION OF MESH;  Surgeon: Michael Boston, MD;  Location: [place];  Service: General;  Laterality: Bilateral;  . LEFT HEART  CATHETERIZATION WITH CORONARY ANGIOGRAM N/A 11/24/2013   Procedure: LEFT HEART CATHETERIZATION WITH CORONARY ANGIOGRAM;  Surgeon: Peter M Martinique, MD;  Location: [facility] CATH LAB;  Service: Cardiovascular;  Laterality: N/A;  . NOSE SURGERY  1985    Current Outpatient Medications  Medication Sig Dispense Refill  . aspirin (ADULT ASPIRIN EC LOW STRENGTH) 81 MG EC tablet Take 81 mg by mouth every morning.     . Cholecalciferol (VITAMIN D) 2000 UNITS tablet Take 2,000 Units by mouth daily.     [name] docusate sodium (COLACE) 100 MG capsule Take 1 capsule by mouth every other day    . doxazosin (CARDURA) 1 MG tablet Take 1 tablet (1 mg total) by mouth 2 (two) times daily. 180 tablet 3  . fluticasone (FLONASE) 50 MCG/ACT nasal spray Place 2 sprays into both nostrils daily as needed for allergies or rhinitis.     . furosemide (LASIX) 40 MG tablet Take 1 tablet (40 mg total) by mouth daily as needed for fluid or edema. 30 tablet 5  . metoprolol succinate (TOPROL-XL) 50 MG 24 hr tablet TAKE 1 TABLET BY MOUTH IN THE MORNING AND 1/2 TABLET IN THE EVENING (Patient taking differently: Take 25-50 mg by mouth See admin instructions. TAKE 50mg  BY MOUTH IN THE MORNING AND 25mg   IN THE EVENING) 135 tablet 3  . Multiple Vitamin (MULTIVITAMIN WITH MINERALS) TABS Take 1 tablet by mouth daily.     . multivitamin-lutein (OCUVITE-LUTEIN) CAPS capsule Take 1 capsule by mouth daily.    . nitroGLYCERIN (NITROSTAT) 0.4 MG SL tablet Place 1 tablet (0.4 mg total) under the tongue every 5 (five) minutes as needed for chest pain (MAX 3 TABLETS). 10 tablet 6  . omeprazole (PRILOSEC) 20 MG capsule Take 1 capsule (20 mg total) by mouth 2 (two) times daily. 180 capsule 3  . ondansetron (ZOFRAN ODT) 4 MG disintegrating tablet Take 1 tablet (4 mg total) by mouth every 8 (eight) hours as needed for nausea or vomiting. 20 tablet 0  . oxymetazoline (AFRIN) 0.05 % nasal spray Place 2 sprays into the nose every 12 (twelve) hours as needed for  congestion (x3 days). Dryness    .  potassium chloride SA (KLOR-CON M20) 20 MEQ tablet TAKE ONE TABLET BY MOUTH ONCE DAILY WHEN  YOU  TAKE LASIX (Patient taking differently: Take 20 mEq by mouth See admin instructions. Take with Lasix) 30 tablet 5  . Simethicone (GAS-X EXTRA STRENGTH) 125 MG CAPS Take by mouth as directed. Reported on 10/10/2015 one after each meal    . simvastatin (ZOCOR) 20 MG tablet Take 1 tablet (20 mg total) by mouth at bedtime. 90 tablet 3  . vitamin C (ASCORBIC ACID) 500 MG tablet Take 500 mg by mouth daily.     No current facility-administered medications for this visit.    Allergies:   Patient has no known allergies.   Social History:  The patient  reports that he has never smoked. He quit smokeless tobacco use about 5 years ago.  His smokeless tobacco use included chew. He reports that he does not drink alcohol or use drugs.   Family History:  The patient's family history includes Breast cancer in his mother; COPD in his brother; Dementia (age of onset: 67) in his father; Diabetes in his mother and sister; Hypertension in his father.  ROS:  Please see the history of present illness.   All other systems are reviewed and otherwise negative.   PHYSICAL EXAM:  VS:  BP (!) 110/58   Pulse (!) 53   Ht 5' 10.5" (1.791 m)   Wt 171 lb (77.6 kg)   SpO2 98%   BMI 24.19 kg/m  BMI: Body mass index is 24.19 kg/m. Well nourished, well developed, in no acute distress  HEENT: normocephalic, atraumatic  Neck: no JVD, carotid bruits or masses Cardiac:  RRR; no significant murmurs, no rubs, or gallops Lungs:  CTA b/l, no wheezing, rhonchi or rales  Abd: soft, nontender MS: no deformities, age appropriate atrophy Ext:  no edema, 3+ pedal and radial pulses Skin: warm and dry, no rash Neuro:  No gross focal motor deficits appreciated, no facial asymmetry, droop, very strong equally b/l UE and LE (nearly pulled me out of my chair, observed gait is steady and without  difficulty Psych: euthymic mood, full affect   EKG:   08/19/2019 SR 63, no acute or ischemic changes   09/08/2019: TTE IMPRESSIONS  1. Left ventricular ejection fraction, by estimation, is 55 to 60%. The  left ventricle has normal function. The left ventricle has no regional  wall motion abnormalities. Left ventricular diastolic parameters are  consistent with Grade I diastolic  dysfunction (impaired relaxation).  2. Right ventricular systolic function is normal. The right ventricular  size is normal. There is normal pulmonary artery systolic pressure.  3. Left atrial size was moderately dilated.  4. Right atrial size was mildly dilated.  5. The mitral valve is normal in structure. Mild mitral valve  regurgitation. No evidence of mitral stenosis.  6. The aortic valve is tricuspid. Aortic valve regurgitation is mild to  moderate. No aortic stenosis is present. Aortic regurgitation PHT measures  532 msec.  7. The inferior vena cava is normal in size with greater than 50%  respiratory variability, suggesting right atrial pressure of 3 mmHg.    10/07/16: stress echo Study Conclusions - Stress ECG conclusions: There were no stress arrhythmias or   conduction abnormalities. The stress ECG was normal. - Staged echo: There was no echocardiographic evidence for   stress-induced ischemia. Low risk. - Baseline: LV global systolic function was normal. The estimated   LV ejection fraction was 55%. Akinesis of the basalinferior LV  myocardium. - Peak stress: LV global systolic function was hyperdynamic. The   estimated LV ejection fraction was 75%. Akinesis of the basal   inferior LV myocardium.  02/21/14: TTE Study Conclusions - Left ventricle: The cavity size was normal. There was moderate concentric hypertrophy. Systolic function was normal. The estimated ejection fraction was in the range of 55% to 60%. Wall motion was normal; there were no regional wall  motion abnormalities. Doppler parameters are consistent with abnormal left ventricular relaxation (grade 1 diastolic dysfunction). The E/e&' ratio is between 8-15, suggesting indeterminate LV filling pressure. - Aortic valve: Sclerosis without stenosis. There was mild regurgitation. Regurgitation pressure half-time: 510 ms. - Mitral valve: Mildly thickened leaflets . There was mild regurgitation. - Left atrium: Moderately dilated at 45 ml/m2. - Tricuspid valve: There was mild regurgitation. - Pulmonary arteries: PA peak pressure: 35 mm Hg (S). - Inferior vena cava: The vessel was normal in size. The respirophasic diameter changes were in the normal range (>= 50%), consistent with normal central venous pressure. Impressions: - Compared to the prior echo in 10/2013, the EF is stable without clear wall motion abnormality. There is now only mild AI and MR. The LA is moderately dilated with mild TR and the RVSP is somewhat reduced.  Recent Labs: 05/20/2019: B Natriuretic Peptide 345.5 08/11/2019: ALT 9; BUN 20; Creatinine, Ser 1.44; Hemoglobin 13.2; Platelets 176; Potassium 5.1; Sodium 141  08/11/2019: Chol/HDL Ratio 3.0; Cholesterol, Total 169; HDL 56; LDL Chol Calc (NIH) 92; Triglycerides 119   CrCl cannot be calculated (Patient's most recent lab result is older than the maximum 21 days allowed.).   Wt Readings from Last 3 Encounters:  09/21/19 171 lb (77.6 kg)  08/19/19 169 lb (76.7 kg)  08/11/19 169 lb 6 oz (76.8 kg)     Other studies reviewed: Additional studies/records reviewed today include: summarized above  ASSESSMENT AND PLAN:  1. CAD     On ASA, BB, statin tx     Stress echo in may 2018 was ok     Echo recently with preserved LVEF, no WMA  Suspect his symptoms post COVID related given that was the onset, though today he describes similar to what he mentioned back in 2018? His wife reports though a clear change in the last 6 mo and since his COVID  infection  Will plan lexiscan stress test I reached out to Pulmonary, they were able to get him an appt w/Dr. Carlis Abbott 11/01/19, this was the earliest they had, unable to see an APP given has been years since he has been there  2. HTN      No changes, looks  3. HLD     Monitored and managed with his PMD     Not addressed today  4. Hx of diastolic dysfunction     Grade I by last echo     No exam findings to suggest fluid OL     Weight is stable from Oct, and reportedly at home as well  5. Declining memory in the last year particularly     Hallucinations in the last several weeks     neither are acutely new, no other neuro complaints no focal findings today     Recommend a neurological evaluation, they tell me that their PMD office is very responsive and she will call today to be seen.   Disposition: will see him back after he has seen Pulmonary, sooner if needed, pending his stress test result.   Current medicines are reviewed at  length with the patient today.  The patient did not have any concerns regarding medicines.  Venetia Night, PA-C 09/21/2019 5:37 PM     [address] [place] [id number] [phone] (office)  [phone] (fax)

## 2019-09-27 ENCOUNTER — Telehealth (HOSPITAL_COMMUNITY): Payer: Self-pay | Admitting: *Deleted

## 2019-09-27 ENCOUNTER — Encounter: Payer: Self-pay | Admitting: Nurse Practitioner

## 2019-09-27 ENCOUNTER — Other Ambulatory Visit: Payer: Self-pay

## 2019-09-27 ENCOUNTER — Ambulatory Visit (INDEPENDENT_AMBULATORY_CARE_PROVIDER_SITE_OTHER): Payer: Medicare PPO | Admitting: Nurse Practitioner

## 2019-09-27 VITALS — BP 165/65 | HR 59 | Temp 96.8°F | Ht 70.5 in | Wt 170.8 lb

## 2019-09-27 DIAGNOSIS — R443 Hallucinations, unspecified: Secondary | ICD-10-CM | POA: Diagnosis not present

## 2019-09-27 LAB — URINALYSIS, COMPLETE
Bilirubin, UA: NEGATIVE
Glucose, UA: NEGATIVE
Leukocytes,UA: NEGATIVE
Nitrite, UA: NEGATIVE
RBC, UA: NEGATIVE
Specific Gravity, UA: 1.02 (ref 1.005–1.030)
Urobilinogen, Ur: 0.2 mg/dL (ref 0.2–1.0)
pH, UA: 6.5 (ref 5.0–7.5)

## 2019-09-27 LAB — MICROSCOPIC EXAMINATION: Renal Epithel, UA: NONE SEEN /hpf

## 2019-09-27 NOTE — Progress Notes (Signed)
Acute Office Visit  Subjective:    Patient ID: Isaac [name], male    DOB: May 15, 1940, 79 y.o.   MRN: [id number]  Chief Complaint  Patient presents with  . Hallucinations    HPI Patient is in today for hallucination.  Patient is experiencing increased memory loss per spouse this is not new for patient it has been an ongoing problem in the last few months, after patient recovered from Covid-19.  Spouse reports that patient has been "seeing things" last week patient reports seeing children playing in the yard but no children were actually playing outside.  Patient is reporting increased fatigue, nausea, shortness of breath, and moving more slowly.  Patient has had multiple falls in the last 2 weeks.  Patient is not reporting pain, vomiting, fever, headache or dizziness.  Past Medical History:  Diagnosis Date  . Allergic rhinitis   . Allergy   . Arthritis 06/04/2014   Trial of sulindac 05/30/14 since cri risk with other nsaids   . Barrett's esophagus 10/27/2011  . BPH (benign prostatic hyperplasia) 02/20/2014  . CAD (coronary artery disease) of artery bypass graft, Occluded VG-non dominant LCX medical therapy 11/25/2013  . CAD (coronary artery disease), native coronary artery - LIMA-LAD, SVG-Diag, SVG-RCA 2002    Catheterization 2002 showing occluded LAD, 99% diagonal, occluded distal circumflex, 90% nondominant right coronary artery  CABG 08/06/00 by Dr. Nils Pyle with LIMA to LAD, SVG to diagonal, SVG to right coronary artery, circumflex was noted to be too small to graft    . CAP (community acquired pneumonia) 04/16/2016   See cxr 04/15/16 > treated with 7 days of Levaquin 500 mg daily with resolution radiographically and clinically.  . Cataract   . Cerumen impaction 06/05/2015  . CHF (congestive heart failure), NYHA class III ([place]) 02/20/2014  . Dyspnea    with exertion   . Dyspnea on exertion 11/08/2013   Followed as Primary Care Patient/ [facility]/ [name]  - new onset early June  2015  - 11/08/2013  Walked RA x 3 laps @ 185 ft each stopped due to  End of study, mild sob, no cp and no desat, EKG ok  - Cardiac w/u inconclusive but not clearly having ischemia  11/24/13   - 09/11/2016  Walked RA x 3 laps @ 185 ft each stopped due to  End of study, nl pace, no sob or desat   - Spirometry 09/11/2016    . Essential hypertension    Referred to Int med [facility] 12/12/2016   . GERD (gastroesophageal reflux disease)   . H/O hiatal hernia   . History of skin cancer    bilateral arms with removal  . Hyperlipidemia   . Hyperlipidemia LDL goal <70    Followed as Primary Care Patient/ [facility]/ [name]     - Target LDL < 70 as has IHD    . Hypertension   . Hypertensive heart disease    Followed as Primary Care Patient/ [facility]/ [name]     . Ischemic heart disease   . Left femoral hernia s/p laparoscopic repair 05/28/2017 05/28/2017  . Lumbar disc disease   . Myocardial infarction ([place]) 2002  . Right inguinal hernia s/p laparoscopic repair 05/28/2017 05/28/2017  . Stage 3 chronic kidney disease 05/22/2015   trial off lasix/ppi/clinoril / [place] and renal eval 05/22/2015 >>>    . T wave inversion in EKG 02/20/2014  . Unstable angina ([place]) 11/23/2013    Past Surgical History:  Procedure Laterality Date  .  COLONOSCOPY    . CORONARY ARTERY BYPASS GRAFT  2000/06/24   LIMA-LAD, SVG-Diag, SVG-RCA  . EYE SURGERY Bilateral 1995, 2000   ioc for cataracts  . GROIN DISSECTION  01/23/2012   Procedure: Virl Son EXPLORATION;  Surgeon: Harl Bowie, MD;  Location: [facility];  Service: General;  Laterality: Left;  Left Inguinal Exploration, release of scar tissue, Placement of Mesh  . INGUINAL HERNIA REPAIR Left 1983  . INGUINAL HERNIA REPAIR Bilateral 05/28/2017   Procedure: LAPAROSCOPIC RIGHT AND LEFT INGUINAL HERNIA REPAIR WITH MESH;  Surgeon: Michael Boston, MD;  Location: [place];  Service: General;  Laterality: Bilateral;  . INSERTION OF MESH Bilateral 05/28/2017    Procedure: INSERTION OF MESH;  Surgeon: Michael Boston, MD;  Location: [place];  Service: General;  Laterality: Bilateral;  . LEFT HEART CATHETERIZATION WITH CORONARY ANGIOGRAM N/A 11/24/2013   Procedure: LEFT HEART CATHETERIZATION WITH CORONARY ANGIOGRAM;  Surgeon: Peter M Martinique, MD;  Location: [facility] CATH LAB;  Service: Cardiovascular;  Laterality: N/A;  . NOSE SURGERY  1985    Family History  Problem Relation Age of Onset  . Breast cancer Mother   . Diabetes Mother   . Hypertension Father   . Dementia Father 33       early 16s  . Diabetes Sister   . COPD Brother   . Colon cancer Neg Hx   . Esophageal cancer Neg Hx   . Rectal cancer Neg Hx   . Stomach cancer Neg Hx     Social History   Socioeconomic History  . Marital status: Married    Spouse name: [name]  . Number of children: 0  . Years of education: Not on file  . Highest education level: 11th grade  Occupational History  . Occupation: Retired    Fish farm manager: [employer]  Tobacco Use  . Smoking status: Never Smoker  . Smokeless tobacco: Former Systems developer    Types: Chew  Substance and Sexual Activity  . Alcohol use: No    Alcohol/week: 0.0 standard drinks  . Drug use: No  . Sexual activity: Never  Other Topics Concern  . Not on file  Social History Narrative   Remarried in June 25, 2003 after first wife died of emphysema in June 24, 2001. 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 He lives in a one story home with his wife. They raising 2 great grandchildren. One is 45 months old and the other is 79 years old. He has no biological children.    Social Determinants of Health   Financial Resource Strain:   . Difficulty of Paying Living Expenses:   Food Insecurity:   . Worried About Charity fundraiser in the Last Year:   . Arboriculturist in the Last Year:   Transportation Needs:   . Film/video editor (Medical):   [name]  Lack of Transportation (Non-Medical):   Physical Activity:   . Days of Exercise per Week:   . Minutes of Exercise per Session:   Stress:   . Feeling of Stress :   Social Connections:   . Frequency of Communication with Friends and Family:   . Frequency of Social Gatherings with Friends and Family:   . Attends Religious Services:   . Active Member of Clubs or Organizations:   . Attends Archivist Meetings:   [name] Marital Status:   Intimate Partner Violence:   . Fear of  Current or Ex-Partner:   . Emotionally Abused:   [name] Physically Abused:   . Sexually Abused:     Outpatient Medications Prior to Visit  Medication Sig Dispense Refill  . aspirin (ADULT ASPIRIN EC LOW STRENGTH) 81 MG EC tablet Take 81 mg by mouth every morning.     . Cholecalciferol (VITAMIN D) 2000 UNITS tablet Take 2,000 Units by mouth daily.     [name] docusate sodium (COLACE) 100 MG capsule Take 1 capsule by mouth every other day    . doxazosin (CARDURA) 1 MG tablet Take 1 tablet (1 mg total) by mouth 2 (two) times daily. 180 tablet 3  . fluticasone (FLONASE) 50 MCG/ACT nasal spray Place 2 sprays into both nostrils daily as needed for allergies or rhinitis.     . furosemide (LASIX) 40 MG tablet Take 1 tablet (40 mg total) by mouth daily as needed for fluid or edema. 30 tablet 5  . metoprolol succinate (TOPROL-XL) 50 MG 24 hr tablet TAKE 1 TABLET BY MOUTH IN THE MORNING AND 1/2 TABLET IN THE EVENING (Patient taking differently: Take 25-50 mg by mouth See admin instructions. TAKE 6m BY MOUTH IN THE MORNING AND 281m IN THE EVENING) 135 tablet 3  . Multiple Vitamin (MULTIVITAMIN WITH MINERALS) TABS Take 1 tablet by mouth daily.     . multivitamin-lutein (OCUVITE-LUTEIN) CAPS capsule Take 1 capsule by mouth daily.    . nitroGLYCERIN (NITROSTAT) 0.4 MG SL tablet Place 1 tablet (0.4 mg total) under the tongue every 5 (five) minutes as needed for chest pain (MAX 3 TABLETS). 10 tablet 6  . omeprazole (PRILOSEC) 20 MG  capsule Take 1 capsule (20 mg total) by mouth 2 (two) times daily. 180 capsule 3  . ondansetron (ZOFRAN ODT) 4 MG disintegrating tablet Take 1 tablet (4 mg total) by mouth every 8 (eight) hours as needed for nausea or vomiting. 20 tablet 0  . oxymetazoline (AFRIN) 0.05 % nasal spray Place 2 sprays into the nose every 12 (twelve) hours as needed for congestion (x3 days). Dryness    . potassium chloride SA (KLOR-CON M20) 20 MEQ tablet TAKE ONE TABLET BY MOUTH ONCE DAILY WHEN  YOU  TAKE LASIX (Patient taking differently: Take 20 mEq by mouth See admin instructions. Take with Lasix) 30 tablet 5  . Simethicone (GAS-X EXTRA STRENGTH) 125 MG CAPS Take by mouth as directed. Reported on 10/10/2015 one after each meal    . simvastatin (ZOCOR) 20 MG tablet Take 1 tablet (20 mg total) by mouth at bedtime. 90 tablet 3  . vitamin C (ASCORBIC ACID) 500 MG tablet Take 500 mg by mouth daily.     No facility-administered medications prior to visit.   MMSE - Mini Mental State Exam 09/27/2019 05/19/2017  Orientation to time 5 5  Orientation to Place 5 5  Registration 3 3  Attention/ Calculation 2 5  Recall 1 1  Language- name 2 objects 2 2  Language- repeat 1 1  Language- follow 3 step command 3 3  Language- read & follow direction 1 1  Write a sentence 1 1  Copy design 0 1  Total score 24 28    Review of Systems  Constitutional: Negative for appetite change.  HENT: Negative.   Eyes: Negative.   Respiratory: Positive for shortness of breath. Negative for chest tightness.   Cardiovascular: Negative for chest pain.  Gastrointestinal: Positive for abdominal distention and nausea. Negative for abdominal pain and vomiting.  Endocrine: Negative.   Genitourinary: Positive  for difficulty urinating. Negative for dysuria.  Musculoskeletal: Negative for arthralgias and myalgias.  Skin: Negative for pallor and rash.  Psychiatric/Behavioral: Positive for hallucinations.       Objective:    Physical  Exam Constitutional:      Appearance: Normal appearance.  HENT:     Head: Normocephalic.     Mouth/Throat:     Mouth: Mucous membranes are moist.  Eyes:     Conjunctiva/sclera: Conjunctivae normal.  Cardiovascular:     Rate and Rhythm: Normal rate and regular rhythm.     Pulses: Normal pulses.     Heart sounds: Normal heart sounds.  Pulmonary:     Effort: Pulmonary effort is normal.     Breath sounds: Normal breath sounds.  Abdominal:     General: Bowel sounds are normal.  Musculoskeletal:        General: Tenderness present.     Comments: Frequent falls  Skin:    General: Skin is warm.     Findings: No erythema or rash.  Neurological:     Mental Status: He is alert.     Comments: Hallucination, Memory Loss     BP (!) 165/65   Pulse (!) 59   Temp (!) 96.8 F (36 C) (Temporal)   Ht 5' 10.5" (1.791 m)   Wt 170 lb 12.8 oz (77.5 kg)   BMI 24.16 kg/m  Wt Readings from Last 3 Encounters:  09/27/19 170 lb 12.8 oz (77.5 kg)  09/21/19 171 lb (77.6 kg)  08/19/19 169 lb (76.7 kg)    Health Maintenance Due  Topic Date Due  . COVID-19 Vaccine (1) Never done      Lab Results  Component Value Date   TSH 2.36 09/11/2016   Lab Results  Component Value Date   WBC 8.3 08/11/2019   HGB 13.2 08/11/2019   HCT 40.0 08/11/2019   MCV 94 08/11/2019   PLT 176 08/11/2019   Lab Results  Component Value Date   NA 141 08/11/2019   K 5.1 08/11/2019   CO2 21 08/11/2019   GLUCOSE 103 (H) 08/11/2019   BUN 20 08/11/2019   CREATININE 1.44 (H) 08/11/2019   BILITOT 0.4 08/11/2019   ALKPHOS 81 08/11/2019   AST 19 08/11/2019   ALT 9 08/11/2019   PROT 7.4 08/11/2019   ALBUMIN 4.5 08/11/2019   CALCIUM 9.2 08/11/2019   ANIONGAP 12 05/20/2019   GFR 44.60 (L) 09/11/2016   Lab Results  Component Value Date   CHOL 169 08/11/2019   Lab Results  Component Value Date   HDL 56 08/11/2019   Lab Results  Component Value Date   LDLCALC 92 08/11/2019   Lab Results  Component  Value Date   TRIG 119 08/11/2019   Lab Results  Component Value Date   CHOLHDL 3.0 08/11/2019   No results found for: HGBA1C     Assessment & Plan:   Problem List Items Addressed This Visit      Other   Hallucination - Primary    Patient is a 79 year old male who presents to clinic today with hallucination, and memory loss.  Patient has had this problem ongoing for 3 months and spouse is reporting that patient  is "seeing things".  Last episode happened when patient reports that children were playing in the yard when there were no children present.  Provided education to spouse and patient with printed hand out given to patient., MMSE screening completed, UA completed, labs drawn today includes B12, CBC, CMP, TSH,  RPR, HIV.  MRI ordered. Waiting for test results, patient will follow up with worsening or unresolved problem.      Relevant Orders   Urinalysis, Complete   CBC with Differential   CMP14+EGFR   TSH   Vitamin B12   RPR   HIV antibody (with reflex)   MR Brain W Wo Contrast          Ivy Lynn, NP

## 2019-09-27 NOTE — Assessment & Plan Note (Signed)
Patient is a 79 year old male who presents to clinic today with hallucination, and memory loss.  Patient has had this problem ongoing for 3 months and spouse is reporting that patient  is "seeing things".  Last episode happened when patient reports that children were playing in the yard when there were no children present.  Provided education to spouse and patient with printed hand out given to patient., MMSE screening completed, UA completed, labs drawn today includes B12, CBC, CMP, TSH, RPR, HIV.  MRI ordered. Waiting for test results, patient will follow up with worsening or unresolved problem.

## 2019-09-27 NOTE — Telephone Encounter (Signed)
Patient given detailed instructions per Myocardial Perfusion Study Information Sheet for the test on 09/28/19 Patient notified to arrive 15 minutes early and that it is imperative to arrive on time for appointment to keep from having the test rescheduled.  If you need to cancel or reschedule your appointment, please call the office within 24 hours of your appointment. Kirstie Peri

## 2019-09-27 NOTE — Patient Instructions (Addendum)
Hallucination Patient is a 79 year old male who presents to clinic today with hallucination, and memory loss.  Patient has had this problem ongoing for 3 months and spouse is reporting that patient  is "seeing things".  Last episode happened when patient reports that children were playing in the yard when there were no children present.  Provided education to spouse and patient with printed hand out given to patient., MMSE screening completed, UA completed, labs drawn today includes B12, CBC, CMP, TSH, RPR, HIV.  MRI ordered. Waiting for test results, patient will follow up with worsening or unresolved problem.    Fall Prevention in the Home, Adult Falls can cause injuries. They can happen to people of all ages. There are many things you can do to make your home safe and to help prevent falls. Ask for help when making these changes, if needed. What actions can I take to prevent falls? General Instructions  Use good lighting in all rooms. Replace any light bulbs that burn out.  Turn on the lights when you go into a dark area. Use night-lights.  Keep items that you use often in easy-to-reach places. Lower the shelves around your home if necessary.  Set up your furniture so you have a clear path. Avoid moving your furniture around.  Do not have throw rugs and other things on the floor that can make you trip.  Avoid walking on wet floors.  If any of your floors are uneven, fix them.  Add color or contrast paint or tape to clearly mark and help you see: ? Any grab bars or handrails. ? First and last steps of stairways. ? Where the edge of each step is.  If you use a stepladder: ? Make sure that it is fully opened. Do not climb a closed stepladder. ? Make sure that both sides of the stepladder are locked into place. ? Ask someone to hold the stepladder for you while you use it.  If there are any pets around you, be aware of where they are. What can I do in the bathroom?      Keep  the floor dry. Clean up any water that spills onto the floor as soon as it happens.  Remove soap buildup in the tub or shower regularly.  Use non-skid mats or decals on the floor of the tub or shower.  Attach bath mats securely with double-sided, non-slip rug tape.  If you need to sit down in the shower, use a plastic, non-slip stool.  Install grab bars by the toilet and in the tub and shower. Do not use towel bars as grab bars. What can I do in the bedroom?  Make sure that you have a light by your bed that is easy to reach.  Do not use any sheets or blankets that are too big for your bed. They should not hang down onto the floor.  Have a firm chair that has side arms. You can use this for support while you get dressed. What can I do in the kitchen?  Clean up any spills right away.  If you need to reach something above you, use a strong step stool that has a grab bar.  Keep electrical cords out of the way.  Do not use floor polish or wax that makes floors slippery. If you must use wax, use non-skid floor wax. What can I do with my stairs?  Do not leave any items on the stairs.  Make sure that you have a  light switch at the top of the stairs and the bottom of the stairs. If you do not have them, ask someone to add them for you.  Make sure that there are handrails on both sides of the stairs, and use them. Fix handrails that are broken or loose. Make sure that handrails are as long as the stairways.  Install non-slip stair treads on all stairs in your home.  Avoid having throw rugs at the top or bottom of the stairs. If you do have throw rugs, attach them to the floor with carpet tape.  Choose a carpet that does not hide the edge of the steps on the stairway.  Check any carpeting to make sure that it is firmly attached to the stairs. Fix any carpet that is loose or worn. What can I do on the outside of my home?  Use bright outdoor lighting.  Regularly fix the edges of  walkways and driveways and fix any cracks.  Remove anything that might make you trip as you walk through a door, such as a raised step or threshold.  Trim any bushes or trees on the path to your home.  Regularly check to see if handrails are loose or broken. Make sure that both sides of any steps have handrails.  Install guardrails along the edges of any raised decks and porches.  Clear walking paths of anything that might make someone trip, such as tools or rocks.  Have any leaves, snow, or ice cleared regularly.  Use sand or salt on walking paths during winter.  Clean up any spills in your garage right away. This includes grease or oil spills. What other actions can I take?  Wear shoes that: ? Have a low heel. Do not wear high heels. ? Have rubber bottoms. ? Are comfortable and fit you well. ? Are closed at the toe. Do not wear open-toe sandals.  Use tools that help you move around (mobility aids) if they are needed. These include: ? Canes. ? Walkers. ? Scooters. ? Crutches.  Review your medicines with your doctor. Some medicines can make you feel dizzy. This can increase your chance of falling. Ask your doctor what other things you can do to help prevent falls. Where to find more information  Centers for Disease Control and Prevention, [facility]: https://garcia.biz/  [employer] on Aging: BrainJudge.co.uk Contact a doctor if:  You are afraid of falling at home.  You feel weak, drowsy, or dizzy at home.  You fall at home. Summary  There are many simple things that you can do to make your home safe and to help prevent falls.  Ways to make your home safe include removing tripping hazards and installing grab bars in the bathroom.  Ask for help when making these changes in your home. This information is not intended to replace advice given to you by your health care provider. Make sure you discuss any questions you have with your health care  provider. Document Revised: 08/05/2018 Document Reviewed: 11/27/2016 Elsevier Patient Education  [place].  Psychosis Psychosis, also called thought disturbance, refers to a severe loss of contact with reality. People having a psychotic episode are not able to think clearly, and their emotions and responses do not match with what is actually happening. People having a psychotic episode may have false beliefs about what is happening or who they are (delusions). They may see, hear, taste, smell, or feel things that are not present (hallucinations). They may also be very upset (  agitated), have chaotic behavior, or be very quiet and withdrawn. What are the causes? This condition may be caused by:  Very serious mental health (psychiatric) conditions such as schizophrenia, bipolar disorder, or major depression.  Use of drugs such as hallucinogens or alcohol.  Medical conditions such as delirium or neurological disorders. What are the signs or symptoms? Symptoms of this condition include:  Delusions, such as: ? Feeling a lot of fear or suspicion (paranoia). ? Believing something that is odd, unrealistic, or false, such as believing that you are someone else.  Hallucinations, such as: ? Hearing or seeing things, smelling odors, experiencing tastes, or feeling bodily sensations. ? Command hallucinations that direct you to do something that could be dangerous.  Disorganized thinking, such as thoughts that jump from one idea to another in a way that does not make sense.  Disorganized speech, such as saying things that do not make sense, echoing others, or using words based on their sound rather than their meaning.  Inappropriate behavior, such as talking to yourself, showing a clear increase or decrease in activity, or intruding on unfamiliar people. How is this diagnosed? This condition is diagnosed based on an assessment by a health care provider.  The health care provider may ask  questions about: ? Your thoughts, feelings, and behavior. ? Any medical conditions you have. ? Any use of alcohol or drugs.  One or more of the following may also be done: ? A physical exam. ? Blood tests. ? Brain imaging, such as a CT scan or MRI. ? A brain wave study (electroencephalogram, or EEG). The health care provider may refer you to a mental health professional for further tests. How is this treated? Treatment for this condition may depend on the cause of the psychosis. Treatment may include one or more of the following:  Supportive care and monitoring in the emergency room or hospital. You may need to stay in the hospital if you are a danger to yourself or others.  Taking antipsychotic medicines to reduce symptoms and to balance chemicals in the brain.  Treating an underlying medical condition.  Stopping or reducing drugs that are causing psychosis.  Therapy and other supportive programs, such as: ? Ongoing treatment and care from a mental health professional. ? Individual or family therapy. ? Training to learn new skills to cope with the psychosis and prevent further episodes. Follow these instructions at home:  Take over-the-counter and prescription medicines only as told by your health care provider.  Consult a health care provider before taking over-the-counter medicines, herbs, or supplements.  Surround yourself with people who care about you and can help manage your condition.  Keep stress under control. Stress may trigger psychosis and make symptoms worse.  Maintain a healthy lifestyle. This includes: ? Eating a healthy diet. ? Getting enough sleep. ? Exercising regularly. ? Avoiding alcohol, nicotine, and recreational drugs.  Keep all follow-up visits as told by your health care provider. This is important. Contact a health care provider if:  Medicines do not seem to be helping.  You or others notice that you: ? Continue to see, smell, or feel things  that are not there. ? Hear voices telling you to do things. ? Feel extremely fearful and suspicious that someone or something will harm you. ? Feel unable to leave your house. ? Have trouble taking care of yourself.  You have side effects of medicines, such as: ? Changes in sleep patterns. ? Dizziness. ? Weight gain. ? Restlessness. ?  Movement changes. ? Shaking that you cannot control (tremors). Get help right away if:  You have serious side effects of medicine, such as: ? Swelling of the face, lips, tongue, or throat. ? Fever, confusion, muscle spasms, or seizures.  You have serious thoughts about harming yourself or hurting others. If you ever feel like you may hurt yourself or others, or have thoughts about taking your own life, get help right away. You can go to your nearest emergency department or call:  Your local emergency services (911 in the [place]  A suicide crisis helpline, such as the [facility] at (203) 806-0004. This is open 24 hours a day. Summary  Psychosis refers to a severe loss of contact with reality. People having a psychotic episode are not able to think clearly, and they may have delusions or hallucinations.  Psychosis is a serious medical condition that should be treated by a medical professional as soon as possible. Being checked and treated right away can stop or reduce symptoms. This prevents more serious problems from developing.  In some cases, treatment may include taking antipsychotic medicines to reduce symptoms and to balance chemicals in the brain.  Support programs may help you learn new skills to cope with the psychosis and prevent further episodes. This information is not intended to replace advice given to you by your health care provider. Make sure you discuss any questions you have with your health care provider. Document Revised: 06/26/2017 Document Reviewed: 05/26/2017 Elsevier Patient Education  2020 [employer].

## 2019-09-28 ENCOUNTER — Ambulatory Visit (HOSPITAL_COMMUNITY): Payer: Medicare PPO | Attending: Cardiovascular Disease

## 2019-09-28 DIAGNOSIS — R0600 Dyspnea, unspecified: Secondary | ICD-10-CM | POA: Diagnosis not present

## 2019-09-28 DIAGNOSIS — R0609 Other forms of dyspnea: Secondary | ICD-10-CM

## 2019-09-28 LAB — CBC WITH DIFFERENTIAL/PLATELET
Basophils Absolute: 0.1 10*3/uL (ref 0.0–0.2)
Basos: 1 %
EOS (ABSOLUTE): 0.4 10*3/uL (ref 0.0–0.4)
Eos: 6 %
Hematocrit: 36.6 % — ABNORMAL LOW (ref 37.5–51.0)
Hemoglobin: 12.7 g/dL — ABNORMAL LOW (ref 13.0–17.7)
Immature Grans (Abs): 0 10*3/uL (ref 0.0–0.1)
Immature Granulocytes: 0 %
Lymphocytes Absolute: 2.1 10*3/uL (ref 0.7–3.1)
Lymphs: 28 %
MCH: 31.7 pg (ref 26.6–33.0)
MCHC: 34.7 g/dL (ref 31.5–35.7)
MCV: 91 fL (ref 79–97)
Monocytes Absolute: 0.6 10*3/uL (ref 0.1–0.9)
Monocytes: 9 %
Neutrophils Absolute: 4.2 10*3/uL (ref 1.4–7.0)
Neutrophils: 56 %
Platelets: 162 10*3/uL (ref 150–450)
RBC: 4.01 x10E6/uL — ABNORMAL LOW (ref 4.14–5.80)
RDW: 13.1 % (ref 11.6–15.4)
WBC: 7.4 10*3/uL (ref 3.4–10.8)

## 2019-09-28 LAB — CMP14+EGFR
ALT: 9 IU/L (ref 0–44)
AST: 16 IU/L (ref 0–40)
Albumin/Globulin Ratio: 1.2 (ref 1.2–2.2)
Albumin: 3.8 g/dL (ref 3.7–4.7)
Alkaline Phosphatase: 69 IU/L (ref 48–121)
BUN/Creatinine Ratio: 12 (ref 10–24)
BUN: 19 mg/dL (ref 8–27)
Bilirubin Total: 0.3 mg/dL (ref 0.0–1.2)
CO2: 24 mmol/L (ref 20–29)
Calcium: 9.1 mg/dL (ref 8.6–10.2)
Chloride: 103 mmol/L (ref 96–106)
Creatinine, Ser: 1.55 mg/dL — ABNORMAL HIGH (ref 0.76–1.27)
GFR calc Af Amer: 49 mL/min/{1.73_m2} — ABNORMAL LOW (ref >59)
GFR calc non Af Amer: 42 mL/min/{1.73_m2} — ABNORMAL LOW (ref >59)
Globulin, Total: 3.1 g/dL (ref 1.5–4.5)
Glucose: 97 mg/dL (ref 65–99)
Potassium: 4.2 mmol/L (ref 3.5–5.2)
Sodium: 141 mmol/L (ref 134–144)
Total Protein: 6.9 g/dL (ref 6.0–8.5)

## 2019-09-28 LAB — MYOCARDIAL PERFUSION IMAGING
LV dias vol: 115 mL (ref 62–150)
LV sys vol: 54 mL
Peak HR: 83 {beats}/min
Rest HR: 60 {beats}/min
SDS: 0
SRS: 4
SSS: 4
TID: 1.02

## 2019-09-28 LAB — HIV ANTIBODY (ROUTINE TESTING W REFLEX): HIV Screen 4th Generation wRfx: NONREACTIVE

## 2019-09-28 LAB — TSH: TSH: 2.78 u[IU]/mL (ref 0.450–4.500)

## 2019-09-28 LAB — VITAMIN B12: Vitamin B-12: 446 pg/mL (ref 232–1245)

## 2019-09-28 LAB — RPR: RPR Ser Ql: NONREACTIVE

## 2019-09-28 MED ORDER — REGADENOSON 0.4 MG/5ML IV SOLN
0.4000 mg | Freq: Once | INTRAVENOUS | Status: AC
  Administered 2019-09-28: 0.4 mg via INTRAVENOUS

## 2019-09-28 MED ORDER — TECHNETIUM TC 99M TETROFOSMIN IV KIT
31.7000 | PACK | Freq: Once | INTRAVENOUS | Status: AC | PRN
  Administered 2019-09-28: 31.7 via INTRAVENOUS
  Filled 2019-09-28: qty 32

## 2019-09-28 MED ORDER — TECHNETIUM TC 99M TETROFOSMIN IV KIT
10.9000 | PACK | Freq: Once | INTRAVENOUS | Status: AC | PRN
  Administered 2019-09-28: 10.9 via INTRAVENOUS
  Filled 2019-09-28: qty 11

## 2019-10-19 ENCOUNTER — Ambulatory Visit (HOSPITAL_COMMUNITY)
Admission: RE | Admit: 2019-10-19 | Discharge: 2019-10-19 | Disposition: A | Discharge status: Home / Self Care | Payer: Medicare PPO | Source: Ambulatory Visit | Attending: Nurse Practitioner | Admitting: Nurse Practitioner

## 2019-10-19 ENCOUNTER — Other Ambulatory Visit: Payer: Self-pay

## 2019-10-19 DIAGNOSIS — R443 Hallucinations, unspecified: Secondary | ICD-10-CM | POA: Insufficient documentation

## 2019-10-19 MED ORDER — GADOBUTROL 1 MMOL/ML IV SOLN
10.0000 mL | Freq: Once | INTRAVENOUS | Status: AC | PRN
  Administered 2019-10-19: 10 mL via INTRAVENOUS

## 2019-11-01 ENCOUNTER — Encounter: Payer: Self-pay | Admitting: Critical Care Medicine

## 2019-11-01 ENCOUNTER — Ambulatory Visit: Payer: Medicare PPO | Admitting: Critical Care Medicine

## 2019-11-01 ENCOUNTER — Other Ambulatory Visit: Payer: Self-pay

## 2019-11-01 VITALS — BP 140/70 | HR 65 | Temp 98.1°F | Ht 70.5 in | Wt 169.0 lb

## 2019-11-01 DIAGNOSIS — Z8616 Personal history of COVID-19: Secondary | ICD-10-CM | POA: Diagnosis not present

## 2019-11-01 DIAGNOSIS — R0600 Dyspnea, unspecified: Secondary | ICD-10-CM

## 2019-11-01 DIAGNOSIS — R0609 Other forms of dyspnea: Secondary | ICD-10-CM

## 2019-11-01 MED ORDER — ALBUTEROL SULFATE HFA 108 (90 BASE) MCG/ACT IN AERS: 2.0000 | INHALATION_SPRAY | Freq: Four times a day (QID) | RESPIRATORY_TRACT | 11 refills | Status: DC | PRN

## 2019-11-01 MED ORDER — FLUTICASONE-SALMETEROL 250-50 MCG/DOSE IN AEPB: 1.0000 | INHALATION_SPRAY | Freq: Two times a day (BID) | RESPIRATORY_TRACT | 11 refills | Status: DC

## 2019-11-01 NOTE — Progress Notes (Signed)
Synopsis: Referred in July 2021 for DOE by Isaac Jamaica, PA-C.  Previously a patient of Dr. Melvyn [name].  Subjective:   PATIENT ID: Isaac [name] GENDER: male DOB: 1941/04/09, MRN: [id number]  Chief Complaint  Patient presents with   Consult    Shortness of breath with exertion.Patient had Covid in Jan. Denies cough.    Isaac [name] is a 79 year old gentleman who presents for evaluation of dyspnea on exertion.  He is accompanied by his wife.  His dyspnea on exertion began when he had Covid in January 2021, and has persisted.  He denies coughing, wheezing.  He has a tightness sensation that goes around his chest with exertion, which improves with rest.  He is only able to walk 200 to 300 feet at a time right now, but last summer could walk 1.5 miles.  Nothing improves or worsens his dyspnea. He is unable to relate if the symptoms are similar to the symptoms he had with CAD prior to having his CABG.  His wife endorses that he sleeps more often since he had Covid.  He has no previous history of lung disease or family history of lung disease.  He quit smoking about 30 years ago, and only ever smoked socially, infrequently.  He is not on inhalers.  He has occasional heartburn and nausea after eating, but has a good appetite and his weight has been stable.    Past Medical History:  Diagnosis Date   Allergic rhinitis    Allergy    Arthritis 06/04/2014   Trial of sulindac 05/30/14 since cri risk with other nsaids    Barrett's esophagus 10/27/2011   BPH (benign prostatic hyperplasia) 02/20/2014   CAD (coronary artery disease) of artery bypass graft, Occluded VG-non dominant LCX medical therapy 11/25/2013   CAD (coronary artery disease), native coronary artery - LIMA-LAD, SVG-Diag, SVG-RCA 2002    Catheterization 2002 showing occluded LAD, 99% diagonal, occluded distal circumflex, 90% nondominant right coronary artery  CABG 08/06/00 by Dr. Nils [name] with LIMA to LAD, SVG to diagonal, SVG to right  coronary artery, circumflex was noted to be too small to graft     CAP (community acquired pneumonia) 04/16/2016   See cxr 04/15/16 > treated with 7 days of Levaquin 500 mg daily with resolution radiographically and clinically.   Cataract    Cerumen impaction 06/05/2015   CHF (congestive heart failure), NYHA class III ([place]) 02/20/2014   Dyspnea    with exertion    Dyspnea on exertion 11/08/2013   Followed as Primary Care Patient/ [facility]/ [name]  - new onset early June 2015  - 11/08/2013  Walked RA x 3 laps @ 185 ft each stopped due to  End of study, mild sob, no cp and no desat, EKG ok  - Cardiac w/u inconclusive but not clearly having ischemia  11/24/13   - 09/11/2016  Walked RA x 3 laps @ 185 ft each stopped due to  End of study, nl pace, no sob or desat   - Spirometry 09/11/2016     Essential hypertension    Referred to Int med [place] 12/12/2016    GERD (gastroesophageal reflux disease)    H/O hiatal hernia    History of skin cancer    bilateral arms with removal   Hyperlipidemia    Hyperlipidemia LDL goal <70    Followed as Primary Care Patient/ [facility]/ [name]     - Target LDL < 70 as has IHD     Hypertension  Hypertensive heart disease    Followed as Primary Care Patient/ [facility]/ [name]      Ischemic heart disease    Left femoral hernia s/p laparoscopic repair 05/28/2017 05/28/2017   Lumbar disc disease    Myocardial infarction [facility]) 07-20-00   Right inguinal hernia s/p laparoscopic repair 05/28/2017 05/28/2017   Stage 3 chronic kidney disease 05/22/2015   trial off lasix/ppi/clinoril / [place] and renal eval 05/22/2015 >>>     T wave inversion in EKG 02/20/2014   Unstable angina ([place]) 11/23/2013     Family History  Problem Relation Age of Onset   Breast cancer Mother    Diabetes Mother    Hypertension Father    Dementia Father 64       early 39s   Diabetes Sister    COPD Brother        smoker   Colon cancer Neg Hx    Esophageal  cancer Neg Hx    Rectal cancer Neg Hx    Stomach cancer Neg Hx      Past Surgical History:  Procedure Laterality Date   COLONOSCOPY     CORONARY ARTERY BYPASS GRAFT  2000/07/20   LIMA-LAD, SVG-Diag, SVG-RCA   EYE SURGERY Bilateral 1995, 2000   ioc for cataracts   GROIN DISSECTION  01/23/2012   Procedure: Virl Son EXPLORATION;  Surgeon: Harl Bowie, MD;  Location: [facility];  Service: General;  Laterality: Left;  Left Inguinal Exploration, release of scar tissue, Placement of Mesh   INGUINAL HERNIA REPAIR Left 1983   INGUINAL HERNIA REPAIR Bilateral 05/28/2017   Procedure: LAPAROSCOPIC RIGHT AND LEFT INGUINAL HERNIA REPAIR WITH MESH;  Surgeon: Michael Boston, MD;  Location: [facility];  Service: General;  Laterality: Bilateral;   INSERTION OF MESH Bilateral 05/28/2017   Procedure: INSERTION OF MESH;  Surgeon: Michael Boston, MD;  Location: [facility];  Service: General;  Laterality: Bilateral;   LEFT HEART CATHETERIZATION WITH CORONARY ANGIOGRAM N/A 11/24/2013   Procedure: LEFT HEART CATHETERIZATION WITH CORONARY ANGIOGRAM;  Surgeon: Peter M Martinique, MD;  Location: [facility] CATH LAB;  Service: Cardiovascular;  Laterality: N/A;   NOSE SURGERY  1985    Social History   Socioeconomic History   Marital status: Married    Spouse name: [name]   Number of children: 0   Years of education: Not on file   Highest education level: 11th grade  Occupational History   Occupation: Retired    Fish farm manager: [employer]  Tobacco Use   Smoking status: Never Smoker   Smokeless tobacco: Former Systems developer    Types: Nurse, children's Use: Never used  Substance and Sexual Activity   Alcohol use: No    Alcohol/week: 0.0 standard drinks   Drug use: No   Sexual activity: Never  Other Topics Concern   Not on file  Social History Narrative   Remarried in 07-21-2003 after first wife died of emphysema in Jul 20, 2001. 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 He lives in a one story home with his wife. They raising 2 great grandchildren. One is 70 months old and the other is 79 years old. He has no biological children.    Social Determinants of Health   Financial Resource Strain:    Difficulty of Paying Living Expenses:   Food Insecurity:    Worried About Charity fundraiser in the Last Year:  Ran Out of Food in the Last Year:   Transportation Needs:    Film/video editor (Medical):    Lack of Transportation (Non-Medical):   Physical Activity:    Days of Exercise per Week:    Minutes of Exercise per Session:   Stress:    Feeling of Stress :   Social Connections:    Frequency of Communication with Friends and Family:    Frequency of Social Gatherings with Friends and Family:    Attends Religious Services:    Active Member of Clubs or Organizations:    Attends Archivist Meetings:    Marital Status:   Intimate Partner Violence:    Fear of Current or Ex-Partner:    Emotionally Abused:    Physically Abused:    Sexually Abused:      No Known Allergies   Immunization History  Administered Date(s) Administered   Fluad Quad(high Dose 65+) 03/14/2019   H1N1 05/02/2008   Influenza Split 02/10/2014   Influenza Whole 01/17/2008, 04/09/2009, 01/03/2011, 02/27/2012   Influenza, High Dose Seasonal PF 01/21/2017, 02/11/2018   Influenza,inj,Quad PF,6+ Mos 03/07/2013, 06/05/2015, 02/11/2016   Moderna SARS-COVID-2 Vaccination 08/23/2019, 09/20/2019   Pneumococcal Conjugate-13 03/28/2014   Pneumococcal Polysaccharide-23 01/02/2017   Tdap 06/30/2011, 05/30/2014    Outpatient Medications Prior to Visit  Medication Sig Dispense Refill   aspirin (ADULT ASPIRIN EC LOW STRENGTH) 81 MG EC tablet Take 81 mg by mouth every morning.      Cholecalciferol (VITAMIN D) 2000 UNITS tablet Take 2,000 Units by mouth daily.       docusate sodium (COLACE) 100 MG capsule Take 1 capsule by mouth every other day     doxazosin (CARDURA) 1 MG tablet Take 1 tablet (1 mg total) by mouth 2 (two) times daily. 180 tablet 3   fluticasone (FLONASE) 50 MCG/ACT nasal spray Place 2 sprays into both nostrils daily as needed for allergies or rhinitis.      furosemide (LASIX) 40 MG tablet Take 1 tablet (40 mg total) by mouth daily as needed for fluid or edema. 30 tablet 5   metoprolol succinate (TOPROL-XL) 50 MG 24 hr tablet TAKE 1 TABLET BY MOUTH IN THE MORNING AND 1/2 TABLET IN THE EVENING (Patient taking differently: Take 25-50 mg by mouth See admin instructions. TAKE 50mg  BY MOUTH IN THE MORNING AND 25mg   IN THE EVENING) 135 tablet 3   Multiple Vitamin (MULTIVITAMIN WITH MINERALS) TABS Take 1 tablet by mouth daily.      multivitamin-lutein (OCUVITE-LUTEIN) CAPS capsule Take 1 capsule by mouth daily.     nitroGLYCERIN (NITROSTAT) 0.4 MG SL tablet Place 1 tablet (0.4 mg total) under the tongue every 5 (five) minutes as needed for chest pain (MAX 3 TABLETS). 10 tablet 6   omeprazole (PRILOSEC) 20 MG capsule Take 1 capsule (20 mg total) by mouth 2 (two) times daily. 180 capsule 3   ondansetron (ZOFRAN ODT) 4 MG disintegrating tablet Take 1 tablet (4 mg total) by mouth every 8 (eight) hours as needed for nausea or vomiting. 20 tablet 0   oxymetazoline (AFRIN) 0.05 % nasal spray Place 2 sprays into the nose every 12 (twelve) hours as needed for congestion (x3 days). Dryness     potassium chloride SA (KLOR-CON M20) 20 MEQ tablet TAKE ONE TABLET BY MOUTH ONCE DAILY WHEN  YOU  TAKE LASIX (Patient taking differently: Take 20 mEq by mouth See admin instructions. Take with Lasix) 30 tablet 5   Simethicone (GAS-X EXTRA STRENGTH) 125 MG CAPS  Take by mouth as directed. Reported on 10/10/2015 one after each meal     simvastatin (ZOCOR) 20 MG tablet Take 1 tablet (20 mg total) by mouth at bedtime. 90 tablet 3   vitamin C (ASCORBIC ACID) 500  MG tablet Take 500 mg by mouth daily.     No facility-administered medications prior to visit.    Review of Systems  Constitutional: Positive for chills. Negative for fever and weight loss.       Stable appetite  HENT: Positive for hearing loss.   Cardiovascular: Positive for chest pain.  Gastrointestinal: Positive for heartburn and nausea. Negative for vomiting.  Musculoskeletal:       Left MTP knuckle pain  Neurological: Negative for focal weakness.  Endo/Heme/Allergies: Negative for environmental allergies.     Objective:   Vitals:   11/01/19 1531  BP: 140/70  Pulse: 65  Temp: 98.1 F (36.7 C)  SpO2: 98%  Weight: 169 lb (76.7 kg)  Height: 5' 10.5" (1.791 m)   98% on  RA BMI Readings from Last 3 Encounters:  11/01/19 23.91 kg/m  09/28/19 24.05 kg/m  09/27/19 24.16 kg/m   Wt Readings from Last 3 Encounters:  11/01/19 169 lb (76.7 kg)  09/28/19 170 lb (77.1 kg)  09/27/19 170 lb 12.8 oz (77.5 kg)    Physical Exam Vitals reviewed.  Constitutional:      General: He is not in acute distress.    Appearance: Normal appearance. He is not ill-appearing.  HENT:     Head: Normocephalic and atraumatic.     Ears:     Comments: Hard of hearing Eyes:     General: No scleral icterus. Cardiovascular:     Rate and Rhythm: Normal rate and regular rhythm.     Heart sounds: No murmur heard.   Pulmonary:     Comments: Breathing comfortably on room air, no conversational dyspnea.  No observed coughing.  Clear to auscultation bilaterally. Abdominal:     General: There is no distension.     Palpations: Abdomen is soft.  Musculoskeletal:        General: No swelling or deformity.     Cervical back: Neck supple.  Lymphadenopathy:     Cervical: No cervical adenopathy.  Skin:    General: Skin is warm and dry.     Findings: No rash.  Neurological:     General: No focal deficit present.     Mental Status: He is alert.     Coordination: Coordination normal.  Psychiatric:         Mood and Affect: Mood normal.        Behavior: Behavior normal.      CBC    Component Value Date/Time   WBC 7.4 09/27/2019 1048   WBC 7.1 05/20/2019 1304   RBC 4.01 (L) 09/27/2019 1048   RBC 4.48 05/20/2019 1304   HGB 12.7 (L) 09/27/2019 1048   HCT 36.6 (L) 09/27/2019 1048   PLT 162 09/27/2019 1048   MCV 91 09/27/2019 1048   MCH 31.7 09/27/2019 1048   MCH 31.3 05/20/2019 1304   MCHC 34.7 09/27/2019 1048   MCHC 33.2 05/20/2019 1304   RDW 13.1 09/27/2019 1048   LYMPHSABS 2.1 09/27/2019 1048   MONOABS 0.7 05/20/2019 1304   EOSABS 0.4 09/27/2019 1048   BASOSABS 0.1 09/27/2019 1048    CHEMISTRY No results for input(s): NA, K, CL, CO2, GLUCOSE, BUN, CREATININE, CALCIUM, MG, PHOS in the last 168 hours. CrCl cannot be calculated (Patient's most recent  lab result is older than the maximum 21 days allowed.).   Chest Imaging- films reviewed: CXR, 1 view 05/20/2019-peripheral rate lower lobe opacities, airway thickening.  Pulmonary Functions Testing Results: No flowsheet data [name].   Echocardiogram 09/08/2019: LVEF 55 to 41%, grade 1 diastolic dysfunction. LA moderately dilated, normal RV.  RA mildly dilated.  Mild MR, mild to moderate AR.  NM SPECT 09/28/2019:     Nuclear stress EF: 53%.  There was no ST segment deviation noted during stress.  Defect 1: There is a small defect of severe severity present in the basal inferoseptal and basal inferior location.  Findings consistent with prior myocardial infarction. There is no ischemia.  This is a low risk study.  The left ventricular ejection fraction is mildly decreased (45-54%).     Assessment & Plan:     ICD-10-CM   1. DOE (dyspnea on exertion)  R06.00 Pulmonary function test    CT Chest High Resolution  2. History of COVID-19  Z86.16 Pulmonary function test    CT Chest High Resolution    Chronic dyspnea on exertion and bandlike chest tightness-- concerning this is a manifestation of wheezing.  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  Deconditioning from prolonged infection and reduced mobility could be complicating his recovery. -PFTs -Trial of Advair 250-50 twice daily.  Instructed to rinse his mouth after every use. -Albuterol every 4 hours as needed -HRCT chest to evaluate for ILD.  CAD, stable HFpEF -Agree with cardiology that his testing does not fully explain the degree of his symptom change since having Covid.  RTC in 2 months after PFTs.  >50 minutes was spent on this encounter, including having to intermittently communicate through the patient's wife due to his hearing impairment.    Current Outpatient Medications:    aspirin (ADULT ASPIRIN EC LOW STRENGTH) 81 MG EC tablet, Take 81 mg by mouth every morning. , Disp: , Rfl:    Cholecalciferol (VITAMIN D) 2000 UNITS tablet, Take 2,000 Units by mouth daily. , Disp: , Rfl:    docusate sodium (COLACE) 100 MG capsule, Take 1 capsule by mouth every other day, Disp: , Rfl:    doxazosin (CARDURA) 1 MG tablet, Take 1 tablet (1 mg total) by mouth 2 (two) times daily., Disp: 180 tablet, Rfl: 3   fluticasone (FLONASE) 50 MCG/ACT nasal spray, Place 2 sprays into both nostrils daily as needed for allergies or rhinitis. , Disp: , Rfl:    furosemide (LASIX) 40 MG tablet, Take 1 tablet (40 mg total) by mouth daily as needed for fluid or edema., Disp: 30 tablet, Rfl: 5   metoprolol succinate (TOPROL-XL) 50 MG 24 hr tablet, TAKE 1 TABLET BY MOUTH IN THE MORNING AND 1/2 TABLET IN THE EVENING (Patient taking differently: Take 25-50 mg by mouth See admin instructions. TAKE 50mg  BY MOUTH IN THE MORNING AND 25mg   IN THE EVENING), Disp: 135 tablet, Rfl: 3   Multiple Vitamin (MULTIVITAMIN WITH MINERALS) TABS, Take 1 tablet by mouth daily. , Disp: , Rfl:    multivitamin-lutein (OCUVITE-LUTEIN) CAPS capsule, Take 1 capsule by mouth daily., Disp: , Rfl:    nitroGLYCERIN (NITROSTAT) 0.4 MG SL tablet, Place 1 tablet (0.4 mg  total) under the tongue every 5 (five) minutes as needed for chest pain (MAX 3 TABLETS)., Disp: 10 tablet, Rfl: 6   omeprazole (PRILOSEC) 20 MG capsule, Take 1 capsule (20 mg total) by mouth 2 (two) times daily., Disp: 180 capsule, Rfl: 3   ondansetron (  ZOFRAN ODT) 4 MG disintegrating tablet, Take 1 tablet (4 mg total) by mouth every 8 (eight) hours as needed for nausea or vomiting., Disp: 20 tablet, Rfl: 0   oxymetazoline (AFRIN) 0.05 % nasal spray, Place 2 sprays into the nose every 12 (twelve) hours as needed for congestion (x3 days). Dryness, Disp: , Rfl:    potassium chloride SA (KLOR-CON M20) 20 MEQ tablet, TAKE ONE TABLET BY MOUTH ONCE DAILY WHEN  YOU  TAKE LASIX (Patient taking differently: Take 20 mEq by mouth See admin instructions. Take with Lasix), Disp: 30 tablet, Rfl: 5   Simethicone (GAS-X EXTRA STRENGTH) 125 MG CAPS, Take by mouth as directed. Reported on 10/10/2015 one after each meal, Disp: , Rfl:    simvastatin (ZOCOR) 20 MG tablet, Take 1 tablet (20 mg total) by mouth at bedtime., Disp: 90 tablet, Rfl: 3   vitamin C (ASCORBIC ACID) 500 MG tablet, Take 500 mg by mouth daily., Disp: , Rfl:    albuterol (VENTOLIN HFA) 108 (90 Base) MCG/ACT inhaler, Inhale 2 puffs into the lungs every 6 (six) hours as needed for wheezing or shortness of breath., Disp: 18 g, Rfl: 11   Fluticasone-Salmeterol (ADVAIR DISKUS) 250-50 MCG/DOSE AEPB, Inhale 1 puff into the lungs 2 (two) times daily., Disp: 60 each, Rfl: [place] Emelia Sandoval, DO [facility] 11/01/2019 6:45 PM

## 2019-11-01 NOTE — Patient Instructions (Addendum)
Thank you for visiting Dr. Carlis Abbott at [facility]. We recommend the following: Orders Placed This Encounter  Procedures  . CT Chest High Resolution  . Pulmonary function test   Orders Placed This Encounter  Procedures  . CT Chest High Resolution    Standing Status:   Future    Standing Expiration Date:   10/31/2020    Order Specific Question:   ** REASON FOR EXAM (FREE TEXT)    Answer:   history of covid 04/2019 with persistent SOB    Order Specific Question:   Preferred imaging location?    Answer:   [facility]    Order Specific Question:   Radiology Contrast Protocol - do NOT remove file path    Answer:   [email or web address]  . Pulmonary function test    Standing Status:   Future    Standing Expiration Date:   10/31/2020    Order Specific Question:   Where should this test be performed?    Answer:    Pulmonary    Order Specific Question:   Full PFT: includes the following: basic spirometry, spirometry pre & post bronchodilator, diffusion capacity (DLCO), lung volumes    Answer:   Full PFT    Take Advair two times daily (about 12 hours apart). Rinse your mouth after every use.  Use albuterol as needed for worse shortness of breath, wheezing, or coughing.  Meds ordered this encounter  Medications  . Fluticasone-Salmeterol (ADVAIR DISKUS) 250-50 MCG/DOSE AEPB    Sig: Inhale 1 puff into the lungs 2 (two) times daily.    Dispense:  60 each    Refill:  11  . albuterol (VENTOLIN HFA) 108 (90 Base) MCG/ACT inhaler    Sig: Inhale 2 puffs into the lungs every 6 (six) hours as needed for wheezing or shortness of breath.    Dispense:  18 g    Refill:  11    Return in about 2 months (around 01/02/2020).  After PFTs.   Please do your part to reduce the spread of COVID-19.

## 2019-11-16 ENCOUNTER — Other Ambulatory Visit: Payer: Self-pay

## 2019-11-16 ENCOUNTER — Ambulatory Visit (HOSPITAL_COMMUNITY)
Admission: RE | Admit: 2019-11-16 | Discharge: 2019-11-16 | Disposition: A | Discharge status: Home / Self Care | Payer: Medicare PPO | Source: Ambulatory Visit | Attending: Critical Care Medicine | Admitting: Critical Care Medicine

## 2019-11-16 DIAGNOSIS — Z8616 Personal history of COVID-19: Secondary | ICD-10-CM | POA: Insufficient documentation

## 2019-11-16 DIAGNOSIS — R0600 Dyspnea, unspecified: Secondary | ICD-10-CM | POA: Insufficient documentation

## 2019-11-16 DIAGNOSIS — R0609 Other forms of dyspnea: Secondary | ICD-10-CM | POA: Diagnosis not present

## 2019-11-16 DIAGNOSIS — R918 Other nonspecific abnormal finding of lung field: Secondary | ICD-10-CM | POA: Diagnosis not present

## 2019-11-17 ENCOUNTER — Telehealth: Payer: Self-pay | Admitting: Critical Care Medicine

## 2019-11-17 DIAGNOSIS — R0600 Dyspnea, unspecified: Secondary | ICD-10-CM

## 2019-11-17 DIAGNOSIS — R0609 Other forms of dyspnea: Secondary | ICD-10-CM

## 2019-11-17 DIAGNOSIS — J849 Interstitial pulmonary disease, unspecified: Secondary | ICD-10-CM

## 2019-11-17 NOTE — Telephone Encounter (Signed)
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 ILD serologies ordered and he will need to fill out an ILD questionnaire before his follow up appointment. PFTs not yet completed.  Julian Hy, DO 11/17/19 8:26 PM [facility]

## 2019-11-21 ENCOUNTER — Telehealth: Payer: Self-pay | Admitting: Pulmonary Disease

## 2019-11-21 NOTE — Telephone Encounter (Signed)
      ATC Patient.  Left detailed message (DPR) for Patient to call and schedule OV with Dr Vaughan Browner per Dr Ainsley Spinner recommendation.  Dr Vaughan Browner has openings 11/25/19. Patient needs 51min OV for new Patient.    Marshell Garfinkel, MD  Elton Sin, LPN Can you schedule him to see me or MR in clinic soon. I may have some openings on 7/30. Had written to patrice to open clinic. Please check with her. 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 Looking back he had chronic honeycombing in 2018 on an abd CT scan. I am working on getting labs and the ILD packet for him, but I think his CT is classic enough to start treatment without Envisia biopsy. Are either of you interested in seeing him? If you're too busy he seems like he will probably be straight forward enough unless his serologies are positive.   Arby Barrette

## 2019-11-21 NOTE — Telephone Encounter (Signed)
ATC patient.  LMTCB. 

## 2019-11-22 NOTE — Telephone Encounter (Signed)
Patient called and scheduled OV with Dr Vaughan Browner, 11/25/19, at 0830.  Nothing further at this time.

## 2019-11-23 NOTE — Telephone Encounter (Signed)
ATC patient x2 no answer LMTCB  Patient has an appointment with Dr. Vaughan Browner on Friday 11/25/19 will forward to Dr. Vaughan Browner so he is aware and they can discuss results.

## 2019-11-25 ENCOUNTER — Telehealth: Payer: Self-pay | Admitting: Internal Medicine

## 2019-11-25 ENCOUNTER — Encounter: Payer: Self-pay | Admitting: Pulmonary Disease

## 2019-11-25 ENCOUNTER — Other Ambulatory Visit: Payer: Self-pay

## 2019-11-25 ENCOUNTER — Ambulatory Visit: Payer: Medicare PPO | Admitting: Pulmonary Disease

## 2019-11-25 VITALS — BP 146/72 | HR 70 | Temp 98.1°F | Ht 70.5 in | Wt 170.6 lb

## 2019-11-25 DIAGNOSIS — R0609 Other forms of dyspnea: Secondary | ICD-10-CM

## 2019-11-25 DIAGNOSIS — J849 Interstitial pulmonary disease, unspecified: Secondary | ICD-10-CM | POA: Diagnosis not present

## 2019-11-25 DIAGNOSIS — R0600 Dyspnea, unspecified: Secondary | ICD-10-CM | POA: Diagnosis not present

## 2019-11-25 LAB — SEDIMENTATION RATE: Sed Rate: 7 mm/hr (ref 0–20)

## 2019-11-25 LAB — C-REACTIVE PROTEIN: CRP: 1.2 mg/dL (ref 0.5–20.0)

## 2019-11-25 NOTE — Addendum Note (Signed)
Addended by: Elton Sin on: 11/25/2019 09:09 AM   Modules accepted: Orders

## 2019-11-25 NOTE — Progress Notes (Signed)
Isaac [name]    [id number]    06/08/1940  Primary Care Physician:Dettinger, Fransisca Kaufmann, MD  Referring Physician: Dettinger, Fransisca Kaufmann, MD [place],  [place] [id number]  Chief complaint: Consult for pulmonary fibrosis  HPI: 79 year old with history of coronary artery disease, CHF, CKD Diagnosed with COVID-19 infection in January 2020.  Did not require hospitalization and was and was treated with monoclonal antibody as an outpatient.  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  No ongoing exposures.  No mold, hot tub, Jacuzzi.  No feather pillows or comforters Smoking history: Never smoker Travel history: No significant travel history Relevant family history: No significant family history of lung disease  Outpatient Encounter Medications as of 11/25/2019  Medication Sig  . albuterol (VENTOLIN HFA) 108 (90 Base) MCG/ACT inhaler Inhale 2 puffs into the lungs every 6 (six) hours as needed for wheezing or shortness of breath.  [name] aspirin (ADULT ASPIRIN EC LOW STRENGTH) 81 MG EC tablet Take 81 mg by mouth every morning.   . Cholecalciferol (VITAMIN D) 2000 UNITS tablet Take 2,000 Units by mouth daily.   [name] docusate sodium (COLACE) 100 MG capsule Take 1 capsule by mouth every other day  . doxazosin (CARDURA) 1 MG tablet Take 1 tablet (1 mg total) by mouth 2 (two) times daily.  . fluticasone (FLONASE) 50 MCG/ACT nasal spray Place 2 sprays into both nostrils daily as needed for allergies or rhinitis.   . Fluticasone-Salmeterol (ADVAIR DISKUS) 250-50 MCG/DOSE AEPB Inhale 1 puff into the lungs 2 (two) times daily.  . furosemide (LASIX) 40 MG tablet Take 1 tablet (40 mg total)  by mouth daily as needed for fluid or edema.  . metoprolol succinate (TOPROL-XL) 50 MG 24 hr tablet TAKE 1 TABLET BY MOUTH IN THE MORNING AND 1/2 TABLET IN THE EVENING (Patient taking differently: Take 25-50 mg by mouth See admin instructions. TAKE 50mg  BY MOUTH IN THE MORNING AND 25mg   IN THE EVENING)  . Multiple Vitamin (MULTIVITAMIN WITH MINERALS) TABS Take 1 tablet by mouth daily.   . multivitamin-lutein (OCUVITE-LUTEIN) CAPS capsule Take 1 capsule by mouth daily.  . nitroGLYCERIN (NITROSTAT) 0.4 MG SL tablet Place 1 tablet (0.4 mg total) under the tongue every 5 (five) minutes as needed for chest pain (MAX 3 TABLETS).  [name] omeprazole (PRILOSEC) 20 MG capsule Take 1 capsule (20 mg total) by mouth 2 (two) times daily.  . ondansetron (ZOFRAN ODT) 4 MG disintegrating tablet Take 1 tablet (4 mg total) by mouth every 8 (eight) hours as needed for nausea or vomiting.  [name] oxymetazoline (AFRIN) 0.05 % nasal spray Place 2 sprays into the nose every 12 (twelve) hours as needed for congestion (x3 days). Dryness  . Simethicone (GAS-X EXTRA STRENGTH) 125 MG CAPS Take by mouth as directed. Reported on 10/10/2015 one after each meal  . simvastatin (ZOCOR) 20 MG tablet Take 1 tablet (20 mg total) by mouth at bedtime.  . vitamin C (ASCORBIC ACID) 500 MG tablet Take 500 mg by mouth daily.  . potassium chloride SA (KLOR-CON M20) 20 MEQ tablet TAKE ONE TABLET BY MOUTH ONCE DAILY WHEN  YOU  TAKE LASIX (Patient not  taking: Reported on 11/25/2019)   No facility-administered encounter medications on file as of 11/25/2019.    Allergies as of 11/25/2019  . (No Known Allergies)    Past Medical History:  Diagnosis Date  . Allergic rhinitis   . Allergy   . Arthritis 06/04/2014   Trial of sulindac 05/30/14 since cri risk with other nsaids   . Barrett's esophagus 10/27/2011  . BPH (benign prostatic hyperplasia) 02/20/2014  . CAD (coronary artery disease) of artery bypass graft, Occluded VG-non dominant LCX medical therapy  11/25/2013  . CAD (coronary artery disease), native coronary artery - LIMA-LAD, SVG-Diag, SVG-RCA 2002    Catheterization 2002 showing occluded LAD, 99% diagonal, occluded distal circumflex, 90% nondominant right coronary artery  CABG 08/06/00 by Dr. Nils Pyle with LIMA to LAD, SVG to diagonal, SVG to right coronary artery, circumflex was noted to be too small to graft    . CAP (community acquired pneumonia) 04/16/2016   See cxr 04/15/16 > treated with 7 days of Levaquin 500 mg daily with resolution radiographically and clinically.  . Cataract   . Cerumen impaction 06/05/2015  . CHF (congestive heart failure), NYHA class III ([place]) 02/20/2014  . Dyspnea    with exertion   . Dyspnea on exertion 11/08/2013   Followed as Primary Care Patient/ [facility]/ [name]  - new onset early June 2015  - 11/08/2013  Walked RA x 3 laps @ 185 ft each stopped due to  End of study, mild sob, no cp and no desat, EKG ok  - Cardiac w/u inconclusive but not clearly having ischemia  11/24/13   - 09/11/2016  Walked RA x 3 laps @ 185 ft each stopped due to  End of study, nl pace, no sob or desat   - Spirometry 09/11/2016    . Essential hypertension    Referred to [facility] med [facility] 12/12/2016   . GERD (gastroesophageal reflux disease)   . H/O hiatal hernia   . History of skin cancer    bilateral arms with removal  . Hyperlipidemia   . Hyperlipidemia LDL goal <70    Followed as Primary Care Patient/ [facility]/ [name]     - Target LDL < 70 as has IHD    . Hypertension   . Hypertensive heart disease    Followed as Primary Care Patient/ [facility]/ [name]     . Ischemic heart disease   . Left femoral hernia s/p laparoscopic repair 05/28/2017 05/28/2017  . Lumbar disc disease   . Myocardial infarction ([place]) 2002  . Right inguinal hernia s/p laparoscopic repair 05/28/2017 05/28/2017  . Stage 3 chronic kidney disease 05/22/2015   trial off lasix/ppi/clinoril / [place] and renal eval 05/22/2015 >>>    . T wave inversion in  EKG 02/20/2014  . Unstable angina ([place]) 11/23/2013    Past Surgical History:  Procedure Laterality Date  . COLONOSCOPY    . CORONARY ARTERY BYPASS GRAFT  2002   LIMA-LAD, SVG-Diag, SVG-RCA  . EYE SURGERY Bilateral 1995, 2000   ioc for cataracts  . GROIN DISSECTION  01/23/2012   Procedure: Virl Son EXPLORATION;  Surgeon: Harl Bowie, MD;  Location: [facility];  Service: General;  Laterality: Left;  Left Inguinal Exploration, release of scar tissue, Placement of Mesh  . INGUINAL HERNIA REPAIR Left 1983  . INGUINAL HERNIA REPAIR Bilateral 05/28/2017   Procedure: LAPAROSCOPIC RIGHT AND LEFT INGUINAL HERNIA REPAIR WITH MESH;  Surgeon: Michael Boston, MD;  Location: [facility];  Service: General;  Laterality: Bilateral;  . INSERTION OF MESH Bilateral 05/28/2017   Procedure: INSERTION OF MESH;  Surgeon: Michael Boston, MD;  Location: [place];  Service: General;  Laterality: Bilateral;  . LEFT HEART CATHETERIZATION WITH CORONARY ANGIOGRAM N/A 11/24/2013   Procedure: LEFT HEART CATHETERIZATION WITH CORONARY ANGIOGRAM;  Surgeon: Peter M Martinique, MD;  Location: [facility] CATH LAB;  Service: Cardiovascular;  Laterality: N/A;  . NOSE SURGERY  1985    Family History  Problem Relation Age of Onset  . Breast cancer Mother   . Diabetes Mother   . Hypertension Father   . Dementia Father 43       early 29s  . Diabetes Sister   . COPD Brother        smoker  . Colon cancer Neg Hx   . Esophageal cancer Neg Hx   . Rectal cancer Neg Hx   . Stomach cancer Neg Hx     Social History   Socioeconomic History  . Marital status: Married    Spouse name: [name]  . Number of children: 0  . Years of education: Not on file  . Highest education level: 11th grade  Occupational History  . Occupation: Retired    Fish farm manager: [employer]  Tobacco Use  . Smoking status: Never Smoker  . Smokeless tobacco: Former Systems developer    Types: Secondary school teacher  . Vaping Use: Never used    Substance and Sexual Activity  . Alcohol use: No    Alcohol/week: 0.0 standard drinks  . Drug use: No  . Sexual activity: Never  Other Topics Concern  . Not on file  Social History Narrative   Remarried in August 03, 2003 after first wife died of emphysema in 08/02/01. 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 He lives in a one story home with his wife. They raising 2 great grandchildren. One is 21 months old and the other is 79 years old. He has no biological children.    Social Determinants of Health   Financial Resource Strain:   . Difficulty of Paying Living Expenses:   Food Insecurity:   . Worried About Charity fundraiser in the Last Year:   . Arboriculturist in the Last Year:   Transportation Needs:   . Film/video editor (Medical):   [name] Lack of Transportation (Non-Medical):   Physical Activity:   . Days of Exercise per Week:   . Minutes of Exercise per Session:   Stress:   . Feeling of Stress :   Social Connections:   . Frequency of Communication with Friends and Family:   . Frequency of Social Gatherings with Friends and Family:   . Attends Religious Services:   . Active Member of Clubs or Organizations:   . Attends Archivist Meetings:   [name] Marital Status:   Intimate Partner Violence:   . Fear of Current or Ex-Partner:   . Emotionally Abused:   [name] Physically Abused:   . Sexually Abused:     Review of systems: Review of Systems  Constitutional: Negative for fever and chills.  HENT: Negative.   Eyes: Negative for blurred vision.  Respiratory: as per HPI  Cardiovascular: Negative for chest pain and palpitations.  Gastrointestinal: Negative for vomiting, diarrhea, blood per rectum. Genitourinary: Negative for dysuria, urgency, frequency and hematuria.  Musculoskeletal: Negative for myalgias, back pain and joint pain.  Skin: Negative for itching and  rash.  Neurological: Negative  for dizziness, tremors, focal weakness, seizures and loss of consciousness.  Endo/Heme/Allergies: Negative for environmental allergies.  Psychiatric/Behavioral: Negative for depression, suicidal ideas and hallucinations.  All other systems reviewed and are negative.  Physical Exam: Blood pressure (!) 146/72, pulse 70, temperature 98.1 F (36.7 C), temperature source Oral, height 5' 10.5" (1.791 m), weight 170 lb 9.6 oz (77.4 kg), SpO2 98 %. Gen:      No acute distress HEENT:  EOMI, sclera anicteric Neck:     No masses; no thyromegaly Lungs:   Basal crackles CV:         Regular rate and rhythm; no murmurs Abd:      + bowel sounds; soft, non-tender; no palpable masses, no distension Ext:    No edema; adequate peripheral perfusion Skin:      Warm and dry; no rash Neuro: alert and oriented x 3 Psych: normal mood and affect  Data Reviewed: Imaging: CT abdomen pelvis 10/16/2011-mild basal atelectasis/reticulation CT abdomen pelvis 04/26/2017-basal fibrosis with honeycombing High-res CT 11/16/2019-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  PFTs: Spirometry 09/11/2016 FVC 3.0 [70%], FEV1 2.3 [72%], F/F 75 Mild restriction  Labs:  Cardiac: Echocardiogram 09/08/2019 LVEF 11-55%, grade 1 diastolic dysfunction, normal PA systolic pressure.  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 His recent Covid infection may have change the trajectory of progression as he has increasing dyspnea now. He does have exposure to asbestos but that was remote and he does not have additional asbestos related finding such as pleural disease CTD serologies ordered which will be drawn today.  Give ILD questionnaire to check exposures   Discussed findings with patient and wife and we have decided to initiate treatment with Esbriet Recent LFTs are normal Recent echocardiogram without pulmonary  hypertension  Check BNP, pulmonary function test and 6-minute walk test for baseline assessment  Plan/Recommendations: CTD serologies, BNP PFTs, 6-minute walk Initiate paperwork for Esbriet  Marshell Garfinkel MD [facility] 11/25/2019, 8:37 AM  CC: Dettinger, Fransisca Kaufmann, MD

## 2019-11-25 NOTE — Addendum Note (Signed)
Addended by: Suzzanne Cloud E on: 11/25/2019 09:17 AM   Modules accepted: Orders

## 2019-11-25 NOTE — Patient Instructions (Signed)
I have reviewed your CT findings which show scarring in your lung.  A condition called pulmonary fibrosis This can get worse over time and we can try some treatments to slow down We'll start paperwork for medication called Esbriet  We'll check some labs and give you a questionnaire to see if there is any other reasons why you have the scarring in the lung Schedule pulmonary function test, 6-minute walk test  Follow-up in 2 months

## 2019-11-26 LAB — PRO B NATRIURETIC PEPTIDE: NT-Pro BNP: 1976 pg/mL — ABNORMAL HIGH (ref 0–486)

## 2019-11-28 ENCOUNTER — Telehealth: Payer: Self-pay | Admitting: Pharmacy Technician

## 2019-11-28 NOTE — Telephone Encounter (Signed)
Received New start paperwork for ESBRIET. Will update as we work through the benefits process.  

## 2019-11-28 NOTE — Telephone Encounter (Signed)
Submitted a Prior Authorization request to [facility] for [place] via Cover My Meds. Will update once we receive a response.   (Key: [id number]) - [id number]

## 2019-11-29 LAB — ANA,IFA RA DIAG PNL W/RFLX TIT/PATN
Anti Nuclear Antibody (ANA): POSITIVE — AB
Cyclic Citrullin Peptide Ab: <16 UNITS
Rheumatoid fact SerPl-aCnc: <14 IU/mL (ref <14)

## 2019-11-29 LAB — ANCA SCREEN W REFLEX TITER: ANCA Screen: NEGATIVE

## 2019-11-29 LAB — ANTI-NUCLEAR AB-TITER (ANA TITER): ANA Titer 1: 1:1280 {titer} — ABNORMAL HIGH

## 2019-11-29 LAB — ANTI-DNA ANTIBODY, DOUBLE-STRANDED: ds DNA Ab: 5 IU/mL — ABNORMAL HIGH

## 2019-11-29 LAB — ANTI-SCLERODERMA ANTIBODY: Scleroderma (Scl-70) (ENA) Antibody, IgG: <1 AI

## 2019-11-29 NOTE — Telephone Encounter (Signed)
Patient has been Approved for $9,000 PF grant through the [place] for copay assistance.  Pharmacy Card [id number] [phone] [id number]  Patient has no pharmacy restrictions and can fill through [facility].

## 2019-11-29 NOTE — Telephone Encounter (Signed)
Received notification from [facility] regarding a prior authorization for ESBRIET. Authorization has been APPROVED from 11/29/19 to 04/27/20.   Authorization # [id number]  Patient's copay for 1 month supply is $2,635.23. Will look into patient assistance options.

## 2019-11-30 LAB — HYPERSENSITIVITY PNEUMONITIS
A. Pullulans Abs: NEGATIVE
A.Fumigatus #1 Abs: NEGATIVE
Micropolyspora faeni, IgG: NEGATIVE
Pigeon Serum Abs: NEGATIVE
Thermoact. Saccharii: NEGATIVE
Thermoactinomyces vulgaris, IgG: NEGATIVE

## 2019-11-30 NOTE — Telephone Encounter (Signed)
Appt scheduled for 12/06/19

## 2019-12-05 NOTE — Progress Notes (Signed)
HPI  Patient presents today to [facility] for Initial appt with pharmacy team for Esbriet medication management. Pertinent past medical history includes IPF, CAD, CHF, GERD, Barrett's esophagus, and CKD stage 3. He is naive to anti-fibrotic therapy.  OBJECTIVE No Known Allergies  Outpatient Encounter Medications as of 12/06/2019  Medication Sig Note  . albuterol (VENTOLIN HFA) 108 (90 Base) MCG/ACT inhaler Inhale 2 puffs into the lungs every 6 (six) hours as needed for wheezing or shortness of breath.   [name] aspirin (ADULT ASPIRIN EC LOW STRENGTH) 81 MG EC tablet Take 81 mg by mouth every morning.    . Cholecalciferol (VITAMIN D) 2000 UNITS tablet Take 2,000 Units by mouth daily.    [name] docusate sodium (COLACE) 100 MG capsule Take 1 capsule by mouth every other day   . doxazosin (CARDURA) 1 MG tablet Take 1 tablet (1 mg total) by mouth 2 (two) times daily.   . fluticasone (FLONASE) 50 MCG/ACT nasal spray Place 2 sprays into both nostrils daily as needed for allergies or rhinitis.    . Fluticasone-Salmeterol (ADVAIR DISKUS) 250-50 MCG/DOSE AEPB Inhale 1 puff into the lungs 2 (two) times daily.   . furosemide (LASIX) 40 MG tablet Take 1 tablet (40 mg total) by mouth daily as needed for fluid or edema.   . metoprolol succinate (TOPROL-XL) 50 MG 24 hr tablet TAKE 1 TABLET BY MOUTH IN THE MORNING AND 1/2 TABLET IN THE EVENING (Patient taking differently: Take 25-50 mg by mouth See admin instructions. TAKE 38m BY MOUTH IN THE MORNING AND 215m IN THE EVENING)   . Multiple Vitamin (MULTIVITAMIN WITH MINERALS) TABS Take 1 tablet by mouth daily.    . multivitamin-lutein (OCUVITE-LUTEIN) CAPS capsule Take 1 capsule by mouth daily.   . nitroGLYCERIN (NITROSTAT) 0.4 MG SL tablet Place 1 tablet (0.4 mg total) under the tongue every 5 (five) minutes as needed for chest pain (MAX 3 TABLETS). 05/20/2019: Ran out  . omeprazole (PRILOSEC) 20 MG capsule Take 1 capsule (20 mg total) by mouth 2 (two) times  daily.   . ondansetron (ZOFRAN ODT) 4 MG disintegrating tablet Take 1 tablet (4 mg total) by mouth every 8 (eight) hours as needed for nausea or vomiting.   . Marland Kitchenxymetazoline (AFRIN) 0.05 % nasal spray Place 2 sprays into the nose every 12 (twelve) hours as needed for congestion (x3 days). Dryness   . potassium chloride SA (KLOR-CON M20) 20 MEQ tablet TAKE ONE TABLET BY MOUTH ONCE DAILY WHEN  YOU  TAKE LASIX (Patient not taking: Reported on 11/25/2019)   . Simethicone (GAS-X EXTRA STRENGTH) 125 MG CAPS Take by mouth as directed. Reported on 10/10/2015 one after each meal   . simvastatin (ZOCOR) 20 MG tablet Take 1 tablet (20 mg total) by mouth at bedtime.   . vitamin C (ASCORBIC ACID) 500 MG tablet Take 500 mg by mouth daily.    No facility-administered encounter medications on file as of 12/06/2019.     Immunization History  Administered Date(s) Administered  . Fluad Quad(high Dose 65+) 03/14/2019  . H1N1 05/02/2008  . Influenza Split 02/10/2014  . Influenza Whole 01/17/2008, 04/09/2009, 01/03/2011, 02/27/2012  . Influenza, High Dose Seasonal PF 01/21/2017, 02/11/2018  . Influenza,inj,Quad PF,6+ Mos 03/07/2013, 06/05/2015, 02/11/2016  . Moderna SARS-COVID-2 Vaccination 08/23/2019, 09/20/2019  . Pneumococcal Conjugate-13 03/28/2014  . Pneumococcal Polysaccharide-23 01/02/2017  . Tdap 06/30/2011, 05/30/2014     PFT's No results found for: FEV1, FVC, FEV1FVC, TLC, DLCO   CMP  Component Value Date/Time   NA 141 09/27/2019 1048   K 4.2 09/27/2019 1048   CL 103 09/27/2019 1048   CO2 24 09/27/2019 1048   GLUCOSE 97 09/27/2019 1048   GLUCOSE 129 (H) 05/20/2019 1304   BUN 19 09/27/2019 1048   CREATININE 1.55 (H) 09/27/2019 1048   CREATININE 1.33 10/13/2011 1653   CALCIUM 9.1 09/27/2019 1048   PROT 6.9 09/27/2019 1048   ALBUMIN 3.8 09/27/2019 1048   AST 16 09/27/2019 1048   ALT 9 09/27/2019 1048   ALKPHOS 69 09/27/2019 1048   BILITOT 0.3 09/27/2019 1048   GFRNONAA 42 (L)  09/27/2019 1048   GFRAA 49 (L) 09/27/2019 1048     CBC    Component Value Date/Time   WBC 7.4 09/27/2019 1048   WBC 7.1 05/20/2019 1304   RBC 4.01 (L) 09/27/2019 1048   RBC 4.48 05/20/2019 1304   HGB 12.7 (L) 09/27/2019 1048   HCT 36.6 (L) 09/27/2019 1048   PLT 162 09/27/2019 1048   MCV 91 09/27/2019 1048   MCH 31.7 09/27/2019 1048   MCH 31.3 05/20/2019 1304   MCHC 34.7 09/27/2019 1048   MCHC 33.2 05/20/2019 1304   RDW 13.1 09/27/2019 1048   LYMPHSABS 2.1 09/27/2019 1048   MONOABS 0.7 05/20/2019 1304   EOSABS 0.4 09/27/2019 1048   BASOSABS 0.1 09/27/2019 1048     LFT's Hepatic Function Latest Ref Rng & Units 09/27/2019 08/11/2019 03/14/2019  Total Protein 6.0 - 8.5 g/dL 6.9 7.4 7.2  Albumin 3.7 - 4.7 g/dL 3.8 4.5 4.1  AST 0 - 40 IU/L 16 19 16   ALT 0 - 44 IU/L 9 9 11   Alk Phosphatase 48 - 121 IU/L 69 81 81  Total Bilirubin 0.0 - 1.2 mg/dL 0.3 0.4 0.4  Bilirubin, Direct 0.0 - 0.3 mg/dL - - -     ASSESSMENT  1. Esbriet Medication Management  Patient counseled on purpose, proper use, and potential adverse effects including nausea, vomiting, abdominal pain, GERD, weight loss, arthralgia, dizziness, and suns sensitivity/rash.  Stressed the importance of routine lab monitoring. Will monitor LFT's every month for the first 6 months of treatment then every 3 months. Will monitor CBC every 3 months.  Starting dose will be Esbriet 267 mg 1 tablet three times daily for 7 days, then 2 tablets three times daily for 7 days, then 3 tablets three times daily.  Maintenance dose will be 801 mg 1 tablet three times daily if tolerated.  Stressed the importance of taking with meals to minimize stomach upset.    Esbriet was approved through insurance.  He was enrolled into [employer] as co-pay was unaffordable.  If funds are exhausted will assist with enrollment into another grant or patient assistance.  Prescription sent to [facility] for starter dose and maintenance dose along with grant  information.   First shipment scheduled to ship Thursday to arrive Friday.  2. Medication Reconciliation  A drug regimen assessment was performed, including review of allergies, interactions, disease-state management, dosing and immunization history. Medications were reviewed with the patient, including name, instructions, indication, goals of therapy, potential side effects, importance of adherence, and safe use.  Drug interaction(s):  No major interactions identified  3. Immunizations  Patient is up to date with annual influenza, pneumonia, and Covid-19 vaccines.  Recommend Shingrix vaccine.  PLAN  Start Esbriet  Hepatic panel monthly for 6 months.  Standing orders placed.  CBC every 3 months.  Standing orders placed.  All questions encouraged and answered.  Instructed patient to call with any further questions or concerns.  Thank you for allowing pharmacy to participate in this patient's care.  This appointment required  55 minutes of patient care (this includes precharting, chart review, review of results, face-to-face care, etc.).   Mariella Saa, PharmD, [place], CPP Clinical Specialty Pharmacist (Rheumatology and Pulmonology)  12/06/2019 4:07 PM

## 2019-12-06 ENCOUNTER — Other Ambulatory Visit: Payer: Self-pay

## 2019-12-06 ENCOUNTER — Ambulatory Visit: Payer: Medicare Other | Admitting: Pharmacist

## 2019-12-06 DIAGNOSIS — J849 Interstitial pulmonary disease, unspecified: Secondary | ICD-10-CM

## 2019-12-06 DIAGNOSIS — Z5181 Encounter for therapeutic drug level monitoring: Secondary | ICD-10-CM

## 2019-12-06 MED ORDER — ESBRIET 267 MG PO TABS: ORAL_TABLET | ORAL | 0 refills | Status: AC

## 2019-12-06 MED ORDER — ESBRIET 267 MG PO TABS: 3.0000 | ORAL_TABLET | Freq: Three times a day (TID) | ORAL | 1 refills | Status: DC

## 2019-12-06 NOTE — Patient Instructions (Addendum)
Thank you for meeting with the pharmacy team today!  Below find a summary of what we discussed at your visit: . START Esbriet (delivery set for Friday 13th) . Labs every month for the first 6 months o At our office 8:30-1pm and 2-5pm If you experience nausea you can try flat ginger ale, ginger tea, ginger chews Please call if nausea is unbearable and we can discuss anti-nausea prescriptions or adjusting the dose. If you experience diarrhea you can take Imodium over the counter Take 2 capsules at first loose stool Then take 1 capsule after each subsequent loose stool Do not take more than 8 capsules in 1 day  Call [phone] with any questions or concerns.  Mariella Saa, PharmD, [place], CPP Clinical Specialty Pharmacist (Rheumatology and Pulmonology)  12/06/2019 3:07 PM

## 2019-12-08 MED FILL — ESBRIET 267 MG TABS: 267 | 28 days supply | Qty: 207 | Fill #0

## 2019-12-09 LAB — MYOMARKER 3 PLUS PROFILE (RDL)
Anti-EJ Ab (RDL): NEGATIVE
Anti-Jo-1 Ab (RDL): <20 Units (ref <20)
Anti-Ku Ab (RDL): NEGATIVE
Anti-MDA-5 Ab (CADM-140)(RDL): <20 Units (ref <20)
Anti-Mi-2 Ab (RDL): NEGATIVE
Anti-NXP-2 (P140) Ab (RDL): <20 Units (ref <20)
Anti-OJ Ab (RDL): NEGATIVE
Anti-PL-12 Ab (RDL: NEGATIVE
Anti-PL-7 Ab (RDL): NEGATIVE
Anti-PM/Scl-100 Ab (RDL): <20 Units (ref <20)
Anti-SAE1 Ab, IgG (RDL): <20 Units (ref <20)
Anti-SRP Ab (RDL): NEGATIVE
Anti-SS-A 52kD Ab, IgG (RDL): 179 Units — ABNORMAL HIGH (ref <20)
Anti-TIF-1gamma Ab (RDL): <20 Units (ref <20)
Anti-U1 RNP Ab (RDL): <20 Units (ref <20)
Anti-U2 RNP Ab (RDL): NEGATIVE
Anti-U3 RNP (Fibrillarin)(RDL): NEGATIVE

## 2019-12-14 DIAGNOSIS — H6123 Impacted cerumen, bilateral: Secondary | ICD-10-CM | POA: Diagnosis not present

## 2019-12-21 ENCOUNTER — Other Ambulatory Visit: Payer: Self-pay | Admitting: Family Medicine

## 2019-12-22 NOTE — Telephone Encounter (Signed)
Last OV 08/11/19. Last RF 08/11/19. Next OV 02/10/20

## 2020-01-03 ENCOUNTER — Other Ambulatory Visit: Payer: Self-pay

## 2020-01-03 ENCOUNTER — Ambulatory Visit (INDEPENDENT_AMBULATORY_CARE_PROVIDER_SITE_OTHER): Payer: BC Managed Care – PPO | Admitting: Pulmonary Disease

## 2020-01-03 DIAGNOSIS — J849 Interstitial pulmonary disease, unspecified: Secondary | ICD-10-CM

## 2020-01-03 DIAGNOSIS — R0600 Dyspnea, unspecified: Secondary | ICD-10-CM

## 2020-01-03 DIAGNOSIS — R0609 Other forms of dyspnea: Secondary | ICD-10-CM

## 2020-01-03 LAB — PULMONARY FUNCTION TEST
DL/VA % pred: 60 %
DL/VA: 2.37 ml/min/mmHg/L
DLCO cor % pred: 45 %
DLCO cor: 11.23 ml/min/mmHg
DLCO unc % pred: 45 %
DLCO unc: 11.23 ml/min/mmHg
FEF 25-75 Post: 2.16 L/sec
FEF 25-75 Pre: 1.79 L/sec
FEF2575-%Change-Post: 20 %
FEF2575-%Pred-Post: 106 %
FEF2575-%Pred-Pre: 88 %
FEV1-%Change-Post: 6 %
FEV1-%Pred-Post: 77 %
FEV1-%Pred-Pre: 73 %
FEV1-Post: 2.26 L
FEV1-Pre: 2.13 L
FEV1FVC-%Change-Post: 3 %
FEV1FVC-%Pred-Pre: 105 %
FEV6-%Change-Post: 3 %
FEV6-%Pred-Post: 75 %
FEV6-%Pred-Pre: 73 %
FEV6-Post: 2.88 L
FEV6-Pre: 2.79 L
FEV6FVC-%Change-Post: 0 %
FEV6FVC-%Pred-Post: 107 %
FEV6FVC-%Pred-Pre: 106 %
FVC-%Change-Post: 2 %
FVC-%Pred-Post: 71 %
FVC-%Pred-Pre: 69 %
FVC-Post: 2.89 L
FVC-Pre: 2.81 L
Post FEV1/FVC ratio: 78 %
Post FEV6/FVC ratio: 100 %
Pre FEV1/FVC ratio: 76 %
Pre FEV6/FVC Ratio: 100 %
RV % pred: 97 %
RV: 2.56 L
TLC % pred: 76 %
TLC: 5.38 L

## 2020-01-03 NOTE — [facility] Notes (Signed)
Full PFT completed today ? ?

## 2020-01-16 ENCOUNTER — Other Ambulatory Visit: Payer: Self-pay

## 2020-01-16 ENCOUNTER — Ambulatory Visit (INDEPENDENT_AMBULATORY_CARE_PROVIDER_SITE_OTHER): Payer: Medicare Other | Admitting: Internal Medicine

## 2020-01-16 ENCOUNTER — Encounter: Payer: Self-pay | Admitting: Internal Medicine

## 2020-01-16 VITALS — BP 140/68 | HR 65 | Ht 70.5 in | Wt 170.0 lb

## 2020-01-16 DIAGNOSIS — R0609 Other forms of dyspnea: Secondary | ICD-10-CM

## 2020-01-16 DIAGNOSIS — R0600 Dyspnea, unspecified: Secondary | ICD-10-CM | POA: Diagnosis not present

## 2020-01-16 DIAGNOSIS — I10 Essential (primary) hypertension: Secondary | ICD-10-CM

## 2020-01-16 DIAGNOSIS — I2510 Atherosclerotic heart disease of native coronary artery without angina pectoris: Secondary | ICD-10-CM | POA: Diagnosis not present

## 2020-01-16 NOTE — Patient Instructions (Signed)

## 2020-01-16 NOTE — Progress Notes (Signed)
HPI Isaac [name] returns today for followup. He has a h/o dyspnea and CAD, s/p CABG over 15 years ago with preserved LV function. He was found to have pulmonary fibrosis. He has been on medical therapy. He dyspnea remains class 2. He is not using oxygen today.  No Known Allergies   Current Outpatient Medications  Medication Sig Dispense Refill  . albuterol (VENTOLIN HFA) 108 (90 Base) MCG/ACT inhaler Inhale 2 puffs into the lungs every 6 (six) hours as needed for wheezing or shortness of breath. 18 g 11  . aspirin (ADULT ASPIRIN EC LOW STRENGTH) 81 MG EC tablet Take 81 mg by mouth every morning.     . Cholecalciferol (VITAMIN D) 2000 UNITS tablet Take 2,000 Units by mouth daily.     [name] docusate sodium (COLACE) 100 MG capsule Take 1 capsule by mouth every other day    . doxazosin (CARDURA) 1 MG tablet Take 1 tablet (1 mg total) by mouth 2 (two) times daily. 180 tablet 3  . fluticasone (FLONASE) 50 MCG/ACT nasal spray Place 2 sprays into both nostrils daily as needed for allergies or rhinitis.     . Fluticasone-Salmeterol (ADVAIR DISKUS) 250-50 MCG/DOSE AEPB Inhale 1 puff into the lungs 2 (two) times daily. 60 each 11  . furosemide (LASIX) 40 MG tablet Take 1 tablet (40 mg total) by mouth daily as needed for fluid or edema. 30 tablet 5  . metoprolol succinate (TOPROL-XL) 50 MG 24 hr tablet TAKE 1 TABLET BY MOUTH IN THE MORNING AND 1/2 TABLET IN THE EVENING (Patient taking differently: Take 25-50 mg by mouth See admin instructions. TAKE 50mg  BY MOUTH IN THE MORNING AND 25mg   IN THE EVENING) 135 tablet 3  . Multiple Vitamin (MULTIVITAMIN WITH MINERALS) TABS Take 1 tablet by mouth daily.     . multivitamin-lutein (OCUVITE-LUTEIN) CAPS capsule Take 1 capsule by mouth daily.    . nitroGLYCERIN (NITROSTAT) 0.4 MG SL tablet Place 1 tablet (0.4 mg total) under the tongue every 5 (five) minutes as needed for chest pain (MAX 3 TABLETS). 10 tablet 6  . omeprazole (PRILOSEC) 20 MG capsule Take 1 capsule  (20 mg total) by mouth 2 (two) times daily. 180 capsule 3  . ondansetron (ZOFRAN-ODT) 4 MG disintegrating tablet DISSOLVE 1 TABLET IN MOUTH EVERY 8 HOURS AS NEEDED FOR NAUSEA OR VOMITING 20 tablet 0  . Pirfenidone (ESBRIET) 267 MG TABS Take 3 tablets (801 mg total) by mouth with breakfast, with lunch, and with evening meal. 270 tablet 1  . potassium chloride SA (KLOR-CON M20) 20 MEQ tablet TAKE ONE TABLET BY MOUTH ONCE DAILY WHEN  YOU  TAKE LASIX 30 tablet 5  . Simethicone (GAS-X EXTRA STRENGTH) 125 MG CAPS Take by mouth as directed. Reported on 10/10/2015 one after each meal    . simvastatin (ZOCOR) 20 MG tablet Take 1 tablet (20 mg total) by mouth at bedtime. 90 tablet 3  . vitamin C (ASCORBIC ACID) 500 MG tablet Take 500 mg by mouth daily.     No current facility-administered medications for this visit.     Past Medical History:  Diagnosis Date  . Allergic rhinitis   . Allergy   . Arthritis 06/04/2014   Trial of sulindac 05/30/14 since cri risk with other nsaids   . Barrett's esophagus 10/27/2011  . BPH (benign prostatic hyperplasia) 02/20/2014  . CAD (coronary artery disease) of artery bypass graft, Occluded VG-non dominant LCX medical therapy 11/25/2013  . CAD (coronary artery disease),  native coronary artery - LIMA-LAD, SVG-Diag, SVG-RCA 2002    Catheterization 2002 showing occluded LAD, 99% diagonal, occluded distal circumflex, 90% nondominant right coronary artery  CABG 08/06/00 by Dr. Nils Pyle with LIMA to LAD, SVG to diagonal, SVG to right coronary artery, circumflex was noted to be too small to graft    . CAP (community acquired pneumonia) 04/16/2016   See cxr 04/15/16 > treated with 7 days of Levaquin 500 mg daily with resolution radiographically and clinically.  . Cataract   . Cerumen impaction 06/05/2015  . CHF (congestive heart failure), NYHA class III ([place]) 02/20/2014  . Dyspnea    with exertion   . Dyspnea on exertion 11/08/2013   Followed as Primary Care Patient/ [facility]/ [name]  - new onset early June 2015  - 11/08/2013  Walked RA x 3 laps @ 185 ft each stopped due to  End of study, mild sob, no cp and no desat, EKG ok  - Cardiac w/u inconclusive but not clearly having ischemia  11/24/13   - 09/11/2016  Walked RA x 3 laps @ 185 ft each stopped due to  End of study, nl pace, no sob or desat   - Spirometry 09/11/2016    . Essential hypertension    Referred to [facility] 12/12/2016   . GERD (gastroesophageal reflux disease)   . H/O hiatal hernia   . History of skin cancer    bilateral arms with removal  . Hyperlipidemia   . Hyperlipidemia LDL goal <70    Followed as Primary Care Patient/ [facility]/ [name]     - Target LDL < 70 as has IHD    . Hypertension   . Hypertensive heart disease    Followed as Primary Care Patient/ [facility]/ [name]     . Ischemic heart disease   . Left femoral hernia s/p laparoscopic repair 05/28/2017 05/28/2017  . Lumbar disc disease   . Myocardial infarction ([place]) 2002  . Right inguinal hernia s/p laparoscopic repair 05/28/2017 05/28/2017  . Stage 3 chronic kidney disease 05/22/2015   trial off lasix/ppi/clinoril / [place] and renal eval 05/22/2015 >>>    . T wave inversion in EKG 02/20/2014  . Unstable angina ([place]) 11/23/2013    ROS:   All systems reviewed and negative except as noted in the HPI.   Past Surgical History:  Procedure Laterality Date  . COLONOSCOPY    . CORONARY ARTERY BYPASS GRAFT  2002   LIMA-LAD, SVG-Diag, SVG-RCA  . EYE SURGERY Bilateral 1995, 2000   ioc for cataracts  . GROIN DISSECTION  01/23/2012   Procedure: Virl Son EXPLORATION;  Surgeon: Harl Bowie, MD;  Location: [facility];  Service: General;  Laterality: Left;  Left Inguinal Exploration, release of scar tissue, Placement of Mesh  . INGUINAL HERNIA REPAIR Left 1983  . INGUINAL HERNIA REPAIR Bilateral 05/28/2017   Procedure: LAPAROSCOPIC RIGHT AND LEFT INGUINAL HERNIA REPAIR WITH MESH;  Surgeon: Michael Boston, MD;  Location: [place] [facility];  Service: General;  Laterality: Bilateral;  . INSERTION OF MESH Bilateral 05/28/2017   Procedure: INSERTION OF MESH;  Surgeon: Michael Boston, MD;  Location: [place];  Service: General;  Laterality: Bilateral;  . LEFT HEART CATHETERIZATION WITH CORONARY ANGIOGRAM N/A 11/24/2013   Procedure: LEFT HEART CATHETERIZATION WITH CORONARY ANGIOGRAM;  Surgeon: Peter M Martinique, MD;  Location: [facility] CATH LAB;  Service: Cardiovascular;  Laterality: N/A;  . NOSE SURGERY  1985     Family History  Problem Relation Age of Onset  . Breast cancer Mother   . Diabetes Mother   . Hypertension Father   . Dementia Father 63       early 27s  . Diabetes Sister   . COPD Brother        smoker  . Colon cancer Neg Hx   . Esophageal cancer Neg Hx   . Rectal cancer Neg Hx   . Stomach cancer Neg Hx      Social History   Socioeconomic History  . Marital status: Married    Spouse name: [name]  . Number of children: 0  . Years of education: Not on file  . Highest education level: 11th grade  Occupational History  . Occupation: Retired    Fish farm manager: [employer]  Tobacco Use  . Smoking status: Never Smoker  . Smokeless tobacco: Former Systems developer    Types: Secondary school teacher  . Vaping Use: Never used  Substance and Sexual Activity  . Alcohol use: No    Alcohol/week: 0.0 standard drinks  . Drug use: No  . Sexual activity: Never  Other Topics Concern  . Not on file  Social History Narrative   Remarried in 04-Aug-2003 after first wife died of emphysema in 08-03-2001. 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 He lives in a one story home with his wife. They raising 2 great grandchildren. One is 11 months old and the other is 79 years old. He has no biological children.    Social Determinants of Health   Financial Resource Strain:   . Difficulty of Paying Living Expenses: Not on file  Food  Insecurity:   . Worried About Charity fundraiser in the Last Year: Not on file  . Ran Out of Food in the Last Year: Not on file  Transportation Needs:   . Lack of Transportation (Medical): Not on file  . Lack of Transportation (Non-Medical): Not on file  Physical Activity:   . Days of Exercise per Week: Not on file  . Minutes of Exercise per Session: Not on file  Stress:   . Feeling of Stress : Not on file  Social Connections:   . Frequency of Communication with Friends and Family: Not on file  . Frequency of Social Gatherings with Friends and Family: Not on file  . Attends Religious Services: Not on file  . Active Member of Clubs or Organizations: Not on file  . Attends Archivist Meetings: Not on file  . Marital Status: Not on file  Intimate Partner Violence:   . Fear of Current or Ex-Partner: Not on file  . Emotionally Abused: Not on file  . Physically Abused: Not on file  . Sexually Abused: Not on file     BP 140/68   Pulse 65   Ht 5' 10.5" (1.791 m)   Wt 170 lb (77.1 kg)   SpO2 98%   BMI 24.05 kg/m   Physical Exam:  stable appearing 79 yo man, NAD HEENT: Unremarkable Neck:  No JVD, no thyromegally Lymphatics:  No adenopathy Back:  No CVA tenderness Lungs:  Clear with no wheezes. Scattered rales are present HEART:  Regular rate rhythm, no murmurs, no rubs, no clicks Abd:  soft, positive bowel sounds, no organomegally, no rebound, no guarding Ext:  2 plus pulses, no edema, no cyanosis, no clubbing Skin:  No rashes no nodules Neuro:  CN II through  XII intact, motor grossly intact  Assess/Plan: 1. Dyspnea - his symptoms are stable. He will continue lasix. I asked him to reduce his salt intake. 2. Pulmonary fibrosis - he has had some nausea and wonders if it is due to the Perfinidione.  3. CAD - he is s/p CABG. He denies anginal symptoms.   Carleene Overlie Lilyahna Sirmon,MD

## 2020-01-19 ENCOUNTER — Other Ambulatory Visit: Payer: Self-pay

## 2020-01-19 ENCOUNTER — Encounter: Payer: Self-pay | Admitting: Pulmonary Disease

## 2020-01-19 ENCOUNTER — Ambulatory Visit (INDEPENDENT_AMBULATORY_CARE_PROVIDER_SITE_OTHER): Payer: Medicare Other

## 2020-01-19 ENCOUNTER — Ambulatory Visit (INDEPENDENT_AMBULATORY_CARE_PROVIDER_SITE_OTHER): Payer: Medicare Other | Admitting: Pulmonary Disease

## 2020-01-19 VITALS — BP 140/68 | HR 48 | Temp 97.3°F | Ht 70.0 in | Wt 168.6 lb

## 2020-01-19 DIAGNOSIS — J84112 Idiopathic pulmonary fibrosis: Secondary | ICD-10-CM

## 2020-01-19 DIAGNOSIS — R0600 Dyspnea, unspecified: Secondary | ICD-10-CM

## 2020-01-19 DIAGNOSIS — J849 Interstitial pulmonary disease, unspecified: Secondary | ICD-10-CM | POA: Diagnosis not present

## 2020-01-19 DIAGNOSIS — Z23 Encounter for immunization: Secondary | ICD-10-CM | POA: Diagnosis not present

## 2020-01-19 DIAGNOSIS — R0609 Other forms of dyspnea: Secondary | ICD-10-CM

## 2020-01-19 DIAGNOSIS — Z8616 Personal history of COVID-19: Secondary | ICD-10-CM

## 2020-01-19 DIAGNOSIS — Z5181 Encounter for therapeutic drug level monitoring: Secondary | ICD-10-CM

## 2020-01-19 LAB — COMPREHENSIVE METABOLIC PANEL
ALT: 8 U/L (ref 0–53)
AST: 14 U/L (ref 0–37)
Albumin: 3.9 g/dL (ref 3.5–5.2)
Alkaline Phosphatase: 70 U/L (ref 39–117)
BUN: 20 mg/dL (ref 6–23)
CO2: 28 mEq/L (ref 19–32)
Calcium: 9 mg/dL (ref 8.4–10.5)
Chloride: 103 mEq/L (ref 96–112)
Creatinine, Ser: 1.46 mg/dL (ref 0.40–1.50)
GFR: 46.56 mL/min — ABNORMAL LOW (ref >60.00)
Glucose, Bld: 105 mg/dL — ABNORMAL HIGH (ref 70–99)
Potassium: 4.1 mEq/L (ref 3.5–5.1)
Sodium: 137 mEq/L (ref 135–145)
Total Bilirubin: 0.5 mg/dL (ref 0.2–1.2)
Total Protein: 7.6 g/dL (ref 6.0–8.3)

## 2020-01-19 MED ORDER — ESBRIET 801 MG PO TABS: 1.0000 | ORAL_TABLET | Freq: Three times a day (TID) | ORAL | 2 refills | Status: DC

## 2020-01-19 NOTE — Progress Notes (Signed)
Six Minute Walk - 01/19/20 1008      Six Minute Walk   Medications taken before test (dose and time) None    Supplemental oxygen during test? No    Baseline BP (sitting) 140/66    Baseline Heartrate 69    Baseline Dyspnea (Borg Scale) 0    Baseline Fatigue (Borg Scale) 0    Baseline SPO2 94 %      End of Test Values    BP (sitting) 145/78    Heartrate 66    Dyspnea (Borg Scale) 0.5    Fatigue (Borg Scale) 3    SPO2 98 %      2 Minutes Post Walk Values   BP (sitting) 132/80    Heartrate 65    SPO2 98 %    Stopped or paused before six minutes? Yes    Reason: Patient had chest tightness.      Interpretation   Tech Comments: Patient needed to stop and rest had some chest tightness. Patient walked normal pace.

## 2020-01-19 NOTE — Progress Notes (Signed)
Isaac [name]    [id number]    01/22/1941  Primary Care Physician:Dettinger, Fransisca Kaufmann, MD  Referring Physician: Dettinger, Fransisca Kaufmann, MD [place],  [place] [address]  Chief complaint:  Follow-up for IPF Started Esbriet September 2930  HPI: 79 year old with history of coronary artery disease, CHF, CKD Diagnosed with COVID-19 infection in January 2020.  Did not require hospitalization and was and was treated with monoclonal antibody as an outpatient.  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  No ongoing exposures.  No mold, hot tub, Jacuzzi.  No feather pillows or comforters Smoking history: Never smoker Travel history: No significant travel history Relevant family history: No significant family history of lung disease  Interim history: Started Esbriet on December 28, 2019 He is tolerating well except for mild nausea.  Had mild chest tightness on 6-minute walk today which resolved spontaneously.  This has apparently a longstanding issue.  He follows with Dr. Modesta Messing for coronary artery disease with recent visit  Outpatient Encounter Medications as of 01/19/2020  Medication Sig  . albuterol (VENTOLIN HFA) 108 (90 Base) MCG/ACT inhaler Inhale 2 puffs into the lungs every 6 (six) hours as needed for wheezing or shortness of breath.  [name] aspirin (ADULT ASPIRIN EC LOW STRENGTH) 81 MG EC tablet Take 81 mg by mouth every morning.   . Cholecalciferol (VITAMIN D) 2000 UNITS tablet Take 2,000 Units by mouth daily.   [name] docusate sodium (COLACE) 100 MG capsule Take 1 capsule by mouth every other day  . doxazosin (CARDURA) 1 MG tablet Take 1 tablet (1 mg total) by mouth  2 (two) times daily.  . fluticasone (FLONASE) 50 MCG/ACT nasal spray Place 2 sprays into both nostrils daily as needed for allergies or rhinitis.   . Fluticasone-Salmeterol (ADVAIR DISKUS) 250-50 MCG/DOSE AEPB Inhale 1 puff into the lungs 2 (two) times daily.  . furosemide (LASIX) 40 MG tablet Take 1 tablet (40 mg total) by mouth daily as needed for fluid or edema.  . metoprolol succinate (TOPROL-XL) 50 MG 24 hr tablet TAKE 1 TABLET BY MOUTH IN THE MORNING AND 1/2 TABLET IN THE EVENING (Patient taking differently: Take 25-50 mg by mouth See admin instructions. TAKE 50mg  BY MOUTH IN THE MORNING AND 25mg   IN THE EVENING)  . Multiple Vitamin (MULTIVITAMIN WITH MINERALS) TABS Take 1 tablet by mouth daily.   . multivitamin-lutein (OCUVITE-LUTEIN) CAPS capsule Take 1 capsule by mouth daily.  . nitroGLYCERIN (NITROSTAT) 0.4 MG SL tablet Place 1 tablet (0.4 mg total) under the tongue every 5 (five) minutes as needed for chest pain (MAX 3 TABLETS).  [name] omeprazole (PRILOSEC) 20 MG capsule Take 1 capsule (20 mg total) by mouth 2 (two) times daily.  . ondansetron (ZOFRAN-ODT) 4 MG disintegrating tablet DISSOLVE 1 TABLET IN MOUTH EVERY 8 HOURS AS NEEDED FOR NAUSEA OR VOMITING  . Pirfenidone (ESBRIET) 267 MG TABS Take 3 tablets (801 mg total) by mouth with breakfast, with lunch, and with evening meal.  . potassium chloride SA (KLOR-CON M20) 20 MEQ tablet TAKE ONE TABLET BY MOUTH ONCE DAILY WHEN  YOU  TAKE LASIX  . Simethicone (GAS-X EXTRA STRENGTH) 125 MG  CAPS Take by mouth as directed. Reported on 10/10/2015 one after each meal  . simvastatin (ZOCOR) 20 MG tablet Take 1 tablet (20 mg total) by mouth at bedtime.  . vitamin C (ASCORBIC ACID) 500 MG tablet Take 500 mg by mouth daily.   No facility-administered encounter medications on file as of 01/19/2020.    Allergies as of 01/19/2020  . (No Known Allergies)    Physical Exam: Blood pressure 140/68, pulse (!) 48, temperature (!) 97.3 F (36.3 C), temperature  source Oral, height 5\' 10"  (1.778 m), weight 168 lb 9.6 oz (76.5 kg), SpO2 99 %. Gen:      No acute distress HEENT:  EOMI, sclera anicteric Neck:     No masses; no thyromegaly Lungs:    Clear to auscultation bilaterally; normal respiratory effort CV:         Regular rate and rhythm; no murmurs Abd:      + bowel sounds; soft, non-tender; no palpable masses, no distension Ext:    No edema; adequate peripheral perfusion Skin:      Warm and dry; no rash Neuro: alert and oriented x 3 Psych: normal mood and affect  Data Reviewed: Imaging: CT abdomen pelvis 10/16/2011-mild basal atelectasis/reticulation CT abdomen pelvis 04/26/2017-basal fibrosis with honeycombing High-res CT 11/16/2019-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  PFTs: Spirometry 09/11/2016 FVC 3.0 [70%], FEV1 2.3 [72%], F/F 75 Mild restriction  01/03/2020 FVC 2.81 [69%], FEV1 2.13 [73%], F/F 76, TLC 4.38 [76%], DLCO 9.23 [45%] Mild restriction, severe diffusion defect  6-minute walk test  01/19/2020 340 m, nadir O2 sat of 94%  Labs: Hepatic panel 09/27/2019-within normal limits proBNP 11/25/2019-1976   Cardiac: Echocardiogram 09/08/2019 LVEF 67-34%, grade 1 diastolic dysfunction, normal PA systolic pressure.   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 His recent Covid infection may have changed the trajectory of progression as he has increasing dyspnea now. He does have exposure to asbestos but that was remote and he does not have additional asbestos related finding such as pleural disease CTD serologies ordered which will be drawn today.  Give ILD questionnaire to check exposures   Started Esbriet and is tolerating it well.  Check LFTs Recent echocardiogram without pulmonary hypertension though BNP is elevated Continue monitoring  Plan/Recommendations: Continue Esbriet Check hepatic panel  Marshell Garfinkel  MD [facility] 01/19/2020, 9:46 AM  CC: Dettinger, Fransisca Kaufmann, MD

## 2020-01-19 NOTE — Patient Instructions (Signed)
I am glad you are tolerating the Esbriet I will send a message to the pharmacy that you need a renewal of the medication We will check comprehensive metabolic panel to evaluate liver test today  Follow-up in 1 month.

## 2020-01-23 ENCOUNTER — Telehealth: Payer: Self-pay | Admitting: Pulmonary Disease

## 2020-01-23 NOTE — Telephone Encounter (Signed)
Pt's wife calling back. She stated that the medication was Esbriet and I informed her that it was called in to [facility] and provided her with their number. Nothing further needed.

## 2020-01-23 NOTE — Telephone Encounter (Signed)
We need to know which medication pt's wife is asking if it was sent to the pharmacy so that way we can check on this for her and pt.  Attempted to call pt's wife but unable to reach. Left message for her to return call.

## 2020-01-24 MED FILL — ESBRIET 801 MG TABS: 801 | 30 days supply | Qty: 90 | Fill #0

## 2020-01-30 ENCOUNTER — Telehealth: Payer: Self-pay | Admitting: Critical Care Medicine

## 2020-01-30 NOTE — Telephone Encounter (Signed)
Tried calling Isaac [name] and there was no answer- LMTCB

## 2020-02-02 NOTE — Telephone Encounter (Signed)
LMTCB x1 for pt's wife.

## 2020-02-02 NOTE — Telephone Encounter (Signed)
Pt's wife called back regarding Advair.  Pt also prescribed Esbriet by Dr. Vaughan Browner.  States Esbriet causes nausea.  Please advise.  [phone] or [phone] (cell)

## 2020-02-02 NOTE — Telephone Encounter (Addendum)
Called and spoke with [name] who states that patient has only been able to tolerate a half tablet of the Esbriet and that it is not making him throw up, fever last night, not much of an appetite. States that when he takes whole tablet it makes him sleep all day and throw up.  Dr. Vaughan Browner please advise  Called pharmacy and asked to refill patient's Advair due to him not being able to find his. Wife states he has dementia and they are not sure where he put it. Spoke with Gabriel Cirri who was going to call his insurance and try to get an override and will call me back.  Gabriel Cirri called back and stated that insurance approved for patient to ger override. They are getting medication ready for patient. Called and spoke with [name] to make her aware. Nothing further needed at this time.

## 2020-02-03 MED ORDER — ONDANSETRON HCL 4 MG PO TABS: 4.0000 mg | ORAL_TABLET | Freq: Three times a day (TID) | ORAL | 1 refills | Status: DC | PRN

## 2020-02-03 NOTE — Addendum Note (Signed)
Addended by: Lorretta Harp on: 02/03/2020 09:13 AM   Modules accepted: Orders

## 2020-02-03 NOTE — Telephone Encounter (Signed)
Called and spoke with [name] letting her know the info stated by Dr. Vaughan Browner that pt could take a break from the Esbriet x1 week and then restart the med to see if things get better. Also stated to her that we were sending Rx for Zofran to pharmacy for pt which he could take tid prn nausea. [name] verbalized understanding. Verified preferred pharmacy and sent Rx in for pt. Nothing further needed.

## 2020-02-03 NOTE — Telephone Encounter (Signed)
This interesting because clinic visit a few weeks ago that indicated that he was tolerating Esbriet well. He can give it a break for a week and then retry Call in Zofran 4 mg p.o. 3 times a day as needed for nausea

## 2020-02-09 ENCOUNTER — Other Ambulatory Visit: Payer: Self-pay

## 2020-02-09 MED ORDER — ALBUTEROL SULFATE HFA 108 (90 BASE) MCG/ACT IN AERS: 2.0000 | INHALATION_SPRAY | Freq: Four times a day (QID) | RESPIRATORY_TRACT | 3 refills | Status: AC | PRN

## 2020-02-10 ENCOUNTER — Ambulatory Visit (INDEPENDENT_AMBULATORY_CARE_PROVIDER_SITE_OTHER): Payer: Medicare Other | Admitting: Family Medicine

## 2020-02-10 ENCOUNTER — Encounter: Payer: Self-pay | Admitting: Family Medicine

## 2020-02-10 ENCOUNTER — Other Ambulatory Visit: Payer: Self-pay

## 2020-02-10 VITALS — BP 145/69 | HR 65 | Temp 97.0°F | Ht 70.0 in | Wt 163.0 lb

## 2020-02-10 DIAGNOSIS — I10 Essential (primary) hypertension: Secondary | ICD-10-CM | POA: Diagnosis not present

## 2020-02-10 DIAGNOSIS — E785 Hyperlipidemia, unspecified: Secondary | ICD-10-CM

## 2020-02-10 DIAGNOSIS — N1832 Chronic kidney disease, stage 3b: Secondary | ICD-10-CM | POA: Diagnosis not present

## 2020-02-10 DIAGNOSIS — I110 Hypertensive heart disease with heart failure: Secondary | ICD-10-CM

## 2020-02-10 DIAGNOSIS — I5043 Acute on chronic combined systolic (congestive) and diastolic (congestive) heart failure: Secondary | ICD-10-CM

## 2020-02-10 DIAGNOSIS — I5042 Chronic combined systolic (congestive) and diastolic (congestive) heart failure: Secondary | ICD-10-CM

## 2020-02-10 MED ORDER — METOPROLOL SUCCINATE ER 50 MG PO TB24: ORAL_TABLET | ORAL | 3 refills | Status: DC

## 2020-02-10 MED ORDER — SIMVASTATIN 20 MG PO TABS: 20.0000 mg | ORAL_TABLET | Freq: Every day | ORAL | 3 refills | Status: DC

## 2020-02-10 MED ORDER — DOXAZOSIN MESYLATE 1 MG PO TABS
1.0000 mg | ORAL_TABLET | Freq: Two times a day (BID) | ORAL | 3 refills | Status: DC
Start: 2020-02-10 — End: 2020-02-13

## 2020-02-10 MED ORDER — OMEPRAZOLE 20 MG PO CPDR
20.0000 mg | DELAYED_RELEASE_CAPSULE | Freq: Two times a day (BID) | ORAL | 3 refills | Status: DC
Start: 2020-02-10 — End: 2020-02-13

## 2020-02-10 MED ORDER — FUROSEMIDE 40 MG PO TABS: 40.0000 mg | ORAL_TABLET | Freq: Every day | ORAL | 5 refills | Status: DC | PRN

## 2020-02-10 NOTE — Progress Notes (Signed)
BP (!) 145/69   Pulse 65   Temp (!) 97 F (36.1 C)   Ht _0  (1.778 m)   Wt 163 lb (73.9 kg)   SpO2 98%   BMI 23.39 kg/m    Subjective:   Patient ID: Isaac [name], male    DOB: 1940-11-26, 79 y.o.   MRN: [id number]  HPI: Isaac [name] is a 79 y.o. male presenting on 02/10/2020 for Medical Management of Chronic Issues, Hypertension, and Chronic Kidney Disease   HPI Hypertension Patient is currently on doxazosin and furosemide and metoprolol, and their blood pressure today is 145/69. Patient denies any lightheadedness or dizziness. Patient denies headaches, blurred vision, chest pains, shortness of breath, or weakness. Denies any side effects from medication and is content with current medication.   Hyperlipidemia Patient is coming in for recheck of his hyperlipidemia. The patient is currently taking simvastatin. They deny any issues with myalgias or history of liver damage from it. They deny any focal numbness or weakness or chest pain.   Patient has CHF and CKD that her balance and neuro monitoring, he continues to take the diuretic and he says his breathing is doing good day by day.  He takes it 1 day at a time.  Relevant past medical, surgical, family and social history reviewed and updated as indicated. Interim medical history since our last visit reviewed. Allergies and medications reviewed and updated.  Review of Systems  Constitutional: Negative for chills and fever.  Eyes: Negative for visual disturbance.  Respiratory: Negative for shortness of breath and wheezing.   Cardiovascular: Negative for chest pain and leg swelling.  Gastrointestinal: Positive for nausea (Patient gets nausea with the new medication from GI, he has been discussing it with them.).  Musculoskeletal: Negative for back pain and gait problem.  Skin: Negative for rash.  All other systems reviewed and are negative.   Per HPI unless specifically indicated above   Allergies as of 02/10/2020   No  Known Allergies     Medication List       Accurate as of February 10, 2020 10:04 AM. If you have any questions, ask your nurse or doctor.        Adult Aspirin EC Low Strength 81 MG EC tablet Generic drug: aspirin Take 81 mg by mouth every morning.   albuterol 108 (90 Base) MCG/ACT inhaler Commonly known as: VENTOLIN HFA Inhale 2 puffs into the lungs every 6 (six) hours as needed for wheezing or shortness of breath.   docusate sodium 100 MG capsule Commonly known as: COLACE Take 1 capsule by mouth every other day   doxazosin 1 MG tablet Commonly known as: Cardura Take 1 tablet (1 mg total) by mouth 2 (two) times daily.   Esbriet 801 MG Tabs Generic drug: Pirfenidone Take 1 tablet by mouth with breakfast, with lunch, and with evening meal.   fluticasone 50 MCG/ACT nasal spray Commonly known as: FLONASE Place 2 sprays into both nostrils daily as needed for allergies or rhinitis.   Fluticasone-Salmeterol 250-50 MCG/DOSE Aepb Commonly known as: Advair Diskus Inhale 1 puff into the lungs 2 (two) times daily.   furosemide 40 MG tablet Commonly known as: LASIX Take 1 tablet (40 mg total) by mouth daily as needed for fluid or edema.   Gas-X Extra Strength 125 MG Caps Generic drug: Simethicone Take by mouth as directed. Reported on 10/10/2015 one after each meal   metoprolol succinate 50 MG 24 hr tablet Commonly known as: TOPROL-XL TAKE  1 TABLET BY MOUTH IN THE MORNING AND 1/2 TABLET IN THE EVENING What changed:   how much to take  how to take this  when to take this  additional instructions   multivitamin with minerals Tabs tablet Take 1 tablet by mouth daily.   multivitamin-lutein Caps capsule Take 1 capsule by mouth daily.   nitroGLYCERIN 0.4 MG SL tablet Commonly known as: NITROSTAT Place 1 tablet (0.4 mg total) under the tongue every 5 (five) minutes as needed for chest pain (MAX 3 TABLETS).   omeprazole 20 MG capsule Commonly known as: PRILOSEC Take 1  capsule (20 mg total) by mouth 2 (two) times daily.   ondansetron 4 MG disintegrating tablet Commonly known as: ZOFRAN-ODT DISSOLVE 1 TABLET IN MOUTH EVERY 8 HOURS AS NEEDED FOR NAUSEA OR VOMITING   ondansetron 4 MG tablet Commonly known as: Zofran Take 1 tablet (4 mg total) by mouth 3 (three) times daily as needed for nausea or vomiting.   potassium chloride SA 20 MEQ tablet Commonly known as: Klor-Con M20 TAKE ONE TABLET BY MOUTH ONCE DAILY WHEN  YOU  TAKE LASIX   simvastatin 20 MG tablet Commonly known as: ZOCOR Take 1 tablet (20 mg total) by mouth at bedtime.   vitamin C 500 MG tablet Commonly known as: ASCORBIC ACID Take 500 mg by mouth daily.   Vitamin D 50 MCG (2000 UT) tablet Take 2,000 Units by mouth daily.        Objective:   BP (!) 145/69   Pulse 65   Temp (!) 97 F (36.1 C)   Ht _0  (1.778 m)   Wt 163 lb (73.9 kg)   SpO2 98%   BMI 23.39 kg/m   Wt Readings from Last 3 Encounters:  02/10/20 163 lb (73.9 kg)  01/19/20 168 lb 9.6 oz (76.5 kg)  01/16/20 170 lb (77.1 kg)    Physical Exam Vitals and nursing note reviewed.  Constitutional:      General: He is not in acute distress.    Appearance: He is well-developed. He is not diaphoretic.  Eyes:     General: No scleral icterus.    Conjunctiva/sclera: Conjunctivae normal.  Neck:     Thyroid: No thyromegaly.  Cardiovascular:     Rate and Rhythm: Normal rate and regular rhythm.     Heart sounds: Normal heart sounds. No murmur heard.   Pulmonary:     Effort: Pulmonary effort is normal. No respiratory distress.     Breath sounds: Normal breath sounds. No wheezing.  Musculoskeletal:        General: Normal range of motion.     Cervical back: Neck supple.  Lymphadenopathy:     Cervical: No cervical adenopathy.  Skin:    General: Skin is warm and dry.     Findings: No rash.  Neurological:     Mental Status: He is alert and oriented to person, place, and time.     Coordination: Coordination  normal.  Psychiatric:        Behavior: Behavior normal.       Assessment & Plan:   Problem List Items Addressed This Visit      Cardiovascular and Mediastinum   Hypertensive heart disease (Chronic)   Relevant Medications   doxazosin (CARDURA) 1 MG tablet   furosemide (LASIX) 40 MG tablet   metoprolol succinate (TOPROL-XL) 50 MG 24 hr tablet   simvastatin (ZOCOR) 20 MG tablet   Essential hypertension   Relevant Medications   doxazosin (CARDURA) 1  MG tablet   furosemide (LASIX) 40 MG tablet   metoprolol succinate (TOPROL-XL) 50 MG 24 hr tablet   simvastatin (ZOCOR) 20 MG tablet   Other Relevant Orders   CBC with Differential/Platelet   CHF (congestive heart failure), NYHA class III (HCC)   Relevant Medications   doxazosin (CARDURA) 1 MG tablet   furosemide (LASIX) 40 MG tablet   metoprolol succinate (TOPROL-XL) 50 MG 24 hr tablet   simvastatin (ZOCOR) 20 MG tablet   Other Relevant Orders   CBC with Differential/Platelet     Genitourinary   Stage 3 chronic kidney disease (HCC)   Relevant Orders   CMP14+EGFR     Other   Hyperlipidemia LDL goal <70 - Primary (Chronic)   Relevant Medications   doxazosin (CARDURA) 1 MG tablet   furosemide (LASIX) 40 MG tablet   metoprolol succinate (TOPROL-XL) 50 MG 24 hr tablet   simvastatin (ZOCOR) 20 MG tablet   Other Relevant Orders   Lipid panel      Continue current medication, no changes continue to follow-up with a specialist. Follow up plan: Return in about 6 months (around 08/10/2020), or if symptoms worsen or fail to improve, for Hypertension and CHF and CKD.  Counseling provided for all of the vaccine components Orders Placed This Encounter  Procedures  . CBC with Differential/Platelet  . CMP14+EGFR  . Lipid panel    Caryl Pina, MD [facility] 02/10/2020, 10:04 AM

## 2020-02-11 LAB — CMP14+EGFR
ALT: 10 IU/L (ref 0–44)
AST: 11 IU/L (ref 0–40)
Albumin/Globulin Ratio: 1.3 (ref 1.2–2.2)
Albumin: 3.8 g/dL (ref 3.7–4.7)
Alkaline Phosphatase: 88 IU/L (ref 44–121)
BUN/Creatinine Ratio: 14 (ref 10–24)
BUN: 21 mg/dL (ref 8–27)
Bilirubin Total: 0.5 mg/dL (ref 0.0–1.2)
CO2: 22 mmol/L (ref 20–29)
Calcium: 9 mg/dL (ref 8.6–10.2)
Chloride: 104 mmol/L (ref 96–106)
Creatinine, Ser: 1.45 mg/dL — ABNORMAL HIGH (ref 0.76–1.27)
GFR calc Af Amer: 53 mL/min/{1.73_m2} — ABNORMAL LOW (ref >59)
GFR calc non Af Amer: 45 mL/min/{1.73_m2} — ABNORMAL LOW (ref >59)
Globulin, Total: 3 g/dL (ref 1.5–4.5)
Glucose: 103 mg/dL — ABNORMAL HIGH (ref 65–99)
Potassium: 4.4 mmol/L (ref 3.5–5.2)
Sodium: 140 mmol/L (ref 134–144)
Total Protein: 6.8 g/dL (ref 6.0–8.5)

## 2020-02-11 LAB — CBC WITH DIFFERENTIAL/PLATELET
Basophils Absolute: 0.1 10*3/uL (ref 0.0–0.2)
Basos: 1 %
EOS (ABSOLUTE): 0.7 10*3/uL — ABNORMAL HIGH (ref 0.0–0.4)
Eos: 8 %
Hematocrit: 37.1 % — ABNORMAL LOW (ref 37.5–51.0)
Hemoglobin: 12.2 g/dL — ABNORMAL LOW (ref 13.0–17.7)
Immature Grans (Abs): 0 10*3/uL (ref 0.0–0.1)
Immature Granulocytes: 0 %
Lymphocytes Absolute: 1.7 10*3/uL (ref 0.7–3.1)
Lymphs: 21 %
MCH: 30.1 pg (ref 26.6–33.0)
MCHC: 32.9 g/dL (ref 31.5–35.7)
MCV: 92 fL (ref 79–97)
Monocytes Absolute: 0.8 10*3/uL (ref 0.1–0.9)
Monocytes: 10 %
Neutrophils Absolute: 4.9 10*3/uL (ref 1.4–7.0)
Neutrophils: 60 %
Platelets: 190 10*3/uL (ref 150–450)
RBC: 4.05 x10E6/uL — ABNORMAL LOW (ref 4.14–5.80)
RDW: 13 % (ref 11.6–15.4)
WBC: 8.2 10*3/uL (ref 3.4–10.8)

## 2020-02-11 LAB — LIPID PANEL
Chol/HDL Ratio: 3.4 ratio (ref 0.0–5.0)
Cholesterol, Total: 139 mg/dL (ref 100–199)
HDL: 41 mg/dL (ref >39)
LDL Chol Calc (NIH): 72 mg/dL (ref 0–99)
Triglycerides: 152 mg/dL — ABNORMAL HIGH (ref 0–149)
VLDL Cholesterol Cal: 26 mg/dL (ref 5–40)

## 2020-02-13 MED ORDER — DOXAZOSIN MESYLATE 1 MG PO TABS
1.0000 mg | ORAL_TABLET | Freq: Two times a day (BID) | ORAL | 3 refills | Status: DC
Start: 2020-02-13 — End: 2020-09-13

## 2020-02-13 MED ORDER — OMEPRAZOLE 20 MG PO CPDR
20.0000 mg | DELAYED_RELEASE_CAPSULE | Freq: Two times a day (BID) | ORAL | 3 refills | Status: DC
Start: 2020-02-13 — End: 2021-02-27

## 2020-02-13 MED ORDER — METOPROLOL SUCCINATE ER 50 MG PO TB24: ORAL_TABLET | ORAL | 3 refills | Status: DC

## 2020-02-13 MED ORDER — SIMVASTATIN 20 MG PO TABS: 20.0000 mg | ORAL_TABLET | Freq: Every day | ORAL | 3 refills | Status: DC

## 2020-02-13 MED ORDER — FUROSEMIDE 40 MG PO TABS: 40.0000 mg | ORAL_TABLET | Freq: Every day | ORAL | 5 refills | Status: DC | PRN

## 2020-02-13 NOTE — Progress Notes (Signed)
E-prescribe down. resent 

## 2020-02-13 NOTE — Addendum Note (Signed)
Addended by: Antonietta Barcelona D on: 02/13/2020 09:12 AM   Modules accepted: Orders

## 2020-02-15 ENCOUNTER — Telehealth: Payer: Self-pay

## 2020-02-15 ENCOUNTER — Other Ambulatory Visit: Payer: Self-pay | Admitting: Family Medicine

## 2020-02-15 NOTE — Telephone Encounter (Signed)
Pt returned missed call for lab results. Reviewed results with pt per Dr Neldon Mc notes. Pt voiced understanding.

## 2020-02-18 ENCOUNTER — Emergency Department (HOSPITAL_COMMUNITY): Payer: Medicare Other

## 2020-02-18 ENCOUNTER — Emergency Department (HOSPITAL_COMMUNITY)
Admission: EM | Admit: 2020-02-18 | Discharge: 2020-02-18 | Disposition: A | Discharge status: Home / Self Care | Payer: Medicare Other | Attending: Emergency Medicine | Admitting: Emergency Medicine

## 2020-02-18 ENCOUNTER — Encounter (HOSPITAL_COMMUNITY): Payer: Self-pay | Admitting: Emergency Medicine

## 2020-02-18 DIAGNOSIS — I130 Hypertensive heart and chronic kidney disease with heart failure and stage 1 through stage 4 chronic kidney disease, or unspecified chronic kidney disease: Secondary | ICD-10-CM | POA: Diagnosis not present

## 2020-02-18 DIAGNOSIS — W1781XA Fall down embankment (hill), initial encounter: Secondary | ICD-10-CM | POA: Diagnosis not present

## 2020-02-18 DIAGNOSIS — Z23 Encounter for immunization: Secondary | ICD-10-CM | POA: Diagnosis not present

## 2020-02-18 DIAGNOSIS — Z87891 Personal history of nicotine dependence: Secondary | ICD-10-CM | POA: Diagnosis not present

## 2020-02-18 DIAGNOSIS — I2510 Atherosclerotic heart disease of native coronary artery without angina pectoris: Secondary | ICD-10-CM | POA: Insufficient documentation

## 2020-02-18 DIAGNOSIS — N183 Chronic kidney disease, stage 3 unspecified: Secondary | ICD-10-CM | POA: Diagnosis not present

## 2020-02-18 DIAGNOSIS — S80211A Abrasion, right knee, initial encounter: Secondary | ICD-10-CM

## 2020-02-18 DIAGNOSIS — I509 Heart failure, unspecified: Secondary | ICD-10-CM | POA: Diagnosis not present

## 2020-02-18 DIAGNOSIS — S0003XA Contusion of scalp, initial encounter: Secondary | ICD-10-CM | POA: Diagnosis not present

## 2020-02-18 DIAGNOSIS — Z79899 Other long term (current) drug therapy: Secondary | ICD-10-CM | POA: Insufficient documentation

## 2020-02-18 DIAGNOSIS — Z7982 Long term (current) use of aspirin: Secondary | ICD-10-CM | POA: Insufficient documentation

## 2020-02-18 DIAGNOSIS — S0101XA Laceration without foreign body of scalp, initial encounter: Secondary | ICD-10-CM | POA: Diagnosis not present

## 2020-02-18 DIAGNOSIS — Z951 Presence of aortocoronary bypass graft: Secondary | ICD-10-CM | POA: Insufficient documentation

## 2020-02-18 DIAGNOSIS — M25561 Pain in right knee: Secondary | ICD-10-CM | POA: Diagnosis not present

## 2020-02-18 DIAGNOSIS — G319 Degenerative disease of nervous system, unspecified: Secondary | ICD-10-CM | POA: Diagnosis not present

## 2020-02-18 DIAGNOSIS — Z85828 Personal history of other malignant neoplasm of skin: Secondary | ICD-10-CM | POA: Insufficient documentation

## 2020-02-18 DIAGNOSIS — I639 Cerebral infarction, unspecified: Secondary | ICD-10-CM | POA: Diagnosis not present

## 2020-02-18 DIAGNOSIS — W19XXXA Unspecified fall, initial encounter: Secondary | ICD-10-CM

## 2020-02-18 DIAGNOSIS — G9389 Other specified disorders of brain: Secondary | ICD-10-CM | POA: Diagnosis not present

## 2020-02-18 MED ORDER — TETANUS-DIPHTH-ACELL PERTUSSIS 5-2.5-18.5 LF-MCG/0.5 IM SUSP
0.5000 mL | Freq: Once | INTRAMUSCULAR | Status: AC
  Administered 2020-02-18: 0.5 mL via INTRAMUSCULAR
  Filled 2020-02-18: qty 0.5

## 2020-02-18 MED ORDER — ACETAMINOPHEN 325 MG PO TABS
650.0000 mg | ORAL_TABLET | Freq: Once | ORAL | Status: AC
  Administered 2020-02-18: 650 mg via ORAL
  Filled 2020-02-18: qty 2

## 2020-02-18 NOTE — Discharge Instructions (Addendum)
It was our pleasure to provide your ER care today - we hope that you feel better.  Keep wounds/abrasions very clean. Follow up with your doctor or urgent care in 7-10 days time for removal of sutures.   Your blood pressure is high tonight - follow up with your doctor.   Take acetaminophen as need.   Return to ER if worse, new or severe pain, infection of wounds, severe headache, or other concern.

## 2020-02-18 NOTE — ED Provider Notes (Signed)
[facility] EMERGENCY DEPARTMENT Provider Note   CSN: [id number] Arrival date & time: 02/18/20  1926     History Chief Complaint  Patient presents with  . Leg Injury    R  . Head Injury    Isaac [name] is a 79 y.o. male.  Patient presents s/p fall. States his mower has no brakes, and he was holding onto back of it with rope, as it moved down hill towards spouse. States the mower/rope pulled him forwards over top of mower, where he had contusion/abrasion to right knee, and contusion/laceration to back of scalp. Denies loc, but was dazed/stunned. No headache. No neck or back pain. No radicular pain. Denies nausea or vomiting. No change in speech or vision. No numbness/weakness. No chest pain or sob. No abd pain or nv. Tetanus unknown. Denies any other pain or injury. No anticoagulant use.   The history is provided by the patient.  Head Injury Associated symptoms: no headaches, no nausea, no neck pain, no numbness and no vomiting        Past Medical History:  Diagnosis Date  . Allergic rhinitis   . Allergy   . Arthritis 06/04/2014   Trial of sulindac 05/30/14 since cri risk with other nsaids   . Barrett's esophagus 10/27/2011  . BPH (benign prostatic hyperplasia) 02/20/2014  . CAD (coronary artery disease) of artery bypass graft, Occluded VG-non dominant LCX medical therapy 11/25/2013  . CAD (coronary artery disease), native coronary artery - LIMA-LAD, SVG-Diag, SVG-RCA 2002    Catheterization 2002 showing occluded LAD, 99% diagonal, occluded distal circumflex, 90% nondominant right coronary artery  CABG 08/06/00 by Dr. Nils Pyle with LIMA to LAD, SVG to diagonal, SVG to right coronary artery, circumflex was noted to be too small to graft    . CAP (community acquired pneumonia) 04/16/2016   See cxr 04/15/16 > treated with 7 days of Levaquin 500 mg daily with resolution radiographically and clinically.  . Cataract   . Cerumen impaction 06/05/2015  . CHF (congestive heart failure), NYHA  class III ([place]) 02/20/2014  . Dyspnea    with exertion   . Dyspnea on exertion 11/08/2013   Followed as Primary Care Patient/ [facility]/ [name]  - new onset early June 2015  - 11/08/2013  Walked RA x 3 laps @ 185 ft each stopped due to  End of study, mild sob, no cp and no desat, EKG ok  - Cardiac w/u inconclusive but not clearly having ischemia  11/24/13   - 09/11/2016  Walked RA x 3 laps @ 185 ft each stopped due to  End of study, nl pace, no sob or desat   - Spirometry 09/11/2016    . Essential hypertension    Referred to Int med [facility] 12/12/2016   . GERD (gastroesophageal reflux disease)   . H/O hiatal hernia   . History of skin cancer    bilateral arms with removal  . Hyperlipidemia   . Hyperlipidemia LDL goal <70    Followed as Primary Care Patient/ [facility]/ [name]     - Target LDL < 70 as has IHD    . Hypertension   . Hypertensive heart disease    Followed as Primary Care Patient/ [facility]/ [name]     . Ischemic heart disease   . Left femoral hernia s/p laparoscopic repair 05/28/2017 05/28/2017  . Lumbar disc disease   . Myocardial infarction ([place]) 2002  . Right inguinal hernia s/p laparoscopic repair 05/28/2017 05/28/2017  . Stage  3 chronic kidney disease ([place]) 05/22/2015   trial off lasix/ppi/clinoril / [place] and renal eval 05/22/2015 >>>    . T wave inversion in EKG 02/20/2014  . Unstable angina ([place]) 11/23/2013    Patient Active Problem List   Diagnosis Date Noted  . Hallucination 09/27/2019  . Right inguinal hernia s/p laparoscopic repair 05/28/2017 05/28/2017  . Left femoral hernia s/p laparoscopic repair 05/28/2017 05/28/2017  . Cerumen impaction 06/05/2015  . Stage 3 chronic kidney disease ([place]) 05/22/2015  . Arthritis 06/04/2014  . CHF (congestive heart failure), NYHA class III ([place]) 02/20/2014  . BPH (benign prostatic hyperplasia) 02/20/2014  . T wave inversion in EKG 02/20/2014  . Coronary artery disease 11/25/2013  . Essential hypertension   .  Unstable angina ([place]) 11/23/2013  . Dyspnea on exertion 11/08/2013  . Barrett's esophagus 10/27/2011  . GERD (gastroesophageal reflux disease) 07/14/2011  . CAD (coronary artery disease), native coronary artery - LIMA-LAD, SVG-Diag, SVG-RCA 2002   . Hyperlipidemia LDL goal <70   . Hypertensive heart disease     Past Surgical History:  Procedure Laterality Date  . COLONOSCOPY    . CORONARY ARTERY BYPASS GRAFT  2002   LIMA-LAD, SVG-Diag, SVG-RCA  . EYE SURGERY Bilateral 1995, 2000   ioc for cataracts  . GROIN DISSECTION  01/23/2012   Procedure: Virl Son EXPLORATION;  Surgeon: Harl Bowie, MD;  Location: [facility];  Service: General;  Laterality: Left;  Left Inguinal Exploration, release of scar tissue, Placement of Mesh  . INGUINAL HERNIA REPAIR Left 1983  . INGUINAL HERNIA REPAIR Bilateral 05/28/2017   Procedure: LAPAROSCOPIC RIGHT AND LEFT INGUINAL HERNIA REPAIR WITH MESH;  Surgeon: Michael Boston, MD;  Location: [facility];  Service: General;  Laterality: Bilateral;  . INSERTION OF MESH Bilateral 05/28/2017   Procedure: INSERTION OF MESH;  Surgeon: Michael Boston, MD;  Location: [facility];  Service: General;  Laterality: Bilateral;  . LEFT HEART CATHETERIZATION WITH CORONARY ANGIOGRAM N/A 11/24/2013   Procedure: LEFT HEART CATHETERIZATION WITH CORONARY ANGIOGRAM;  Surgeon: Peter M Martinique, MD;  Location: [facility] CATH LAB;  Service: Cardiovascular;  Laterality: N/A;  . NOSE SURGERY  1985       Family History  Problem Relation Age of Onset  . Breast cancer Mother   . Diabetes Mother   . Hypertension Father   . Dementia Father 11       early 30s  . Diabetes Sister   . COPD Brother        smoker  . Colon cancer Neg Hx   . Esophageal cancer Neg Hx   . Rectal cancer Neg Hx   . Stomach cancer Neg Hx     Social History   Tobacco Use  . Smoking status: Never Smoker  . Smokeless tobacco: Former Systems developer    Types: Secondary school teacher  . Vaping Use: Never used   Substance Use Topics  . Alcohol use: No    Alcohol/week: 0.0 standard drinks  . Drug use: No    Home Medications Prior to Admission medications   Medication Sig Start Date End Date Taking? Authorizing Provider  albuterol (VENTOLIN HFA) 108 (90 Base) MCG/ACT inhaler Inhale 2 puffs into the lungs every 6 (six) hours as needed for wheezing or shortness of breath. 02/09/20   Julian Hy, DO  aspirin (ADULT ASPIRIN EC LOW STRENGTH) 81 MG EC tablet Take 81 mg by mouth every morning.     [provider]  Cholecalciferol (VITAMIN D) 2000  UNITS tablet Take 2,000 Units by mouth daily.     [provider]  docusate sodium (COLACE) 100 MG capsule Take 1 capsule by mouth every other day    [provider]  doxazosin (CARDURA) 1 MG tablet Take 1 tablet (1 mg total) by mouth 2 (two) times daily. 02/13/20   Dettinger, Fransisca Kaufmann, MD  fluticasone (FLONASE) 50 MCG/ACT nasal spray Place 2 sprays into both nostrils daily as needed for allergies or rhinitis.     [provider]  Fluticasone-Salmeterol (ADVAIR DISKUS) 250-50 MCG/DOSE AEPB Inhale 1 puff into the lungs 2 (two) times daily. 11/01/19   Julian Hy, DO  furosemide (LASIX) 40 MG tablet Take 1 tablet (40 mg total) by mouth daily as needed for fluid or edema. 02/13/20   Dettinger, Fransisca Kaufmann, MD  metoprolol succinate (TOPROL-XL) 50 MG 24 hr tablet TAKE 1 TABLET BY MOUTH IN THE MORNING AND 1/2 TABLET IN THE EVENING 02/13/20   Dettinger, Fransisca Kaufmann, MD  Multiple Vitamin (MULTIVITAMIN WITH MINERALS) TABS Take 1 tablet by mouth daily.     [provider]  multivitamin-lutein (OCUVITE-LUTEIN) CAPS capsule Take 1 capsule by mouth daily.    [provider]  nitroGLYCERIN (NITROSTAT) 0.4 MG SL tablet Place 1 tablet (0.4 mg total) under the tongue every 5 (five) minutes as needed for chest pain (MAX 3 TABLETS). 02/11/18   Dettinger, Fransisca Kaufmann, MD  omeprazole (PRILOSEC) 20 MG capsule Take 1 capsule (20 mg total) by  mouth 2 (two) times daily. 02/13/20   Dettinger, Fransisca Kaufmann, MD  ondansetron (ZOFRAN) 4 MG tablet Take 1 tablet (4 mg total) by mouth 3 (three) times daily as needed for nausea or vomiting. 02/03/20   Mannam, Praveen, MD  ondansetron (ZOFRAN-ODT) 4 MG disintegrating tablet DISSOLVE 1 TABLET IN MOUTH EVERY 8 HOURS AS NEEDED FOR NAUSEA OR VOMITING 12/22/19   Dettinger, Fransisca Kaufmann, MD  Pirfenidone (ESBRIET) 801 MG TABS Take 1 tablet by mouth with breakfast, with lunch, and with evening meal. 01/19/20   Mannam, Praveen, MD  potassium chloride SA (KLOR-CON M20) 20 MEQ tablet TAKE ONE TABLET BY MOUTH ONCE DAILY WHEN  YOU  TAKE LASIX 08/12/17   Dettinger, Fransisca Kaufmann, MD  Simethicone (GAS-X EXTRA STRENGTH) 125 MG CAPS Take by mouth as directed. Reported on 10/10/2015 one after each meal    [provider]  simvastatin (ZOCOR) 20 MG tablet Take 1 tablet (20 mg total) by mouth at bedtime. 02/13/20   Dettinger, Fransisca Kaufmann, MD  vitamin C (ASCORBIC ACID) 500 MG tablet Take 500 mg by mouth daily.    [provider]    Allergies    Patient has no known allergies.  Review of Systems   Review of Systems  Constitutional: Negative for fever.  HENT: Negative for nosebleeds.   Eyes: Negative for pain and visual disturbance.  Respiratory: Negative for shortness of breath.   Cardiovascular: Negative for chest pain.  Gastrointestinal: Negative for abdominal pain, nausea and vomiting.  Genitourinary: Negative for flank pain.  Musculoskeletal: Negative for back pain and neck pain.  Skin: Positive for wound.  Neurological: Negative for weakness, numbness and headaches.  Hematological: Does not bruise/bleed easily.  Psychiatric/Behavioral: Negative for confusion.    Physical Exam Updated Vital Signs BP (!) 176/70 (BP Location: Left Arm)   Pulse 68   Temp 98.1 F (36.7 C) (Oral)   Resp 18   Ht 1.803 m (5\' 11" )   Wt 74.4 kg   SpO2 100%  BMI 22.87 kg/m   Physical Exam Vitals and nursing note  reviewed.  Constitutional:      Appearance: Normal appearance. He is well-developed.  HENT:     Head:     Comments: Dried blood to small area posterior scalp/hair. 3 cm laceration to posterior scalp. No fb seen or felt.     Nose: Nose normal.     Mouth/Throat:     Mouth: Mucous membranes are moist.     Pharynx: Oropharynx is clear.  Eyes:     General: No scleral icterus.    Conjunctiva/sclera: Conjunctivae normal.     Pupils: Pupils are equal, round, and reactive to light.  Neck:     Trachea: No tracheal deviation.  Cardiovascular:     Rate and Rhythm: Normal rate.     Pulses: Normal pulses.  Pulmonary:     Effort: Pulmonary effort is normal. No accessory muscle usage or respiratory distress.  Chest:     Chest wall: No tenderness.  Abdominal:     General: There is no distension.     Tenderness: There is no abdominal tenderness.  Genitourinary:    Comments: No cva tenderness. Musculoskeletal:     Cervical back: Normal range of motion and neck supple. No rigidity or tenderness.     Comments: Superficial abrasion to right knee, tenderness to area. No effusion. Knee grossly stable. No other focal pain or bony tenderness on extremity exam. Distal pulses palp bil. CTLS spine, non tender, aligned, no step off.   Skin:    General: Skin is warm and dry.     Findings: No rash.  Neurological:     Mental Status: He is alert.     Comments: Alert, speech clear. GCS 15. Motor/sens grossly intact bil. Steady gait.   Psychiatric:        Mood and Affect: Mood normal.     ED Results / Procedures / Treatments   Labs (all labs ordered are listed, but only abnormal results are displayed) Labs Reviewed - No data to display  EKG None  Radiology CT HEAD WO CONTRAST  Result Date: 02/18/2020 CLINICAL DATA:  Status post fall. EXAM: CT HEAD WITHOUT CONTRAST TECHNIQUE: Contiguous axial images were obtained from the base of the skull through the vertex without intravenous contrast.  COMPARISON:  MRI head, dated October 19, 2019 FINDINGS: Brain: There is mild cerebral atrophy with widening of the extra-axial spaces and ventricular dilatation. There are areas of decreased attenuation within the white matter tracts of the supratentorial brain, consistent with microvascular disease changes. 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 Vascular: No hyperdense vessel or unexpected calcification. Skull: Normal. Negative for fracture or focal lesion. Sinuses/Orbits: No acute finding. Other: None. IMPRESSION: No acute intracranial pathology. Electronically Signed   By: Virgina Norfolk M.D.   On: 02/18/2020 21:30   DG Knee Complete 4 Views Right  Result Date: 02/18/2020 CLINICAL DATA:  Recent fall with right knee pain, initial encounter EXAM: RIGHT KNEE - COMPLETE 4+ VIEW COMPARISON:  None. FINDINGS: Mild medial joint space narrowing is noted. No joint effusion is seen. No acute fracture or dislocation is seen. Postsurgical changes are noted consistent with prior vein harvest IMPRESSION: Degenerative changes without acute abnormality. Electronically Signed   By: Inez Catalina M.D.   On: 02/18/2020 20:55    Procedures .Marland KitchenLaceration Repair  Date/Time: 02/18/2020 8:55 PM Performed by: Lajean Saver, MD Authorized by: Lajean Saver, MD   Consent:    Consent given by:  Patient Anesthesia (see  MAR for exact dosages):    Anesthesia method:  Local infiltration   Local anesthetic:  Lidocaine 2% WITH epi Laceration details:    Location:  Scalp   Scalp location:  Occipital   Length (cm):  3 Repair type:    Repair type:  Simple Pre-procedure details:    Preparation:  Patient was prepped and draped in usual sterile fashion and imaging obtained to evaluate for foreign bodies Exploration:    Contaminated: no   Treatment:    Area cleansed with:  Saline   Amount of cleaning:  Standard   Irrigation solution:  Sterile saline Skin repair:    Repair method:  Sutures   Suture size:   4-0   Suture material:  Prolene   Number of sutures:  3 Post-procedure details:    Dressing:  Antibiotic ointment   Patient tolerance of procedure:  Tolerated well, no immediate complications   (including critical care time)  Medications Ordered in ED Medications  Tdap (BOOSTRIX) injection 0.5 mL (has no administration in time range)    ED Course  I have reviewed the triage vital signs and the nursing notes.  Pertinent labs & imaging results that were available during my care of the patient were reviewed by me and considered in my medical decision making (see chart for details).    MDM Rules/Calculators/A&P                         Xrays ordered.   Wounds cleaned, bacitracin and sterile dressing applied. Tetanus IM.  Reviewed nursing notes and prior charts for additional history.   Xrays reviewed/interpreted by me - no fx.   CT reviewed/interpreted by me - no hem.   Laceration repaired.   CTLS spine, non tender, aligned, no step off.  No new c/o. Pt comfortable.   Acetaminophen po.  Pt appears stable for d/c.      Final Clinical Impression(s) / ED Diagnoses Final diagnoses:  None    Rx / DC Orders ED Discharge Orders    None       Lajean Saver, MD 02/18/20 2144

## 2020-02-18 NOTE — ED Triage Notes (Signed)
Pushing lawnmower with wife   Brakes failed  Rolling over wife and knowing pt down hitting head and hurting R leg  Per wife ? LOC   Pt with lac to occipital area   He is neuro intact currently  Abrasion to his R knee   Unknown tetanus

## 2020-02-20 ENCOUNTER — Encounter: Payer: Self-pay | Admitting: Pulmonary Disease

## 2020-02-20 ENCOUNTER — Ambulatory Visit (INDEPENDENT_AMBULATORY_CARE_PROVIDER_SITE_OTHER): Payer: Medicare Other | Admitting: Pulmonary Disease

## 2020-02-20 ENCOUNTER — Other Ambulatory Visit: Payer: Self-pay

## 2020-02-20 VITALS — BP 116/70 | HR 67 | Temp 96.9°F | Ht 70.0 in | Wt 169.0 lb

## 2020-02-20 DIAGNOSIS — J84112 Idiopathic pulmonary fibrosis: Secondary | ICD-10-CM | POA: Diagnosis not present

## 2020-02-20 MED ORDER — ESBRIET 267 MG PO TABS: 267.0000 mg | ORAL_TABLET | Freq: Three times a day (TID) | ORAL | 0 refills | Status: DC

## 2020-02-20 MED ORDER — ESBRIET 267 MG PO TABS: 534.0000 mg | ORAL_TABLET | Freq: Three times a day (TID) | ORAL | 1 refills | Status: DC

## 2020-02-20 NOTE — Patient Instructions (Signed)
We will resume the Esbriet.  Increase to 1 tablet 3 times a day and then after 2 weeks increase to 2 tablets 3 times a day Hold at this dose until he can be reevaluated in the clinic again Continue Zofran as needed for nausea Check CMP in 1 month  Follow-up in 1 to 2 months.

## 2020-02-20 NOTE — Progress Notes (Signed)
Isaac [name]    [id number]    09/13/1940  Primary Care Physician:Dettinger, Fransisca Kaufmann, MD  Referring Physician: Dettinger, Fransisca Kaufmann, MD [place],  [place] [address]  Chief complaint:  Follow-up for IPF Started Esbriet September 5055  HPI: 79 year old with history of coronary artery disease, CHF, CKD Diagnosed with COVID-19 infection in January 2020.  Did not require hospitalization and was and was treated with monoclonal antibody as an outpatient.  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  No ongoing exposures.  No mold, hot tub, Jacuzzi.  No feather pillows or comforters Smoking history: Never smoker Travel history: No significant travel history Relevant family history: No significant family history of lung disease  Interim history: Started getting upset stomach at full dose of Esbriet.  He is held it for several days and then resumed at half a tablet a day. Currently taking Zofran.  No more episodes of nausea at lower dose  Outpatient Encounter Medications as of 02/20/2020  Medication Sig  . albuterol (VENTOLIN HFA) 108 (90 Base) MCG/ACT inhaler Inhale 2 puffs into the lungs every 6 (six) hours as needed for wheezing or shortness of breath.  [name] aspirin (ADULT ASPIRIN EC LOW STRENGTH) 81 MG EC tablet Take 81 mg by mouth every morning.   . Cholecalciferol (VITAMIN D) 2000 UNITS tablet Take 2,000 Units by mouth daily.   [name] doxazosin (CARDURA) 1 MG tablet Take 1 tablet (1 mg total) by mouth 2 (two) times daily.  . fluticasone (FLONASE) 50 MCG/ACT nasal spray Place 2 sprays into both nostrils daily as needed for  allergies or rhinitis.   . Fluticasone-Salmeterol (ADVAIR DISKUS) 250-50 MCG/DOSE AEPB Inhale 1 puff into the lungs 2 (two) times daily.  . furosemide (LASIX) 40 MG tablet Take 1 tablet (40 mg total) by mouth daily as needed for fluid or edema.  . metoprolol succinate (TOPROL-XL) 50 MG 24 hr tablet TAKE 1 TABLET BY MOUTH IN THE MORNING AND 1/2 TABLET IN THE EVENING  . Multiple Vitamin (MULTIVITAMIN WITH MINERALS) TABS Take 1 tablet by mouth daily.   . multivitamin-lutein (OCUVITE-LUTEIN) CAPS capsule Take 1 capsule by mouth daily.  . nitroGLYCERIN (NITROSTAT) 0.4 MG SL tablet Place 1 tablet (0.4 mg total) under the tongue every 5 (five) minutes as needed for chest pain (MAX 3 TABLETS).  [name] omeprazole (PRILOSEC) 20 MG capsule Take 1 capsule (20 mg total) by mouth 2 (two) times daily.  . ondansetron (ZOFRAN) 4 MG tablet Take 1 tablet (4 mg total) by mouth 3 (three) times daily as needed for nausea or vomiting.  . ondansetron (ZOFRAN-ODT) 4 MG disintegrating tablet DISSOLVE 1 TABLET IN MOUTH EVERY 8 HOURS AS NEEDED FOR NAUSEA OR VOMITING  . Pirfenidone (ESBRIET) 801 MG TABS Take 1 tablet by mouth with breakfast, with lunch, and with evening meal. (Patient taking differently: Take 1 tablet by mouth See admin instructions. Take 1 tablet by mouth daily)  . potassium chloride SA (KLOR-CON M20) 20 MEQ tablet TAKE ONE TABLET BY MOUTH ONCE DAILY WHEN  YOU  TAKE LASIX  . Simethicone (GAS-X EXTRA STRENGTH) 125 MG CAPS Take by  mouth as directed. Reported on 10/10/2015 one after each meal  . simvastatin (ZOCOR) 20 MG tablet Take 1 tablet (20 mg total) by mouth at bedtime.  . vitamin C (ASCORBIC ACID) 500 MG tablet Take 500 mg by mouth daily.  [name] docusate sodium (COLACE) 100 MG capsule Take 1 capsule by mouth every other day (Patient not taking: Reported on 02/20/2020)   No facility-administered encounter medications on file as of 02/20/2020.    Allergies as of 02/20/2020  . (No Known Allergies)    Physical  Exam: Blood pressure 116/70, pulse 67, temperature (!) 96.9 F (36.1 C), temperature source Temporal, height 5\' 10"  (1.778 m), weight 169 lb (76.7 kg), SpO2 99 %. Gen:      No acute distress HEENT:  EOMI, sclera anicteric Neck:     No masses; no thyromegaly Lungs:    Bibasal crackles CV:         Regular rate and rhythm; no murmurs Abd:      + bowel sounds; soft, non-tender; no palpable masses, no distension Ext:    No edema; adequate peripheral perfusion Skin:      Warm and dry; no rash Neuro: alert and oriented x 3 Psych: normal mood and affect  Data Reviewed: Imaging: CT abdomen pelvis 10/16/2011-mild basal atelectasis/reticulation CT abdomen pelvis 04/26/2017-basal fibrosis with honeycombing High-res CT 11/16/2019-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  PFTs: Spirometry 09/11/2016 FVC 3.0 [70%], FEV1 2.3 [72%], F/F 75 Mild restriction  01/03/2020 FVC 2.81 [69%], FEV1 2.13 [73%], F/F 76, TLC 4.38 [76%], DLCO 9.23 [45%] Mild restriction, severe diffusion defect  6-minute walk test  01/19/2020 340 m, nadir O2 sat of 94%  Labs: Hepatic panel 02/10/2020 -within normal limits proBNP 11/25/2019-1976  Cardiac: Echocardiogram 09/08/2019 LVEF 89-16%, grade 1 diastolic dysfunction, normal PA systolic pressure.  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 His recent Covid infection may have changed the trajectory of progression as he has increasing dyspnea now. He does have exposure to asbestos but that was remote and he does not have additional asbestos related finding such as pleural disease CTD serologies ordered which will be drawn today.  Give ILD questionnaire to check exposures  Has not tolerated full dose of Esbriet Continue Zofran for nausea as needed We will try to titrate up the Esbriet again up to 2 tablets 3 times a day and hold at that dose until he can be reassessed  in the office If he does not tolerate Esbriet again then consider switching to Ofev  Recent echocardiogram without pulmonary hypertension though BNP is elevated Continue monitoring  Plan/Recommendations: Continue Esbriet.  Titrate up to 2 tablets 3 times a day Check hepatic panel  Marshell Garfinkel MD [facility] 02/20/2020, 10:19 AM  CC: Dettinger, Fransisca Kaufmann, MD

## 2020-02-21 ENCOUNTER — Telehealth: Payer: Self-pay

## 2020-02-21 ENCOUNTER — Encounter: Payer: Self-pay | Admitting: Pulmonary Disease

## 2020-02-21 NOTE — Telephone Encounter (Signed)
Pt aware of appt.

## 2020-02-29 ENCOUNTER — Other Ambulatory Visit: Payer: Self-pay

## 2020-02-29 ENCOUNTER — Ambulatory Visit (INDEPENDENT_AMBULATORY_CARE_PROVIDER_SITE_OTHER): Payer: Medicare Other | Admitting: Family Medicine

## 2020-02-29 ENCOUNTER — Encounter: Payer: Self-pay | Admitting: Family Medicine

## 2020-02-29 ENCOUNTER — Other Ambulatory Visit: Payer: Self-pay | Admitting: Pharmacist

## 2020-02-29 VITALS — BP 163/71 | HR 68 | Temp 98.0°F | Ht 70.0 in | Wt 166.0 lb

## 2020-02-29 DIAGNOSIS — J849 Interstitial pulmonary disease, unspecified: Secondary | ICD-10-CM

## 2020-02-29 DIAGNOSIS — S0101XD Laceration without foreign body of scalp, subsequent encounter: Secondary | ICD-10-CM | POA: Diagnosis not present

## 2020-02-29 MED ORDER — ESBRIET 267 MG PO TABS: 534.0000 mg | ORAL_TABLET | Freq: Three times a day (TID) | ORAL | 1 refills | Status: DC

## 2020-02-29 NOTE — Progress Notes (Signed)
   BP (!) 163/71   Pulse 68   Temp 98 F (36.7 C)   Ht 5\' 10"  (1.778 m)   Wt 166 lb (75.3 kg)   SpO2 100%   BMI 23.82 kg/m    Subjective:   Patient ID: Isaac [name], male    DOB: 1940-05-17, 79 y.o.   MRN: [id number]  HPI: Isaac [name] is a 79 y.o. male presenting on 02/29/2020 for Fall (sutures to remove posterior scalp)   HPI Patient had a fall and sustained a scalp laceration on the posterior upper part of his scalp.  This occurred on 02/18/2020.  He says he was pulling a lawnmower down off a trailer and the brakes were busted and it came back down and knocked him down and then ran into his wife's knee.  He went to the ER and had stitches placed on the back of his head.  Relevant past medical, surgical, family and social history reviewed and updated as indicated. Interim medical history since our last visit reviewed. Allergies and medications reviewed and updated.  Review of Systems  Constitutional: Negative for chills and fever.  Respiratory: Negative for shortness of breath and wheezing.   Cardiovascular: Negative for chest pain and leg swelling.  Musculoskeletal: Negative for arthralgias.  Skin: Positive for wound. Negative for rash.  All other systems reviewed and are negative.   Per HPI unless specifically indicated above      Objective:   BP (!) 163/71   Pulse 68   Temp 98 F (36.7 C)   Ht 5\' 10"  (1.778 m)   Wt 166 lb (75.3 kg)   SpO2 100%   BMI 23.82 kg/m   Wt Readings from Last 3 Encounters:  02/29/20 166 lb (75.3 kg)  02/20/20 169 lb (76.7 kg)  02/18/20 164 lb (74.4 kg)    Physical Exam Vitals and nursing note reviewed.  Constitutional:      Appearance: Normal appearance. He is normal weight.  Skin:      Neurological:     Mental Status: He is alert.       Assessment & Plan:   Problem List Items Addressed This Visit    None    Visit Diagnoses    Scalp laceration, subsequent encounter    -  Primary       Follow up plan: Return  if symptoms worsen or fail to improve.  Counseling provided for all of the vaccine components No orders of the defined types were placed in this encounter.   Caryl Pina, MD [facility] 02/29/2020, 4:05 PM    Thank you

## 2020-03-01 ENCOUNTER — Other Ambulatory Visit: Payer: Self-pay | Admitting: Pharmacist

## 2020-03-01 ENCOUNTER — Other Ambulatory Visit: Payer: Self-pay | Admitting: Pulmonary Disease

## 2020-03-01 DIAGNOSIS — J849 Interstitial pulmonary disease, unspecified: Secondary | ICD-10-CM

## 2020-03-01 MED ORDER — ESBRIET 267 MG PO TABS: 534.0000 mg | ORAL_TABLET | Freq: Three times a day (TID) | ORAL | 5 refills | Status: DC

## 2020-03-01 MED FILL — ESBRIET 267 MG TABS: 267 | 30 days supply | Qty: 180 | Fill #0

## 2020-03-21 ENCOUNTER — Telehealth: Payer: Self-pay | Admitting: Family Medicine

## 2020-03-21 MED ORDER — AMLODIPINE BESYLATE 2.5 MG PO TABS: 2.5000 mg | ORAL_TABLET | Freq: Every day | ORAL | 3 refills | Status: DC

## 2020-03-21 NOTE — Telephone Encounter (Signed)
Based on patient's blood pressure readings that he brought in the other day, it does look like he is running up in the 150s most of the time up to the 170s.  I sent in amlodipine 2.5 mg for the patient and that she is taken once a day, can be morning or night.

## 2020-03-26 ENCOUNTER — Encounter: Payer: Self-pay | Admitting: Pulmonary Disease

## 2020-03-26 ENCOUNTER — Other Ambulatory Visit: Payer: Self-pay

## 2020-03-26 ENCOUNTER — Ambulatory Visit (INDEPENDENT_AMBULATORY_CARE_PROVIDER_SITE_OTHER): Payer: Medicare Other | Admitting: Pulmonary Disease

## 2020-03-26 VITALS — BP 130/62 | HR 72 | Ht 70.0 in | Wt 162.8 lb

## 2020-03-26 DIAGNOSIS — J84112 Idiopathic pulmonary fibrosis: Secondary | ICD-10-CM

## 2020-03-26 DIAGNOSIS — X32XXXA Exposure to sunlight, initial encounter: Secondary | ICD-10-CM | POA: Diagnosis not present

## 2020-03-26 DIAGNOSIS — R0602 Shortness of breath: Secondary | ICD-10-CM | POA: Diagnosis not present

## 2020-03-26 DIAGNOSIS — J849 Interstitial pulmonary disease, unspecified: Secondary | ICD-10-CM | POA: Diagnosis not present

## 2020-03-26 DIAGNOSIS — L570 Actinic keratosis: Secondary | ICD-10-CM | POA: Diagnosis not present

## 2020-03-26 LAB — COMPREHENSIVE METABOLIC PANEL
ALT: 6 U/L (ref 0–53)
AST: 10 U/L (ref 0–37)
Albumin: 3.7 g/dL (ref 3.5–5.2)
Alkaline Phosphatase: 63 U/L (ref 39–117)
BUN: 18 mg/dL (ref 6–23)
CO2: 30 mEq/L (ref 19–32)
Calcium: 8.8 mg/dL (ref 8.4–10.5)
Chloride: 105 mEq/L (ref 96–112)
Creatinine, Ser: 1.61 mg/dL — ABNORMAL HIGH (ref 0.40–1.50)
GFR: 40.49 mL/min — ABNORMAL LOW (ref >60.00)
Glucose, Bld: 86 mg/dL (ref 70–99)
Potassium: 3.9 mEq/L (ref 3.5–5.1)
Sodium: 143 mEq/L (ref 135–145)
Total Bilirubin: 0.5 mg/dL (ref 0.2–1.2)
Total Protein: 7 g/dL (ref 6.0–8.3)

## 2020-03-26 NOTE — Progress Notes (Addendum)
Isaac [name]    [id number]    November 27, 1940  Primary Care Physician:Dettinger, Fransisca Kaufmann, MD  Referring Physician: Dettinger, Fransisca Kaufmann, MD [place],  [place] [address]  Chief complaint:  Follow-up for IPF Started Esbriet September 8550  HPI: 79 year old with history of coronary artery disease, CHF, CKD Diagnosed with COVID-19 infection in January 2020.  Did not require hospitalization and was and was treated with monoclonal antibody as an outpatient.  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  No ongoing exposures.  No mold, hot tub, Jacuzzi.  No feather pillows or comforters Smoking history: Never smoker Travel history: No significant travel history Relevant family history: No significant family history of lung disease  Interim history: Tolerating Esbriet better at a lower dose of 2 tablets 3 times daily. He is taking Zofran as needed for nausea States that dyspnea on exertion is unchanged  Outpatient Encounter Medications as of 03/26/2020  Medication Sig  . albuterol (VENTOLIN HFA) 108 (90 Base) MCG/ACT inhaler Inhale 2 puffs into the lungs every 6 (six) hours as needed for wheezing or shortness of breath.  [name] amLODipine (NORVASC) 2.5 MG tablet Take 1 tablet (2.5 mg total) by mouth daily.  [name] aspirin (ADULT ASPIRIN EC LOW STRENGTH) 81 MG EC tablet Take 81 mg by mouth every morning.   . Cholecalciferol (VITAMIN D) 2000 UNITS tablet Take 2,000 Units by mouth daily.   [name] docusate sodium (COLACE)  100 MG capsule Take 1 capsule by mouth every other day  . doxazosin (CARDURA) 1 MG tablet Take 1 tablet (1 mg total) by mouth 2 (two) times daily.  . fluticasone (FLONASE) 50 MCG/ACT nasal spray Place 2 sprays into both nostrils daily as needed for allergies or rhinitis.   . Fluticasone-Salmeterol (ADVAIR DISKUS) 250-50 MCG/DOSE AEPB Inhale 1 puff into the lungs 2 (two) times daily.  . furosemide (LASIX) 40 MG tablet Take 1 tablet (40 mg total) by mouth daily as needed for fluid or edema.  . metoprolol succinate (TOPROL-XL) 50 MG 24 hr tablet TAKE 1 TABLET BY MOUTH IN THE MORNING AND 1/2 TABLET IN THE EVENING  . Multiple Vitamin (MULTIVITAMIN WITH MINERALS) TABS Take 1 tablet by mouth daily.   . multivitamin-lutein (OCUVITE-LUTEIN) CAPS capsule Take 1 capsule by mouth daily.  [name] omeprazole (PRILOSEC) 20 MG capsule Take 1 capsule (20 mg total) by mouth 2 (two) times daily.  . ondansetron (ZOFRAN) 4 MG tablet Take 1 tablet (4 mg total) by mouth 3 (three) times daily as needed for nausea or vomiting.  . ondansetron (ZOFRAN-ODT) 4 MG disintegrating tablet DISSOLVE 1 TABLET IN MOUTH EVERY 8 HOURS AS NEEDED FOR NAUSEA OR VOMITING  . Pirfenidone (ESBRIET) 267 MG TABS Take 2 tablets (534 mg total) by mouth in the morning, at noon, and at bedtime.  . potassium chloride SA (KLOR-CON M20) 20 MEQ tablet TAKE ONE TABLET BY MOUTH ONCE DAILY WHEN  YOU  TAKE LASIX  . Simethicone (GAS-X EXTRA STRENGTH)  125 MG CAPS Take by mouth as directed. Reported on 10/10/2015 one after each meal  . simvastatin (ZOCOR) 20 MG tablet Take 1 tablet (20 mg total) by mouth at bedtime.  . vitamin C (ASCORBIC ACID) 500 MG tablet Take 500 mg by mouth daily.  . nitroGLYCERIN (NITROSTAT) 0.4 MG SL tablet Place 1 tablet (0.4 mg total) under the tongue every 5 (five) minutes as needed for chest pain (MAX 3 TABLETS). (Patient not taking: Reported on 03/26/2020)  . [DISCONTINUED] Pirfenidone (ESBRIET) 801 MG TABS Take 1 tablet by mouth with  breakfast, with lunch, and with evening meal. (Patient taking differently: Take 1 tablet by mouth See admin instructions. Take 1 tablet by mouth daily)   No facility-administered encounter medications on file as of 03/26/2020.    Allergies as of 03/26/2020  . (No Known Allergies)    Physical Exam: Blood pressure 130/62, pulse 72, height 5\' 10"  (1.778 m), weight 162 lb 12.8 oz (73.8 kg), SpO2 93 %. Gen:      No acute distress HEENT:  EOMI, sclera anicteric Neck:     No masses; no thyromegaly Lungs:    Bibasal crackles CV:         Regular rate and rhythm; no murmurs Abd:      + bowel sounds; soft, non-tender; no palpable masses, no distension Ext:    No edema; adequate peripheral perfusion Skin:      Warm and dry; no rash Neuro: alert and oriented x 3 Psych: normal mood and affect  Data Reviewed: Imaging: CT abdomen pelvis 10/16/2011-mild basal atelectasis/reticulation CT abdomen pelvis 04/26/2017-basal fibrosis with honeycombing High-res CT 11/16/2019-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  PFTs: Spirometry 09/11/2016 FVC 3.0 [70%], FEV1 2.3 [72%], F/F 75 Mild restriction  01/03/2020 FVC 2.81 [69%], FEV1 2.13 [73%], F/F 76, TLC 4.38 [76%], DLCO 9.23 [45%] Mild restriction, severe diffusion defect  6-minute walk test  01/19/2020 340 m, nadir O2 sat of 94%  Labs: Hepatic panel 02/10/2020 -within normal limits proBNP 11/25/2019-1976  CTD serologies 11/25/2019-ANA 1:1280, cytoplasmic, SSA 179  Cardiac: Echocardiogram 09/08/2019 LVEF 96-22%, grade 1 diastolic dysfunction, normal PA systolic pressure.  Assessment:  Pulmonary fibrosis He has UIP pattern pulmonary fibrosis with progressive changes dating back to 2013. His recent Covid infection may have changed the trajectory of progression as he has increasing dyspnea now. He does have exposure to asbestos but that was remote and he does not have additional asbestos related  finding such as pleural disease Work-up notable for elevated ANA and SSA raising the possibility of autoimmune process.  We will make referral to rheumatology  Has not tolerated full dose of Esbriet due to nausea. He seems to be doing better now with Zofran and at reduced dose. Will make another attempt at full dose Esbriet at 3 pills 3 times daily If he does not tolerate Esbriet again then consider switching to Ofev  Recent echocardiogram without pulmonary hypertension though BNP is elevated Continue monitoring  Plan/Recommendations: Continue Esbriet.  Titrate up to 3 tablets 3 times a day Check hepatic panel, proBNP Rheumatology referral  Marshell Garfinkel MD [facility] 03/26/2020, 11:05 AM  CC: Dettinger, Fransisca Kaufmann, MD  Addendum: Reviewed note from Dr. Amil Amen, rheumatology dated 07/04/2020 Evaluated for ILD with positive ANA, SSA  Although he has dry eyes and mouth does not demonstrate any other features of systemic connective tissue disease  Repeat labs 07/04/2020 done which show SSA greater than 8, negative RNA  polymerase, Smith, scleroderma, Sjogren's, Jo 1, centromere.  No specific treatment recommended at this point.  Continue monitoring.  Marshell Garfinkel MD [facility] 07/12/2020, 10:31 AM

## 2020-03-26 NOTE — Patient Instructions (Signed)
Your blood test showed an elevation in value called ANA and SSA which may indicate an autoimmune process like lupus or Sjogren's.  I will refer you to rheumatology for further evaluation  Check comprehensive metabolic panel, proBNP today Increase Esbriet to 3 pills 3 times daily.  If you have recurrence of nausea he can come back down to 2 pills 3 times daily.  Follow-up in 3 months.

## 2020-03-27 LAB — PRO B NATRIURETIC PEPTIDE: NT-Pro BNP: 5675 pg/mL — ABNORMAL HIGH (ref 0–486)

## 2020-03-28 MED FILL — ESBRIET 267 MG TABS: 267 | 30 days supply | Qty: 180 | Fill #1

## 2020-04-11 ENCOUNTER — Ambulatory Visit (INDEPENDENT_AMBULATORY_CARE_PROVIDER_SITE_OTHER): Payer: Medicare Other | Admitting: Family Medicine

## 2020-04-11 ENCOUNTER — Encounter: Payer: Self-pay | Admitting: Family Medicine

## 2020-04-11 ENCOUNTER — Other Ambulatory Visit: Payer: Self-pay

## 2020-04-11 VITALS — BP 168/72 | HR 66 | Temp 97.4°F | Ht 70.0 in | Wt 155.0 lb

## 2020-04-11 DIAGNOSIS — Z23 Encounter for immunization: Secondary | ICD-10-CM

## 2020-04-11 DIAGNOSIS — N1831 Chronic kidney disease, stage 3a: Secondary | ICD-10-CM

## 2020-04-11 LAB — URINALYSIS
Bilirubin, UA: NEGATIVE
Glucose, UA: NEGATIVE
Ketones, UA: NEGATIVE
Leukocytes,UA: NEGATIVE
Nitrite, UA: NEGATIVE
RBC, UA: NEGATIVE
Specific Gravity, UA: 1.015 (ref 1.005–1.030)
Urobilinogen, Ur: 0.2 mg/dL (ref 0.2–1.0)
pH, UA: 8.5 — ABNORMAL HIGH (ref 5.0–7.5)

## 2020-04-11 NOTE — Patient Instructions (Signed)
Referred to Dr Theador Hawthorne for kidneys Address: [address] [place], [place] [address] Phone: [phone]  You had labs performed today.  You will be contacted with the results of the labs once they are available, usually in the next 3 business days for routine lab work.  If you have an active my chart account, they will be released to your MyChart.  If you prefer to have these labs released to you via telephone, please let us know.  If you had a pap smear or biopsy performed, expect to be contacted in about 7-10 days.

## 2020-04-11 NOTE — [facility] Notes (Signed)
Subjective: CC: Renal disease PCP: Dettinger, Fransisca Kaufmann, MD JJK:KXFGH Isaac [name] is a 79 y.o. male presenting to clinic today for:  1.  Renal disease Patient is accompanied today's visit by his spouse.  She notes that they recently saw the pulmonologist who was worried about the patient's kidneys and recommended that he follow-up with PCP for further evaluation and management.  He reports good urine output.  He only uses the Lasix very sparingly, less than 1 time per week.  Does not report any significant edema.  He does have a history of CAD and CHF.  He is followed by Dr. Lovena Le for this.  No reports of NSAID use.  He does take Norvasc 2.5 mg daily but is not on an ARB or ACE inhibitor.   ROS: Per HPI  No Known Allergies Past Medical History:  Diagnosis Date  . Allergic rhinitis   . Allergy   . Arthritis 06/04/2014   Trial of sulindac 05/30/14 since cri risk with other nsaids   . Barrett's esophagus 10/27/2011  . BPH (benign prostatic hyperplasia) 02/20/2014  . CAD (coronary artery disease) of artery bypass graft, Occluded VG-non dominant LCX medical therapy 11/25/2013  . CAD (coronary artery disease), native coronary artery - LIMA-LAD, SVG-Diag, SVG-RCA 2002    Catheterization 2002 showing occluded LAD, 99% diagonal, occluded distal circumflex, 90% nondominant right coronary artery  CABG 08/06/00 by Dr. Nils Pyle with LIMA to LAD, SVG to diagonal, SVG to right coronary artery, circumflex was noted to be too small to graft    . CAP (community acquired pneumonia) 04/16/2016   See cxr 04/15/16 > treated with 7 days of Levaquin 500 mg daily with resolution radiographically and clinically.  . Cataract   . Cerumen impaction 06/05/2015  . CHF (congestive heart failure), NYHA class III ([place]) 02/20/2014  . Dyspnea    with exertion   . Dyspnea on exertion 11/08/2013   Followed as Primary Care Patient/ [facility]/ [name]  - new onset early June 2015  - 11/08/2013  Walked RA x 3 laps @ 185 ft each  stopped due to  End of study, mild sob, no cp and no desat, EKG ok  - Cardiac w/u inconclusive but not clearly having ischemia  11/24/13   - 09/11/2016  Walked RA x 3 laps @ 185 ft each stopped due to  End of study, nl pace, no sob or desat   - Spirometry 09/11/2016    . Essential hypertension    Referred to [facility] 12/12/2016   . GERD (gastroesophageal reflux disease)   . H/O hiatal hernia   . History of skin cancer    bilateral arms with removal  . Hyperlipidemia   . Hyperlipidemia LDL goal <70    Followed as Primary Care Patient/ [facility]/ [name]     - Target LDL < 70 as has IHD    . Hypertension   . Hypertensive heart disease    Followed as Primary Care Patient/ [facility]/ [name]     . Ischemic heart disease   . Left femoral hernia s/p laparoscopic repair 05/28/2017 05/28/2017  . Lumbar disc disease   . Myocardial infarction ([place]) 2002  . Right inguinal hernia s/p laparoscopic repair 05/28/2017 05/28/2017  . Stage 3 chronic kidney disease ([place]) 05/22/2015   trial off lasix/ppi/clinoril / [place] and renal eval 05/22/2015 >>>    . T wave inversion in EKG 02/20/2014  . Unstable angina ([place]) 11/23/2013    Current Outpatient Medications:  .  albuterol (VENTOLIN HFA) 108 (90 Base) MCG/ACT inhaler, Inhale 2 puffs into the lungs every 6 (six) hours as needed for wheezing or shortness of breath., Disp: 18 g, Rfl: 3 .  amLODipine (NORVASC) 2.5 MG tablet, Take 1 tablet (2.5 mg total) by mouth daily., Disp: 90 tablet, Rfl: 3 .  aspirin (ADULT ASPIRIN EC LOW STRENGTH) 81 MG EC tablet, Take 81 mg by mouth every morning. , Disp: , Rfl:  .  Cholecalciferol (VITAMIN D) 2000 UNITS tablet, Take 2,000 Units by mouth daily. , Disp: , Rfl:  .  docusate sodium (COLACE) 100 MG capsule, Take 1 capsule by mouth every other day, Disp: , Rfl:  .  doxazosin (CARDURA) 1 MG tablet, Take 1 tablet (1 mg total) by mouth 2 (two) times daily., Disp: 180 tablet, Rfl: 3 .  fluticasone (FLONASE) 50 MCG/ACT nasal  spray, Place 2 sprays into both nostrils daily as needed for allergies or rhinitis. , Disp: , Rfl:  .  Fluticasone-Salmeterol (ADVAIR DISKUS) 250-50 MCG/DOSE AEPB, Inhale 1 puff into the lungs 2 (two) times daily., Disp: 60 each, Rfl: 11 .  furosemide (LASIX) 40 MG tablet, Take 1 tablet (40 mg total) by mouth daily as needed for fluid or edema., Disp: 30 tablet, Rfl: 5 .  metoprolol succinate (TOPROL-XL) 50 MG 24 hr tablet, TAKE 1 TABLET BY MOUTH IN THE MORNING AND 1/2 TABLET IN THE EVENING, Disp: 135 tablet, Rfl: 3 .  Multiple Vitamin (MULTIVITAMIN WITH MINERALS) TABS, Take 1 tablet by mouth daily. , Disp: , Rfl:  .  multivitamin-lutein (OCUVITE-LUTEIN) CAPS capsule, Take 1 capsule by mouth daily., Disp: , Rfl:  .  nitroGLYCERIN (NITROSTAT) 0.4 MG SL tablet, Place 1 tablet (0.4 mg total) under the tongue every 5 (five) minutes as needed for chest pain (MAX 3 TABLETS). (Patient not taking: Reported on 03/26/2020), Disp: 10 tablet, Rfl: 6 .  omeprazole (PRILOSEC) 20 MG capsule, Take 1 capsule (20 mg total) by mouth 2 (two) times daily., Disp: 180 capsule, Rfl: 3 .  ondansetron (ZOFRAN) 4 MG tablet, Take 1 tablet (4 mg total) by mouth 3 (three) times daily as needed for nausea or vomiting., Disp: 30 tablet, Rfl: 1 .  ondansetron (ZOFRAN-ODT) 4 MG disintegrating tablet, DISSOLVE 1 TABLET IN MOUTH EVERY 8 HOURS AS NEEDED FOR NAUSEA OR VOMITING, Disp: 20 tablet, Rfl: 0 .  Pirfenidone (ESBRIET) 267 MG TABS, Take 2 tablets (534 mg total) by mouth in the morning, at noon, and at bedtime., Disp: 180 tablet, Rfl: 5 .  potassium chloride SA (KLOR-CON M20) 20 MEQ tablet, TAKE ONE TABLET BY MOUTH ONCE DAILY WHEN  YOU  TAKE LASIX, Disp: 30 tablet, Rfl: 5 .  Simethicone (GAS-X EXTRA STRENGTH) 125 MG CAPS, Take by mouth as directed. Reported on 10/10/2015 one after each meal, Disp: , Rfl:  .  simvastatin (ZOCOR) 20 MG tablet, Take 1 tablet (20 mg total) by mouth at bedtime., Disp: 90 tablet, Rfl: 3 .  vitamin C  (ASCORBIC ACID) 500 MG tablet, Take 500 mg by mouth daily., Disp: , Rfl:  Social History   Socioeconomic History  . Marital status: Married    Spouse name: [name]  . Number of children: 0  . Years of education: Not on file  . Highest education level: 11th grade  Occupational History  . Occupation: Retired    Fish farm manager: [employer]  Tobacco Use  . Smoking status: Never Smoker  . Smokeless tobacco: Former Systems developer    Types: Secondary school teacher  .  Vaping Use: Never used  Substance and Sexual Activity  . Alcohol use: No    Alcohol/week: 0.0 standard drinks  . Drug use: No  . Sexual activity: Never  Other Topics Concern  . Not on file  Social History Narrative   Remarried in July 31, 2003 after first wife died of emphysema in 30-Jul-2001. 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 He lives in a one story home with his wife. They raising 2 great grandchildren. One is 57 months old and the other is 79 years old. He has no biological children.    Social Determinants of Health   Financial Resource Strain: Not on file  Food Insecurity: Not on file  Transportation Needs: Not on file  Physical Activity: Not on file  Stress: Not on file  Social Connections: Not on file  Intimate Partner Violence: Not on file   Family History  Problem Relation Age of Onset  . Breast cancer Mother   . Diabetes Mother   . Hypertension Father   . Dementia Father 79       early 53s  . Diabetes Sister   . COPD Brother        smoker  . Colon cancer Neg Hx   . Esophageal cancer Neg Hx   . Rectal cancer Neg Hx   . Stomach cancer Neg Hx     Objective: Office vital signs reviewed. BP (!) 168/72   Pulse 66   Temp (!) 97.4 F (36.3 C) (Temporal)   Ht 5\' 10"  (1.778 m)   Wt 155 lb (70.3 kg)   SpO2 97%   BMI 22.24 kg/m   Physical Examination:  General: Awake, alert, frail, elderly male, No acute distress HEENT: Normal;  sclera white Cardio: regular rate and rhythm, S1S2 heard, no murmurs appreciated Pulm: clear to auscultation bilaterally, no wheezes, rhonchi or rales; normal work of breathing on room air  Assessment/ Plan: 79 y.o. male   Stage 3a chronic kidney disease ([place]) - Plan: Urinalysis, Ambulatory referral to Nephrology, VITAMIN D 25 Hydroxy (Vit-D Deficiency, Fractures), Renal Function Panel, CBC, CANCELED: CBC, CANCELED: Renal Function Panel, CANCELED: VITAMIN D 25 Hydroxy (Vit-D Deficiency, Fractures)  Looks like he has had stage III chronic kidney disease for quite some time now.  Previously referred to renal but he was unable to find of the office and so never had the work-up completed.  We will check a renal function panel today, vitamin D, CBC and urinalysis and a new referral has been placed.  I have given him the name, address and telephone number of the nephrologist to hopefully they will actually be able to touch base with him this time.  We spent quite a bit of time discussing things that can impact renal function including NSAIDs, uncontrolled blood pressure, sugar and certain infections.  I suspect that his is more vascular in nature given known cardiac vascular pathology.  I stressed the importance of blood pressure control and encouraged him to take his blood pressure medications when he got home, as this was not done prior to today's visit.  Hence the uncontrolled blood pressure.  Additionally, his Levan Hurst booster was provided today.  He will follow-up as needed with PCP as scheduled  No orders of the defined types were placed in this encounter.  No orders of the defined types were placed in this encounter.    Janora Norlander, DO [place] [phone]

## 2020-04-11 NOTE — [facility] Notes (Signed)
   Covid-19 Vaccination Clinic  Name:  Isaac [name]    MRN: [id number] DOB: Sep 04, 1940  04/11/2020  [name] was observed post Covid-19 immunization for 15 minutes without incident. He was provided with Vaccine Information Sheet and instruction to access the V-Safe system.   [name] was instructed to call 911 with any severe reactions post vaccine: [name] Difficulty breathing  . Swelling of face and throat  . A fast heartbeat  . A bad rash all over body  . Dizziness and weakness

## 2020-04-12 LAB — CBC
Hematocrit: 38.5 % (ref 37.5–51.0)
Hemoglobin: 13 g/dL (ref 13.0–17.7)
MCH: 30.7 pg (ref 26.6–33.0)
MCHC: 33.8 g/dL (ref 31.5–35.7)
MCV: 91 fL (ref 79–97)
Platelets: 153 10*3/uL (ref 150–450)
RBC: 4.24 x10E6/uL (ref 4.14–5.80)
RDW: 13.4 % (ref 11.6–15.4)
WBC: 7.8 10*3/uL (ref 3.4–10.8)

## 2020-04-12 LAB — RENAL FUNCTION PANEL
Albumin: 4 g/dL (ref 3.7–4.7)
BUN/Creatinine Ratio: 12 (ref 10–24)
BUN: 15 mg/dL (ref 8–27)
CO2: 25 mmol/L (ref 20–29)
Calcium: 8.9 mg/dL (ref 8.6–10.2)
Chloride: 100 mmol/L (ref 96–106)
Creatinine, Ser: 1.26 mg/dL (ref 0.76–1.27)
GFR calc Af Amer: 62 mL/min/{1.73_m2} (ref >59)
GFR calc non Af Amer: 54 mL/min/{1.73_m2} — ABNORMAL LOW (ref >59)
Glucose: 101 mg/dL — ABNORMAL HIGH (ref 65–99)
Phosphorus: 2.6 mg/dL — ABNORMAL LOW (ref 2.8–4.1)
Potassium: 4 mmol/L (ref 3.5–5.2)
Sodium: 142 mmol/L (ref 134–144)

## 2020-04-12 LAB — VITAMIN D 25 HYDROXY (VIT D DEFICIENCY, FRACTURES): Vit D, 25-Hydroxy: 57.3 ng/mL (ref 30.0–100.0)

## 2020-05-01 ENCOUNTER — Other Ambulatory Visit (HOSPITAL_COMMUNITY): Payer: Self-pay | Admitting: Nephrology

## 2020-05-01 ENCOUNTER — Other Ambulatory Visit: Payer: Self-pay | Admitting: Nephrology

## 2020-05-01 DIAGNOSIS — I129 Hypertensive chronic kidney disease with stage 1 through stage 4 chronic kidney disease, or unspecified chronic kidney disease: Secondary | ICD-10-CM | POA: Diagnosis not present

## 2020-05-01 DIAGNOSIS — I5032 Chronic diastolic (congestive) heart failure: Secondary | ICD-10-CM | POA: Diagnosis not present

## 2020-05-01 DIAGNOSIS — R768 Other specified abnormal immunological findings in serum: Secondary | ICD-10-CM | POA: Diagnosis not present

## 2020-05-01 DIAGNOSIS — Z79899 Other long term (current) drug therapy: Secondary | ICD-10-CM | POA: Diagnosis not present

## 2020-05-01 DIAGNOSIS — N1832 Chronic kidney disease, stage 3b: Secondary | ICD-10-CM | POA: Diagnosis not present

## 2020-05-01 DIAGNOSIS — R809 Proteinuria, unspecified: Secondary | ICD-10-CM | POA: Diagnosis not present

## 2020-05-01 DIAGNOSIS — E559 Vitamin D deficiency, unspecified: Secondary | ICD-10-CM | POA: Diagnosis not present

## 2020-05-01 MED FILL — ESBRIET 267 MG TABS: 267 | 30 days supply | Qty: 180 | Fill #2

## 2020-05-07 DIAGNOSIS — Z79899 Other long term (current) drug therapy: Secondary | ICD-10-CM | POA: Diagnosis not present

## 2020-05-07 DIAGNOSIS — N1832 Chronic kidney disease, stage 3b: Secondary | ICD-10-CM | POA: Diagnosis not present

## 2020-05-07 DIAGNOSIS — E559 Vitamin D deficiency, unspecified: Secondary | ICD-10-CM | POA: Diagnosis not present

## 2020-05-07 DIAGNOSIS — I129 Hypertensive chronic kidney disease with stage 1 through stage 4 chronic kidney disease, or unspecified chronic kidney disease: Secondary | ICD-10-CM | POA: Diagnosis not present

## 2020-05-07 DIAGNOSIS — R768 Other specified abnormal immunological findings in serum: Secondary | ICD-10-CM | POA: Diagnosis not present

## 2020-05-07 DIAGNOSIS — I5032 Chronic diastolic (congestive) heart failure: Secondary | ICD-10-CM | POA: Diagnosis not present

## 2020-05-15 ENCOUNTER — Other Ambulatory Visit: Payer: Self-pay

## 2020-05-15 ENCOUNTER — Ambulatory Visit (HOSPITAL_COMMUNITY)
Admission: RE | Admit: 2020-05-15 | Discharge: 2020-05-15 | Disposition: A | Discharge status: Home / Self Care | Payer: Medicare Other | Source: Ambulatory Visit | Attending: Nephrology | Admitting: Nephrology

## 2020-05-15 DIAGNOSIS — N1832 Chronic kidney disease, stage 3b: Secondary | ICD-10-CM | POA: Insufficient documentation

## 2020-05-15 DIAGNOSIS — N200 Calculus of kidney: Secondary | ICD-10-CM | POA: Diagnosis not present

## 2020-05-31 DIAGNOSIS — N1832 Chronic kidney disease, stage 3b: Secondary | ICD-10-CM | POA: Diagnosis not present

## 2020-05-31 DIAGNOSIS — R7303 Prediabetes: Secondary | ICD-10-CM | POA: Diagnosis not present

## 2020-05-31 DIAGNOSIS — I5032 Chronic diastolic (congestive) heart failure: Secondary | ICD-10-CM | POA: Diagnosis not present

## 2020-05-31 DIAGNOSIS — R768 Other specified abnormal immunological findings in serum: Secondary | ICD-10-CM | POA: Diagnosis not present

## 2020-05-31 DIAGNOSIS — I129 Hypertensive chronic kidney disease with stage 1 through stage 4 chronic kidney disease, or unspecified chronic kidney disease: Secondary | ICD-10-CM | POA: Diagnosis not present

## 2020-06-14 MED FILL — ESBRIET 267 MG TABS: 267 | 30 days supply | Qty: 180 | Fill #3

## 2020-07-04 DIAGNOSIS — R768 Other specified abnormal immunological findings in serum: Secondary | ICD-10-CM | POA: Diagnosis not present

## 2020-07-04 DIAGNOSIS — J8410 Pulmonary fibrosis, unspecified: Secondary | ICD-10-CM | POA: Diagnosis not present

## 2020-07-06 ENCOUNTER — Encounter: Payer: Self-pay | Admitting: Family Medicine

## 2020-07-06 ENCOUNTER — Other Ambulatory Visit: Payer: Self-pay

## 2020-07-06 ENCOUNTER — Ambulatory Visit (INDEPENDENT_AMBULATORY_CARE_PROVIDER_SITE_OTHER): Payer: Medicare Other | Admitting: Family Medicine

## 2020-07-06 VITALS — BP 134/68 | HR 62 | Ht 70.0 in | Wt 158.0 lb

## 2020-07-06 DIAGNOSIS — K219 Gastro-esophageal reflux disease without esophagitis: Secondary | ICD-10-CM

## 2020-07-06 DIAGNOSIS — I5042 Chronic combined systolic (congestive) and diastolic (congestive) heart failure: Secondary | ICD-10-CM | POA: Diagnosis not present

## 2020-07-06 DIAGNOSIS — R413 Other amnesia: Secondary | ICD-10-CM | POA: Diagnosis not present

## 2020-07-06 DIAGNOSIS — E785 Hyperlipidemia, unspecified: Secondary | ICD-10-CM

## 2020-07-06 DIAGNOSIS — N1832 Chronic kidney disease, stage 3b: Secondary | ICD-10-CM

## 2020-07-06 DIAGNOSIS — I110 Hypertensive heart disease with heart failure: Secondary | ICD-10-CM | POA: Diagnosis not present

## 2020-07-06 DIAGNOSIS — I10 Essential (primary) hypertension: Secondary | ICD-10-CM

## 2020-07-06 DIAGNOSIS — R443 Hallucinations, unspecified: Secondary | ICD-10-CM

## 2020-07-06 MED ORDER — FUROSEMIDE 40 MG PO TABS: 40.0000 mg | ORAL_TABLET | Freq: Every day | ORAL | 3 refills | Status: DC | PRN

## 2020-07-06 NOTE — [facility] Notes (Signed)
BP 134/68   Pulse 62   Ht 5' 10"  (1.778 m)   Wt 158 lb (71.7 kg)   SpO2 99%   BMI 22.67 kg/m    Subjective:   Patient ID: Isaac [name], male    DOB: [birth year], 80 y.o.   MRN: [id number]  HPI: Isaac [name] is a 80 y.o. male presenting on 07/06/2020 for Medical Management of Chronic Issues and Hypertension   HPI Hypertension Patient is currently on doxazosin and amlodipine and furosemide and metoprolol, and their blood pressure today is 134/68. Patient denies any lightheadedness or dizziness. Patient denies headaches, blurred vision, chest pains, shortness of breath, or weakness. Denies any side effects from medication and is content with current medication.   Hyperlipidemia Patient is coming in for recheck of his hyperlipidemia. The patient is currently taking simvastatin. They deny any issues with myalgias or history of liver damage from it. They deny any focal numbness or weakness or chest pain.   GERD Patient is currently on omeprazole.  She denies any major symptoms or abdominal pain or belching or burping. She denies any blood in her stool or lightheadedness or dizziness.   Patient has been having increased memory issues and started seeing things that are not there, he is seen his granddaughter does live with him anymore at different times per his wife.  Relevant past medical, surgical, family and social history reviewed and updated as indicated. Interim medical history since our last visit reviewed. Allergies and medications reviewed and updated.  Review of Systems  Constitutional: Negative for chills and fever.  Respiratory: Negative for shortness of breath and wheezing.   Cardiovascular: Negative for chest pain and leg swelling.  Musculoskeletal: Negative for back pain and gait problem.  Skin: Negative for rash.  Neurological: Negative for dizziness, weakness and light-headedness.  All other systems reviewed and are negative.   Per HPI unless specifically indicated  above   Allergies as of 07/06/2020   No Known Allergies     Medication List       Accurate as of July 06, 2020  1:08 PM. If you have any questions, ask your nurse or doctor.        albuterol 108 (90 Base) MCG/ACT inhaler Commonly known as: VENTOLIN HFA Inhale 2 puffs into the lungs every 6 (six) hours as needed for wheezing or shortness of breath.   amLODipine 2.5 MG tablet Commonly known as: NORVASC Take 1 tablet (2.5 mg total) by mouth daily.   aspirin 81 MG EC tablet Take 81 mg by mouth every morning.   docusate sodium 100 MG capsule Commonly known as: COLACE Take 1 capsule by mouth every other day   doxazosin 1 MG tablet Commonly known as: Cardura Take 1 tablet (1 mg total) by mouth 2 (two) times daily.   Esbriet 267 MG Tabs Generic drug: Pirfenidone Take 2 tablets (534 mg total) by mouth in the morning, at noon, and at bedtime.   fluticasone 50 MCG/ACT nasal spray Commonly known as: FLONASE Place 2 sprays into both nostrils daily as needed for allergies or rhinitis.   Fluticasone-Salmeterol 250-50 MCG/DOSE Aepb Commonly known as: Advair Diskus Inhale 1 puff into the lungs 2 (two) times daily.   furosemide 40 MG tablet Commonly known as: LASIX Take 1 tablet (40 mg total) by mouth daily as needed for fluid or edema.   metoprolol succinate 50 MG 24 hr tablet Commonly known as: TOPROL-XL TAKE 1 TABLET BY MOUTH IN THE MORNING AND 1/2 TABLET  IN THE EVENING   multivitamin with minerals Tabs tablet Take 1 tablet by mouth daily.   multivitamin-lutein Caps capsule Take 1 capsule by mouth daily.   nitroGLYCERIN 0.4 MG SL tablet Commonly known as: NITROSTAT Place 1 tablet (0.4 mg total) under the tongue every 5 (five) minutes as needed for chest pain (MAX 3 TABLETS).   omeprazole 20 MG capsule Commonly known as: PRILOSEC Take 1 capsule (20 mg total) by mouth 2 (two) times daily.   ondansetron 4 MG disintegrating tablet Commonly known as:  ZOFRAN-ODT DISSOLVE 1 TABLET IN MOUTH EVERY 8 HOURS AS NEEDED FOR NAUSEA OR VOMITING   ondansetron 4 MG tablet Commonly known as: Zofran Take 1 tablet (4 mg total) by mouth 3 (three) times daily as needed for nausea or vomiting.   potassium chloride SA 20 MEQ tablet Commonly known as: [place]-Con M20 TAKE ONE TABLET BY MOUTH ONCE DAILY WHEN  YOU  TAKE LASIX   Simethicone 125 MG Caps Take by mouth as directed. Reported on 10/10/2015 one after each meal   simvastatin 20 MG tablet Commonly known as: ZOCOR Take 1 tablet (20 mg total) by mouth at bedtime.   vitamin C 500 MG tablet Commonly known as: ASCORBIC ACID Take 500 mg by mouth daily.   Vitamin D 50 MCG (2000 UT) tablet Take 2,000 Units by mouth daily.        Objective:   BP 134/68   Pulse 62   Ht 5' 10"  (1.778 m)   Wt 158 lb (71.7 kg)   SpO2 99%   BMI 22.67 kg/m   Wt Readings from Last 3 Encounters:  07/06/20 158 lb (71.7 kg)  04/11/20 155 lb (70.3 kg)  03/26/20 162 lb 12.8 oz (73.8 kg)    Physical Exam Vitals and nursing note reviewed.  Constitutional:      General: He is not in acute distress.    Appearance: He is well-developed. He is not diaphoretic.  Eyes:     General: No scleral icterus.    Conjunctiva/sclera: Conjunctivae normal.  Neck:     Thyroid: No thyromegaly.  Cardiovascular:     Rate and Rhythm: Normal rate and regular rhythm.     Heart sounds: Normal heart sounds. No murmur heard.   Pulmonary:     Effort: Pulmonary effort is normal. No respiratory distress.     Breath sounds: Normal breath sounds. No wheezing.  Musculoskeletal:     Cervical back: Neck supple.  Lymphadenopathy:     Cervical: No cervical adenopathy.  Skin:    General: Skin is warm and dry.     Findings: No rash.  Neurological:     Mental Status: He is alert and oriented to person, place, and time.     Coordination: Coordination normal.  Psychiatric:        Behavior: Behavior normal.     MMSE - Mini Mental State  Exam 09/27/2019 05/19/2017  Orientation to time 5 5  Orientation to Place 5 5  Registration 3 3  Attention/ Calculation 2 5  Recall 1 1  Language- name 2 objects 2 2  Language- repeat 1 1  Language- follow 3 step command 3 3  Language- read & follow direction 1 1  Write a sentence 1 1  Copy design 0 1  Total score 24 28     Assessment & Plan:   Problem List Items Addressed This Visit      Cardiovascular and Mediastinum   Hypertensive heart disease (Chronic)   Relevant  Medications   furosemide (LASIX) 40 MG tablet   Other Relevant Orders   CMP14+EGFR   Essential hypertension   Relevant Medications   furosemide (LASIX) 40 MG tablet     Digestive   GERD (gastroesophageal reflux disease) (Chronic)   Relevant Orders   CBC with Differential/Platelet     Genitourinary   Stage 3 chronic kidney disease (HCC)   Relevant Orders   CMP14+EGFR     Other   Hyperlipidemia LDL goal <70 - Primary (Chronic)   Relevant Medications   furosemide (LASIX) 40 MG tablet   Other Relevant Orders   Lipid panel    Other Visit Diagnoses    Memory change       Relevant Orders   Ambulatory referral to Neurology   Hallucinations       Relevant Orders   Ambulatory referral to Neurology      Patient has dementia but now having hallucinations, concerned that it may not be Alzheimer's but it may be some other form of dementia, will send to neurology. Follow up plan: Return in about 6 months (around 01/06/2021), or if symptoms worsen or fail to improve, for Hypertension recheck.  Counseling provided for all of the vaccine components No orders of the defined types were placed in this encounter.   Caryl Pina, MD [facility] 07/06/2020, 1:08 PM

## 2020-07-07 LAB — LIPID PANEL
Chol/HDL Ratio: 3.3 ratio (ref 0.0–5.0)
Cholesterol, Total: 178 mg/dL (ref 100–199)
HDL: 54 mg/dL (ref >39)
LDL Chol Calc (NIH): 95 mg/dL (ref 0–99)
Triglycerides: 166 mg/dL — ABNORMAL HIGH (ref 0–149)
VLDL Cholesterol Cal: 29 mg/dL (ref 5–40)

## 2020-07-07 LAB — CBC WITH DIFFERENTIAL/PLATELET
Basophils Absolute: 0.1 10*3/uL (ref 0.0–0.2)
Basos: 1 %
EOS (ABSOLUTE): 0.3 10*3/uL (ref 0.0–0.4)
Eos: 3 %
Hematocrit: 39 % (ref 37.5–51.0)
Hemoglobin: 13 g/dL (ref 13.0–17.7)
Immature Grans (Abs): 0 10*3/uL (ref 0.0–0.1)
Immature Granulocytes: 0 %
Lymphocytes Absolute: 2.7 10*3/uL (ref 0.7–3.1)
Lymphs: 29 %
MCH: 29.9 pg (ref 26.6–33.0)
MCHC: 33.3 g/dL (ref 31.5–35.7)
MCV: 90 fL (ref 79–97)
Monocytes Absolute: 0.8 10*3/uL (ref 0.1–0.9)
Monocytes: 9 %
Neutrophils Absolute: 5.3 10*3/uL (ref 1.4–7.0)
Neutrophils: 58 %
Platelets: 170 10*3/uL (ref 150–450)
RBC: 4.35 x10E6/uL (ref 4.14–5.80)
RDW: 14.3 % (ref 11.6–15.4)
WBC: 9.2 10*3/uL (ref 3.4–10.8)

## 2020-07-07 LAB — CMP14+EGFR
ALT: 8 IU/L (ref 0–44)
AST: 12 IU/L (ref 0–40)
Albumin/Globulin Ratio: 1.4 (ref 1.2–2.2)
Albumin: 4.3 g/dL (ref 3.7–4.7)
Alkaline Phosphatase: 97 IU/L (ref 44–121)
BUN/Creatinine Ratio: 16 (ref 10–24)
BUN: 24 mg/dL (ref 8–27)
Bilirubin Total: 0.3 mg/dL (ref 0.0–1.2)
CO2: 22 mmol/L (ref 20–29)
Calcium: 8.8 mg/dL (ref 8.6–10.2)
Chloride: 103 mmol/L (ref 96–106)
Creatinine, Ser: 1.47 mg/dL — ABNORMAL HIGH (ref 0.76–1.27)
Globulin, Total: 3 g/dL (ref 1.5–4.5)
Glucose: 89 mg/dL (ref 65–99)
Potassium: 4.8 mmol/L (ref 3.5–5.2)
Sodium: 140 mmol/L (ref 134–144)
Total Protein: 7.3 g/dL (ref 6.0–8.5)
eGFR: 48 mL/min/{1.73_m2} — ABNORMAL LOW (ref >59)

## 2020-07-16 ENCOUNTER — Telehealth: Payer: Self-pay | Admitting: Neurology

## 2020-07-16 ENCOUNTER — Encounter: Payer: Self-pay | Admitting: Neurology

## 2020-07-16 ENCOUNTER — Ambulatory Visit (INDEPENDENT_AMBULATORY_CARE_PROVIDER_SITE_OTHER): Payer: Medicare Other | Admitting: Neurology

## 2020-07-16 VITALS — BP 139/78 | HR 69 | Ht 70.5 in | Wt 157.0 lb

## 2020-07-16 DIAGNOSIS — G309 Alzheimer's disease, unspecified: Secondary | ICD-10-CM | POA: Diagnosis not present

## 2020-07-16 DIAGNOSIS — R413 Other amnesia: Secondary | ICD-10-CM

## 2020-07-16 DIAGNOSIS — F0280 Dementia in other diseases classified elsewhere without behavioral disturbance: Secondary | ICD-10-CM

## 2020-07-16 MED ORDER — MEMANTINE HCL 10 MG PO TABS: 10.0000 mg | ORAL_TABLET | Freq: Two times a day (BID) | ORAL | 3 refills | Status: AC

## 2020-07-16 MED ORDER — MEMANTINE HCL 28 X 5 MG & 21 X 10 MG PO TABS: ORAL_TABLET | ORAL | 12 refills | Status: DC

## 2020-07-16 NOTE — Telephone Encounter (Signed)
LVM for patient to return call.  I have blocked 5/31 @ 0930 for patient if they accept.

## 2020-07-16 NOTE — Progress Notes (Signed)
[facility] [address] [place]. [place] [address] [phone]       OFFICE CONSULT NOTE  [name] Date of Birth:  1940-11-18 Medical Record Number:  [id number]   Referring MD: Vonna Kotyk Dettinger PA-C Reason for Referral: Memory loss  HPI: [name] is a 80 year old pleasant Caucasian male seen today for initial office consultation visit for memory loss.  He is accompanied by his wife Isaac [name].  History is obtained from them and review of electronic medical records personally reviewed pertinent imaging films in PACS.  He has past medical history of coronary artery disease, benign prostatic hypertrophy, congestive heart failure, hiatal hernia and gastroesophageal reflux disease and hypertension.  He has had greater than 1 year history of worsening short-term memory and cognitive difficulties.  He often misplaces objects.  He was also been having some auditory as well as sometimes visual hallucinations.  He has trouble completing sentences and at times searches for words.  Is mostly however independent in actives of daily living.  Patient does have bad congestive heart failure and cannot walk long distances.  He does have family history of Alzheimer's and dad died in the 8s.  His Mini-Mental status exam score today was 26/30 in the past it has been documented as being 28 and 26 over the last 3 years..  He has not tried medications like Aricept or Namenda yet.  He did have vitamin B12, TSH and RPR checked on 09/27/2019 which were normal.  MRI scan of the brain on 10/20/2019 showed asymmetric atrophy of the right hippocampus.  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  Has dilated CSF spaces.  CT scan of the head on 02/18/2020 was unremarkable.  He has not had an EEG done yet.  ROS:   14 system review of systems is positive for memory loss, misplacing things, auditory and visual hallucinations, shortness of breath, decreased hearing all other  systems negative  PMH:  Past Medical History:  Diagnosis Date  . Allergic rhinitis   . Allergy   . Arthritis 06/04/2014   Trial of sulindac 05/30/14 since cri risk with other nsaids   . Barrett's esophagus 10/27/2011  . BPH (benign prostatic hyperplasia) 02/20/2014  . CAD (coronary artery disease) of artery bypass graft, Occluded VG-non dominant LCX medical therapy 11/25/2013  . CAD (coronary artery disease), native coronary artery - LIMA-LAD, SVG-Diag, SVG-RCA 2002    Catheterization 2002 showing occluded LAD, 99% diagonal, occluded distal circumflex, 90% nondominant right coronary artery  CABG 08/06/00 by Dr. Nils Pyle with LIMA to LAD, SVG to diagonal, SVG to right coronary artery, circumflex was noted to be too small to graft    . CAP (community acquired pneumonia) 04/16/2016   See cxr 04/15/16 > treated with 7 days of Levaquin 500 mg daily with resolution radiographically and clinically.  . Cataract   . Cerumen impaction 06/05/2015  . CHF (congestive heart failure), NYHA class III ([place]) 02/20/2014  . Dyspnea    with exertion   . Dyspnea on exertion 11/08/2013   Followed as Primary Care Patient/ [facility]/ [name]  - new onset early June 2015  - 11/08/2013  Walked RA x 3 laps @ 185 ft each stopped due to  End of study, mild sob, no cp and no desat, EKG ok  - Cardiac w/u inconclusive but not clearly having ischemia  11/24/13   - 09/11/2016  Walked RA x 3 laps @ 185 ft each stopped due to  End  of study, nl pace, no sob or desat   - Spirometry 09/11/2016    . Essential hypertension    Referred to Int med [facility] 12/12/2016   . GERD (gastroesophageal reflux disease)   . H/O hiatal hernia   . History of skin cancer    bilateral arms with removal  . Hyperlipidemia   . Hyperlipidemia LDL goal <70    Followed as Primary Care Patient/ [facility]/ [name]     - Target LDL < 70 as has IHD    . Hypertension   . Hypertensive heart disease    Followed as Primary Care Patient/ [facility]/  [name]     . Ischemic heart disease   . Left femoral hernia s/p laparoscopic repair 05/28/2017 05/28/2017  . Lumbar disc disease   . Myocardial infarction ([place]) 2002  . Right inguinal hernia s/p laparoscopic repair 05/28/2017 05/28/2017  . Stage 3 chronic kidney disease ([place]) 05/22/2015   trial off lasix/ppi/clinoril / [place] and renal eval 05/22/2015 >>>    . T wave inversion in EKG 02/20/2014  . Unstable angina ([place]) 11/23/2013    Social History:  Social History   Socioeconomic History  . Marital status: Married    Spouse name: Isaac [name]  . Number of children: 0  . Years of education: Not on file  . Highest education level: 11th grade  Occupational History  . Occupation: Retired    Fish farm manager: [employer]  Tobacco Use  . Smoking status: Never Smoker  . Smokeless tobacco: Former Systems developer    Types: Secondary school teacher  . Vaping Use: Never used  Substance and Sexual Activity  . Alcohol use: No    Alcohol/week: 0.0 standard drinks  . Drug use: No  . Sexual activity: Never  Other Topics Concern  . Not on file  Social History Narrative   Lives with wife   Right Handed   Drinks 1 cup caffeine every once ina while   Social Determinants of Health   Financial Resource Strain: Not on file  Food Insecurity: Not on file  Transportation Needs: Not on file  Physical Activity: Not on file  Stress: Not on file  Social Connections: Not on file  Intimate Partner Violence: Not on file    Medications:   Current Outpatient Medications on File Prior to Visit  Medication Sig Dispense Refill  . albuterol (VENTOLIN HFA) 108 (90 Base) MCG/ACT inhaler Inhale 2 puffs into the lungs every 6 (six) hours as needed for wheezing or shortness of breath. 18 g 3  . amLODipine (NORVASC) 2.5 MG tablet Take 1 tablet (2.5 mg total) by mouth daily. 90 tablet 3  . aspirin 81 MG EC tablet Take 81 mg by mouth every morning.    . Cholecalciferol (VITAMIN D) 2000 UNITS tablet Take 2,000 Units by mouth daily.     [name] docusate sodium (COLACE) 100 MG capsule Take 1 capsule by mouth every other day    . doxazosin (CARDURA) 1 MG tablet Take 1 tablet (1 mg total) by mouth 2 (two) times daily. 180 tablet 3  . fluticasone (FLONASE) 50 MCG/ACT nasal spray Place 2 sprays into both nostrils daily as needed for allergies or rhinitis.     . Fluticasone-Salmeterol (ADVAIR DISKUS) 250-50 MCG/DOSE AEPB Inhale 1 puff into the lungs 2 (two) times daily. 60 each 11  . furosemide (LASIX) 40 MG tablet Take 1 tablet (40 mg total) by mouth daily as needed for fluid or edema. 90 tablet 3  . metoprolol  succinate (TOPROL-XL) 50 MG 24 hr tablet TAKE 1 TABLET BY MOUTH IN THE MORNING AND 1/2 TABLET IN THE EVENING 135 tablet 3  . Multiple Vitamin (MULTIVITAMIN WITH MINERALS) TABS Take 1 tablet by mouth daily.    . multivitamin-lutein (OCUVITE-LUTEIN) CAPS capsule Take 1 capsule by mouth daily.    . nitroGLYCERIN (NITROSTAT) 0.4 MG SL tablet Place 1 tablet (0.4 mg total) under the tongue every 5 (five) minutes as needed for chest pain (MAX 3 TABLETS). 10 tablet 6  . omeprazole (PRILOSEC) 20 MG capsule Take 1 capsule (20 mg total) by mouth 2 (two) times daily. 180 capsule 3  . ondansetron (ZOFRAN) 4 MG tablet Take 1 tablet (4 mg total) by mouth 3 (three) times daily as needed for nausea or vomiting. 30 tablet 1  . ondansetron (ZOFRAN-ODT) 4 MG disintegrating tablet DISSOLVE 1 TABLET IN MOUTH EVERY 8 HOURS AS NEEDED FOR NAUSEA OR VOMITING 20 tablet 0  . Pirfenidone (ESBRIET) 267 MG TABS Take 2 tablets (534 mg total) by mouth in the morning, at noon, and at bedtime. 180 tablet 5  . potassium chloride SA (KLOR-CON M20) 20 MEQ tablet TAKE ONE TABLET BY MOUTH ONCE DAILY WHEN  YOU  TAKE LASIX 30 tablet 5  . Simethicone 125 MG CAPS Take by mouth as directed. Reported on 10/10/2015 one after each meal    . simvastatin (ZOCOR) 20 MG tablet Take 1 tablet (20 mg total) by mouth at bedtime. 90 tablet 3  . vitamin C (ASCORBIC ACID) 500 MG tablet Take  500 mg by mouth daily.     No current facility-administered medications on file prior to visit.    Allergies:  No Known Allergies  Physical Exam General: well developed, well nourished, seated, in no evident distress Head: head normocephalic and atraumatic.   Neck: supple with no carotid or supraclavicular bruits Cardiovascular: regular rate and rhythm, no murmurs Musculoskeletal: no deformity Skin:  no rash/petichiae Vascular:  Normal pulses all extremities  Neurologic Exam Mental Status: Awake and fully alert. Oriented to place and time. Recent and remote memory intact. Attention span, concentration and fund of knowledge appropriate. Mood and affect appropriate.  Mini-Mental status exam score 26/30.  Diminished recall 1/3.  Able to name 11 animals that can walk on 4 legs.  Clock drawing 3/4.  Slight difficulty with copying intersecting pentagons. Cranial Nerves: Fundoscopic exam reveals sharp disc margins. Pupils equal, briskly reactive to light. Extraocular movements full without nystagmus. Visual fields full to confrontation. Hearing intact. Facial sensation intact. Face, tongue, palate moves normally and symmetrically.  Motor: Normal bulk and tone. Normal strength in all tested extremity muscles. Sensory.: intact to touch , pinprick , position and vibratory sensation.  Coordination: Rapid alternating movements normal in all extremities. Finger-to-nose and heel-to-shin performed accurately bilaterally. Gait and Station: Arises from chair without difficulty. Stance is normal. Gait demonstrates normal stride length and balance . Able to heel, toe and tandem walk without difficulty.  Reflexes: 1+ and symmetric. Toes downgoing.     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  Recommend a trial of Namenda starter pack and if tolerated then 10 mg twice daily.  I discussed possible side effects with patient and wife advised them to call me if needed.  Check EEG.  He will return for follow-up in future in 2 months or call earlier if necessary.  Greater than 50% time during this 45-minute consultation visit was spent on counseling and coordination of care about his memory loss and dementia and answering questions Antony Contras, MD  [facility] [address] [place], [place] [address]  Phone [phone] Fax [phone] Note: This document was prepared with digital dictation and possible smart phrase technology. Any transcriptional errors that result from this process are unintentional.

## 2020-07-16 NOTE — Telephone Encounter (Signed)
Return in about 2 months (around 09/15/2020).  Patient needs a follow-up appt, I didn't schedule anything because there is nothing available. I scheduled EEG for April 12.

## 2020-07-16 NOTE — Patient Instructions (Addendum)
I had a long discussion with the patient and his wife regarding his progressive memory and cognitive worsening which 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  Recommend a trial of Namenda starter pack and if tolerated then 10 mg twice daily.  I discussed possible side effects with patient and wife advised them to call me if needed.  Check EEG.  He will return for follow-up in future in 2 months or call earlier if necessary.   Alzheimer's Disease Caregiver Guide Alzheimer's disease is a condition that makes a person:  Forget things.  Act differently.  Have trouble paying attention and doing simple tasks. These things get worse with time. The tips below can help you care for the person. How to help manage lifestyle changes Tips to help with symptoms  Be calm and patient.  Give simple, short answers to questions.  Avoid correcting the person in a negative way.  Try not to take things personally, even if the person forgets your name.  Do not argue with the person. This may make the person more upset. Tips to lessen frustration  Make appointments and do daily tasks when the person is at his or her best.  Take your time. Simple tasks may take longer. Allow plenty of time to complete tasks.  Limit choices for the person.  Involve the person in what you are doing.  Keep things organized: ? Keep a daily routine. ? Organize medicines in a pillbox for each day of the week. ? Keep a calendar in a central location to remind the person of meetings or other activities.  Avoid new or crowded places, if possible.  Use simple words, short sentences, and a calm voice. Only give one direction at a time.  Buy clothes and shoes that are easy to put on and take off.  Try to change the subject if the person becomes frustrated or angry. Tips to prevent injury  Keep floors clear. Remove rugs, magazine racks, and floor  lamps.  Keep hallways well-lit.  Put a handrail and non-slip mat in the bathtub or shower.  Put childproof locks on cabinets that have dangerous items in them. These items include medicine, alcohol, guns, toxic cleaning items, sharp tools, matches, and lighters.  Put locks on doors where the person cannot see or reach them. This helps keep the person from going out of the house and getting lost.  Be ready for emergencies. Keep a list of emergency phone numbers and addresses close by.  Remove car keys and lock garage doors so that the person does not try to drive.  Bracelets may be worn that track location and identify the person as having memory problems. This should be worn at all times for safety.   Tips for the future  Discuss financial and legal planning early. People with this disease have trouble managing their money as the disease gets worse. Get help from a professional.  Talk about advance directives, safety, and daily care. Take these steps: ? Create a living will and choose a power of attorney. This is someone who can make decisions for the person with Alzheimer's disease when he or she can no longer do so. ? Discuss driving safety and when to stop driving. The person's doctor can help with this. ? If the person lives alone, make sure he or she is safe. Some people need extra help at home. Other people need more care at a nursing home or care center.  How to recognize changes in the person's condition With this disease, memory problems and confusion slowly get worse. In time, the person may not know his or her friends and family members. The disease can also cause changes in behavior and mood, such as anxiety or anger. The person may see, hear, taste, smell, or feel things that are not real (hallucinate). These changes can come on all of a sudden. They may happen in response to something such as:  Pain.  An infection.  Changes in temperature or noise.  Too much  stimulation.  Feeling lost or scared.  Medicines. Where to find support  Find out about services that can provide short-term care (respite care). These can allow you to take a break when you need it.  Join a support group near you. These groups can help you: ? Learn ways to manage stress. ? Share experiences with others. ? Get emotional comfort and support. ? Learn about caregiving as the disease gets worse. ? Know what community resources are available. Where to find more information  Alzheimer's Association: [employer] Contact a doctor if:  The person has a fever.  The person has a sudden behavior change that does not get better with calming strategies.  The person is not able to take care of himself or herself at home.  You are no longer able to care for the person. Get help right away if:  The person has a sudden increase in confusion or new hallucinations.  The person threatens you or anyone else, including himself or herself. Get help right away if you feel like your loved one may hurt himself or herself or others, or has thoughts about taking his or her own life. Go to your nearest emergency room or:  Call your local emergency services (911 in the [place]  Call the [place] at 2021880247. This is open 24 hours a day.  Text the Crisis Text Line at 816 625 4111. Summary  Alzheimer's disease causes a person to forget things.  A person who has this condition may have trouble doing simple tasks.  Take steps to keep the person from getting hurt. Plan for future care.  You can find support by joining a support group near you. This information is not intended to replace advice given to you by your health care provider. Make sure you discuss any questions you have with your health care provider. Document Revised: 08/01/2019 Document Reviewed: 08/01/2019 Elsevier Patient Education  2021 [employer].

## 2020-07-17 NOTE — Telephone Encounter (Signed)
Blocked appt has been accepted. Pt has been scheduled.

## 2020-07-17 NOTE — Telephone Encounter (Signed)
Isaac [name]- can you try calling pt again and offer appt Erika blocked? Thank you

## 2020-07-26 ENCOUNTER — Other Ambulatory Visit (HOSPITAL_COMMUNITY): Payer: Self-pay

## 2020-08-07 ENCOUNTER — Other Ambulatory Visit: Payer: Medicare Other

## 2020-08-08 ENCOUNTER — Other Ambulatory Visit (HOSPITAL_COMMUNITY): Payer: Self-pay

## 2020-08-09 ENCOUNTER — Other Ambulatory Visit (HOSPITAL_COMMUNITY): Payer: Self-pay

## 2020-08-09 MED FILL — Pirfenidone Tab 267 MG: ORAL | 30 days supply | Qty: 180 | Fill #0 | Status: AC

## 2020-08-10 ENCOUNTER — Ambulatory Visit: Payer: Medicare Other | Admitting: Family Medicine

## 2020-08-14 ENCOUNTER — Other Ambulatory Visit (HOSPITAL_COMMUNITY): Payer: Self-pay

## 2020-08-23 DIAGNOSIS — N1832 Chronic kidney disease, stage 3b: Secondary | ICD-10-CM | POA: Diagnosis not present

## 2020-08-23 DIAGNOSIS — I129 Hypertensive chronic kidney disease with stage 1 through stage 4 chronic kidney disease, or unspecified chronic kidney disease: Secondary | ICD-10-CM | POA: Diagnosis not present

## 2020-08-23 DIAGNOSIS — I5032 Chronic diastolic (congestive) heart failure: Secondary | ICD-10-CM | POA: Diagnosis not present

## 2020-08-23 DIAGNOSIS — R768 Other specified abnormal immunological findings in serum: Secondary | ICD-10-CM | POA: Diagnosis not present

## 2020-08-23 DIAGNOSIS — R7303 Prediabetes: Secondary | ICD-10-CM | POA: Diagnosis not present

## 2020-08-29 ENCOUNTER — Ambulatory Visit (INDEPENDENT_AMBULATORY_CARE_PROVIDER_SITE_OTHER): Payer: Medicare Other | Admitting: Neurology

## 2020-08-29 ENCOUNTER — Other Ambulatory Visit: Payer: Self-pay | Admitting: Pulmonary Disease

## 2020-08-29 ENCOUNTER — Other Ambulatory Visit: Payer: Self-pay

## 2020-08-29 DIAGNOSIS — R410 Disorientation, unspecified: Secondary | ICD-10-CM

## 2020-08-29 DIAGNOSIS — R413 Other amnesia: Secondary | ICD-10-CM

## 2020-08-30 DIAGNOSIS — N1832 Chronic kidney disease, stage 3b: Secondary | ICD-10-CM | POA: Diagnosis not present

## 2020-08-30 DIAGNOSIS — D638 Anemia in other chronic diseases classified elsewhere: Secondary | ICD-10-CM | POA: Diagnosis not present

## 2020-08-30 DIAGNOSIS — I5032 Chronic diastolic (congestive) heart failure: Secondary | ICD-10-CM | POA: Diagnosis not present

## 2020-08-30 DIAGNOSIS — I129 Hypertensive chronic kidney disease with stage 1 through stage 4 chronic kidney disease, or unspecified chronic kidney disease: Secondary | ICD-10-CM | POA: Diagnosis not present

## 2020-08-31 NOTE — Progress Notes (Signed)
Kindly inform the patient that EEG study showed slowing of brain wave activity which may occur in patients with memory loss.  No definite seizure activity was noted

## 2020-09-04 ENCOUNTER — Telehealth: Payer: Self-pay | Admitting: Emergency Medicine

## 2020-09-04 NOTE — Telephone Encounter (Signed)
LVM for return call. 

## 2020-09-04 NOTE — Telephone Encounter (Signed)
-----   Message from Garvin Fila, MD sent at 08/31/2020  9:39 AM EDT ----- Mitchell Heir inform the patient that EEG study showed slowing of brain wave activity which may occur in patients with memory loss.  No definite seizure activity was noted

## 2020-09-05 NOTE — Telephone Encounter (Signed)
LVM on mobile phone for return call. NA and no VM on home phone.

## 2020-09-06 NOTE — Telephone Encounter (Signed)
Reached patient's wife by mobile phone.  Discussed Dr. Clydene Fake review and findings regarding patient's EEG study.    Patient denied further questions, verbalized understanding and expressed appreciation for the phone call.

## 2020-09-10 ENCOUNTER — Other Ambulatory Visit (HOSPITAL_COMMUNITY): Payer: Self-pay

## 2020-09-10 ENCOUNTER — Other Ambulatory Visit: Payer: Self-pay | Admitting: Pulmonary Disease

## 2020-09-10 DIAGNOSIS — J849 Interstitial pulmonary disease, unspecified: Secondary | ICD-10-CM

## 2020-09-10 MED ORDER — ESBRIET 267 MG PO TABS
ORAL_TABLET | ORAL | 5 refills | Status: AC
  Filled 2020-09-10: qty 180, 30d supply, fill #0
  Filled 2020-10-18: qty 180, 30d supply, fill #1
  Filled 2020-11-14 – 2020-12-27 (×2): qty 180, 30d supply, fill #2

## 2020-09-13 ENCOUNTER — Ambulatory Visit (INDEPENDENT_AMBULATORY_CARE_PROVIDER_SITE_OTHER): Payer: Medicare Other | Admitting: Family Medicine

## 2020-09-13 ENCOUNTER — Encounter: Payer: Self-pay | Admitting: Family Medicine

## 2020-09-13 ENCOUNTER — Other Ambulatory Visit: Payer: Self-pay

## 2020-09-13 VITALS — BP 102/59 | HR 67 | Ht 70.5 in | Wt 155.0 lb

## 2020-09-13 DIAGNOSIS — R42 Dizziness and giddiness: Secondary | ICD-10-CM

## 2020-09-13 DIAGNOSIS — R5383 Other fatigue: Secondary | ICD-10-CM | POA: Diagnosis not present

## 2020-09-13 NOTE — Progress Notes (Signed)
BP (!) 102/59   Pulse 67   Ht 5' 10.5" (1.791 m)   Wt 155 lb (70.3 kg)   SpO2 98%   BMI 21.93 kg/m    Subjective:   Patient ID: Isaac [name], male    DOB: July 06, 1940, 80 y.o.   MRN: [id number]  HPI: Isaac [name] is a 80 y.o. male presenting on 09/13/2020 for Fatigue, Shortness of Breath, and Dizziness   HPI Dizziness and fatigue and shortness of breath on exertion. Patient is here with his daughter today and she says when he stands up now he will lose all of his energy and feel, shortness of breath and dizziness and lightheadedness and have to sit back down.  She says is been going on since February over the past 3 months but it is really been worsening over the past couple weeks.  He denies any chest pain or palpitations or flutters or shortness of breath just sitting here but this is only on exertion.  He says it may have started since he started a medicine for his pulmonary fibrosis but has been worsening even more.  Relevant past medical, surgical, family and social history reviewed and updated as indicated. Interim medical history since our last visit reviewed. Allergies and medications reviewed and updated.  Review of Systems  Constitutional: Positive for fatigue. Negative for chills and fever.  Respiratory: Positive for shortness of breath. Negative for wheezing.   Cardiovascular: Negative for chest pain and leg swelling.  Musculoskeletal: Negative for back pain and gait problem.  Skin: Negative for rash.  Neurological: Positive for dizziness and weakness.  All other systems reviewed and are negative.   Per HPI unless specifically indicated above   Allergies as of 09/13/2020   No Known Allergies     Medication List       Accurate as of Sep 13, 2020  2:34 PM. If you have any questions, ask your nurse or doctor.        STOP taking these medications   amLODipine 2.5 MG tablet Commonly known as: NORVASC Stopped by: Fransisca Kaufmann Cashel Bellina, MD   doxazosin 1 MG  tablet Commonly known as: Cardura Stopped by: Worthy Rancher, MD     TAKE these medications   albuterol 108 (90 Base) MCG/ACT inhaler Commonly known as: VENTOLIN HFA Inhale 2 puffs into the lungs every 6 (six) hours as needed for wheezing or shortness of breath.   aspirin 81 MG EC tablet Take 81 mg by mouth every morning.   docusate sodium 100 MG capsule Commonly known as: COLACE Take 1 capsule by mouth every other day   Esbriet 267 MG Tabs Generic drug: Pirfenidone TAKE 2 TABLETS (534 MG TOTAL) BY MOUTH IN THE MORNING, AT NOON, AND AT BEDTIME.   fluticasone 50 MCG/ACT nasal spray Commonly known as: FLONASE Place 2 sprays into both nostrils daily as needed for allergies or rhinitis.   Fluticasone-Salmeterol 250-50 MCG/DOSE Aepb Commonly known as: Advair Diskus Inhale 1 puff into the lungs 2 (two) times daily.   furosemide 40 MG tablet Commonly known as: LASIX Take 1 tablet (40 mg total) by mouth daily as needed for fluid or edema.   memantine tablet pack Commonly known as: Namenda Titration Pak 5 mg/day for =1 week; 5 mg twice daily for =1 week; 15 mg/day given in 5 mg and 10 mg separated doses for =1 week; then 10 mg twice daily   memantine 10 MG tablet Commonly known as: Namenda Take 1 tablet (10  mg total) by mouth 2 (two) times daily. Start after finishing the namenda starter pack first   metoprolol succinate 50 MG 24 hr tablet Commonly known as: TOPROL-XL TAKE 1 TABLET BY MOUTH IN THE MORNING AND 1/2 TABLET IN THE EVENING   multivitamin with minerals Tabs tablet Take 1 tablet by mouth daily.   multivitamin-lutein Caps capsule Take 1 capsule by mouth daily.   nitroGLYCERIN 0.4 MG SL tablet Commonly known as: NITROSTAT Place 1 tablet (0.4 mg total) under the tongue every 5 (five) minutes as needed for chest pain (MAX 3 TABLETS).   omeprazole 20 MG capsule Commonly known as: PRILOSEC Take 1 capsule (20 mg total) by mouth 2 (two) times daily.    ondansetron 4 MG disintegrating tablet Commonly known as: ZOFRAN-ODT DISSOLVE 1 TABLET IN MOUTH EVERY 8 HOURS AS NEEDED FOR NAUSEA OR VOMITING   ondansetron 4 MG tablet Commonly known as: ZOFRAN TAKE 1 TABLET BY MOUTH THREE TIMES DAILY AS NEEDED FOR NAUSEA AND VOMITING   potassium chloride SA 20 MEQ tablet Commonly known as: Klor-Con M20 TAKE ONE TABLET BY MOUTH ONCE DAILY WHEN  YOU  TAKE LASIX   Simethicone 125 MG Caps Take by mouth as directed. Reported on 10/10/2015 one after each meal   simvastatin 20 MG tablet Commonly known as: ZOCOR Take 1 tablet (20 mg total) by mouth at bedtime.   vitamin C 500 MG tablet Commonly known as: ASCORBIC ACID Take 500 mg by mouth daily.   Vitamin D 50 MCG (2000 UT) tablet Take 2,000 Units by mouth daily.        Objective:   BP (!) 102/59   Pulse 67   Ht 5' 10.5" (1.791 m)   Wt 155 lb (70.3 kg)   SpO2 98%   BMI 21.93 kg/m   Wt Readings from Last 3 Encounters:  09/13/20 155 lb (70.3 kg)  07/16/20 157 lb (71.2 kg)  07/06/20 158 lb (71.7 kg)    Physical Exam Vitals and nursing note reviewed.  Constitutional:      General: He is not in acute distress.    Appearance: He is well-developed. He is not diaphoretic.  Eyes:     General: No scleral icterus.    Conjunctiva/sclera: Conjunctivae normal.  Neck:     Thyroid: No thyromegaly.  Cardiovascular:     Rate and Rhythm: Normal rate and regular rhythm.     Heart sounds: Normal heart sounds. No murmur heard.   Pulmonary:     Effort: Pulmonary effort is normal. No respiratory distress.     Breath sounds: Normal breath sounds. No wheezing.  Musculoskeletal:        General: No swelling. Normal range of motion.     Cervical back: Neck supple.  Lymphadenopathy:     Cervical: No cervical adenopathy.  Skin:    General: Skin is warm and dry.     Findings: No rash.  Neurological:     Mental Status: He is alert and oriented to person, place, and time.     Coordination:  Coordination normal.  Psychiatric:        Behavior: Behavior normal.       Assessment & Plan:   Problem List Items Addressed This Visit   None   Visit Diagnoses    Orthostatic dizziness    -  Primary   Relevant Orders   CBC with Differential/Platelet   CMP14+EGFR   TSH   Brain natriuretic peptide   Fatigue, unspecified type  Relevant Orders   CBC with Differential/Platelet   CMP14+EGFR   TSH   Brain natriuretic peptide      Hypertension is significant, will back off on his amlodipine and his doxazosin Follow up plan: Return if symptoms worsen or fail to improve, for 1 to 2-week follow-up for dizziness and fatigue and blood pressure.  Counseling provided for all of the vaccine components Orders Placed This Encounter  Procedures  . CBC with Differential/Platelet  . CMP14+EGFR  . TSH  . Brain natriuretic peptide    Caryl Pina, MD [facility] 09/13/2020, 2:34 PM

## 2020-09-14 LAB — CMP14+EGFR
ALT: 7 IU/L (ref 0–44)
AST: 10 IU/L (ref 0–40)
Albumin/Globulin Ratio: 1.4 (ref 1.2–2.2)
Albumin: 4.1 g/dL (ref 3.7–4.7)
Alkaline Phosphatase: 79 IU/L (ref 44–121)
BUN/Creatinine Ratio: 16 (ref 10–24)
BUN: 34 mg/dL — ABNORMAL HIGH (ref 8–27)
Bilirubin Total: 0.3 mg/dL (ref 0.0–1.2)
CO2: 22 mmol/L (ref 20–29)
Calcium: 9 mg/dL (ref 8.6–10.2)
Chloride: 102 mmol/L (ref 96–106)
Creatinine, Ser: 2.16 mg/dL — ABNORMAL HIGH (ref 0.76–1.27)
Globulin, Total: 2.9 g/dL (ref 1.5–4.5)
Glucose: 91 mg/dL (ref 65–99)
Potassium: 5.2 mmol/L (ref 3.5–5.2)
Sodium: 139 mmol/L (ref 134–144)
Total Protein: 7 g/dL (ref 6.0–8.5)
eGFR: 30 mL/min/{1.73_m2} — ABNORMAL LOW (ref >59)

## 2020-09-14 LAB — CBC WITH DIFFERENTIAL/PLATELET
Basophils Absolute: 0.1 10*3/uL (ref 0.0–0.2)
Basos: 1 %
EOS (ABSOLUTE): 0.2 10*3/uL (ref 0.0–0.4)
Eos: 3 %
Hematocrit: 37.9 % (ref 37.5–51.0)
Hemoglobin: 13 g/dL (ref 13.0–17.7)
Immature Grans (Abs): 0 10*3/uL (ref 0.0–0.1)
Immature Granulocytes: 0 %
Lymphocytes Absolute: 2.5 10*3/uL (ref 0.7–3.1)
Lymphs: 33 %
MCH: 31.8 pg (ref 26.6–33.0)
MCHC: 34.3 g/dL (ref 31.5–35.7)
MCV: 93 fL (ref 79–97)
Monocytes Absolute: 0.7 10*3/uL (ref 0.1–0.9)
Monocytes: 9 %
Neutrophils Absolute: 4.2 10*3/uL (ref 1.4–7.0)
Neutrophils: 54 %
Platelets: 155 10*3/uL (ref 150–450)
RBC: 4.09 x10E6/uL — ABNORMAL LOW (ref 4.14–5.80)
RDW: 14.4 % (ref 11.6–15.4)
WBC: 7.8 10*3/uL (ref 3.4–10.8)

## 2020-09-14 LAB — BRAIN NATRIURETIC PEPTIDE: BNP: 161.6 pg/mL — ABNORMAL HIGH (ref 0.0–100.0)

## 2020-09-14 LAB — TSH: TSH: 2.62 u[IU]/mL (ref 0.450–4.500)

## 2020-09-15 ENCOUNTER — Other Ambulatory Visit (HOSPITAL_COMMUNITY): Payer: Self-pay

## 2020-09-17 ENCOUNTER — Other Ambulatory Visit (HOSPITAL_COMMUNITY): Payer: Self-pay

## 2020-09-20 ENCOUNTER — Encounter: Payer: Self-pay | Admitting: Family Medicine

## 2020-09-20 ENCOUNTER — Ambulatory Visit (INDEPENDENT_AMBULATORY_CARE_PROVIDER_SITE_OTHER): Payer: Medicare Other | Admitting: Family Medicine

## 2020-09-20 ENCOUNTER — Other Ambulatory Visit: Payer: Self-pay

## 2020-09-20 VITALS — BP 136/70 | HR 59 | Temp 97.1°F | Resp 20 | Ht 70.0 in | Wt 154.0 lb

## 2020-09-20 DIAGNOSIS — N1832 Chronic kidney disease, stage 3b: Secondary | ICD-10-CM | POA: Diagnosis not present

## 2020-09-20 DIAGNOSIS — I10 Essential (primary) hypertension: Secondary | ICD-10-CM

## 2020-09-20 NOTE — Progress Notes (Signed)
BP 136/70   Pulse (!) 59   Temp (!) 97.1 F (36.2 C) (Temporal)   Resp 20   Ht _0  (1.778 m)   Wt 154 lb (69.9 kg)   BMI 22.10 kg/m    Subjective:   Patient ID: Isaac [name], male    DOB: 23-Oct-1940, 80 y.o.   MRN: [id number]  HPI: Isaac [name] is a 80 y.o. male presenting on 09/20/2020 for Recheck BP   HPI Hypertension Patient is currently on furosemide and metoprolol, and their blood pressure today is 136/70, we cut back and his blood pressure is up more.. Patient denies any lightheadedness or dizziness. Patient denies headaches, blurred vision, chest pains, shortness of breath, or weakness. Denies any side effects from medication and is content with current medication.  He is feeling a lot better.  She says his biggest complaint right now is that his appetite is down would recommend encouraging Ensure or boost or protein shakes  Relevant past medical, surgical, family and social history reviewed and updated as indicated. Interim medical history since our last visit reviewed. Allergies and medications reviewed and updated.  Review of Systems  Constitutional: Negative for chills and fever.  Respiratory: Negative for shortness of breath and wheezing.   Cardiovascular: Negative for chest pain and leg swelling.  Musculoskeletal: Negative for back pain and gait problem.  Skin: Negative for rash.  Neurological: Negative for dizziness, weakness and light-headedness.  All other systems reviewed and are negative.   Per HPI unless specifically indicated above   Allergies as of 09/20/2020   No Known Allergies     Medication List       Accurate as of Sep 20, 2020  4:51 PM. If you have any questions, ask your nurse or doctor.        albuterol 108 (90 Base) MCG/ACT inhaler Commonly known as: VENTOLIN HFA Inhale 2 puffs into the lungs every 6 (six) hours as needed for wheezing or shortness of breath.   aspirin 81 MG EC tablet Take 81 mg by mouth every morning.    docusate sodium 100 MG capsule Commonly known as: COLACE Take 1 capsule by mouth every other day   Esbriet 267 MG Tabs Generic drug: Pirfenidone TAKE 2 TABLETS (534 MG TOTAL) BY MOUTH IN THE MORNING, AT NOON, AND AT BEDTIME.   fluticasone 50 MCG/ACT nasal spray Commonly known as: FLONASE Place 2 sprays into both nostrils daily as needed for allergies or rhinitis.   Fluticasone-Salmeterol 250-50 MCG/DOSE Aepb Commonly known as: Advair Diskus Inhale 1 puff into the lungs 2 (two) times daily.   furosemide 40 MG tablet Commonly known as: LASIX Take 1 tablet (40 mg total) by mouth daily as needed for fluid or edema.   memantine tablet pack Commonly known as: Namenda Titration Pak 5 mg/day for =1 week; 5 mg twice daily for =1 week; 15 mg/day given in 5 mg and 10 mg separated doses for =1 week; then 10 mg twice daily   memantine 10 MG tablet Commonly known as: Namenda Take 1 tablet (10 mg total) by mouth 2 (two) times daily. Start after finishing the namenda starter pack first   metoprolol succinate 50 MG 24 hr tablet Commonly known as: TOPROL-XL TAKE 1 TABLET BY MOUTH IN THE MORNING AND 1/2 TABLET IN THE EVENING   multivitamin with minerals Tabs tablet Take 1 tablet by mouth daily.   multivitamin-lutein Caps capsule Take 1 capsule by mouth daily.   nitroGLYCERIN 0.4 MG SL tablet  Commonly known as: NITROSTAT Place 1 tablet (0.4 mg total) under the tongue every 5 (five) minutes as needed for chest pain (MAX 3 TABLETS).   omeprazole 20 MG capsule Commonly known as: PRILOSEC Take 1 capsule (20 mg total) by mouth 2 (two) times daily.   ondansetron 4 MG disintegrating tablet Commonly known as: ZOFRAN-ODT DISSOLVE 1 TABLET IN MOUTH EVERY 8 HOURS AS NEEDED FOR NAUSEA OR VOMITING   ondansetron 4 MG tablet Commonly known as: ZOFRAN TAKE 1 TABLET BY MOUTH THREE TIMES DAILY AS NEEDED FOR NAUSEA AND VOMITING   potassium chloride SA 20 MEQ tablet Commonly known as: Klor-Con  M20 TAKE ONE TABLET BY MOUTH ONCE DAILY WHEN  YOU  TAKE LASIX   Simethicone 125 MG Caps Take by mouth as directed. Reported on 10/10/2015 one after each meal   simvastatin 20 MG tablet Commonly known as: ZOCOR Take 1 tablet (20 mg total) by mouth at bedtime.   vitamin C 500 MG tablet Commonly known as: ASCORBIC ACID Take 500 mg by mouth daily.   Vitamin D 50 MCG (2000 UT) tablet Take 2,000 Units by mouth daily.        Objective:   BP 136/70   Pulse (!) 59   Temp (!) 97.1 F (36.2 C) (Temporal)   Resp 20   Ht _0  (1.778 m)   Wt 154 lb (69.9 kg)   BMI 22.10 kg/m   Wt Readings from Last 3 Encounters:  09/20/20 154 lb (69.9 kg)  09/13/20 155 lb (70.3 kg)  07/16/20 157 lb (71.2 kg)    Physical Exam Vitals and nursing note reviewed.  Constitutional:      General: He is not in acute distress.    Appearance: He is well-developed. He is not diaphoretic.  Eyes:     General: No scleral icterus.    Conjunctiva/sclera: Conjunctivae normal.  Neck:     Thyroid: No thyromegaly.  Cardiovascular:     Rate and Rhythm: Normal rate and regular rhythm.     Heart sounds: Normal heart sounds. No murmur heard.   Pulmonary:     Effort: Pulmonary effort is normal. No respiratory distress.     Breath sounds: Normal breath sounds. No wheezing.  Musculoskeletal:        General: Normal range of motion.     Cervical back: Neck supple.  Lymphadenopathy:     Cervical: No cervical adenopathy.  Skin:    General: Skin is warm and dry.     Findings: No rash.  Neurological:     Mental Status: He is alert and oriented to person, place, and time.     Coordination: Coordination normal.  Psychiatric:        Behavior: Behavior normal.       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  We will recheck a monitor kidneys, increase protein shakes and hydration. Follow up plan: Return if symptoms worsen or fail to improve.  Counseling provided for all of the vaccine components Orders Placed This Encounter  Procedures  . BMP8+EGFR    Caryl Pina, MD [facility] 09/20/2020, 4:51 PM

## 2020-09-21 LAB — BMP8+EGFR
BUN/Creatinine Ratio: 15 (ref 10–24)
BUN: 26 mg/dL (ref 8–27)
CO2: 22 mmol/L (ref 20–29)
Calcium: 9.3 mg/dL (ref 8.6–10.2)
Chloride: 105 mmol/L (ref 96–106)
Creatinine, Ser: 1.75 mg/dL — ABNORMAL HIGH (ref 0.76–1.27)
Glucose: 98 mg/dL (ref 65–99)
Potassium: 4.4 mmol/L (ref 3.5–5.2)
Sodium: 141 mmol/L (ref 134–144)
eGFR: 39 mL/min/{1.73_m2} — ABNORMAL LOW (ref >59)

## 2020-09-25 ENCOUNTER — Ambulatory Visit: Payer: Medicare Other | Admitting: Neurology

## 2020-10-03 ENCOUNTER — Ambulatory Visit: Payer: BC Managed Care – PPO | Admitting: Family Medicine

## 2020-10-04 ENCOUNTER — Ambulatory Visit (INDEPENDENT_AMBULATORY_CARE_PROVIDER_SITE_OTHER): Payer: Medicare Other

## 2020-10-04 VITALS — Ht 70.0 in | Wt 153.0 lb

## 2020-10-04 DIAGNOSIS — Z0000 Encounter for general adult medical examination without abnormal findings: Secondary | ICD-10-CM

## 2020-10-04 NOTE — Patient Instructions (Signed)
Isaac [name] , Thank you for taking time to come for your Medicare Wellness Visit. I appreciate your ongoing commitment to your health goals. Please review the following plan we discussed and let me know if I can assist you in the future.   Screening recommendations/referrals: Colonoscopy: Done 11/25/2011 - No longer required Recommended yearly ophthalmology/optometry visit for glaucoma screening and checkup Recommended yearly dental visit for hygiene and checkup  Vaccinations: Influenza vaccine: Done 01/19/2020 - Repeat annually Pneumococcal vaccine: Done 03/28/2014 & 01/02/2017 Tdap vaccine: Done 02/18/2020 - Repeat in 10 years Shingles vaccine: Due   Covid-19: Done 08/23/2019, 09/20/2019, & 04/11/2020  Advanced directives: Please bring a copy of your health care power of attorney and living will to the office to be added to your chart at your convenience.  Conditions/risks identified: Aim for 30 minutes of exercise or brisk walking each day, drink 6-8 glasses of water and eat lots of fruits and vegetables.  Next appointment: Follow up in one year for your annual wellness visit.   Preventive Care 80 Years and Older, Male  Preventive care refers to lifestyle choices and visits with your health care provider that can promote health and wellness. What does preventive care include? A yearly physical exam. This is also called an annual well check. Dental exams once or twice a year. Routine eye exams. Ask your health care provider how often you should have your eyes checked. Personal lifestyle choices, including: Daily care of your teeth and gums. Regular physical activity. Eating a healthy diet. Avoiding tobacco and drug use. Limiting alcohol use. Practicing safe sex. Taking low doses of aspirin every day. Taking vitamin and mineral supplements as recommended by your health care provider. What happens during an annual well check? The services and screenings done by your health care provider  during your annual well check will depend on your age, overall health, lifestyle risk factors, and family history of disease. Counseling  Your health care provider may ask you questions about your: Alcohol use. Tobacco use. Drug use. Emotional well-being. Home and relationship well-being. Sexual activity. Eating habits. History of falls. Memory and ability to understand (cognition). Work and work Statistician. Screening  You may have the following tests or measurements: Height, weight, and BMI. Blood pressure. Lipid and cholesterol levels. These may be checked every 5 years, or more frequently if you are over 22 years old. Skin check. Lung cancer screening. You may have this screening every year starting at age 80 if you have a 30-pack-year history of smoking and currently smoke or have quit within the past 15 years. Fecal occult blood test (FOBT) of the stool. You may have this test every year starting at age 80. Flexible sigmoidoscopy or colonoscopy. You may have a sigmoidoscopy every 5 years or a colonoscopy every 10 years starting at age 80. Prostate cancer screening. Recommendations will vary depending on your family history and other risks. Hepatitis C blood test. Hepatitis B blood test. Sexually transmitted disease (STD) testing. Diabetes screening. This is done by checking your blood sugar (glucose) after you have not eaten for a while (fasting). You may have this done every 1-3 years. Abdominal aortic aneurysm (AAA) screening. You may need this if you are a current or former smoker. Osteoporosis. You may be screened starting at age 80 if you are at high risk. Talk with your health care provider about your test results, treatment options, and if necessary, the need for more tests. Vaccines  Your health care provider may recommend certain vaccines,  such as: Influenza vaccine. This is recommended every year. Tetanus, diphtheria, and acellular pertussis (Tdap, Td) vaccine. You  may need a Td booster every 10 years. Zoster vaccine. You may need this after age 80. Pneumococcal 13-valent conjugate (PCV13) vaccine. One dose is recommended after age 80. Pneumococcal polysaccharide (PPSV23) vaccine. One dose is recommended after age 80. Talk to your health care provider about which screenings and vaccines you need and how often you need them. This information is not intended to replace advice given to you by your health care provider. Make sure you discuss any questions you have with your health care provider. Document Released: 05/11/2015 Document Revised: 01/02/2016 Document Reviewed: 02/13/2015 Elsevier Interactive Patient Education  2017 [place] Prevention in the Home Falls can cause injuries. They can happen to people of all ages. There are many things you can do to make your home safe and to help prevent falls. What can I do on the outside of my home? Regularly fix the edges of walkways and driveways and fix any cracks. Remove anything that might make you trip as you walk through a door, such as a raised step or threshold. Trim any bushes or trees on the path to your home. Use bright outdoor lighting. Clear any walking paths of anything that might make someone trip, such as rocks or tools. Regularly check to see if handrails are loose or broken. Make sure that both sides of any steps have handrails. Any raised decks and porches should have guardrails on the edges. Have any leaves, snow, or ice cleared regularly. Use sand or salt on walking paths during winter. Clean up any spills in your garage right away. This includes oil or grease spills. What can I do in the bathroom? Use night lights. Install grab bars by the toilet and in the tub and shower. Do not use towel bars as grab bars. Use non-skid mats or decals in the tub or shower. If you need to sit down in the shower, use a plastic, non-slip stool. Keep the floor dry. Clean up any water that spills  on the floor as soon as it happens. Remove soap buildup in the tub or shower regularly. Attach bath mats securely with double-sided non-slip rug tape. Do not have throw rugs and other things on the floor that can make you trip. What can I do in the bedroom? Use night lights. Make sure that you have a light by your bed that is easy to reach. Do not use any sheets or blankets that are too big for your bed. They should not hang down onto the floor. Have a firm chair that has side arms. You can use this for support while you get dressed. Do not have throw rugs and other things on the floor that can make you trip. What can I do in the kitchen? Clean up any spills right away. Avoid walking on wet floors. Keep items that you use a lot in easy-to-reach places. If you need to reach something above you, use a strong step stool that has a grab bar. Keep electrical cords out of the way. Do not use floor polish or wax that makes floors slippery. If you must use wax, use non-skid floor wax. Do not have throw rugs and other things on the floor that can make you trip. What can I do with my stairs? Do not leave any items on the stairs. Make sure that there are handrails on both sides of the stairs and  use them. Fix handrails that are broken or loose. Make sure that handrails are as long as the stairways. Check any carpeting to make sure that it is firmly attached to the stairs. Fix any carpet that is loose or worn. Avoid having throw rugs at the top or bottom of the stairs. If you do have throw rugs, attach them to the floor with carpet tape. Make sure that you have a light switch at the top of the stairs and the bottom of the stairs. If you do not have them, ask someone to add them for you. What else can I do to help prevent falls? Wear shoes that: Do not have high heels. Have rubber bottoms. Are comfortable and fit you well. Are closed at the toe. Do not wear sandals. If you use a stepladder: Make  sure that it is fully opened. Do not climb a closed stepladder. Make sure that both sides of the stepladder are locked into place. Ask someone to hold it for you, if possible. Clearly mark and make sure that you can see: Any grab bars or handrails. First and last steps. Where the edge of each step is. Use tools that help you move around (mobility aids) if they are needed. These include: Canes. Walkers. Scooters. Crutches. Turn on the lights when you go into a dark area. Replace any light bulbs as soon as they burn out. Set up your furniture so you have a clear path. Avoid moving your furniture around. If any of your floors are uneven, fix them. If there are any pets around you, be aware of where they are. Review your medicines with your doctor. Some medicines can make you feel dizzy. This can increase your chance of falling. Ask your doctor what other things that you can do to help prevent falls. This information is not intended to replace advice given to you by your health care provider. Make sure you discuss any questions you have with your health care provider. Document Released: 02/08/2009 Document Revised: 09/20/2015 Document Reviewed: 05/19/2014 Elsevier Interactive Patient Education  2017 [employer].

## 2020-10-04 NOTE — Progress Notes (Signed)
Subjective:   Isaac [name] is a 80 y.o. male who presents for Medicare Annual/Subsequent preventive examination.  Virtual Visit via Telephone Note  I connected with  [name] on 10/04/20 at  4:15 PM EDT by telephone and verified that I am speaking with the correct person using two identifiers.  Location: Patient: Home Provider: [facility] Persons participating in the virtual visit: patient, his wife/Nurse Health Advisor   I discussed the limitations, risks, security and privacy concerns of performing an evaluation and management service by telephone and the availability of in person appointments. The patient expressed understanding and agreed to proceed.  Interactive audio and video telecommunications were attempted between this nurse and patient, however failed, due to patient having technical difficulties OR patient did not have access to video capability.  We continued and completed visit with audio only.  Some vital signs may be absent or patient reported.   Keilin Gamboa E Orvill Coulthard, LPN   Review of Systems     Cardiac Risk Factors include: advanced age (>40men, >61 women);sedentary lifestyle;dyslipidemia;hypertension;male gender     Objective:    Today's Vitals   10/04/20 1627  Weight: 153 lb (69.4 kg)  Height: 5\' 10"  (1.778 m)   Body mass index is 21.95 kg/m.  Advanced Directives 10/04/2020 02/18/2020 05/20/2019 07/28/2018 05/28/2017 05/19/2017 04/26/2017  Does Patient Have a Medical Advance Directive? Yes No No Yes Yes Yes Yes  Type of Paramedic of [place];Living will - - Healthcare Power of Attorney Living will;Healthcare Power of [place];Living will Living will  Does patient want to make changes to medical advance directive? - - - - - No - Patient declined -  Copy of [place] in Chart? No - copy requested - - No - copy requested Yes Yes -  Would patient like information on creating a medical advance  directive? - - No - Patient declined - - - -    Current Medications (verified) Outpatient Encounter Medications as of 10/04/2020  Medication Sig   albuterol (VENTOLIN HFA) 108 (90 Base) MCG/ACT inhaler Inhale 2 puffs into the lungs every 6 (six) hours as needed for wheezing or shortness of breath.   amLODipine (NORVASC) 2.5 MG tablet Take 2.5 mg by mouth daily.   aspirin 81 MG EC tablet Take 81 mg by mouth every morning.   Cholecalciferol (VITAMIN D) 2000 UNITS tablet Take 2,000 Units by mouth daily.   docusate sodium (COLACE) 100 MG capsule Take 1 capsule by mouth every other day   fluticasone (FLONASE) 50 MCG/ACT nasal spray Place 2 sprays into both nostrils daily as needed for allergies or rhinitis.    Fluticasone-Salmeterol (ADVAIR DISKUS) 250-50 MCG/DOSE AEPB Inhale 1 puff into the lungs 2 (two) times daily.   furosemide (LASIX) 40 MG tablet Take 1 tablet (40 mg total) by mouth daily as needed for fluid or edema.   memantine (NAMENDA TITRATION PAK) tablet pack 5 mg/day for =1 week; 5 mg twice daily for =1 week; 15 mg/day given in 5 mg and 10 mg separated doses for =1 week; then 10 mg twice daily   memantine (NAMENDA) 10 MG tablet Take 1 tablet (10 mg total) by mouth 2 (two) times daily. Start after finishing the namenda starter pack first   metoprolol succinate (TOPROL-XL) 50 MG 24 hr tablet TAKE 1 TABLET BY MOUTH IN THE MORNING AND 1/2 TABLET IN THE EVENING   Multiple Vitamin (MULTIVITAMIN WITH MINERALS) TABS Take 1 tablet by mouth daily.  multivitamin-lutein (OCUVITE-LUTEIN) CAPS capsule Take 1 capsule by mouth daily.   nitroGLYCERIN (NITROSTAT) 0.4 MG SL tablet Place 1 tablet (0.4 mg total) under the tongue every 5 (five) minutes as needed for chest pain (MAX 3 TABLETS).   omeprazole (PRILOSEC) 20 MG capsule Take 1 capsule (20 mg total) by mouth 2 (two) times daily.   ondansetron (ZOFRAN) 4 MG tablet TAKE 1 TABLET BY MOUTH THREE TIMES DAILY AS NEEDED FOR NAUSEA AND VOMITING   ondansetron  (ZOFRAN-ODT) 4 MG disintegrating tablet DISSOLVE 1 TABLET IN MOUTH EVERY 8 HOURS AS NEEDED FOR NAUSEA OR VOMITING   Pirfenidone (ESBRIET) 267 MG TABS TAKE 2 TABLETS (534 MG TOTAL) BY MOUTH IN THE MORNING, AT NOON, AND AT BEDTIME.   potassium chloride SA (KLOR-CON M20) 20 MEQ tablet TAKE ONE TABLET BY MOUTH ONCE DAILY WHEN  YOU  TAKE LASIX   Simethicone 125 MG CAPS Take by mouth as directed. Reported on 10/10/2015 one after each meal   simvastatin (ZOCOR) 20 MG tablet Take 1 tablet (20 mg total) by mouth at bedtime.   vitamin C (ASCORBIC ACID) 500 MG tablet Take 500 mg by mouth daily.   No facility-administered encounter medications on file as of 10/04/2020.    Allergies (verified) Patient has no known allergies.   History: Past Medical History:  Diagnosis Date   Allergic rhinitis    Allergy    Arthritis 06/04/2014   Trial of sulindac 05/30/14 since cri risk with other nsaids    Barrett's esophagus 10/27/2011   BPH (benign prostatic hyperplasia) 02/20/2014   CAD (coronary artery disease) of artery bypass graft, Occluded VG-non dominant LCX medical therapy 11/25/2013   CAD (coronary artery disease), native coronary artery - LIMA-LAD, SVG-Diag, SVG-RCA 2002    Catheterization 2002 showing occluded LAD, 99% diagonal, occluded distal circumflex, 90% nondominant right coronary artery  CABG 08/06/00 by Dr. Nils Pyle with LIMA to LAD, SVG to diagonal, SVG to right coronary artery, circumflex was noted to be too small to graft     CAP (community acquired pneumonia) 04/16/2016   See cxr 04/15/16 > treated with 7 days of Levaquin 500 mg daily with resolution radiographically and clinically.   Cataract    Cerumen impaction 06/05/2015   CHF (congestive heart failure), NYHA class III ([place]) 02/20/2014   Dyspnea    with exertion    Dyspnea on exertion 11/08/2013   Followed as Primary Care Patient/ [facility]/ [name]  - new onset early June 2015  - 11/08/2013  Walked RA x 3 laps @ 185 ft each stopped due to   End of study, mild sob, no cp and no desat, EKG ok  - Cardiac w/u inconclusive but not clearly having ischemia  11/24/13   - 09/11/2016  Walked RA x 3 laps @ 185 ft each stopped due to  End of study, nl pace, no sob or desat   - Spirometry 09/11/2016     Essential hypertension    Referred to Int med [place] 12/12/2016    GERD (gastroesophageal reflux disease)    H/O hiatal hernia    History of skin cancer    bilateral arms with removal   Hyperlipidemia    Hyperlipidemia LDL goal <70    Followed as Primary Care Patient/ [facility]/ [name]     - Target LDL < 70 as has IHD     Hypertension    Hypertensive heart disease    Followed as Primary Care Patient/ [facility]/ [name]  Ischemic heart disease    Left femoral hernia s/p laparoscopic repair 05/28/2017 05/28/2017   Lumbar disc disease    Myocardial infarction [facility]) 2002   Right inguinal hernia s/p laparoscopic repair 05/28/2017 05/28/2017   Stage 3 chronic kidney disease ([place]) 05/22/2015   trial off lasix/ppi/clinoril / [place] and renal eval 05/22/2015 >>>     T wave inversion in EKG 02/20/2014   Unstable angina ([place]) 11/23/2013   Past Surgical History:  Procedure Laterality Date   COLONOSCOPY     CORONARY ARTERY BYPASS GRAFT  2002   LIMA-LAD, SVG-Diag, SVG-RCA   EYE SURGERY Bilateral 1995, 2000   ioc for cataracts   GROIN DISSECTION  01/23/2012   Procedure: Virl Son EXPLORATION;  Surgeon: Harl Bowie, MD;  Location: [facility];  Service: General;  Laterality: Left;  Left Inguinal Exploration, release of scar tissue, Placement of Mesh   INGUINAL HERNIA REPAIR Left [place] Bilateral 05/28/2017   Procedure: LAPAROSCOPIC RIGHT AND LEFT INGUINAL HERNIA REPAIR WITH MESH;  Surgeon: Michael Boston, MD;  Location: [facility];  Service: General;  Laterality: Bilateral;   INSERTION OF MESH Bilateral 05/28/2017   Procedure: INSERTION OF MESH;  Surgeon: Michael Boston, MD;  Location: [facility];  Service: General;  Laterality: Bilateral;   LEFT HEART CATHETERIZATION WITH CORONARY ANGIOGRAM N/A 11/24/2013   Procedure: LEFT HEART CATHETERIZATION WITH CORONARY ANGIOGRAM;  Surgeon: Peter M Martinique, MD;  Location: [facility] CATH LAB;  Service: Cardiovascular;  Laterality: N/A;   NOSE SURGERY  1985   Family History  Problem Relation Age of Onset   Breast cancer Mother    Diabetes Mother    Hypertension Father    Dementia Father 69       early 27s   Diabetes Sister    COPD Brother        smoker   Colon cancer Neg Hx    Esophageal cancer Neg Hx    Rectal cancer Neg Hx    Stomach cancer Neg Hx    Social History   Socioeconomic History   Marital status: Married    Spouse name: [name]   Number of children: 0   Years of education: Not on file   Highest education level: 11th grade  Occupational History   Occupation: Retired    Fish farm manager: [employer]  Tobacco Use   Smoking status: Never   Smokeless tobacco: Former    Types: Chew    Quit date: 04/28/2014  Vaping Use   Vaping Use: Never used  Substance and Sexual Activity   Alcohol use: No    Alcohol/week: 0.0 standard drinks   Drug use: No   Sexual activity: Never  Other Topics Concern   Not on file  Social History Narrative   Lives with wife   Right Handed   Drinks 1 cup caffeine every once ina while   Social Determinants of Health   Financial Resource Strain: Low Risk    Difficulty of Paying Living Expenses: Not hard at all  Food Insecurity: No Food Insecurity   Worried About Charity fundraiser in the Last Year: Never true   Arboriculturist in the Last Year: Never true  Transportation Needs: No Transportation Needs   Lack of Transportation (Medical): No   Lack of Transportation (Non-Medical): No  Physical Activity: Not on file  Stress: No Stress Concern Present   Feeling of Stress : Not at all  Social Connections: Socially Integrated  Frequency of Communication with Friends and Family: More than  three times a week   Frequency of Social Gatherings with Friends and Family: More than three times a week   Attends Religious Services: More than 4 times per year   Active Member of [employer] or Organizations: Yes   Attends Music therapist: More than 4 times per year   Marital Status: Married    Tobacco Counseling Counseling given: Not Answered   Clinical Intake:  Pre-visit preparation completed: Yes  Pain : No/denies pain     BMI - recorded: 21.95 Nutritional Status: BMI of 19-24  Normal Nutritional Risks: Nausea/ vomitting/ diarrhea Diabetes: No  How often do you need to have someone help you when you read instructions, pamphlets, or other written materials from your doctor or pharmacy?: 1 - Never  Diabetic?No  Interpreter Needed?: No  Information entered by :: Treyvion Durkee, LPN   Activities of Daily Living In your present state of health, do you have any difficulty performing the following activities: 10/04/2020  Hearing? Y  Vision? Y  Difficulty concentrating or making decisions? N  Walking or climbing stairs? N  Dressing or bathing? N  Doing errands, shopping? N  Preparing Food and eating ? N  Using the Toilet? N  In the past six months, have you accidently leaked urine? N  Do you have problems with loss of bowel control? N  Managing your Medications? N  Managing your Finances? N  Housekeeping or managing your Housekeeping? N  Some recent data might be hidden    Patient Care Team: Dettinger, Fransisca Kaufmann, MD as PCP - General (Family Medicine) Michael Boston, MD as Consulting Physician (General Surgery) Evans Lance, MD as Consulting Physician (Cardiology)  Indicate any recent Medical Services you may have received from other than [facility] providers in the past year (date may be approximate).     Assessment:   This is a routine wellness examination for [place].  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 Vision Screening - Comments:: Wears eyeglasses - still has difficulty seeing - up to date with eye exam with Dr Marin Comment  Dietary issues and exercise activities discussed: Current Exercise Habits: Home exercise routine, Type of exercise: walking, Time (Minutes): 10, Frequency (Times/Week): 7, Weekly Exercise (Minutes/Week): 70, Intensity: Mild, Exercise limited by: cardiac condition(s)   Goals Addressed             This Visit's Progress    Exercise 150 min/wk Moderate Activity   Not on track    Increase activity slowly with a goal of 150 minutes a week         Depression Screen PHQ 2/9 Scores 10/04/2020 09/20/2020 09/13/2020 07/06/2020 04/11/2020 09/27/2019 08/11/2019  PHQ - 2 Score 2 2 3  0 0 0 0  PHQ- 9 Score 9 10 15  - 0 - -    Fall Risk Fall Risk  10/04/2020 09/20/2020 09/13/2020 07/06/2020 04/11/2020  Falls in the past year? 1 0 0 0 1  Comment - - - - -  Number falls in past yr: 1 - - - 0  Injury with Fall? 0 - - - 0  Risk Factor Category  - - - - -  Risk for fall due to : History of fall(s);Impaired balance/gait;Impaired vision - - - History of fall(s)  Follow up Falls prevention discussed;Education provided - - - Falls prevention discussed    FALL RISK PREVENTION PERTAINING TO THE HOME:  Any stairs in or around the home? Yes  If so, are there any without handrails? Yes  Home free of loose throw rugs in walkways, pet beds, electrical cords, etc? Yes  Adequate lighting in your home to reduce risk of falls? Yes   ASSISTIVE DEVICES UTILIZED TO PREVENT FALLS:  Life alert? No  Is in the process of getting one Use of a cane, walker or w/c? No  Grab bars in the bathroom? Yes  Shower chair or bench in shower? Yes  Elevated toilet seat or a handicapped toilet? Yes   TIMED UP AND GO:  Was the test performed? No . Telephonic visit.  Cognitive Function: MMSE - Mini Mental State Exam 07/16/2020 09/27/2019 05/19/2017  Orientation to time 3 5 5   Orientation to Place 5 5 5    Registration 3 3 3   Attention/ Calculation 3 2 5   Recall 3 1 1   Language- name 2 objects 2 2 2   Language- repeat 1 1 1   Language- follow 3 step command 3 3 3   Language- read & follow direction 1 1 1   Write a sentence 1 1 1   Copy design 1 0 1  Total score 26 24 28      6CIT Screen 10/04/2020  What Year? 0 points  What month? 0 points  What time? 0 points  Count back from 20 0 points  Months in reverse 4 points  Repeat phrase 2 points  Total Score 6    Immunizations Immunization History  Administered Date(s) Administered   Fluad Quad(high Dose 65+) 03/14/2019   H1N1 05/02/2008   Influenza Split 02/10/2014   Influenza Whole 01/17/2008, 04/09/2009, 01/03/2011, 02/27/2012   Influenza, High Dose Seasonal PF 01/21/2017, 02/11/2018, 01/19/2020   Influenza,inj,Quad PF,6+ Mos 03/07/2013, 06/05/2015, 02/11/2016   Moderna SARS-COV2 Booster Vaccination 04/11/2020   Moderna Sars-Covid-2 Vaccination 08/23/2019, 09/20/2019   Pneumococcal Conjugate-13 03/28/2014   Pneumococcal Polysaccharide-23 01/02/2017   Tdap 06/30/2011, 05/30/2014, 02/18/2020    TDAP status: Up to date  Flu Vaccine status: Up to date  Pneumococcal vaccine status: Up to date  Covid-19 vaccine status: Completed vaccines  Qualifies for Shingles Vaccine? Yes   Zostavax completed No   Shingrix Completed?: No.    Education has been provided regarding the importance of this vaccine. Patient has been advised to call insurance company to determine out of pocket expense if they have not yet received this vaccine. Advised may also receive vaccine at local pharmacy or Health Dept. Verbalized acceptance and understanding.  Screening Tests Health Maintenance  Topic Date Due   COVID-19 Vaccine (4 - Booster for Moderna series) 12/17/2020 (Originally 07/10/2020)   Zoster Vaccines- Shingrix (1 of 2) 12/21/2020 (Originally 12/13/1959)   Hepatitis C Screening  02/09/2021 (Originally 12/13/1958)   INFLUENZA VACCINE  11/26/2020    TETANUS/TDAP  02/17/2030   PNA vac Low Risk Adult  Completed   HPV VACCINES  Aged Out    Health Maintenance  There are no preventive care reminders to display for this patient.  Colorectal cancer screening: No longer required.   Lung Cancer Screening: (Low Dose CT Chest recommended if Age 24-80 years, 30 pack-year currently smoking OR have quit w/in 15years.) does not qualify.   Additional Screening:  Hepatitis C Screening: does not qualify  Vision Screening: Recommended annual ophthalmology exams for early detection of glaucoma and other disorders of the eye. Is the patient up to date with their annual eye exam?  Yes  Who is the provider or what is the name of the office  in which the patient attends annual eye exams? Dr Marin Comment If pt is not established with a provider, would they like to be referred to a provider to establish care? No .   Dental Screening: Recommended annual dental exams for proper oral hygiene  Community Resource Referral / Chronic Care Management: CRR required this visit?  No   CCM required this visit?  No      Plan:     I have personally reviewed and noted the following in the patient's chart:   Medical and social history Use of alcohol, tobacco or illicit drugs  Current medications and supplements including opioid prescriptions. Patient is not currently taking opioid prescriptions. Functional ability and status Nutritional status Physical activity Advanced directives List of other physicians Hospitalizations, surgeries, and ER visits in previous 12 months Vitals Screenings to include cognitive, depression, and falls Referrals and appointments  In addition, I have reviewed and discussed with patient certain preventive protocols, quality metrics, and best practice recommendations. A written personalized care plan for preventive services as well as general preventive health recommendations were provided to patient.     Sandrea Hammond, LPN   2/0/2542    Nurse Notes: None

## 2020-10-10 ENCOUNTER — Encounter: Payer: Self-pay | Admitting: Neurology

## 2020-10-10 ENCOUNTER — Ambulatory Visit (INDEPENDENT_AMBULATORY_CARE_PROVIDER_SITE_OTHER): Payer: Medicare Other | Admitting: Neurology

## 2020-10-10 VITALS — BP 109/59 | HR 62 | Ht 70.5 in | Wt 153.8 lb

## 2020-10-10 DIAGNOSIS — F0280 Dementia in other diseases classified elsewhere without behavioral disturbance: Secondary | ICD-10-CM

## 2020-10-10 DIAGNOSIS — G309 Alzheimer's disease, unspecified: Secondary | ICD-10-CM | POA: Diagnosis not present

## 2020-10-10 DIAGNOSIS — R413 Other amnesia: Secondary | ICD-10-CM | POA: Diagnosis not present

## 2020-10-10 NOTE — Progress Notes (Signed)
[facility] [address] [place]. [place] [address] [phone]       OFFICE FOLLOW UP VISIT NOTE  [name] Date of Birth:  1940-05-10 Medical Record Number:  [id number]   Referring MD: Vonna Kotyk Dettinger PA-C Reason for Referral: Memory loss  HPI: Initial consult 07/16/2020:Isaac [name] is a 80 year old pleasant Caucasian male seen today for initial office consultation visit for memory loss.  He is accompanied by his wife Isaac [name].  History is obtained from them and review of electronic medical records personally reviewed pertinent imaging films in PACS.  He has past medical history of coronary artery disease, benign prostatic hypertrophy, congestive heart failure, hiatal hernia and gastroesophageal reflux disease and hypertension.  He has had greater than 1 year history of worsening short-term memory and cognitive difficulties.  He often misplaces objects.  He was also been having some auditory as well as sometimes visual hallucinations.  He has trouble completing sentences and at times searches for words.  Is mostly however independent in actives of daily living.  Patient does have bad congestive heart failure and cannot walk long distances.  He does have family history of Alzheimer's and dad died in the 59s.  His Mini-Mental status exam score today was 26/30 in the past it has been documented as being 28 and 26 over the last 3 years..  He has not tried medications like Aricept or Namenda yet.  He did have vitamin B12, TSH and RPR checked on 09/27/2019 which were normal.  MRI scan of the brain on 10/20/2019 showed asymmetric atrophy of the right hippocampus.  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  Has dilated CSF spaces.  CT scan of the head on 02/18/2020 was unremarkable.  He has not had an EEG done yet. Update 10/10/2020: He returns for follow-up after last visit 3 months ago.  Is accompanied by his wife.  He has been able to tolerate Namenda  but he and his wife do not feel there is much improvement.  She has not had any major neurological worsening either.  He seems to be quite during the day.  Patient and wife feel this is related to new cardiac medication he has been taking called Esbriet.  Advised him to discuss this with his cardiologist.  On Mini-Mental status testing today scored 21/30 but had trouble with reading and writing.  He is not had any unsafe behaviors, delusions, hallucinations or agitation.  He had EEG done after last visit on 08/29/2020 which showed mild generalized slowing which is nonspecific but no definite epileptiform activity noted. ROS:   14 system review of systems is positive for memory loss, misplacing things, auditory and visual hallucinations, shortness of breath, decreased hearing all other systems negative  PMH:  Past Medical History:  Diagnosis Date   Allergic rhinitis    Allergy    Arthritis 06/04/2014   Trial of sulindac 05/30/14 since cri risk with other nsaids    Barrett's esophagus 10/27/2011   BPH (benign prostatic hyperplasia) 02/20/2014   CAD (coronary artery disease) of artery bypass graft, Occluded VG-non dominant LCX medical therapy 11/25/2013   CAD (coronary artery disease), native coronary artery - LIMA-LAD, SVG-Diag, SVG-RCA 2002    Catheterization 2002 showing occluded LAD, 99% diagonal, occluded distal circumflex, 90% nondominant right coronary artery  CABG 08/06/00 by Dr. Nils Pyle with LIMA to LAD, SVG to diagonal, SVG to right coronary artery, circumflex was noted to be too small to graft  CAP (community acquired pneumonia) 04/16/2016   See cxr 04/15/16 > treated with 7 days of Levaquin 500 mg daily with resolution radiographically and clinically.   Cataract    Cerumen impaction 06/05/2015   CHF (congestive heart failure), NYHA class III ([place]) 02/20/2014   Dyspnea    with exertion    Dyspnea on exertion 11/08/2013   Followed as Primary Care Patient/ [facility]/ [name]  - new onset  early June 2015  - 11/08/2013  Walked RA x 3 laps @ 185 ft each stopped due to  End of study, mild sob, no cp and no desat, EKG ok  - Cardiac w/u inconclusive but not clearly having ischemia  11/24/13   - 09/11/2016  Walked RA x 3 laps @ 185 ft each stopped due to  End of study, nl pace, no sob or desat   - Spirometry 09/11/2016     Essential hypertension    Referred to Int med [facility] 12/12/2016    GERD (gastroesophageal reflux disease)    H/O hiatal hernia    History of skin cancer    bilateral arms with removal   Hyperlipidemia    Hyperlipidemia LDL goal <70    Followed as Primary Care Patient/ [facility]/ [name]     - Target LDL < 70 as has IHD     Hypertension    Hypertensive heart disease    Followed as Primary Care Patient/ [facility]/ [name]      Ischemic heart disease    Left femoral hernia s/p laparoscopic repair 05/28/2017 05/28/2017   Lumbar disc disease    Myocardial infarction ([place]) 2002   Right inguinal hernia s/p laparoscopic repair 05/28/2017 05/28/2017   Stage 3 chronic kidney disease ([place]) 05/22/2015   trial off lasix/ppi/clinoril / [place] and renal eval 05/22/2015 >>>     T wave inversion in EKG 02/20/2014   Unstable angina ([place]) 11/23/2013    Social History:  Social History   Socioeconomic History   Marital status: Married    Spouse name: Isaac [name]   Number of children: 0   Years of education: Not on file   Highest education level: 11th grade  Occupational History   Occupation: Retired    Fish farm manager: [employer]  Tobacco Use   Smoking status: Never   Smokeless tobacco: Former    Types: Chew    Quit date: 04/28/2014  Vaping Use   Vaping Use: Never used  Substance and Sexual Activity   Alcohol use: No    Alcohol/week: 0.0 standard drinks   Drug use: No   Sexual activity: Never  Other Topics Concern   Not on file  Social History Narrative   Lives with wife   Right Handed   Drinks 1 cup caffeine every once ina while   Social Determinants of  Health   Financial Resource Strain: Low Risk    Difficulty of Paying Living Expenses: Not hard at all  Food Insecurity: No Food Insecurity   Worried About Charity fundraiser in the Last Year: Never true   Arboriculturist in the Last Year: Never true  Transportation Needs: No Transportation Needs   Lack of Transportation (Medical): No   Lack of Transportation (Non-Medical): No  Physical Activity: Not on file  Stress: No Stress Concern Present   Feeling of Stress : Not at all  Social Connections: Socially Integrated   Frequency of Communication with Friends and Family: More than three times a week   Frequency of Social  Gatherings with Friends and Family: More than three times a week   Attends Religious Services: More than 4 times per year   Active Member of Clubs or Organizations: Yes   Attends Music therapist: More than 4 times per year   Marital Status: Married  Human resources officer Violence: Not At Risk   Fear of Current or Ex-Partner: No   Emotionally Abused: No   Physically Abused: No   Sexually Abused: No    Medications:   Current Outpatient Medications on File Prior to Visit  Medication Sig Dispense Refill   albuterol (VENTOLIN HFA) 108 (90 Base) MCG/ACT inhaler Inhale 2 puffs into the lungs every 6 (six) hours as needed for wheezing or shortness of breath. 18 g 3   amLODipine (NORVASC) 2.5 MG tablet Take 2.5 mg by mouth daily.     aspirin 81 MG EC tablet Take 81 mg by mouth every morning.     Cholecalciferol (VITAMIN D) 2000 UNITS tablet Take 2,000 Units by mouth daily.     docusate sodium (COLACE) 100 MG capsule Take 1 capsule by mouth every other day     fluticasone (FLONASE) 50 MCG/ACT nasal spray Place 2 sprays into both nostrils daily as needed for allergies or rhinitis.      Fluticasone-Salmeterol (ADVAIR DISKUS) 250-50 MCG/DOSE AEPB Inhale 1 puff into the lungs 2 (two) times daily. 60 each 11   furosemide (LASIX) 40 MG tablet Take 1 tablet (40 mg total) by  mouth daily as needed for fluid or edema. 90 tablet 3   memantine (NAMENDA) 10 MG tablet Take 1 tablet (10 mg total) by mouth 2 (two) times daily. Start after finishing the namenda starter pack first 60 tablet 3   metoprolol succinate (TOPROL-XL) 50 MG 24 hr tablet TAKE 1 TABLET BY MOUTH IN THE MORNING AND 1/2 TABLET IN THE EVENING 135 tablet 3   Multiple Vitamin (MULTIVITAMIN WITH MINERALS) TABS Take 1 tablet by mouth daily.     multivitamin-lutein (OCUVITE-LUTEIN) CAPS capsule Take 1 capsule by mouth daily.     nitroGLYCERIN (NITROSTAT) 0.4 MG SL tablet Place 1 tablet (0.4 mg total) under the tongue every 5 (five) minutes as needed for chest pain (MAX 3 TABLETS). 10 tablet 6   omeprazole (PRILOSEC) 20 MG capsule Take 1 capsule (20 mg total) by mouth 2 (two) times daily. 180 capsule 3   ondansetron (ZOFRAN) 4 MG tablet TAKE 1 TABLET BY MOUTH THREE TIMES DAILY AS NEEDED FOR NAUSEA AND VOMITING 30 tablet 1   ondansetron (ZOFRAN-ODT) 4 MG disintegrating tablet DISSOLVE 1 TABLET IN MOUTH EVERY 8 HOURS AS NEEDED FOR NAUSEA OR VOMITING 20 tablet 0   Pirfenidone (ESBRIET) 267 MG TABS TAKE 2 TABLETS (534 MG TOTAL) BY MOUTH IN THE MORNING, AT NOON, AND AT BEDTIME. 180 tablet 5   potassium chloride SA (KLOR-CON M20) 20 MEQ tablet TAKE ONE TABLET BY MOUTH ONCE DAILY WHEN  YOU  TAKE LASIX 30 tablet 5   Simethicone 125 MG CAPS Take by mouth as directed. Reported on 10/10/2015 one after each meal     simvastatin (ZOCOR) 20 MG tablet Take 1 tablet (20 mg total) by mouth at bedtime. 90 tablet 3   vitamin C (ASCORBIC ACID) 500 MG tablet Take 500 mg by mouth daily.     No current facility-administered medications on file prior to visit.    Allergies:  No Known Allergies  Physical Exam General: well developed, well nourished, seated, in no evident distress Head: head normocephalic and  atraumatic.   Neck: supple with no carotid or supraclavicular bruits Cardiovascular: regular rate and rhythm, no  murmurs Musculoskeletal: no deformity except mild kyphosis Skin:  no rash scattered bilateral forearm petichiae Vascular:  Normal pulses all extremities  Neurologic Exam Mental Status: Awake and fully alert. Oriented to place and time. Recent and remote memory intact. Attention span, concentration and fund of knowledge appropriate. Mood and affect appropriate.  Mini-Mental status exam 21/30 ( last visit score 26/30.)  Diminished recall 1/3.  Able to name 7 animals that can walk on 4 legs.  Clock drawing 3/4.  Slight difficulty with copying intersecting pentagons. Cranial Nerves: Fundoscopic exam not done. Pupils equal, briskly reactive to light. Extraocular movements full without nystagmus. Visual fields full to confrontation. Hearing intact. Facial sensation intact. Face, tongue, palate moves normally and symmetrically.  Motor: Normal bulk and tone. Normal strength in all tested extremity muscles. Sensory.: intact to touch , pinprick , position and vibratory sensation.  Coordination: Rapid alternating movements normal in all extremities. Finger-to-nose and heel-to-shin performed accurately bilaterally. Gait and Station: Arises from chair without difficulty. Stance is stooped with mild kyphosis.  L. Gait demonstrates normal stride length and balance . Able to heel, toe and tandem walk with mild difficulty.  Reflexes: 1+ and symmetric. Toes downgoing.     MMSE - Mini Mental State Exam 10/10/2020 07/16/2020 09/27/2019  Orientation to time 3 3 5   Orientation to Place 5 5 5   Registration 3 3 3   Attention/ Calculation 1 3 2   Recall 0 3 1  Language- name 2 objects 2 2 2   Language- repeat 1 1 1   Language- follow 3 step command 3 3 3   Language- read & follow direction 1 1 1   Write a sentence 1 1 1   Copy design 1 1 0  Total score 21 26 24      ASSESSMENT: 80 year old Caucasian male with 1 year history of progressive short-term memory and cognitive difficulties likely due to mild dementia of  Alzheimer's type     PLAN: I had a long discussion with the patient and his wife regarding his mild dementia and he seems to have been tolerating Namenda well but has not had any major benefit yet.  Recommend continue Namenda and the current dose of 10 mg twice daily and participate in cognitively challenging activities like solving crossword puzzles, playing bridge and sodoku.  Will return for follow-up in the future in 6 months with nurse practitioner Janett Billow or call earlier if necessary. Greater than 50% time during this 25-minute   visit was spent on counseling and coordination of care about his memory loss and dementia and answering questions Antony Contras, MD  [facility] [address] [place], [place] [address]  Phone [phone] Fax [phone] Note: This document was prepared with digital dictation and possible smart phrase technology. Any transcriptional errors that result from this process are unintentional.

## 2020-10-10 NOTE — Patient Instructions (Signed)
I had a long discussion with the patient and his wife regarding his mild dementia and he seems to have been tolerating Namenda well but has not had any major benefit yet.  Recommend continue Namenda and the current dose of 10 mg twice daily and participate in cognitively challenging activities like solving crossword puzzles, playing bridge and sodoku.  Will return for follow-up in the future in 6 months with nurse practitioner Janett Billow or call earlier if necessary.

## 2020-10-17 ENCOUNTER — Other Ambulatory Visit (HOSPITAL_COMMUNITY): Payer: Self-pay

## 2020-10-18 ENCOUNTER — Other Ambulatory Visit (HOSPITAL_COMMUNITY): Payer: Self-pay

## 2020-10-22 ENCOUNTER — Emergency Department (HOSPITAL_COMMUNITY)
Admission: EM | Admit: 2020-10-22 | Discharge: 2020-10-22 | Disposition: A | Discharge status: Home / Self Care | Payer: Medicare Other | Attending: Emergency Medicine | Admitting: Emergency Medicine

## 2020-10-22 ENCOUNTER — Other Ambulatory Visit: Payer: Self-pay

## 2020-10-22 ENCOUNTER — Emergency Department (HOSPITAL_COMMUNITY): Payer: Medicare Other

## 2020-10-22 ENCOUNTER — Encounter (HOSPITAL_COMMUNITY): Payer: Self-pay

## 2020-10-22 DIAGNOSIS — Z85828 Personal history of other malignant neoplasm of skin: Secondary | ICD-10-CM | POA: Diagnosis not present

## 2020-10-22 DIAGNOSIS — Z8616 Personal history of COVID-19: Secondary | ICD-10-CM | POA: Diagnosis not present

## 2020-10-22 DIAGNOSIS — Z951 Presence of aortocoronary bypass graft: Secondary | ICD-10-CM | POA: Insufficient documentation

## 2020-10-22 DIAGNOSIS — Z87891 Personal history of nicotine dependence: Secondary | ICD-10-CM | POA: Diagnosis not present

## 2020-10-22 DIAGNOSIS — I951 Orthostatic hypotension: Secondary | ICD-10-CM | POA: Insufficient documentation

## 2020-10-22 DIAGNOSIS — N183 Chronic kidney disease, stage 3 unspecified: Secondary | ICD-10-CM | POA: Insufficient documentation

## 2020-10-22 DIAGNOSIS — R42 Dizziness and giddiness: Secondary | ICD-10-CM | POA: Diagnosis not present

## 2020-10-22 DIAGNOSIS — Z79899 Other long term (current) drug therapy: Secondary | ICD-10-CM | POA: Insufficient documentation

## 2020-10-22 DIAGNOSIS — I2510 Atherosclerotic heart disease of native coronary artery without angina pectoris: Secondary | ICD-10-CM | POA: Diagnosis not present

## 2020-10-22 DIAGNOSIS — K449 Diaphragmatic hernia without obstruction or gangrene: Secondary | ICD-10-CM | POA: Insufficient documentation

## 2020-10-22 DIAGNOSIS — N200 Calculus of kidney: Secondary | ICD-10-CM | POA: Diagnosis not present

## 2020-10-22 DIAGNOSIS — R531 Weakness: Secondary | ICD-10-CM | POA: Diagnosis not present

## 2020-10-22 DIAGNOSIS — Z7982 Long term (current) use of aspirin: Secondary | ICD-10-CM | POA: Insufficient documentation

## 2020-10-22 DIAGNOSIS — M549 Dorsalgia, unspecified: Secondary | ICD-10-CM | POA: Insufficient documentation

## 2020-10-22 DIAGNOSIS — I959 Hypotension, unspecified: Secondary | ICD-10-CM | POA: Diagnosis not present

## 2020-10-22 DIAGNOSIS — I700 Atherosclerosis of aorta: Secondary | ICD-10-CM | POA: Diagnosis not present

## 2020-10-22 DIAGNOSIS — J80 Acute respiratory distress syndrome: Secondary | ICD-10-CM | POA: Diagnosis not present

## 2020-10-22 DIAGNOSIS — I130 Hypertensive heart and chronic kidney disease with heart failure and stage 1 through stage 4 chronic kidney disease, or unspecified chronic kidney disease: Secondary | ICD-10-CM | POA: Diagnosis not present

## 2020-10-22 DIAGNOSIS — I509 Heart failure, unspecified: Secondary | ICD-10-CM | POA: Diagnosis not present

## 2020-10-22 DIAGNOSIS — K219 Gastro-esophageal reflux disease without esophagitis: Secondary | ICD-10-CM | POA: Diagnosis not present

## 2020-10-22 DIAGNOSIS — M4856XA Collapsed vertebra, not elsewhere classified, lumbar region, initial encounter for fracture: Secondary | ICD-10-CM | POA: Diagnosis not present

## 2020-10-22 LAB — URINALYSIS, ROUTINE W REFLEX MICROSCOPIC
Bilirubin Urine: NEGATIVE
Glucose, UA: NEGATIVE mg/dL
Hgb urine dipstick: NEGATIVE
Ketones, ur: NEGATIVE mg/dL
Leukocytes,Ua: NEGATIVE
Nitrite: NEGATIVE
Protein, ur: NEGATIVE mg/dL
Specific Gravity, Urine: 1.009 (ref 1.005–1.030)
pH: 5 (ref 5.0–8.0)

## 2020-10-22 LAB — CBC
HCT: 39.9 % (ref 39.0–52.0)
Hemoglobin: 13.3 g/dL (ref 13.0–17.0)
MCH: 32.4 pg (ref 26.0–34.0)
MCHC: 33.3 g/dL (ref 30.0–36.0)
MCV: 97.3 fL (ref 80.0–100.0)
Platelets: 144 10*3/uL — ABNORMAL LOW (ref 150–400)
RBC: 4.1 MIL/uL — ABNORMAL LOW (ref 4.22–5.81)
RDW: 14 % (ref 11.5–15.5)
WBC: 7.9 10*3/uL (ref 4.0–10.5)
nRBC: 0 % (ref 0.0–0.2)

## 2020-10-22 LAB — BASIC METABOLIC PANEL
Anion gap: 5 (ref 5–15)
BUN: 22 mg/dL (ref 8–23)
CO2: 26 mmol/L (ref 22–32)
Calcium: 9.1 mg/dL (ref 8.9–10.3)
Chloride: 109 mmol/L (ref 98–111)
Creatinine, Ser: 1.66 mg/dL — ABNORMAL HIGH (ref 0.61–1.24)
GFR, Estimated: 42 mL/min — ABNORMAL LOW (ref >60)
Glucose, Bld: 99 mg/dL (ref 70–99)
Potassium: 4.7 mmol/L (ref 3.5–5.1)
Sodium: 140 mmol/L (ref 135–145)

## 2020-10-22 LAB — HEPATIC FUNCTION PANEL
ALT: 11 U/L (ref 0–44)
AST: 16 U/L (ref 15–41)
Albumin: 4.2 g/dL (ref 3.5–5.0)
Alkaline Phosphatase: 60 U/L (ref 38–126)
Bilirubin, Direct: 0.1 mg/dL (ref 0.0–0.2)
Indirect Bilirubin: 0.6 mg/dL (ref 0.3–0.9)
Total Bilirubin: 0.7 mg/dL (ref 0.3–1.2)
Total Protein: 7.4 g/dL (ref 6.5–8.1)

## 2020-10-22 LAB — CBG MONITORING, ED
Glucose-Capillary: 95 mg/dL (ref 70–99)
Glucose-Capillary: 71 mg/dL (ref 70–99)

## 2020-10-22 LAB — MAGNESIUM: Magnesium: 2.2 mg/dL (ref 1.7–2.4)

## 2020-10-22 MED ORDER — SODIUM CHLORIDE 0.9 % IV BOLUS
500.0000 mL | Freq: Once | INTRAVENOUS | Status: AC
  Administered 2020-10-22: 500 mL via INTRAVENOUS

## 2020-10-22 NOTE — Discharge Instructions (Signed)
Your work-up today was overall reassuring.  There were no signs of acute infection.  Your lab work was normal. It is important that you follow-up with your primary care doctor for further evaluation management of your orthostatic hypotension. Return to the emergency room for any new commotion, concerning symptoms.

## 2020-10-22 NOTE — ED Provider Notes (Signed)
Emergency Medicine Provider Triage Evaluation Note  Isaac [name] , a 80 y.o. male  was evaluated in triage.  Pt complains of who presents with concern for episodes of lightheadedness and generalized weakness with multiple falls x4 in the last week.  Patient's wife is at bedside endorses worsening memory, and starting Namenda today.  States she brought him in because his blood pressure was low with systolic in the 95V at home today at the time he was endorsing significant weakness.  States he has been feeling generally poor in health since he had COVID in February 2022.[name]  Review of Systems  Positive: Weakness, lightheadedness, falls, anorexia Negative: Chest pain, shortness of breath, palpitations  Physical Exam  BP 136/66 (BP Location: Right Arm)   Pulse (!) 57   Temp 98.1 F (36.7 C) (Oral)   Resp 16   Ht 5' 10.5" (1.791 m)   Wt 70.3 kg   SpO2 100%   BMI 21.93 kg/m  Gen:   Awake, no distress   Resp:  Normal effort  MSK:   Moves extremities without difficulty  Other:  Lungs CTA B, heart rate mildly low in the high 50s with regular rhythm.  No murmur/R/G  Medical Decision Making  Medically screening exam initiated at 2:14 PM.  Appropriate orders placed.  [name] was informed that the remainder of the evaluation will be completed by another provider, this initial triage assessment does not replace that evaluation, and the importance of remaining in the ED until their evaluation is complete.  This chart was dictated using voice recognition software, Dragon. Despite the best efforts of this provider to proofread and correct errors, errors may still occur which can change documentation meaning.    Aura Dials 10/22/20 1423    Wyvonnia Dusky, MD 10/22/20 Lurline Hare

## 2020-10-22 NOTE — ED Triage Notes (Addendum)
Patient's wife states she checked the patient's BP today because he was c/o weakness and dizziness. Patient's wife reports the BP at home was 81/59.  Patient's wife added that the patient has had frequent falls   BP in triage- 136/66

## 2020-10-22 NOTE — ED Provider Notes (Signed)
[facility] DEPT Provider Note   CSN: [id number] Arrival date & time: 10/22/20  1341     History Chief Complaint  Patient presents with   Hypotension   Weakness   Back Pain    Isaac [name] is a 80 y.o. male presenting for evaluation of hypotension and generalized weakness.  Patient states he has had issues with weakness, nausea, dizziness, loss of appetite for over a year since he tested positive for COVID.  Dizziness is intermittent, more likely to happen when he is walking or standing.  Today blood pressure was noted to be in the 80s, prompting ER visit.  No recent change in medication.  This has been worked up by PCP, attempting to adjust medications.  No fevers, chills, chest pain, breath, cough, abdominal pain, urinary symptoms.  Additional history cancer chart review.  History of hypertension, hyperlipidemia, CAD, GERD, CHF, CKD  HPI     Past Medical History:  Diagnosis Date   Allergic rhinitis    Allergy    Arthritis 06/04/2014   Trial of sulindac 05/30/14 since cri risk with other nsaids    Barrett's esophagus 10/27/2011   BPH (benign prostatic hyperplasia) 02/20/2014   CAD (coronary artery disease) of artery bypass graft, Occluded VG-non dominant LCX medical therapy 11/25/2013   CAD (coronary artery disease), native coronary artery - LIMA-LAD, SVG-Diag, SVG-RCA 2002    Catheterization 2002 showing occluded LAD, 99% diagonal, occluded distal circumflex, 90% nondominant right coronary artery  CABG 08/06/00 by Dr. Nils Pyle with LIMA to LAD, SVG to diagonal, SVG to right coronary artery, circumflex was noted to be too small to graft     CAP (community acquired pneumonia) 04/16/2016   See cxr 04/15/16 > treated with 7 days of Levaquin 500 mg daily with resolution radiographically and clinically.   Cataract    Cerumen impaction 06/05/2015   CHF (congestive heart failure), NYHA class III ([place]) 02/20/2014   Dyspnea    with exertion    Dyspnea on  exertion 11/08/2013   Followed as Primary Care Patient/ [facility]/ [name]  - new onset early June 2015  - 11/08/2013  Walked RA x 3 laps @ 185 ft each stopped due to  End of study, mild sob, no cp and no desat, EKG ok  - Cardiac w/u inconclusive but not clearly having ischemia  11/24/13   - 09/11/2016  Walked RA x 3 laps @ 185 ft each stopped due to  End of study, nl pace, no sob or desat   - Spirometry 09/11/2016     Essential hypertension    Referred to Int med [facility] 12/12/2016    GERD (gastroesophageal reflux disease)    H/O hiatal hernia    History of skin cancer    bilateral arms with removal   Hyperlipidemia    Hyperlipidemia LDL goal <70    Followed as Primary Care Patient/ [facility]/ [name]     - Target LDL < 70 as has IHD     Hypertension    Hypertensive heart disease    Followed as Primary Care Patient/ [facility]/ [name]      Ischemic heart disease    Left femoral hernia s/p laparoscopic repair 05/28/2017 05/28/2017   Lumbar disc disease    Myocardial infarction [facility]) 2002   Right inguinal hernia s/p laparoscopic repair 05/28/2017 05/28/2017   Stage 3 chronic kidney disease ([place]) 05/22/2015   trial off lasix/ppi/clinoril / [place] and renal eval 05/22/2015 >>>  T wave inversion in EKG 02/20/2014   Unstable angina ([facility]) 11/23/2013    Patient Active Problem List   Diagnosis Date Noted   Hallucination 09/27/2019   Right inguinal hernia s/p laparoscopic repair 05/28/2017 05/28/2017   Left femoral hernia s/p laparoscopic repair 05/28/2017 05/28/2017   Cerumen impaction 06/05/2015   Stage 3 chronic kidney disease ([place]) 05/22/2015   Arthritis 06/04/2014   CHF (congestive heart failure), NYHA class III ([place]) 02/20/2014   BPH (benign prostatic hyperplasia) 02/20/2014   T wave inversion in EKG 02/20/2014   Coronary artery disease 11/25/2013   Essential hypertension    Unstable angina ([place]) 11/23/2013   Dyspnea on exertion 11/08/2013   Barrett's esophagus 10/27/2011    GERD (gastroesophageal reflux disease) 07/14/2011   CAD (coronary artery disease), native coronary artery - LIMA-LAD, SVG-Diag, SVG-RCA 2002    Hyperlipidemia LDL goal <70    Hypertensive heart disease     Past Surgical History:  Procedure Laterality Date   COLONOSCOPY     CORONARY ARTERY BYPASS GRAFT  2002   LIMA-LAD, SVG-Diag, SVG-RCA   EYE SURGERY Bilateral 1995, 2000   ioc for cataracts   GROIN DISSECTION  01/23/2012   Procedure: Virl Son EXPLORATION;  Surgeon: Harl Bowie, MD;  Location: [facility];  Service: General;  Laterality: Left;  Left Inguinal Exploration, release of scar tissue, Placement of Mesh   INGUINAL HERNIA REPAIR Left 1983   INGUINAL HERNIA REPAIR Bilateral 05/28/2017   Procedure: LAPAROSCOPIC RIGHT AND LEFT INGUINAL HERNIA REPAIR WITH MESH;  Surgeon: Michael Boston, MD;  Location: [facility];  Service: General;  Laterality: Bilateral;   INSERTION OF MESH Bilateral 05/28/2017   Procedure: INSERTION OF MESH;  Surgeon: Michael Boston, MD;  Location: [place];  Service: General;  Laterality: Bilateral;   LEFT HEART CATHETERIZATION WITH CORONARY ANGIOGRAM N/A 11/24/2013   Procedure: LEFT HEART CATHETERIZATION WITH CORONARY ANGIOGRAM;  Surgeon: Peter M Martinique, MD;  Location: [facility] CATH LAB;  Service: Cardiovascular;  Laterality: N/A;   NOSE SURGERY  1985       Family History  Problem Relation Age of Onset   Breast cancer Mother    Diabetes Mother    Hypertension Father    Dementia Father 70       early 66s   Diabetes Sister    COPD Brother        smoker   Colon cancer Neg Hx    Esophageal cancer Neg Hx    Rectal cancer Neg Hx    Stomach cancer Neg Hx     Social History   Tobacco Use   Smoking status: Never   Smokeless tobacco: Former    Types: Chew    Quit date: 04/28/2014  Vaping Use   Vaping Use: Never used  Substance Use Topics   Alcohol use: No    Alcohol/week: 0.0 standard drinks   Drug use: No    Home  Medications Prior to Admission medications   Medication Sig Start Date End Date Taking? Authorizing Provider  albuterol (VENTOLIN HFA) 108 (90 Base) MCG/ACT inhaler Inhale 2 puffs into the lungs every 6 (six) hours as needed for wheezing or shortness of breath. 02/09/20   Noemi Chapel P, DO  amLODipine (NORVASC) 2.5 MG tablet Take 2.5 mg by mouth daily. 09/13/20   [provider]  aspirin 81 MG EC tablet Take 81 mg by mouth every morning.    [provider]  Cholecalciferol (VITAMIN D) 2000 UNITS tablet Take 2,000 Units by  mouth daily.    [provider]  docusate sodium (COLACE) 100 MG capsule Take 1 capsule by mouth every other day    [provider]  fluticasone (FLONASE) 50 MCG/ACT nasal spray Place 2 sprays into both nostrils daily as needed for allergies or rhinitis.     [provider]  Fluticasone-Salmeterol (ADVAIR DISKUS) 250-50 MCG/DOSE AEPB Inhale 1 puff into the lungs 2 (two) times daily. 11/01/19   Julian Hy, DO  furosemide (LASIX) 40 MG tablet Take 1 tablet (40 mg total) by mouth daily as needed for fluid or edema. 07/06/20   Dettinger, Fransisca Kaufmann, MD  memantine (NAMENDA) 10 MG tablet Take 1 tablet (10 mg total) by mouth 2 (two) times daily. Start after finishing the namenda starter pack first 07/16/20   Garvin Fila, MD  metoprolol succinate (TOPROL-XL) 50 MG 24 hr tablet TAKE 1 TABLET BY MOUTH IN THE MORNING AND 1/2 TABLET IN THE EVENING 02/13/20   Dettinger, Fransisca Kaufmann, MD  Multiple Vitamin (MULTIVITAMIN WITH MINERALS) TABS Take 1 tablet by mouth daily.    [provider]  multivitamin-lutein (OCUVITE-LUTEIN) CAPS capsule Take 1 capsule by mouth daily.    [provider]  nitroGLYCERIN (NITROSTAT) 0.4 MG SL tablet Place 1 tablet (0.4 mg total) under the tongue every 5 (five) minutes as needed for chest pain (MAX 3 TABLETS). 02/11/18   Dettinger, Fransisca Kaufmann, MD  omeprazole (PRILOSEC) 20 MG capsule Take 1 capsule (20 mg  total) by mouth 2 (two) times daily. 02/13/20   Dettinger, Fransisca Kaufmann, MD  ondansetron (ZOFRAN) 4 MG tablet TAKE 1 TABLET BY MOUTH THREE TIMES DAILY AS NEEDED FOR NAUSEA AND VOMITING 08/29/20   Mannam, Praveen, MD  ondansetron (ZOFRAN-ODT) 4 MG disintegrating tablet DISSOLVE 1 TABLET IN MOUTH EVERY 8 HOURS AS NEEDED FOR NAUSEA OR VOMITING 12/22/19   Dettinger, Fransisca Kaufmann, MD  Pirfenidone (ESBRIET) 267 MG TABS TAKE 2 TABLETS (534 MG TOTAL) BY MOUTH IN THE MORNING, AT NOON, AND AT BEDTIME. 09/10/20 09/10/21  Mannam, Hart Robinsons, MD  potassium chloride SA (KLOR-CON M20) 20 MEQ tablet TAKE ONE TABLET BY MOUTH ONCE DAILY WHEN  YOU  TAKE LASIX 08/12/17   Dettinger, Fransisca Kaufmann, MD  Simethicone 125 MG CAPS Take by mouth as directed. Reported on 10/10/2015 one after each meal    [provider]  simvastatin (ZOCOR) 20 MG tablet Take 1 tablet (20 mg total) by mouth at bedtime. 02/13/20   Dettinger, Fransisca Kaufmann, MD  vitamin C (ASCORBIC ACID) 500 MG tablet Take 500 mg by mouth daily.    [provider]    Allergies    Patient has no known allergies.  Review of Systems   Review of Systems  Constitutional:  Positive for appetite change.  Neurological:  Positive for dizziness.  All other systems reviewed and are negative.  Physical Exam Updated Vital Signs BP (!) 177/85 (BP Location: Right Arm)   Pulse (!) 59   Temp 97.7 F (36.5 C) (Oral)   Resp 16   Ht 5' 10.5" (1.791 m)   Wt 70.3 kg   SpO2 98%   BMI 21.93 kg/m   Physical Exam Vitals and nursing note reviewed.  Constitutional:      General: He is not in acute distress.    Appearance: Normal appearance.     Comments: nontoxic  HENT:     Head: Normocephalic and atraumatic.  Eyes:     Conjunctiva/sclera: Conjunctivae normal.     Pupils: Pupils are equal,  round, and reactive to light.  Cardiovascular:     Rate and Rhythm: Normal rate and regular rhythm.     Pulses: Normal pulses.  Pulmonary:     Effort: Pulmonary effort is normal. No  respiratory distress.     Breath sounds: Normal breath sounds. No wheezing.     Comments: Speaking in full sentences.  Clear lung sounds in all fields. Abdominal:     General: There is no distension.     Palpations: Abdomen is soft. There is no mass.     Tenderness: There is no abdominal tenderness. There is no guarding or rebound.  Musculoskeletal:        General: Normal range of motion.     Cervical back: Normal range of motion and neck supple.  Skin:    General: Skin is warm and dry.     Capillary Refill: Capillary refill takes less than 2 seconds.  Neurological:     General: No focal deficit present.     Mental Status: He is alert and oriented to person, place, and time.     GCS: GCS eye subscore is 4. GCS verbal subscore is 5. GCS motor subscore is 6.     Cranial Nerves: Cranial nerves are intact.     Sensory: Sensation is intact.     Motor: Motor function is intact.     Comments: No neurodeficits.  CN intact.  Nose to finger intact.  Strength and sensation intact x4  Psychiatric:        Mood and Affect: Mood and affect normal.        Speech: Speech normal.        Behavior: Behavior normal.    ED Results / Procedures / Treatments   Labs (all labs ordered are listed, but only abnormal results are displayed) Labs Reviewed  BASIC METABOLIC PANEL - Abnormal; Notable for the following components:      Result Value   Creatinine, Ser 1.66 (*)    GFR, Estimated 42 (*)    All other components within normal limits  CBC - Abnormal; Notable for the following components:   RBC 4.10 (*)    Platelets 144 (*)    All other components within normal limits  URINALYSIS, ROUTINE W REFLEX MICROSCOPIC - Abnormal; Notable for the following components:   Color, Urine STRAW (*)    All other components within normal limits  HEPATIC FUNCTION PANEL  MAGNESIUM  CBG MONITORING, ED  CBG MONITORING, ED    EKG EKG Interpretation  Date/Time:  Monday October 22 2020 13:58:31 EDT Ventricular Rate:   60 PR Interval:  158 QRS Duration: 94 QT Interval:  448 QTC Calculation: 448 R Axis:   -3 Text Interpretation: Normal sinus rhythm Nonspecific T wave abnormality Abnormal ECG Confirmed by Quintella Reichert [phone]) on 10/22/2020 8:58:46 PM  Radiology CT ABDOMEN PELVIS WO CONTRAST  Result Date: 10/22/2020 CLINICAL DATA:  80 year old male with abdominal pain. EXAM: CT ABDOMEN AND PELVIS WITHOUT CONTRAST TECHNIQUE: Multidetector CT imaging of the abdomen and pelvis was performed following the standard protocol without IV contrast. COMPARISON:  CT abdomen pelvis dated 04/26/2017. FINDINGS: Evaluation of this exam is limited in the absence of intravenous contrast. Lower chest: Bibasilar subpleural reticulation and honeycombing, likely interstitial lung disease. There is multi vessel coronary vascular calcification. No intra-abdominal free air or free fluid. Hepatobiliary: The liver is unremarkable. No intrahepatic biliary ductal dilatation. The gallbladder is unremarkable. Pancreas: Unremarkable. No pancreatic ductal dilatation or surrounding inflammatory changes. Spleen: Normal in size  without focal abnormality. Adrenals/Urinary Tract: The adrenal unremarkable. Several nonobstructing left renal calculi measure up to 8 mm in the interpolar left kidney. A 2 mm stone is also noted in the interpolar right kidney. There is no hydronephrosis on either side. The visualized ureters and urinary bladder appear unremarkable. Stomach/Bowel: There is a small hiatal hernia. There is no bowel obstruction or active inflammation. The appendix is normal. Vascular/Lymphatic: Moderate aortoiliac atherosclerotic disease. The IVC is unremarkable. No portal venous gas. There is no adenopathy. Reproductive: The prostate and seminal vesicles are grossly unremarkable. No pelvic mass. Other: Anterior pelvic hernia repair mesh. Partially calcified 2.0 x 2.5 cm ovoid lesion in the left upper abdomen likely sequela prior fat necrosis. This  was present on 2018. Musculoskeletal: Osteopenia with degenerative changes of the spine. Old L1 compression fracture with anterior wedging. No acute osseous pathology. IMPRESSION: 1. No acute intra-abdominal or pelvic pathology. No bowel obstruction. Normal appendix. 2. Nonobstructing bilateral renal calculi. No hydronephrosis. 3. Aortic Atherosclerosis (ICD10-I70.0). Electronically Signed   By: Anner Crete M.D.   On: 10/22/2020 22:28   DG Chest 2 View  Result Date: 10/22/2020 CLINICAL DATA:  Weakness, hypotension EXAM: CHEST - 2 VIEW COMPARISON:  HRCT 11/16/2019, radiograph 05/20/2019 FINDINGS: Chronic hyperinflation with some coarsened bronchitic and interstitial changes as well as more diffuse subpleural predominant reticular opacities most evident towards the right lung base. No new consolidative opacity is clearly evident. No radiographic evidence of pulmonary edema. Vascularity is normally distributed. Prior sternotomy and postsurgical changes from CABG. Stable cardiomediastinal contours with a calcified aorta. The osseous structures appear diffusely demineralized which may limit detection of small or nondisplaced fractures. No acute osseous abnormality or suspicious osseous lesion. Degenerative changes are present in the imaged spine and shoulders. Chronic chest wall deformity is stable. Remaining soft tissues are unremarkable. IMPRESSION: No acute cardiopulmonary abnormality. Chronic hyperinflation coarsened bronchitic changes. 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 Prior sternotomy and CABG. Aortic Atherosclerosis (ICD10-I70.0). Electronically Signed   By: Lovena Le M.D.   On: 10/22/2020 15:49    Procedures Procedures   Medications Ordered in ED Medications  sodium chloride 0.9 % bolus 500 mL (0 mLs Intravenous Stopped 10/22/20 2310)    ED Course  I have reviewed the triage vital signs and the nursing notes.  Pertinent labs &  imaging results that were available during my care of the patient were reviewed by me and considered in my medical decision making (see chart for details).    MDM Rules/Calculators/A&P                          Patient presented for evaluation of intermittent lightheadedness/dizziness and weakness.  This has been going on for several years.  Today patient had a low blood pressure, prompting ER evaluation.  On my evaluation, patient is hypertensive with a blood pressure of close to 200.  He has no acute neurologic deficits.  Per chart review and per history, this appears to be a chronic issue.  Labs obtained from triage interpreted by me, overall reassuring.  No anemia.  Leukocytes normal.  Electrolytes stable.  Urine is normal.  Chest x-ray without signs of infection.  EKG nonischemic.  Patient did have positive orthostatics, though per chart review this is not new.  Will give fluids, encourage p.o. and reassess.  Case discussed with attending, Dr. Ayesha Rumpf evaluated the patient.  Added on abdominal imaging to ensure no AAA as patient did  report back pain, although on further history this is chronic and has been going on for about 20 years.  CT imaging negative.  Patient is still orthostatic, however slightly improved.  Will have patient follow-up with primary care.  At this time, patient appears safe for discharge.  Return precautions given.  Patient states he understands and agrees to plan.   Final Clinical Impression(s) / ED Diagnoses Final diagnoses:  Orthostatic hypotension    Rx / DC Orders ED Discharge Orders     None        Franchot Heidelberg, PA-C 10/22/20 2327    Quintella Reichert, MD 10/24/20 2108

## 2020-10-23 ENCOUNTER — Other Ambulatory Visit (HOSPITAL_COMMUNITY): Payer: Self-pay

## 2020-10-25 ENCOUNTER — Other Ambulatory Visit (HOSPITAL_COMMUNITY): Payer: Self-pay

## 2020-10-26 ENCOUNTER — Other Ambulatory Visit: Payer: Self-pay | Admitting: Family Medicine

## 2020-10-26 DIAGNOSIS — E785 Hyperlipidemia, unspecified: Secondary | ICD-10-CM

## 2020-11-01 ENCOUNTER — Ambulatory Visit (INDEPENDENT_AMBULATORY_CARE_PROVIDER_SITE_OTHER): Payer: Medicare Other | Admitting: Nurse Practitioner

## 2020-11-01 ENCOUNTER — Other Ambulatory Visit: Payer: Self-pay

## 2020-11-01 ENCOUNTER — Encounter: Payer: Self-pay | Admitting: Nurse Practitioner

## 2020-11-01 VITALS — BP 103/61 | HR 62 | Temp 98.5°F | Resp 20 | Ht 70.5 in | Wt 148.0 lb

## 2020-11-01 DIAGNOSIS — R2681 Unsteadiness on feet: Secondary | ICD-10-CM

## 2020-11-01 DIAGNOSIS — R404 Transient alteration of awareness: Secondary | ICD-10-CM

## 2020-11-01 NOTE — Progress Notes (Signed)
   Subjective:    Patient ID: Isaac [name], male    DOB: 04/05/1941, 80 y.o.   MRN: [id number]  Chief Complaint: Altered Mental Status (Falling, weakness, not eating ( [facility] ER visit 2 weeks ago) )   HPI Patient is brought in by daughter and wife. He has been gradually having altered mental status. He was started on namenda 6 months ago and he has been going down hill since then his wife stopped giving it to him and he got better, but new refill came in and his wife started giving it to him again. He is very unstable on hi feet and is needed a scooter to get around. He has fallen 2x in the last several weeks. Went to the ED the last time he fail but nothing was broke.    Review of Systems  Musculoskeletal:  Positive for gait problem.  Neurological:  Positive for weakness.  Psychiatric/Behavioral:  Positive for hallucinations (normal for him).   All other systems reviewed and are negative.     Objective:   Physical Exam Vitals and nursing note reviewed.  Constitutional:      Appearance: Normal appearance.  Cardiovascular:     Rate and Rhythm: Normal rate and regular rhythm.     Heart sounds: Normal heart sounds.  Pulmonary:     Effort: Pulmonary effort is normal.     Breath sounds: Normal breath sounds.  Musculoskeletal:     Comments: gait slow and choppy. He is using a cane to walk. unsteady  Skin:    General: Skin is warm and dry.  Neurological:     General: No focal deficit present.     Mental Status: He is alert and oriented to person, place, and time.  Psychiatric:        Mood and Affect: Mood normal.        Behavior: Behavior normal.    BP 103/61   Pulse 62   Temp 98.5 F (36.9 C)   Resp 20   Ht 5' 10.5" (1.791 m)   Wt 148 lb (67.1 kg)   SpO2 94%   BMI 20.94 kg/m        Assessment & Plan:  Isaac [name] in today with chief complaint of Altered Mental Status (Falling, weakness, not eating ( [facility] ER visit 2 weeks ago) )   1. Unsteady gait Use cane as  needed Need to get paper work for scooter and make an appointment for [employer] evaluation. Fall prevention  2. Transient alteration of awareness Stop namenda Continue aricept    The above assessment and management plan was discussed with the patient. The patient verbalized understanding of and has agreed to the management plan. Patient is aware to call the clinic if symptoms persist or worsen. Patient is aware when to return to the clinic for a follow-up visit. Patient educated on when it is appropriate to go to the emergency department.   Mary-Margaret Hassell Done, FNP

## 2020-11-01 NOTE — Patient Instructions (Signed)

## 2020-11-05 ENCOUNTER — Telehealth: Payer: Self-pay | Admitting: Family Medicine

## 2020-11-05 ENCOUNTER — Other Ambulatory Visit: Payer: Self-pay | Admitting: Family Medicine

## 2020-11-05 NOTE — Telephone Encounter (Signed)
Appt has been scheduled for 8/17 at 2:10. Wife made aware.

## 2020-11-14 ENCOUNTER — Other Ambulatory Visit (HOSPITAL_COMMUNITY): Payer: Self-pay

## 2020-11-14 ENCOUNTER — Other Ambulatory Visit (HOSPITAL_COMMUNITY): Payer: Self-pay | Admitting: Nephrology

## 2020-11-14 ENCOUNTER — Other Ambulatory Visit: Payer: Self-pay

## 2020-11-14 ENCOUNTER — Ambulatory Visit (HOSPITAL_COMMUNITY)
Admission: RE | Admit: 2020-11-14 | Discharge: 2020-11-14 | Disposition: A | Discharge status: Home / Self Care | Payer: Medicare Other | Source: Ambulatory Visit | Attending: Nephrology | Admitting: Nephrology

## 2020-11-14 DIAGNOSIS — I129 Hypertensive chronic kidney disease with stage 1 through stage 4 chronic kidney disease, or unspecified chronic kidney disease: Secondary | ICD-10-CM | POA: Diagnosis not present

## 2020-11-14 DIAGNOSIS — R059 Cough, unspecified: Secondary | ICD-10-CM | POA: Diagnosis not present

## 2020-11-14 DIAGNOSIS — J439 Emphysema, unspecified: Secondary | ICD-10-CM | POA: Diagnosis not present

## 2020-11-14 DIAGNOSIS — R0602 Shortness of breath: Secondary | ICD-10-CM

## 2020-11-14 DIAGNOSIS — I509 Heart failure, unspecified: Secondary | ICD-10-CM | POA: Diagnosis not present

## 2020-11-14 DIAGNOSIS — I2510 Atherosclerotic heart disease of native coronary artery without angina pectoris: Secondary | ICD-10-CM | POA: Diagnosis not present

## 2020-11-14 DIAGNOSIS — D638 Anemia in other chronic diseases classified elsewhere: Secondary | ICD-10-CM | POA: Diagnosis not present

## 2020-11-14 DIAGNOSIS — I10 Essential (primary) hypertension: Secondary | ICD-10-CM | POA: Diagnosis not present

## 2020-11-14 DIAGNOSIS — J9811 Atelectasis: Secondary | ICD-10-CM | POA: Diagnosis not present

## 2020-11-14 DIAGNOSIS — N1832 Chronic kidney disease, stage 3b: Secondary | ICD-10-CM | POA: Diagnosis not present

## 2020-11-14 DIAGNOSIS — I5032 Chronic diastolic (congestive) heart failure: Secondary | ICD-10-CM | POA: Diagnosis not present

## 2020-11-21 ENCOUNTER — Telehealth: Payer: Self-pay

## 2020-11-21 ENCOUNTER — Other Ambulatory Visit (HOSPITAL_COMMUNITY): Payer: Self-pay

## 2020-11-21 NOTE — Telephone Encounter (Signed)
Received notification from call center stating that patient is requiring additional copay assistance. Patient has been historically using a copay card through a grant foundation which expired on 10/28/20. Reached out to [place] to inquire about a possible one-time extension as his remaining card balance was around $700. Rep informed me that once expiration date is reached, the card and all remaining funds are terminated. Will need to investigate potential open IPF grants, or apply for assistance with [place].

## 2020-11-21 NOTE — Telephone Encounter (Signed)
No PF grants currently open. Provider form for [facility] patient assistance placed in Dr. Matilde Bash mailbox.  ATC patient to review how many days supply he has remaining and patient assistance process, but unable to reach. Left VM on home phone and mobile phone requesting return call. Will mail patient his portion of application today to complete and return to clinic. The [place] application we have on file from August 2021 is now outdated.  We can provide Esbriet sample to patient if he desires.  Notified [facility] to place rx on hold for now.  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 Sent staff message to scheduling team to assist.  Knox Saliva, PharmD, MPH, BCPS Clinical Pharmacist (Rheumatology and Pulmonology)

## 2020-11-22 NOTE — Telephone Encounter (Signed)
ATC patient to discuss Esbriet patient assistance. Unable to reach. Home VM box not yet set up. Left VM with spouse's mobile number requesting return call to review. Will continue to f/u  Knox Saliva, PharmD, MPH, BCPS Clinical Pharmacist (Rheumatology and Pulmonology)

## 2020-11-26 ENCOUNTER — Other Ambulatory Visit: Payer: Self-pay

## 2020-11-26 ENCOUNTER — Emergency Department (HOSPITAL_COMMUNITY): Payer: Medicare Other

## 2020-11-26 ENCOUNTER — Encounter (HOSPITAL_COMMUNITY): Payer: Self-pay | Admitting: *Deleted

## 2020-11-26 ENCOUNTER — Inpatient Hospital Stay (HOSPITAL_COMMUNITY)
Admission: EM | Admit: 2020-11-26 | Discharge: 2020-12-04 | DRG: 069 | Disposition: A | Discharge status: Skilled Nursing Facility | Payer: Medicare Other | Attending: Internal Medicine | Admitting: Internal Medicine

## 2020-11-26 DIAGNOSIS — F0281 Dementia in other diseases classified elsewhere with behavioral disturbance: Secondary | ICD-10-CM | POA: Diagnosis present

## 2020-11-26 DIAGNOSIS — I080 Rheumatic disorders of both mitral and aortic valves: Secondary | ICD-10-CM | POA: Diagnosis not present

## 2020-11-26 DIAGNOSIS — R410 Disorientation, unspecified: Secondary | ICD-10-CM | POA: Diagnosis present

## 2020-11-26 DIAGNOSIS — R531 Weakness: Secondary | ICD-10-CM | POA: Diagnosis not present

## 2020-11-26 DIAGNOSIS — R079 Chest pain, unspecified: Secondary | ICD-10-CM | POA: Diagnosis not present

## 2020-11-26 DIAGNOSIS — R296 Repeated falls: Secondary | ICD-10-CM | POA: Diagnosis not present

## 2020-11-26 DIAGNOSIS — I130 Hypertensive heart and chronic kidney disease with heart failure and stage 1 through stage 4 chronic kidney disease, or unspecified chronic kidney disease: Secondary | ICD-10-CM | POA: Diagnosis present

## 2020-11-26 DIAGNOSIS — I10 Essential (primary) hypertension: Secondary | ICD-10-CM | POA: Diagnosis not present

## 2020-11-26 DIAGNOSIS — I951 Orthostatic hypotension: Secondary | ICD-10-CM | POA: Diagnosis not present

## 2020-11-26 DIAGNOSIS — Z8249 Family history of ischemic heart disease and other diseases of the circulatory system: Secondary | ICD-10-CM

## 2020-11-26 DIAGNOSIS — I252 Old myocardial infarction: Secondary | ICD-10-CM | POA: Diagnosis not present

## 2020-11-26 DIAGNOSIS — I2510 Atherosclerotic heart disease of native coronary artery without angina pectoris: Secondary | ICD-10-CM | POA: Diagnosis not present

## 2020-11-26 DIAGNOSIS — Z79899 Other long term (current) drug therapy: Secondary | ICD-10-CM

## 2020-11-26 DIAGNOSIS — R279 Unspecified lack of coordination: Secondary | ICD-10-CM | POA: Diagnosis not present

## 2020-11-26 DIAGNOSIS — J8410 Pulmonary fibrosis, unspecified: Secondary | ICD-10-CM | POA: Diagnosis present

## 2020-11-26 DIAGNOSIS — F05 Delirium due to known physiological condition: Secondary | ICD-10-CM | POA: Diagnosis present

## 2020-11-26 DIAGNOSIS — Z8616 Personal history of COVID-19: Secondary | ICD-10-CM | POA: Diagnosis not present

## 2020-11-26 DIAGNOSIS — Z951 Presence of aortocoronary bypass graft: Secondary | ICD-10-CM | POA: Diagnosis not present

## 2020-11-26 DIAGNOSIS — R42 Dizziness and giddiness: Secondary | ICD-10-CM | POA: Diagnosis not present

## 2020-11-26 DIAGNOSIS — Z20822 Contact with and (suspected) exposure to covid-19: Secondary | ICD-10-CM | POA: Diagnosis not present

## 2020-11-26 DIAGNOSIS — R404 Transient alteration of awareness: Secondary | ICD-10-CM | POA: Diagnosis not present

## 2020-11-26 DIAGNOSIS — Z833 Family history of diabetes mellitus: Secondary | ICD-10-CM

## 2020-11-26 DIAGNOSIS — D696 Thrombocytopenia, unspecified: Secondary | ICD-10-CM

## 2020-11-26 DIAGNOSIS — E538 Deficiency of other specified B group vitamins: Secondary | ICD-10-CM | POA: Diagnosis present

## 2020-11-26 DIAGNOSIS — R262 Difficulty in walking, not elsewhere classified: Secondary | ICD-10-CM | POA: Diagnosis present

## 2020-11-26 DIAGNOSIS — R55 Syncope and collapse: Secondary | ICD-10-CM | POA: Diagnosis not present

## 2020-11-26 DIAGNOSIS — R29702 NIHSS score 2: Secondary | ICD-10-CM | POA: Diagnosis present

## 2020-11-26 DIAGNOSIS — K219 Gastro-esophageal reflux disease without esophagitis: Secondary | ICD-10-CM | POA: Diagnosis present

## 2020-11-26 DIAGNOSIS — D631 Anemia in chronic kidney disease: Secondary | ICD-10-CM | POA: Diagnosis present

## 2020-11-26 DIAGNOSIS — Z66 Do not resuscitate: Secondary | ICD-10-CM | POA: Diagnosis present

## 2020-11-26 DIAGNOSIS — I517 Cardiomegaly: Secondary | ICD-10-CM | POA: Diagnosis not present

## 2020-11-26 DIAGNOSIS — I5032 Chronic diastolic (congestive) heart failure: Secondary | ICD-10-CM | POA: Diagnosis not present

## 2020-11-26 DIAGNOSIS — N1832 Chronic kidney disease, stage 3b: Secondary | ICD-10-CM | POA: Diagnosis not present

## 2020-11-26 DIAGNOSIS — Z825 Family history of asthma and other chronic lower respiratory diseases: Secondary | ICD-10-CM

## 2020-11-26 DIAGNOSIS — Z9842 Cataract extraction status, left eye: Secondary | ICD-10-CM

## 2020-11-26 DIAGNOSIS — H538 Other visual disturbances: Secondary | ICD-10-CM | POA: Diagnosis present

## 2020-11-26 DIAGNOSIS — R1312 Dysphagia, oropharyngeal phase: Secondary | ICD-10-CM | POA: Diagnosis not present

## 2020-11-26 DIAGNOSIS — Z87891 Personal history of nicotine dependence: Secondary | ICD-10-CM

## 2020-11-26 DIAGNOSIS — I110 Hypertensive heart disease with heart failure: Secondary | ICD-10-CM

## 2020-11-26 DIAGNOSIS — Z9841 Cataract extraction status, right eye: Secondary | ICD-10-CM

## 2020-11-26 DIAGNOSIS — E785 Hyperlipidemia, unspecified: Secondary | ICD-10-CM | POA: Diagnosis present

## 2020-11-26 DIAGNOSIS — R9401 Abnormal electroencephalogram [EEG]: Secondary | ICD-10-CM | POA: Diagnosis present

## 2020-11-26 DIAGNOSIS — Z961 Presence of intraocular lens: Secondary | ICD-10-CM | POA: Diagnosis present

## 2020-11-26 DIAGNOSIS — G459 Transient cerebral ischemic attack, unspecified: Secondary | ICD-10-CM | POA: Diagnosis not present

## 2020-11-26 DIAGNOSIS — Z7401 Bed confinement status: Secondary | ICD-10-CM | POA: Diagnosis not present

## 2020-11-26 DIAGNOSIS — G309 Alzheimer's disease, unspecified: Secondary | ICD-10-CM | POA: Diagnosis present

## 2020-11-26 DIAGNOSIS — Z743 Need for continuous supervision: Secondary | ICD-10-CM | POA: Diagnosis not present

## 2020-11-26 DIAGNOSIS — N183 Chronic kidney disease, stage 3 unspecified: Secondary | ICD-10-CM

## 2020-11-26 DIAGNOSIS — Z803 Family history of malignant neoplasm of breast: Secondary | ICD-10-CM

## 2020-11-26 DIAGNOSIS — Z85828 Personal history of other malignant neoplasm of skin: Secondary | ICD-10-CM

## 2020-11-26 DIAGNOSIS — Z7951 Long term (current) use of inhaled steroids: Secondary | ICD-10-CM

## 2020-11-26 DIAGNOSIS — R471 Dysarthria and anarthria: Secondary | ICD-10-CM | POA: Diagnosis not present

## 2020-11-26 DIAGNOSIS — M6281 Muscle weakness (generalized): Secondary | ICD-10-CM | POA: Diagnosis not present

## 2020-11-26 DIAGNOSIS — R2689 Other abnormalities of gait and mobility: Secondary | ICD-10-CM | POA: Diagnosis not present

## 2020-11-26 DIAGNOSIS — Z7982 Long term (current) use of aspirin: Secondary | ICD-10-CM

## 2020-11-26 DIAGNOSIS — Z8673 Personal history of transient ischemic attack (TIA), and cerebral infarction without residual deficits: Secondary | ICD-10-CM | POA: Diagnosis present

## 2020-11-26 DIAGNOSIS — N400 Enlarged prostate without lower urinary tract symptoms: Secondary | ICD-10-CM | POA: Diagnosis present

## 2020-11-26 DIAGNOSIS — H9190 Unspecified hearing loss, unspecified ear: Secondary | ICD-10-CM | POA: Diagnosis present

## 2020-11-26 LAB — CBC WITH DIFFERENTIAL/PLATELET
Abs Immature Granulocytes: 0.04 10*3/uL (ref 0.00–0.07)
Basophils Absolute: 0.1 10*3/uL (ref 0.0–0.1)
Basophils Relative: 1 %
Eosinophils Absolute: 0.1 10*3/uL (ref 0.0–0.5)
Eosinophils Relative: 2 %
HCT: 37.9 % — ABNORMAL LOW (ref 39.0–52.0)
Hemoglobin: 12.9 g/dL — ABNORMAL LOW (ref 13.0–17.0)
Immature Granulocytes: 1 %
Lymphocytes Relative: 23 %
Lymphs Abs: 1.8 10*3/uL (ref 0.7–4.0)
MCH: 33.3 pg (ref 26.0–34.0)
MCHC: 34 g/dL (ref 30.0–36.0)
MCV: 97.9 fL (ref 80.0–100.0)
Monocytes Absolute: 0.8 10*3/uL (ref 0.1–1.0)
Monocytes Relative: 10 %
Neutro Abs: 4.9 10*3/uL (ref 1.7–7.7)
Neutrophils Relative %: 63 %
Platelets: 147 10*3/uL — ABNORMAL LOW (ref 150–400)
RBC: 3.87 MIL/uL — ABNORMAL LOW (ref 4.22–5.81)
RDW: 13.2 % (ref 11.5–15.5)
WBC: 7.7 10*3/uL (ref 4.0–10.5)
nRBC: 0 % (ref 0.0–0.2)

## 2020-11-26 LAB — COMPREHENSIVE METABOLIC PANEL
ALT: 10 U/L (ref 0–44)
AST: 15 U/L (ref 15–41)
Albumin: 3.9 g/dL (ref 3.5–5.0)
Alkaline Phosphatase: 67 U/L (ref 38–126)
Anion gap: 6 (ref 5–15)
BUN: 26 mg/dL — ABNORMAL HIGH (ref 8–23)
CO2: 25 mmol/L (ref 22–32)
Calcium: 8.7 mg/dL — ABNORMAL LOW (ref 8.9–10.3)
Chloride: 106 mmol/L (ref 98–111)
Creatinine, Ser: 1.69 mg/dL — ABNORMAL HIGH (ref 0.61–1.24)
GFR, Estimated: 41 mL/min — ABNORMAL LOW (ref >60)
Glucose, Bld: 97 mg/dL (ref 70–99)
Potassium: 4.1 mmol/L (ref 3.5–5.1)
Sodium: 137 mmol/L (ref 135–145)
Total Bilirubin: 0.5 mg/dL (ref 0.3–1.2)
Total Protein: 7.3 g/dL (ref 6.5–8.1)

## 2020-11-26 LAB — URINALYSIS, ROUTINE W REFLEX MICROSCOPIC
Bilirubin Urine: NEGATIVE
Glucose, UA: NEGATIVE mg/dL
Hgb urine dipstick: NEGATIVE
Ketones, ur: NEGATIVE mg/dL
Leukocytes,Ua: NEGATIVE
Nitrite: NEGATIVE
Protein, ur: NEGATIVE mg/dL
Specific Gravity, Urine: 1.009 (ref 1.005–1.030)
pH: 6 (ref 5.0–8.0)

## 2020-11-26 LAB — RESP PANEL BY RT-PCR (FLU A&B, COVID) ARPGX2
Influenza A by PCR: NEGATIVE
Influenza B by PCR: NEGATIVE
SARS Coronavirus 2 by RT PCR: NEGATIVE

## 2020-11-26 LAB — TROPONIN I (HIGH SENSITIVITY)
Troponin I (High Sensitivity): 9 ng/L (ref <18)
Troponin I (High Sensitivity): 9 ng/L (ref <18)

## 2020-11-26 LAB — TSH: TSH: 1.72 u[IU]/mL (ref 0.350–4.500)

## 2020-11-26 MED ORDER — SODIUM CHLORIDE 0.9 % IV BOLUS
500.0000 mL | Freq: Once | INTRAVENOUS | Status: AC
Start: 2020-11-26 — End: 2020-11-26
  Administered 2020-11-26: 500 mL via INTRAVENOUS

## 2020-11-26 NOTE — ED Provider Notes (Signed)
Emergency Department Provider Note   I have reviewed the triage vital signs and the nursing notes.   HISTORY  Chief Complaint Weakness   HPI Isaac [name] is a 80 y.o. male with past medical history reviewed below including CAD and prior history of COVID presents to the ED with complaint of episodes where he feels lightheaded, developed slurred speech, and developed some repetitive movements.  No known history of seizure.  The history is partially provided by the patient's wife at bedside.  She tells me that over the last several weeks he has had 1 to sometimes 2 episodes in the day where he will be standing and suddenly feel lightheaded.  She states that his right foot will begin to tap on the ground.  She interprets this is feeling unstable and helps him sit down.  He tries to talk but hasslurred speech according to her.  These episodes last for approximately 20 minutes and then resolve. No fever. No falls or head injury. No syncope. They do see [facility] but have not been since symptoms develop and wife tells me that he is seen there for memory issues. The patient's wife has been keeping notes when these episodes occur. He had two episodes today which prompted ED visit.    Past Medical History:  Diagnosis Date   Allergic rhinitis    Allergy    Arthritis 06/04/2014   Trial of sulindac 05/30/14 since cri risk with other nsaids    Barrett's esophagus 10/27/2011   BPH (benign prostatic hyperplasia) 02/20/2014   CAD (coronary artery disease) of artery bypass graft, Occluded VG-non dominant LCX medical therapy 11/25/2013   CAD (coronary artery disease), native coronary artery - LIMA-LAD, SVG-Diag, SVG-RCA 2002    Catheterization 2002 showing occluded LAD, 99% diagonal, occluded distal circumflex, 90% nondominant right coronary artery  CABG 08/06/00 by Dr. Nils Pyle with LIMA to LAD, SVG to diagonal, SVG to right coronary artery, circumflex was noted to be too small to graft     CAP  (community acquired pneumonia) 04/16/2016   See cxr 04/15/16 > treated with 7 days of Levaquin 500 mg daily with resolution radiographically and clinically.   Cataract    Cerumen impaction 06/05/2015   CHF (congestive heart failure), NYHA class III ([place]) 02/20/2014   Dyspnea    with exertion    Dyspnea on exertion 11/08/2013   Followed as Primary Care Patient/ [facility]/ [name]  - new onset early June 2015  - 11/08/2013  Walked RA x 3 laps @ 185 ft each stopped due to  End of study, mild sob, no cp and no desat, EKG ok  - Cardiac w/u inconclusive but not clearly having ischemia  11/24/13   - 09/11/2016  Walked RA x 3 laps @ 185 ft each stopped due to  End of study, nl pace, no sob or desat   - Spirometry 09/11/2016     Essential hypertension    Referred to [facility] 12/12/2016    GERD (gastroesophageal reflux disease)    H/O hiatal hernia    History of skin cancer    bilateral arms with removal   Hyperlipidemia    Hyperlipidemia LDL goal <70    Followed as Primary Care Patient/ [facility]/ [name]     - Target LDL < 70 as has IHD     Hypertension    Hypertensive heart disease    Followed as Primary Care Patient/ [facility]/ [name]      Ischemic  heart disease    Left femoral hernia s/p laparoscopic repair 05/28/2017 05/28/2017   Lumbar disc disease    Myocardial infarction [facility]) 2002   Right inguinal hernia s/p laparoscopic repair 05/28/2017 05/28/2017   Stage 3 chronic kidney disease ([place]) 05/22/2015   trial off lasix/ppi/clinoril / [place] and renal eval 05/22/2015 >>>     T wave inversion in EKG 02/20/2014   Unstable angina ([facility]) 11/23/2013    Patient Active Problem List   Diagnosis Date Noted   Hallucination 09/27/2019   Right inguinal hernia s/p laparoscopic repair 05/28/2017 05/28/2017   Left femoral hernia s/p laparoscopic repair 05/28/2017 05/28/2017   Cerumen impaction 06/05/2015   Stage 3 chronic kidney disease ([place]) 05/22/2015   Arthritis 06/04/2014   CHF  (congestive heart failure), NYHA class III ([place]) 02/20/2014   BPH (benign prostatic hyperplasia) 02/20/2014   T wave inversion in EKG 02/20/2014   Coronary artery disease 11/25/2013   Essential hypertension    Unstable angina ([place]) 11/23/2013   Dyspnea on exertion 11/08/2013   Barrett's esophagus 10/27/2011   GERD (gastroesophageal reflux disease) 07/14/2011   CAD (coronary artery disease), native coronary artery - LIMA-LAD, SVG-Diag, SVG-RCA 2002    Hyperlipidemia LDL goal <70    Hypertensive heart disease     Past Surgical History:  Procedure Laterality Date   COLONOSCOPY     CORONARY ARTERY BYPASS GRAFT  2002   LIMA-LAD, SVG-Diag, SVG-RCA   EYE SURGERY Bilateral 1995, 2000   ioc for cataracts   GROIN DISSECTION  01/23/2012   Procedure: Virl Son EXPLORATION;  Surgeon: Harl Bowie, MD;  Location: [facility];  Service: General;  Laterality: Left;  Left Inguinal Exploration, release of scar tissue, Placement of Mesh   INGUINAL HERNIA REPAIR Left 1983   INGUINAL HERNIA REPAIR Bilateral 05/28/2017   Procedure: LAPAROSCOPIC RIGHT AND LEFT INGUINAL HERNIA REPAIR WITH MESH;  Surgeon: Michael Boston, MD;  Location: [facility];  Service: General;  Laterality: Bilateral;   INSERTION OF MESH Bilateral 05/28/2017   Procedure: INSERTION OF MESH;  Surgeon: Michael Boston, MD;  Location: [facility];  Service: General;  Laterality: Bilateral;   LEFT HEART CATHETERIZATION WITH CORONARY ANGIOGRAM N/A 11/24/2013   Procedure: LEFT HEART CATHETERIZATION WITH CORONARY ANGIOGRAM;  Surgeon: Peter M Martinique, MD;  Location[facility] CATH LAB;  Service: Cardiovascular;  Laterality: N/A;   NOSE SURGERY  1985    Allergies Patient has no known allergies.  Family History  Problem Relation Age of Onset   Breast cancer Mother    Diabetes Mother    Hypertension Father    Dementia Father 42       early 43s   Diabetes Sister    COPD Brother        smoker   Colon cancer Neg Hx     Esophageal cancer Neg Hx    Rectal cancer Neg Hx    Stomach cancer Neg Hx     Social History Social History   Tobacco Use   Smoking status: Never   Smokeless tobacco: Former    Types: Chew    Quit date: 04/28/2014  Vaping Use   Vaping Use: Never used  Substance Use Topics   Alcohol use: No    Alcohol/week: 0.0 standard drinks   Drug use: No    Review of Systems  Constitutional: No fever/chills Eyes: No visual changes. ENT: No sore throat. Cardiovascular: Positive chest pain today. Not currently.  Respiratory: Denies shortness of breath. Gastrointestinal: No abdominal pain.  No nausea, no vomiting.  No diarrhea.  No constipation. Genitourinary: Negative for dysuria. Musculoskeletal: Negative for back pain. Skin: Negative for rash. Neurological: Negative for headaches, focal weakness or numbness. Episodic slurred speech and gait instability.   10-point ROS otherwise negative.  ____________________________________________   PHYSICAL EXAM:  VITAL SIGNS: ED Triage Vitals [11/26/20 1643]  Enc Vitals Group     BP (!) 91/58     Pulse Rate 69     Resp 18     Temp (!) 97.3 F (36.3 C)     Temp Source Oral     SpO2 100 %   Constitutional: Alert and oriented. Well appearing and in no acute distress. Eyes: Conjunctivae are normal. PERRL. EOMI. Head: Atraumatic. Nose: No congestion/rhinnorhea. Mouth/Throat: Mucous membranes are moist.   Neck: No stridor.  Cardiovascular: Normal rate, regular rhythm. Good peripheral circulation. Grossly normal heart sounds.   Respiratory: Normal respiratory effort.  No retractions. Lungs CTAB. Gastrointestinal: Soft and nontender. No distention.  Musculoskeletal: No lower extremity tenderness nor edema. No gross deformities of extremities. Neurologic:  Normal speech and language. No gross focal neurologic deficits are appreciated. No tremor. 5/5 strength in the bilateral upper/lower extremities. No sensory deficit.  Skin:  Skin is warm,  dry and intact. No rash noted.   ____________________________________________   LABS (all labs ordered are listed, but only abnormal results are displayed)  Labs Reviewed  COMPREHENSIVE METABOLIC PANEL - Abnormal; Notable for the following components:      Result Value   BUN 26 (*)    Creatinine, Ser 1.69 (*)    Calcium 8.7 (*)    GFR, Estimated 41 (*)    All other components within normal limits  CBC WITH DIFFERENTIAL/PLATELET - Abnormal; Notable for the following components:   RBC 3.87 (*)    Hemoglobin 12.9 (*)    HCT 37.9 (*)    Platelets 147 (*)    All other components within normal limits  RESP PANEL BY RT-PCR (FLU A&B, COVID) ARPGX2  TSH  URINALYSIS, ROUTINE W REFLEX MICROSCOPIC  TROPONIN I (HIGH SENSITIVITY)  TROPONIN I (HIGH SENSITIVITY)   ____________________________________________  EKG   EKG Interpretation  Date/Time:  Monday November 26 2020 17:24:19 EDT Ventricular Rate:  60 PR Interval:  155 QRS Duration: 100 QT Interval:  429 QTC Calculation: 429 R Axis:   15 Text Interpretation: Sinus rhythm Borderline repolarization abnormality Confirmed by Nanda Quinton [phone]) on 11/26/2020 5:28:20 PM        ____________________________________________  RADIOLOGY  CT Head Wo Contrast  Result Date: 11/26/2020 CLINICAL DATA:  Mental status change, unknown cause Patient reports dizziness with multiple falls. EXAM: CT HEAD WITHOUT CONTRAST TECHNIQUE: Contiguous axial images were obtained from the base of the skull through the vertex without intravenous contrast. COMPARISON:  Head CT 02/18/2020 FINDINGS: Brain: No evidence of acute infarction, hemorrhage, hydrocephalus, extra-axial collection or mass lesion/mass effect. Stable brain volume and mild periventricular chronic small vessel ischemia. 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 Vascular: Atherosclerosis of skullbase vasculature without hyperdense vessel or abnormal calcification. Skull: No  fracture or focal lesion. Sinuses/Orbits: Paranasal sinuses and mastoid air cells are clear. The visualized orbits are unremarkable. Bilateral cataract resection. Other: None. IMPRESSION: 1. No acute intracranial abnormality. 2. Stable chronic small vessel ischemia. 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 Electronically Signed   By: Keith Rake M.D.   On: 11/26/2020 18:25   DG Chest Portable 1 View  Result Date: 11/26/2020 CLINICAL DATA:  Chest pain, near syncope  EXAM: PORTABLE CHEST 1 VIEW COMPARISON:  11/14/2020 FINDINGS: Rotated AP portable examination with scarring and/or fibrosis at the bilateral lung bases. No acute appearing airspace opacity. Cardiomegaly status post median sternotomy and CABG. IMPRESSION: Rotated AP portable examination with scarring and/or fibrosis at the bilateral lung bases. No acute appearing airspace opacity. Electronically Signed   By: Eddie Candle M.D.   On: 11/26/2020 18:00    ____________________________________________   PROCEDURES  Procedure(s) performed:   Procedures  None ____________________________________________   INITIAL IMPRESSION / ASSESSMENT AND PLAN / ED COURSE  Pertinent labs & imaging results that were available during my care of the patient were reviewed by me and considered in my medical decision making (see chart for details).   Patient presents to the emergency department with episodes where he feels lightheaded, has some leg instability, some slurred speech.  The patient's wife gives a fairly good description of these episodes and does have them written down in a notebook at bedside.  Is hard to tell if these are presyncopal with development of hypotension causing the speech disturbance or if these could be partial seizures.  Patient has no lasting neurodeficits from these episodes where I would suspect stroke.  In the review of systems portion of the encounter the patient did describe some chest discomfort earlier today but  none currently.  His EKG shows no significant ST changes but will obtain a troponin along with chest x-ray for further evaluation.  Labs and CT imaging largely unremarkable. Awaiting Neuro consultation. Called to check on status. Patient disposition pending their evaluation. Care transferred to Dr. Dina Rich to f/u after Neuro evaluation.  ____________________________________________  FINAL CLINICAL IMPRESSION(S) / ED DIAGNOSES  Final diagnoses:  Episodic weakness     MEDICATIONS GIVEN DURING THIS VISIT:  Medications  sodium chloride 0.9 % bolus 500 mL (0 mLs Intravenous Stopped 11/26/20 1900)     Note:  This document was prepared using Dragon voice recognition software and may include unintentional dictation errors.  Nanda Quinton, MD, [facility] Emergency Medicine    Ajanay Farve, [facility], MD 11/27/20 [phone]

## 2020-11-26 NOTE — ED Triage Notes (Signed)
Episodes of weakness in both legs onset 11/06/2020. States it is getting worse.

## 2020-11-27 ENCOUNTER — Observation Stay (HOSPITAL_COMMUNITY): Payer: Medicare Other

## 2020-11-27 ENCOUNTER — Observation Stay (HOSPITAL_BASED_OUTPATIENT_CLINIC_OR_DEPARTMENT_OTHER): Payer: Medicare Other

## 2020-11-27 ENCOUNTER — Encounter (HOSPITAL_COMMUNITY): Payer: Self-pay | Admitting: Internal Medicine

## 2020-11-27 ENCOUNTER — Observation Stay (HOSPITAL_COMMUNITY)
Admit: 2020-11-27 | Discharge: 2020-11-27 | Disposition: A | Discharge status: Home / Self Care | Payer: Medicare Other | Attending: Internal Medicine | Admitting: Internal Medicine

## 2020-11-27 DIAGNOSIS — E785 Hyperlipidemia, unspecified: Secondary | ICD-10-CM

## 2020-11-27 DIAGNOSIS — R471 Dysarthria and anarthria: Secondary | ICD-10-CM | POA: Diagnosis not present

## 2020-11-27 DIAGNOSIS — R42 Dizziness and giddiness: Secondary | ICD-10-CM | POA: Diagnosis not present

## 2020-11-27 DIAGNOSIS — G459 Transient cerebral ischemic attack, unspecified: Secondary | ICD-10-CM | POA: Diagnosis not present

## 2020-11-27 DIAGNOSIS — D696 Thrombocytopenia, unspecified: Secondary | ICD-10-CM | POA: Diagnosis not present

## 2020-11-27 DIAGNOSIS — N183 Chronic kidney disease, stage 3 unspecified: Secondary | ICD-10-CM

## 2020-11-27 DIAGNOSIS — N1832 Chronic kidney disease, stage 3b: Secondary | ICD-10-CM

## 2020-11-27 DIAGNOSIS — K219 Gastro-esophageal reflux disease without esophagitis: Secondary | ICD-10-CM

## 2020-11-27 DIAGNOSIS — I10 Essential (primary) hypertension: Secondary | ICD-10-CM | POA: Diagnosis not present

## 2020-11-27 DIAGNOSIS — I951 Orthostatic hypotension: Secondary | ICD-10-CM

## 2020-11-27 DIAGNOSIS — R531 Weakness: Secondary | ICD-10-CM | POA: Diagnosis not present

## 2020-11-27 DIAGNOSIS — Z8673 Personal history of transient ischemic attack (TIA), and cerebral infarction without residual deficits: Secondary | ICD-10-CM | POA: Diagnosis present

## 2020-11-27 LAB — VITAMIN B12: Vitamin B-12: 159 pg/mL — ABNORMAL LOW (ref 180–914)

## 2020-11-27 LAB — COMPREHENSIVE METABOLIC PANEL
ALT: 10 U/L (ref 0–44)
AST: 16 U/L (ref 15–41)
Albumin: 3.7 g/dL (ref 3.5–5.0)
Alkaline Phosphatase: 67 U/L (ref 38–126)
Anion gap: 6 (ref 5–15)
BUN: 24 mg/dL — ABNORMAL HIGH (ref 8–23)
CO2: 25 mmol/L (ref 22–32)
Calcium: 9 mg/dL (ref 8.9–10.3)
Chloride: 108 mmol/L (ref 98–111)
Creatinine, Ser: 1.5 mg/dL — ABNORMAL HIGH (ref 0.61–1.24)
GFR, Estimated: 47 mL/min — ABNORMAL LOW (ref >60)
Glucose, Bld: 139 mg/dL — ABNORMAL HIGH (ref 70–99)
Potassium: 3.7 mmol/L (ref 3.5–5.1)
Sodium: 139 mmol/L (ref 135–145)
Total Bilirubin: 0.6 mg/dL (ref 0.3–1.2)
Total Protein: 7.2 g/dL (ref 6.5–8.1)

## 2020-11-27 LAB — ECHOCARDIOGRAM COMPLETE
AR max vel: 2.62 cm2
AV Area VTI: 2.92 cm2
AV Area mean vel: 2.53 cm2
AV Mean grad: 2 mmHg
AV Peak grad: 4.4 mmHg
Ao pk vel: 1.05 m/s
Area-P 1/2: 3.15 cm2
Calc EF: 43.9 %
MV VTI: 4.32 cm2
P 1/2 time: 552 msec
S' Lateral: 2.86 cm
Single Plane A2C EF: 29.8 %
Single Plane A4C EF: 55.8 %

## 2020-11-27 LAB — APTT: aPTT: 28 seconds (ref 24–36)

## 2020-11-27 LAB — LIPID PANEL
Cholesterol: 151 mg/dL (ref 0–200)
HDL: 53 mg/dL (ref >40)
LDL Cholesterol: 75 mg/dL (ref 0–99)
Total CHOL/HDL Ratio: 2.8 RATIO
Triglycerides: 114 mg/dL (ref <150)
VLDL: 23 mg/dL (ref 0–40)

## 2020-11-27 LAB — PROTIME-INR
INR: 1.1 (ref 0.8–1.2)
Prothrombin Time: 14.4 seconds (ref 11.4–15.2)

## 2020-11-27 LAB — CBC
HCT: 38.7 % — ABNORMAL LOW (ref 39.0–52.0)
Hemoglobin: 12.9 g/dL — ABNORMAL LOW (ref 13.0–17.0)
MCH: 32.7 pg (ref 26.0–34.0)
MCHC: 33.3 g/dL (ref 30.0–36.0)
MCV: 98.2 fL (ref 80.0–100.0)
Platelets: 144 10*3/uL — ABNORMAL LOW (ref 150–400)
RBC: 3.94 MIL/uL — ABNORMAL LOW (ref 4.22–5.81)
RDW: 13 % (ref 11.5–15.5)
WBC: 8.6 10*3/uL (ref 4.0–10.5)
nRBC: 0 % (ref 0.0–0.2)

## 2020-11-27 LAB — MAGNESIUM: Magnesium: 1.9 mg/dL (ref 1.7–2.4)

## 2020-11-27 LAB — HEMOGLOBIN A1C
Hgb A1c MFr Bld: 5.4 % (ref 4.8–5.6)
Mean Plasma Glucose: 108.28 mg/dL

## 2020-11-27 LAB — PHOSPHORUS: Phosphorus: 2.6 mg/dL (ref 2.5–4.6)

## 2020-11-27 LAB — CK: Total CK: 38 U/L — ABNORMAL LOW (ref 49–397)

## 2020-11-27 LAB — FOLATE: Folate: 11.1 ng/mL (ref >5.9)

## 2020-11-27 MED ORDER — POTASSIUM CHLORIDE IN NACL 20-0.9 MEQ/L-% IV SOLN
INTRAVENOUS | Status: AC
  Filled 2020-11-27: qty 1000

## 2020-11-27 MED ORDER — LORAZEPAM 2 MG/ML IJ SOLN
0.5000 mg | Freq: Four times a day (QID) | INTRAMUSCULAR | Status: DC | PRN
  Administered 2020-11-27 – 2020-11-28 (×3): 0.5 mg via INTRAVENOUS
  Filled 2020-11-27 (×3): qty 1

## 2020-11-27 MED ORDER — GADOBUTROL 1 MMOL/ML IV SOLN
7.0000 mL | Freq: Once | INTRAVENOUS | Status: AC | PRN
  Administered 2020-11-27: 7 mL via INTRAVENOUS

## 2020-11-27 MED ORDER — SIMVASTATIN 20 MG PO TABS
20.0000 mg | ORAL_TABLET | Freq: Every day | ORAL | Status: DC
  Administered 2020-11-27 – 2020-12-03 (×7): 20 mg via ORAL
  Filled 2020-11-27 (×7): qty 1

## 2020-11-27 MED ORDER — ASPIRIN EC 81 MG PO TBEC
81.0000 mg | DELAYED_RELEASE_TABLET | Freq: Every day | ORAL | Status: DC
  Administered 2020-11-27 – 2020-12-03 (×6): 81 mg via ORAL
  Filled 2020-11-27 (×7): qty 1

## 2020-11-27 MED ORDER — HALOPERIDOL LACTATE 5 MG/ML IJ SOLN
2.0000 mg | Freq: Four times a day (QID) | INTRAMUSCULAR | Status: DC | PRN
  Administered 2020-11-27 – 2020-11-29 (×5): 2 mg via INTRAMUSCULAR
  Filled 2020-11-27 (×5): qty 1

## 2020-11-27 MED ORDER — MAGNESIUM SULFATE IN D5W 1-5 GM/100ML-% IV SOLN
1.0000 g | Freq: Once | INTRAVENOUS | Status: AC
  Administered 2020-11-27: 1 g via INTRAVENOUS
  Filled 2020-11-27: qty 100

## 2020-11-27 MED ORDER — LACTATED RINGERS IV BOLUS
500.0000 mL | Freq: Once | INTRAVENOUS | Status: AC
  Administered 2020-11-27: 500 mL via INTRAVENOUS

## 2020-11-27 MED ORDER — OLANZAPINE 5 MG PO TABS
5.0000 mg | ORAL_TABLET | Freq: Every day | ORAL | Status: DC
  Administered 2020-11-27 – 2020-11-28 (×2): 5 mg via ORAL
  Filled 2020-11-27 (×2): qty 1

## 2020-11-27 MED ORDER — MEMANTINE HCL 10 MG PO TABS
10.0000 mg | ORAL_TABLET | Freq: Two times a day (BID) | ORAL | Status: DC
  Administered 2020-11-27 – 2020-12-03 (×13): 10 mg via ORAL
  Filled 2020-11-27 (×14): qty 1

## 2020-11-27 MED ORDER — CYANOCOBALAMIN 1000 MCG/ML IJ SOLN: 1000.0000 ug | INTRAMUSCULAR | Status: AC

## 2020-11-27 MED ORDER — PANTOPRAZOLE SODIUM 40 MG PO TBEC
40.0000 mg | DELAYED_RELEASE_TABLET | Freq: Every day | ORAL | Status: DC
  Administered 2020-11-27 – 2020-12-03 (×6): 40 mg via ORAL
  Filled 2020-11-27 (×7): qty 1

## 2020-11-27 NOTE — Progress Notes (Signed)
  Echocardiogram 2D Echocardiogram has been performed.  Isaac [name] 11/27/2020, 9:43 AM

## 2020-11-27 NOTE — Consult Note (Signed)
Isaac A. Merlene Laughter, MD     [email or web address]          Isaac [name] is an 80 y.o. male.   ASSESSMENT/PLAN: SUBACUTE GAIT IMPAIRMENT, DYSARTHRIA AND COGNITIVE IMPAIRMENT: The patient does have a baseline history of dementia and this may be progression of baseline dementia. Additionally, there is significant vitamin B12 deficiency which could aggravate the situation. The B12 deficiency will be replaced. Orthostatics are also recommended. Baseline dementia Acute agitated confusion/delirium:  This is likely some sundowning in the setting of acute hospitalization and baseline dementia. Medications have been given for this. Minimal will be use.    This history is obtained from the chart and the family. The patient reports with about 1 month history of worsening dizziness on standing and balance impairment. He does have a baseline history of dementia. The the patient is quite restless at this time and this limits the evaluation. The family does report he has baseline dementia but typically gets around without falling. He is quite agitated and trying to get out of the bed and restless during the evaluation today. He was not this confused and at restless until this even.  GENERAL:  He is quite restless trying to get out of bed and pull things out.  HEENT:  Normal  ABDOMEN: soft  EXTREMITIES: No edema   BACK: normal  SKIN: Normal by inspection.    MENTAL STATUS:   CRANIAL NERVES:  Limited but pupils are reactive. Facial muscle strength is symmetric  MOTOR:  He moves all 4 extremities vigorously.  COORDINATION:  no tremors are noted. No dysmetria myoclonus or parkinsonism.  REFLEXES: Deep tendon reflexes are symmetrical and normal.  SENSATION: Normal to pain.     Blood pressure (!) 179/79, pulse 62, temperature 97.8 F (36.6 C), temperature source Oral, resp. rate 18, height '5\' 10"'$  (1.778 m), weight 65.7 kg, SpO2 97 %.  Past Medical History:  Diagnosis  Date   Allergic rhinitis    Allergy    Arthritis 06/04/2014   Trial of sulindac 05/30/14 since cri risk with other nsaids    Barrett's esophagus 10/27/2011   BPH (benign prostatic hyperplasia) 02/20/2014   CAD (coronary artery disease) of artery bypass graft, Occluded VG-non dominant LCX medical therapy 11/25/2013   CAD (coronary artery disease), native coronary artery - LIMA-LAD, SVG-Diag, SVG-RCA 2002    Catheterization 2002 showing occluded LAD, 99% diagonal, occluded distal circumflex, 90% nondominant right coronary artery  CABG 08/06/00 by Dr. Nils Pyle with LIMA to LAD, SVG to diagonal, SVG to right coronary artery, circumflex was noted to be too small to graft     CAP (community acquired pneumonia) 04/16/2016   See cxr 04/15/16 > treated with 7 days of Levaquin 500 mg daily with resolution radiographically and clinically.   Cataract    Cerumen impaction 06/05/2015   CHF (congestive heart failure), NYHA class III ([place]) 02/20/2014   Dyspnea    with exertion    Dyspnea on exertion 11/08/2013   Followed as Primary Care Patient/ [facility]/ [name]  - new onset early June 2015  - 11/08/2013  Walked RA x 3 laps @ 185 ft each stopped due to  End of study, mild sob, no cp and no desat, EKG ok  - Cardiac w/u inconclusive but not clearly having ischemia  11/24/13   - 09/11/2016  Walked RA x 3 laps @ 185 ft each stopped due to  End of study, nl pace, no sob or desat   -  Spirometry 09/11/2016     Essential hypertension    Referred to Int med [facility] 12/12/2016    GERD (gastroesophageal reflux disease)    H/O hiatal hernia    History of skin cancer    bilateral arms with removal   Hyperlipidemia    Hyperlipidemia LDL goal <70    Followed as Primary Care Patient/ [facility]/ [name]     - Target LDL < 70 as has IHD     Hypertension    Hypertensive heart disease    Followed as Primary Care Patient/ [facility]/ [name]      Ischemic heart disease    Left femoral hernia s/p laparoscopic repair  05/28/2017 05/28/2017   Lumbar disc disease    Myocardial infarction [facility]) 2002   Right inguinal hernia s/p laparoscopic repair 05/28/2017 05/28/2017   Stage 3 chronic kidney disease ([place]) 05/22/2015   trial off lasix/ppi/clinoril / [place] and renal eval 05/22/2015 >>>     T wave inversion in EKG 02/20/2014   Unstable angina ([place]) 11/23/2013    Past Surgical History:  Procedure Laterality Date   COLONOSCOPY     CORONARY ARTERY BYPASS GRAFT  2002   LIMA-LAD, SVG-Diag, SVG-RCA   EYE SURGERY Bilateral 1995, 2000   ioc for cataracts   GROIN DISSECTION  01/23/2012   Procedure: Virl Son EXPLORATION;  Surgeon: Harl Bowie, MD;  Location: [facility];  Service: General;  Laterality: Left;  Left Inguinal Exploration, release of scar tissue, Placement of Mesh   INGUINAL HERNIA REPAIR Left Gisela Bilateral 05/28/2017   Procedure: LAPAROSCOPIC RIGHT AND LEFT INGUINAL HERNIA REPAIR WITH MESH;  Surgeon: Michael Boston, MD;  Location: [place];  Service: General;  Laterality: Bilateral;   INSERTION OF MESH Bilateral 05/28/2017   Procedure: INSERTION OF MESH;  Surgeon: Michael Boston, MD;  Location: [place];  Service: General;  Laterality: Bilateral;   LEFT HEART CATHETERIZATION WITH CORONARY ANGIOGRAM N/A 11/24/2013   Procedure: LEFT HEART CATHETERIZATION WITH CORONARY ANGIOGRAM;  Surgeon: Peter M Martinique, MD;  Location: [facility] CATH LAB;  Service: Cardiovascular;  Laterality: N/A;   NOSE SURGERY  1985    Family History  Problem Relation Age of Onset   Breast cancer Mother    Diabetes Mother    Hypertension Father    Dementia Father 39       early 88s   Diabetes Sister    COPD Brother        smoker   Colon cancer Neg Hx    Esophageal cancer Neg Hx    Rectal cancer Neg Hx    Stomach cancer Neg Hx     Social History:  reports that he has never smoked. He quit smokeless tobacco use about 6 years ago.  His smokeless tobacco use included chew. He reports  that he does not drink alcohol and does not use drugs.  Allergies: No Known Allergies  Medications: Prior to Admission medications   Medication Sig Start Date End Date Taking? Authorizing Provider  albuterol (VENTOLIN HFA) 108 (90 Base) MCG/ACT inhaler Inhale 2 puffs into the lungs every 6 (six) hours as needed for wheezing or shortness of breath. 02/09/20  Yes Noemi Chapel P, DO  amLODipine (NORVASC) 2.5 MG tablet Take 2.5 mg by mouth daily. 09/13/20  Yes [provider]  aspirin 81 MG EC tablet Take 81 mg by mouth every morning.   Yes [provider]  Cholecalciferol (VITAMIN D) 2000 UNITS tablet Take 2,000  Units by mouth daily.   Yes [provider]  docusate sodium (COLACE) 100 MG capsule Take 1 capsule by mouth every other day   Yes [provider]  fluticasone (FLONASE) 50 MCG/ACT nasal spray Place 2 sprays into both nostrils daily as needed for allergies or rhinitis.    Yes [provider]  Fluticasone-Salmeterol (ADVAIR DISKUS) 250-50 MCG/DOSE AEPB Inhale 1 puff into the lungs 2 (two) times daily. 11/01/19  Yes Noemi Chapel P, DO  furosemide (LASIX) 40 MG tablet Take 1 tablet (40 mg total) by mouth daily as needed for fluid or edema. 07/06/20  Yes Dettinger, Fransisca Kaufmann, MD  memantine (NAMENDA) 10 MG tablet Take 1 tablet (10 mg total) by mouth 2 (two) times daily. Start after finishing the namenda starter pack first 07/16/20  Yes Garvin Fila, MD  metoprolol succinate (TOPROL-XL) 50 MG 24 hr tablet TAKE 1 TABLET BY MOUTH IN THE MORNING AND 1/2 TABLET IN THE EVENING 02/13/20  Yes Dettinger, Fransisca Kaufmann, MD  Multiple Vitamin (MULTIVITAMIN WITH MINERALS) TABS Take 1 tablet by mouth daily.   Yes [provider]  multivitamin-lutein (OCUVITE-LUTEIN) CAPS capsule Take 1 capsule by mouth daily.   Yes [provider]  nitroGLYCERIN (NITROSTAT) 0.4 MG SL tablet DISSOLVE ONE TABLET UNDER THE TONGUE EVERY 5 MINUTES AS NEEDED FOR CHEST PAIN.  DO  NOT EXCEED A TOTAL OF 3 DOSES IN 15 MINUTES Patient taking differently: Place 0.4 mg under the tongue every 5 (five) minutes as needed. 11/05/20  Yes Dettinger, Fransisca Kaufmann, MD  omeprazole (PRILOSEC) 20 MG capsule Take 1 capsule (20 mg total) by mouth 2 (two) times daily. 02/13/20  Yes Dettinger, Fransisca Kaufmann, MD  ondansetron (ZOFRAN) 4 MG tablet TAKE 1 TABLET BY MOUTH THREE TIMES DAILY AS NEEDED FOR NAUSEA AND VOMITING Patient taking differently: Take 4 mg by mouth every 8 (eight) hours as needed for nausea or vomiting. 08/29/20  Yes Mannam, Praveen, MD  Pirfenidone (ESBRIET) 267 MG TABS TAKE 2 TABLETS (534 MG TOTAL) BY MOUTH IN THE MORNING, AT NOON, AND AT BEDTIME. 09/10/20 09/10/21 Yes Mannam, Praveen, MD  potassium chloride SA (KLOR-CON M20) 20 MEQ tablet TAKE ONE TABLET BY MOUTH ONCE DAILY WHEN  YOU  TAKE LASIX 08/12/17  Yes Dettinger, Fransisca Kaufmann, MD  simethicone (MYLICON) 0000000 MG chewable tablet Chew 125 mg by mouth every 6 (six) hours as needed for flatulence.   Yes [provider]  simvastatin (ZOCOR) 20 MG tablet TAKE 1 TABLET BY MOUTH AT BEDTIME 10/26/20  Yes Dettinger, Fransisca Kaufmann, MD    Scheduled Meds:  aspirin EC  81 mg Oral Daily   memantine  10 mg Oral BID   pantoprazole  40 mg Oral Daily   simvastatin  20 mg Oral QHS   Continuous Infusions:  0.9 % NaCl with KCl 20 mEq / L Stopped (11/27/20 1233)   PRN Meds:.LORazepam     Results for orders placed or performed during the hospital encounter of 11/26/20 (from the past 48 hour(s))  Comprehensive metabolic panel     Status: Abnormal   Collection Time: 11/26/20  5:43 PM  Result Value Ref Range   Sodium 137 135 - 145 mmol/L   Potassium 4.1 3.5 - 5.1 mmol/L   Chloride 106 98 - 111 mmol/L   CO2 25 22 - 32 mmol/L   Glucose, Bld 97 70 - 99 mg/dL    Comment: Glucose reference range applies only to samples taken after fasting for at least 8 hours.  BUN 26 (H) 8 - 23 mg/dL   Creatinine, Ser 1.69 (H) 0.61 - 1.24 mg/dL   Calcium 8.7 (L) 8.9  - 10.3 mg/dL   Total Protein 7.3 6.5 - 8.1 g/dL   Albumin 3.9 3.5 - 5.0 g/dL   AST 15 15 - 41 U/L   ALT 10 0 - 44 U/L   Alkaline Phosphatase 67 38 - 126 U/L   Total Bilirubin 0.5 0.3 - 1.2 mg/dL   GFR, Estimated 41 (L) >60 mL/min    Comment: (NOTE) Calculated using the CKD-EPI Creatinine Equation (2021)    Anion gap 6 5 - 15    Comment: Performed at [facility], [address], [place], [place] [address]  Troponin I (High Sensitivity)     Status: None   Collection Time: 11/26/20  5:43 PM  Result Value Ref Range   Troponin I (High Sensitivity) 9 <18 ng/L    Comment: (NOTE) Elevated high sensitivity troponin I (hsTnI) values and significant  changes across serial measurements may suggest ACS but many other  chronic and acute conditions are known to elevate hsTnI results.  Refer to the "Links" section for chest pain algorithms and additional  guidance. Performed at [facility], [address] [place], [place] [address]   CBC with Differential     Status: Abnormal   Collection Time: 11/26/20  5:43 PM  Result Value Ref Range   WBC 7.7 4.0 - 10.5 K/uL   RBC 3.87 (L) 4.22 - 5.81 MIL/uL   Hemoglobin 12.9 (L) 13.0 - 17.0 g/dL   HCT 37.9 (L) 39.0 - 52.0 %   MCV 97.9 80.0 - 100.0 fL   MCH 33.3 26.0 - 34.0 pg   MCHC 34.0 30.0 - 36.0 g/dL   RDW 13.2 11.5 - 15.5 %   Platelets 147 (L) 150 - 400 K/uL   nRBC 0.0 0.0 - 0.2 %   Neutrophils Relative % 63 %   Neutro Abs 4.9 1.7 - 7.7 K/uL   Lymphocytes Relative 23 %   Lymphs Abs 1.8 0.7 - 4.0 K/uL   Monocytes Relative 10 %   Monocytes Absolute 0.8 0.1 - 1.0 K/uL   Eosinophils Relative 2 %   Eosinophils Absolute 0.1 0.0 - 0.5 K/uL   Basophils Relative 1 %   Basophils Absolute 0.1 0.0 - 0.1 K/uL   Immature Granulocytes 1 %   Abs Immature Granulocytes 0.04 0.00 - 0.07 K/uL    Comment: Performed at [facility], [address], [place], [place] [address]  TSH     Status: None   Collection Time: 11/26/20  5:43 PM  Result Value Ref Range    TSH 1.720 0.350 - 4.500 uIU/mL    Comment: Performed by a 3rd Generation assay with a functional sensitivity of <=0.01 uIU/mL. Performed at [facility], [address], [place], [place] [address]   Resp Panel by RT-PCR (Flu A&B, Covid) Nasopharyngeal Swab     Status: None   Collection Time: 11/26/20  5:43 PM   Specimen: Nasopharyngeal Swab; Nasopharyngeal(NP) swabs in vial transport medium  Result Value Ref Range   SARS Coronavirus 2 by RT PCR NEGATIVE NEGATIVE    Comment: (NOTE) SARS-CoV-2 target nucleic acids are NOT DETECTED.  The SARS-CoV-2 RNA is generally detectable in upper respiratory specimens during the acute phase of infection. The lowest concentration of SARS-CoV-2 viral copies this assay can detect is 138 copies/mL. A negative result does not preclude SARS-Cov-2 infection and should not be used as the sole basis  for treatment or other patient management decisions. A negative result may occur with  improper specimen collection/handling, submission of specimen other than nasopharyngeal swab, presence of viral mutation(s) within the areas targeted by this assay, and inadequate number of viral copies(<138 copies/mL). A negative result must be combined with clinical observations, patient history, and epidemiological information. The expected result is Negative.  Fact Sheet for Patients:  EntrepreneurPulse.com.au  Fact Sheet for Healthcare Providers:  IncredibleEmployment.be  This test is no t yet approved or cleared by the [place] FDA and  has been authorized for detection and/or diagnosis of SARS-CoV-2 by FDA under an Emergency Use Authorization (EUA). This EUA will remain  in effect (meaning this test can be used) for the duration of the COVID-19 declaration under Section 564(b)(1) of the Act, 21 U.S.C.section 360bbb-3(b)(1), unless the authorization is terminated  or revoked sooner.       Influenza A by PCR NEGATIVE NEGATIVE    Influenza B by PCR NEGATIVE NEGATIVE    Comment: (NOTE) The Xpert Xpress SARS-CoV-2/FLU/RSV plus assay is intended as an aid in the diagnosis of influenza from Nasopharyngeal swab specimens and should not be used as a sole basis for treatment. Nasal washings and aspirates are unacceptable for Xpert Xpress SARS-CoV-2/FLU/RSV testing.  Fact Sheet for Patients: EntrepreneurPulse.com.au  Fact Sheet for Healthcare Providers: IncredibleEmployment.be  This test is not yet approved or cleared by the [place] FDA and has been authorized for detection and/or diagnosis of SARS-CoV-2 by FDA under an Emergency Use Authorization (EUA). This EUA will remain in effect (meaning this test can be used) for the duration of the COVID-19 declaration under Section 564(b)(1) of the Act, 21 U.S.C. section 360bbb-3(b)(1), unless the authorization is terminated or revoked.  Performed at [facility], [address], [place], [place] [address]   Troponin I (High Sensitivity)     Status: None   Collection Time: 11/26/20  8:17 PM  Result Value Ref Range   Troponin I (High Sensitivity) 9 <18 ng/L    Comment: (NOTE) Elevated high sensitivity troponin I (hsTnI) values and significant  changes across serial measurements may suggest ACS but many other  chronic and acute conditions are known to elevate hsTnI results.  Refer to the "Links" section for chest pain algorithms and additional  guidance. Performed at [facility], [address] [place], [place] [address]   Vitamin B12     Status: Abnormal   Collection Time: 11/26/20  8:17 PM  Result Value Ref Range   Vitamin B-12 159 (L) 180 - 914 pg/mL    Comment: (NOTE) This assay is not validated for testing neonatal or myeloproliferative syndrome specimens for Vitamin B12 levels. Performed at [facility], [address] [place], [place] [address]   Folate     Status: None   Collection Time: 11/26/20  8:17 PM   Result Value Ref Range   Folate 11.1 >5.9 ng/mL    Comment: Performed at [facility], [address] [place],  [address]  Urinalysis, Routine w reflex microscopic Urine, Clean Catch     Status: None   Collection Time: 11/26/20 11:18 PM  Result Value Ref Range   Color, Urine YELLOW YELLOW   APPearance CLEAR CLEAR   Specific Gravity, Urine 1.009 1.005 - 1.030   pH 6.0 5.0 - 8.0   Glucose, UA NEGATIVE NEGATIVE mg/dL   Hgb urine dipstick NEGATIVE NEGATIVE   Bilirubin Urine NEGATIVE NEGATIVE   Ketones, ur NEGATIVE NEGATIVE mg/dL   Protein, ur NEGATIVE NEGATIVE  mg/dL   Nitrite NEGATIVE NEGATIVE   Leukocytes,Ua NEGATIVE NEGATIVE    Comment: Performed at [facility], [address], [place], [place] [address]  Comprehensive metabolic panel     Status: Abnormal   Collection Time: 11/27/20  3:46 AM  Result Value Ref Range   Sodium 139 135 - 145 mmol/L   Potassium 3.7 3.5 - 5.1 mmol/L   Chloride 108 98 - 111 mmol/L   CO2 25 22 - 32 mmol/L   Glucose, Bld 139 (H) 70 - 99 mg/dL    Comment: Glucose reference range applies only to samples taken after fasting for at least 8 hours.   BUN 24 (H) 8 - 23 mg/dL   Creatinine, Ser 1.50 (H) 0.61 - 1.24 mg/dL   Calcium 9.0 8.9 - 10.3 mg/dL   Total Protein 7.2 6.5 - 8.1 g/dL   Albumin 3.7 3.5 - 5.0 g/dL   AST 16 15 - 41 U/L   ALT 10 0 - 44 U/L   Alkaline Phosphatase 67 38 - 126 U/L   Total Bilirubin 0.6 0.3 - 1.2 mg/dL   GFR, Estimated 47 (L) >60 mL/min    Comment: (NOTE) Calculated using the CKD-EPI Creatinine Equation (2021)    Anion gap 6 5 - 15    Comment: Performed at [facility], [address] [place], [place] [address]  CBC     Status: Abnormal   Collection Time: 11/27/20  3:46 AM  Result Value Ref Range   WBC 8.6 4.0 - 10.5 K/uL   RBC 3.94 (L) 4.22 - 5.81 MIL/uL   Hemoglobin 12.9 (L) 13.0 - 17.0 g/dL   HCT 38.7 (L) 39.0 - 52.0 %   MCV 98.2 80.0 - 100.0 fL   MCH 32.7 26.0 - 34.0 pg   MCHC 33.3 30.0 - 36.0 g/dL   RDW 13.0  11.5 - 15.5 %   Platelets 144 (L) 150 - 400 K/uL   nRBC 0.0 0.0 - 0.2 %    Comment: Performed at [facility], [address] [place], [place] [address]  Protime-INR     Status: None   Collection Time: 11/27/20  3:46 AM  Result Value Ref Range   Prothrombin Time 14.4 11.4 - 15.2 seconds   INR 1.1 0.8 - 1.2    Comment: (NOTE) INR goal varies based on device and disease states. Performed at [facility], [address], [place], [place] [address]   APTT     Status: None   Collection Time: 11/27/20  3:46 AM  Result Value Ref Range   aPTT 28 24 - 36 seconds    Comment: Performed at [facility], [address], [place], [place] [address]  Magnesium     Status: None   Collection Time: 11/27/20  3:46 AM  Result Value Ref Range   Magnesium 1.9 1.7 - 2.4 mg/dL    Comment: Performed at [facility], [address] [place], [place] [address]  Phosphorus     Status: None   Collection Time: 11/27/20  3:46 AM  Result Value Ref Range   Phosphorus 2.6 2.5 - 4.6 mg/dL    Comment: Performed at [facility], [address] [place], [place] [address]  Lipid panel     Status: None   Collection Time: 11/27/20  3:46 AM  Result Value Ref Range   Cholesterol 151 0 - 200 mg/dL   Triglycerides 114 <150 mg/dL   HDL 53 >40 mg/dL   Total CHOL/HDL Ratio 2.8 RATIO   VLDL  23 0 - 40 mg/dL   LDL Cholesterol 75 0 - 99 mg/dL    Comment:        Total Cholesterol/HDL:CHD Risk Coronary Heart Disease Risk Table                     Men   Women  1/2 Average Risk   3.4   3.3  Average Risk       5.0   4.4  2 X Average Risk   9.6   7.1  3 X Average Risk  23.4   11.0        Use the calculated Patient Ratio above and the CHD Risk Table to determine the patient's CHD Risk.        ATP III CLASSIFICATION (LDL):  <100     mg/dL   Optimal  100-129  mg/dL   Near or Above                    Optimal  130-159  mg/dL   Borderline  160-189  mg/dL   High  >190     mg/dL   Very High Performed at [facility] [place], [place] [address]   Hemoglobin A1c     Status: None   Collection Time: 11/27/20  3:46 AM  Result Value Ref Range   Hgb A1c MFr Bld 5.4 4.8 - 5.6 %    Comment: (NOTE) Pre diabetes:          5.7%-6.4%  Diabetes:              >6.4%  Glycemic control for   <7.0% adults with diabetes    Mean Plasma Glucose 108.28 mg/dL    Comment: Performed at [facility] [address], [place], [place] [address]  CK     Status: Abnormal   Collection Time: 11/27/20  3:46 AM  Result Value Ref Range   Total CK 38 (L) 49 - 397 U/L    Comment: Performed at [facility], [address], [place], [place] [address]    Studies/Results: BRAIN MRI ,RA NECK MRA FINDINGS: MRI HEAD   Motion artifact is present.   Brain: There is no acute infarction or intracranial hemorrhage. There is no intracranial mass, mass effect, or edema. There is no hydrocephalus or extra-axial fluid collection. Patchy T2 hyperintensity in the supratentorial white matter is nonspecific but probably reflects mild to moderate chronic microvascular ischemic changes. Prominence of the ventricles and sulci reflects minor generalized parenchymal volume loss. 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 Multiple small chronic cerebellar infarcts.   Vascular: Major vessel flow voids at the skull base are preserved.   Skull and upper cervical spine: Normal marrow signal is preserved.   Sinuses/Orbits: Minor mucosal thickening. Bilateral lens replacements.   Other: Sella is unremarkable.  Mastoid air cells are clear.   MRA HEAD   Intracranial internal carotid arteries are patent. Middle and anterior cerebral arteries are patent. Intracranial vertebral arteries, basilar artery, posterior cerebral arteries are patent. There is no significant stenosis or aneurysm.   MRA NECK   Great vessel origins are patent. Common, internal, and  external carotid arteries are patent. Extracranial codominant vertebral arteries are patent. There is no hemodynamically significant stenosis. Atherosclerotic irregularity at the left greater than right ICA origins.   IMPRESSION: No acute infarction, hemorrhage, or mass. 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   No large vessel occlusion, hemodynamically significant stenosis, or evidence of dissection.     The brain MRI and MRA are reviewed in person. 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 No acute changes are noted on DWI. No significant occlusive disease is noted.   EEG Description: The posterior dominant rhythm consists of '6Hz'$  activity of moderate voltage (25-35 uV) seen predominantly in posterior head regions, symmetric and reactive to eye opening and eye closing. Hyperventilation and photic stimulation were not performed.      ABNORMALITY - Background slow   IMPRESSION: This study is suggestive of mild diffuse encephalopathy, nonspecific etiology. No seizures or epileptiform discharges were seen throughout the recording.   EEG 08-2020 Summary  Abnormal EEG showing moderate bihemispheric slowing which is a nonspecific finding seen in a variety of degenerative conditions like dementia and memory loss.  No definite epileptiform activity is noted     Aibhlinn Kalmar A. Merlene [name], M.D.  Diplomate, Tax adviser of Psychiatry and Neurology ( Neurology). 11/27/2020, 6:41 PM

## 2020-11-27 NOTE — Evaluation (Signed)
Physical Therapy Evaluation Patient Details Name: Isaac [name] MRN: [id number] DOB: 03/24/1941 Today's Date: 11/27/2020   History of Present Illness  Isaac [name] is a 80 y.o. male with medical history significant for hypertension, hyperlipidemia, GERD, CAD s/p CABG on aspirin, BPH, CKD stage 3, hearing impairment, Alzheimer's dementia who presents to the emergency department due to episodes of ambulatory difficulty in which patient was holding onto walls as well as 1 month of repetitive movements of right foot and lightheadedness usually when walking or standing.  He presents with slurred speech which lasted about 20 minutes prior to returning to baseline.Patient also complained of dizziness and blurry vision when he stands up from sitting position. He ambulates with a cane at baseline.   Clinical Impression  Patient demonstrates good return for bed mobility and standing up at bedside, but very unsteady with scissoring of legs and staggering left/right when taking steps without AD requiring Min assist to prevent fall, safer using RW and able to ambulate in room/hallway with occasional drifting left/right bumping into nearby objects without loss of balance.  Patient put back to bed after therapy.  Patient will benefit from continued physical therapy in hospital and recommended venue below to increase strength, balance, endurance for safe ADLs and gait.     Follow Up Recommendations Home health PT;Supervision for mobility/OOB;Supervision - Intermittent    Equipment Recommendations  None recommended by PT    Recommendations for Other Services       Precautions / Restrictions Precautions Precautions: Fall Restrictions Weight Bearing Restrictions: No      Mobility  Bed Mobility Overal bed mobility: Modified Independent                  Transfers Overall transfer level: Needs assistance Equipment used: Rolling walker (2 wheeled);1 person hand held assist Transfers: Sit to/from  [facility] Sit to Stand: Supervision Stand pivot transfers: Min guard       General transfer comment: unsteady on feet with staggering left/right without AD, required use of RW for safety  Ambulation/Gait Ambulation/Gait assistance: Min guard Gait Distance (Feet): 80 Feet Assistive device: Rolling walker (2 wheeled) Gait Pattern/deviations: Decreased step length - right;Decreased step length - left;Decreased stride length;Drifts right/left Gait velocity: decreased   General Gait Details: very unsteady with near falls when attempting ambulation without AD, required use of RW demonstrating slightly labored cadence with occasional drifting left/right, bumping into nearby objects without loss of balance  Stairs            Wheelchair Mobility    Modified Rankin (Stroke Patients Only)       Balance Overall balance assessment: Needs assistance Sitting-balance support: Feet supported;No upper extremity supported Sitting balance-Leahy Scale: Good Sitting balance - Comments: seated at EOB   Standing balance support: During functional activity;No upper extremity supported Standing balance-Leahy Scale: Poor Standing balance comment: fair using RW                             Pertinent Vitals/Pain Pain Assessment: No/denies pain    Home Living Family/patient expects to be discharged to:: Private residence Living Arrangements: Spouse/significant other Available Help at Discharge: Family;Available PRN/intermittently Type of Home: House Home Access: Level entry     Home Layout: One level Home Equipment: Walker - 2 wheels;Tub bench;Shower seat;Wheelchair - manual;Crutches      Prior Function Level of Independence: Independent with assistive device(s)         Comments:  household ambulator using SPC PRN     Hand Dominance        Extremity/Trunk Assessment   Upper Extremity Assessment Upper Extremity Assessment: Defer to OT evaluation     Lower Extremity Assessment Lower Extremity Assessment: Overall WFL for tasks assessed    Cervical / Trunk Assessment Cervical / Trunk Assessment: Normal  Communication   Communication: HOH  Cognition Arousal/Alertness: Awake/alert Behavior During Therapy: WFL for tasks assessed/performed;Impulsive Overall Cognitive Status: Within Functional Limits for tasks assessed                                        General Comments      Exercises     Assessment/Plan    PT Assessment Patient needs continued PT services  PT Problem List Decreased strength;Decreased activity tolerance;Decreased balance;Decreased mobility;Decreased coordination       PT Treatment Interventions DME instruction;Gait training;Stair training;Functional mobility training;Therapeutic activities;Therapeutic exercise;Balance training;Patient/family education    PT Goals (Current goals can be found in the Care Plan section)  Acute Rehab PT Goals Patient Stated Goal: retrun home with family to assist PT Goal Formulation: With patient Time For Goal Achievement: 12/04/20 Potential to Achieve Goals: Good    Frequency Min 3X/week   Barriers to discharge        Co-evaluation               AM-PAC PT "6 Clicks" Mobility  Outcome Measure Help needed turning from your back to your side while in a flat bed without using bedrails?: None Help needed moving from lying on your back to sitting on the side of a flat bed without using bedrails?: None Help needed moving to and from a bed to a chair (including a wheelchair)?: A Little Help needed standing up from a chair using your arms (e.g., wheelchair or bedside chair)?: A Little Help needed to walk in hospital room?: A Little Help needed climbing 3-5 steps with a railing? : A Little 6 Click Score: 20    End of Session   Activity Tolerance: Patient tolerated treatment well;Patient limited by fatigue Patient left: in bed;with call bell/phone  within reach Nurse Communication: Mobility status PT Visit Diagnosis: Unsteadiness on feet (R26.81);Other abnormalities of gait and mobility (R26.89);Difficulty in walking, not elsewhere classified (R26.2)    Time: [id number] PT Time Calculation (min) (ACUTE ONLY): 25 min   Charges:   PT Evaluation $PT Eval Moderate Complexity: 1 Mod PT Treatments $Therapeutic Activity: 23-37 mins        12:35 PM, 11/27/20 Lonell Grandchild, MPT Physical Therapist with [facility] [phone] office [phone] mobile phone

## 2020-11-27 NOTE — H&P (Signed)
History and Physical  Isaac [name] [id number] DOB: 04/07/1941 DOA: 11/26/2020  Referring physician: Thayer Jew, MD PCP: Dettinger, Fransisca Kaufmann, MD  Patient coming from: Home  Chief Complaint: Dizziness  HPI: Isaac [name] is a 80 y.o. male with medical history significant for hypertension, hyperlipidemia, GERD, CAD s/p CABG on aspirin, BPH, CKD stage 3, hearing impairment, Alzheimer's dementia who presents to the emergency department due to episodes of ambulatory difficulty in which patient was holding onto walls as well as 1 month of repetitive movements of right foot and lightheadedness usually when walking or standing.  He presents with slurred speech which lasted about 20 minutes prior to returning to baseline.Patient also complained of dizziness and blurry vision when he stands up from sitting position. He ambulates with a cane at baseline.  ED Course:  In the emergency department, he was initially bradycardic with HR in the 50s, but this has since improved.  BP was 191/119, but this has since improved.  Work-up in the ED showed normocytic anemia, thrombocytopenia troponin x2 was flat at 9.  TSH 1.720, phosphorus 2.6.  Influenza A, B, SARS coronavirus 2 was negative. CT head without contrast showed no acute intracranial abnormality Chest x-ray showed no acute appearing airspace opacity Teleneurology was consulted and recommended further stroke work-up.  IV hydration provided.  Hospitalist was asked to admit patient for further evaluation and management.   Review of Systems: Constitutional: Negative for chills and fever.  HENT: Negative for ear pain and sore throat.   Eyes: Negative for pain and visual disturbance.  Respiratory: Negative for cough, chest tightness and shortness of breath.   Cardiovascular: Negative for chest pain and palpitations.  Gastrointestinal: Negative for abdominal pain and vomiting.  Endocrine: Negative for polyphagia and polyuria.  Genitourinary:  Negative for decreased urine volume, dysuria, enuresis Musculoskeletal: Negative for arthralgias and back pain.  Skin: Negative for color change and rash.  Allergic/Immunologic: Negative for immunocompromised state.  Neurological: Negative for tremors, syncope, speech difficulty, weakness, light-headedness and headaches.  Hematological: Does not bruise/bleed easily.  All other systems reviewed and are negative   Past Medical History:  Diagnosis Date   Allergic rhinitis    Allergy    Arthritis 06/04/2014   Trial of sulindac 05/30/14 since cri risk with other nsaids    Barrett's esophagus 10/27/2011   BPH (benign prostatic hyperplasia) 02/20/2014   CAD (coronary artery disease) of artery bypass graft, Occluded VG-non dominant LCX medical therapy 11/25/2013   CAD (coronary artery disease), native coronary artery - LIMA-LAD, SVG-Diag, SVG-RCA 2002    Catheterization 2002 showing occluded LAD, 99% diagonal, occluded distal circumflex, 90% nondominant right coronary artery  CABG 08/06/00 by Dr. Nils Pyle with LIMA to LAD, SVG to diagonal, SVG to right coronary artery, circumflex was noted to be too small to graft     CAP (community acquired pneumonia) 04/16/2016   See cxr 04/15/16 > treated with 7 days of Levaquin 500 mg daily with resolution radiographically and clinically.   Cataract    Cerumen impaction 06/05/2015   CHF (congestive heart failure), NYHA class III ([place]) 02/20/2014   Dyspnea    with exertion    Dyspnea on exertion 11/08/2013   Followed as Primary Care Patient/ [facility]/ [name]  - new onset early June 2015  - 11/08/2013  Walked RA x 3 laps @ 185 ft each stopped due to  End of study, mild sob, no cp and no desat, EKG ok  - Cardiac w/u inconclusive but not clearly  having ischemia  11/24/13   - 09/11/2016  Walked RA x 3 laps @ 185 ft each stopped due to  End of study, nl pace, no sob or desat   - Spirometry 09/11/2016     Essential hypertension    Referred to Int med [facility] 12/12/2016     GERD (gastroesophageal reflux disease)    H/O hiatal hernia    History of skin cancer    bilateral arms with removal   Hyperlipidemia    Hyperlipidemia LDL goal <70    Followed as Primary Care Patient/ [facility]/ [name]     - Target LDL < 70 as has IHD     Hypertension    Hypertensive heart disease    Followed as Primary Care Patient/ [facility]/ [name]      Ischemic heart disease    Left femoral hernia s/p laparoscopic repair 05/28/2017 05/28/2017   Lumbar disc disease    Myocardial infarction [facility]) 2002   Right inguinal hernia s/p laparoscopic repair 05/28/2017 05/28/2017   Stage 3 chronic kidney disease ([place]) 05/22/2015   trial off lasix/ppi/clinoril / [place] and renal eval 05/22/2015 >>>     T wave inversion in EKG 02/20/2014   Unstable angina ([place]) 11/23/2013   Past Surgical History:  Procedure Laterality Date   COLONOSCOPY     CORONARY ARTERY BYPASS GRAFT  2002   LIMA-LAD, SVG-Diag, SVG-RCA   EYE SURGERY Bilateral 1995, 2000   ioc for cataracts   GROIN DISSECTION  01/23/2012   Procedure: Virl Son EXPLORATION;  Surgeon: Harl Bowie, MD;  Location: [facility];  Service: General;  Laterality: Left;  Left Inguinal Exploration, release of scar tissue, Placement of Mesh   INGUINAL HERNIA REPAIR Left [place] Bilateral 05/28/2017   Procedure: LAPAROSCOPIC RIGHT AND LEFT INGUINAL HERNIA REPAIR WITH MESH;  Surgeon: Michael Boston, MD;  Location: [place];  Service: General;  Laterality: Bilateral;   INSERTION OF MESH Bilateral 05/28/2017   Procedure: INSERTION OF MESH;  Surgeon: Michael Boston, MD;  Location: [place];  Service: General;  Laterality: Bilateral;   LEFT HEART CATHETERIZATION WITH CORONARY ANGIOGRAM N/A 11/24/2013   Procedure: LEFT HEART CATHETERIZATION WITH CORONARY ANGIOGRAM;  Surgeon: Peter M Martinique, MD;  Location: [facility] C[facility] LAB;  Service: Cardiovascular;  Laterality: N/A;   NOSE SURGERY  1985    Social  History:  reports that he has never smoked. He quit smokeless tobacco use about 6 years ago.  His smokeless tobacco use included chew. He reports that he does not drink alcohol and does not use drugs.   No Known Allergies  Family History  Problem Relation Age of Onset   Breast cancer Mother    Diabetes Mother    Hypertension Father    Dementia Father 81       early 81s   Diabetes Sister    COPD Brother        smoker   Colon cancer Neg Hx    Esophageal cancer Neg Hx    Rectal cancer Neg Hx    Stomach cancer Neg Hx      Prior to Admission medications   Medication Sig Start Date End Date Taking? Authorizing Provider  albuterol (VENTOLIN HFA) 108 (90 Base) MCG/ACT inhaler Inhale 2 puffs into the lungs every 6 (six) hours as needed for wheezing or shortness of breath. 02/09/20  Yes Noemi Chapel P, DO  amLODipine (NORVASC) 2.5 MG tablet Take 2.5 mg by mouth daily. 09/13/20  Yes [provider]  aspirin 81 MG EC tablet Take 81 mg by mouth every morning.   Yes [provider]  Cholecalciferol (VITAMIN D) 2000 UNITS tablet Take 2,000 Units by mouth daily.   Yes [provider]  docusate sodium (COLACE) 100 MG capsule Take 1 capsule by mouth every other day   Yes [provider]  fluticasone (FLONASE) 50 MCG/ACT nasal spray Place 2 sprays into both nostrils daily as needed for allergies or rhinitis.    Yes [provider]  Fluticasone-Salmeterol (ADVAIR DISKUS) 250-50 MCG/DOSE AEPB Inhale 1 puff into the lungs 2 (two) times daily. 11/01/19  Yes Noemi Chapel P, DO  furosemide (LASIX) 40 MG tablet Take 1 tablet (40 mg total) by mouth daily as needed for fluid or edema. 07/06/20  Yes Dettinger, Fransisca Kaufmann, MD  memantine (NAMENDA) 10 MG tablet Take 1 tablet (10 mg total) by mouth 2 (two) times daily. Start after finishing the namenda starter pack first 07/16/20  Yes Garvin Fila, MD  metoprolol succinate (TOPROL-XL) 50 MG 24 hr tablet TAKE 1 TABLET BY MOUTH  IN THE MORNING AND 1/2 TABLET IN THE EVENING 02/13/20  Yes Dettinger, Fransisca Kaufmann, MD  Multiple Vitamin (MULTIVITAMIN WITH MINERALS) TABS Take 1 tablet by mouth daily.   Yes [provider]  multivitamin-lutein (OCUVITE-LUTEIN) CAPS capsule Take 1 capsule by mouth daily.   Yes [provider]  nitroGLYCERIN (NITROSTAT) 0.4 MG SL tablet DISSOLVE ONE TABLET UNDER THE TONGUE EVERY 5 MINUTES AS NEEDED FOR CHEST PAIN.  DO NOT EXCEED A TOTAL OF 3 DOSES IN 15 MINUTES Patient taking differently: Place 0.4 mg under the tongue every 5 (five) minutes as needed. 11/05/20  Yes Dettinger, Fransisca Kaufmann, MD  omeprazole (PRILOSEC) 20 MG capsule Take 1 capsule (20 mg total) by mouth 2 (two) times daily. 02/13/20  Yes Dettinger, Fransisca Kaufmann, MD  ondansetron (ZOFRAN) 4 MG tablet TAKE 1 TABLET BY MOUTH THREE TIMES DAILY AS NEEDED FOR NAUSEA AND VOMITING Patient taking differently: Take 4 mg by mouth every 8 (eight) hours as needed for nausea or vomiting. 08/29/20  Yes Mannam, Praveen, MD  Pirfenidone (ESBRIET) 267 MG TABS TAKE 2 TABLETS (534 MG TOTAL) BY MOUTH IN THE MORNING, AT NOON, AND AT BEDTIME. 09/10/20 09/10/21 Yes Mannam, Praveen, MD  potassium chloride SA (KLOR-CON M20) 20 MEQ tablet TAKE ONE TABLET BY MOUTH ONCE DAILY WHEN  YOU  TAKE LASIX 08/12/17  Yes Dettinger, Fransisca Kaufmann, MD  simethicone (MYLICON) 0000000 MG chewable tablet Chew 125 mg by mouth every 6 (six) hours as needed for flatulence.   Yes [provider]  simvastatin (ZOCOR) 20 MG tablet TAKE 1 TABLET BY MOUTH AT BEDTIME 10/26/20  Yes Dettinger, Fransisca Kaufmann, MD    Physical Exam: BP (!) 195/84   Pulse 61   Temp (!) 97.3 F (36.3 C) (Oral)   Resp 19   SpO2 96%   General: 80 y.o. year-old male well developed well nourished in no acute distress.  Alert and oriented x3. HEENT: NCAT, EOMI Neck: Supple, trachea medial Cardiovascular: Regular rate and rhythm with no rubs or gallops.  No thyromegaly or JVD noted.  No lower extremity edema. 2/4 pulses  in all 4 extremities. Respiratory: Clear to auscultation with no wheezes or rales. Good inspiratory effort. Abdomen: Soft, nontender nondistended with normal bowel sounds x4 quadrants. Muskuloskeletal: No cyanosis, clubbing or edema noted bilaterally Neuro: CN II-XII intact, strength 5/5 x 4, sensation, reflexes intact, no slurred speech. Skin: No ulcerative  lesions noted or rashes Psychiatry: Mood is appropriate for condition and setting          Labs on Admission:  Basic Metabolic Panel: Recent Labs  Lab 11/26/20 1743 11/27/20 0346  NA 137  --   K 4.1  --   CL 106  --   CO2 25  --   GLUCOSE 97  --   BUN 26*  --   CREATININE 1.69*  --   CALCIUM 8.7*  --   PHOS  --  2.6   Liver Function Tests: Recent Labs  Lab 11/26/20 1743  AST 15  ALT 10  ALKPHOS 67  BILITOT 0.5  PROT 7.3  ALBUMIN 3.9   No results for input(s): LIPASE, AMYLASE in the last 168 hours. No results for input(s): AMMONIA in the last 168 hours. CBC: Recent Labs  Lab 11/26/20 1743 11/27/20 0346  WBC 7.7 8.6  NEUTROABS 4.9  --   HGB 12.9* 12.9*  HCT 37.9* 38.7*  MCV 97.9 98.2  PLT 147* 144*   Cardiac Enzymes: No results for input(s): CKTOTAL, CKMB, CKMBINDEX, TROPONINI in the last 168 hours.  BNP (last 3 results) Recent Labs    09/13/20 1443  BNP 161.6*    ProBNP (last 3 results) Recent Labs    03/26/20 1124  PROBNP 5,675*    CBG: No results for input(s): GLUCAP in the last 168 hours.  Radiological Exams on Admission: CT Head Wo Contrast  Result Date: 11/26/2020 CLINICAL DATA:  Mental status change, unknown cause Patient reports dizziness with multiple falls. EXAM: CT HEAD WITHOUT CONTRAST TECHNIQUE: Contiguous axial images were obtained from the base of the skull through the vertex without intravenous contrast. COMPARISON:  Head CT 02/18/2020 FINDINGS: Brain: No evidence of acute infarction, hemorrhage, hydrocephalus, extra-axial collection or mass lesion/mass effect. Stable brain  volume and mild periventricular chronic small vessel ischemia. 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 Vascular: Atherosclerosis of skullbase vasculature without hyperdense vessel or abnormal calcification. Skull: No fracture or focal lesion. Sinuses/Orbits: Paranasal sinuses and mastoid air cells are clear. The visualized orbits are unremarkable. Bilateral cataract resection. Other: None. IMPRESSION: 1. No acute intracranial abnormality. 2. Stable chronic small vessel ischemia. 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 Electronically Signed   By: Keith Rake M.D.   On: 11/26/2020 18:25   DG Chest Portable 1 View  Result Date: 11/26/2020 CLINICAL DATA:  Chest pain, near syncope EXAM: PORTABLE CHEST 1 VIEW COMPARISON:  11/14/2020 FINDINGS: Rotated AP portable examination with scarring and/or fibrosis at the bilateral lung bases. No acute appearing airspace opacity. Cardiomegaly status post median sternotomy and CABG. IMPRESSION: Rotated AP portable examination with scarring and/or fibrosis at the bilateral lung bases. No acute appearing airspace opacity. Electronically Signed   By: Eddie Candle M.D.   On: 11/26/2020 18:00    EKG: I independently viewed the EKG done and my findings are as followed: Normal sinus rhythm at a rate of 60 bpm  Assessment/Plan Present on Admission:  Transient ischemic attack  Hyperlipidemia LDL goal <70  Essential hypertension  GERD (gastroesophageal reflux disease)  Principal Problem:   Transient ischemic attack Active Problems:   Hyperlipidemia LDL goal <70   GERD (gastroesophageal reflux disease)   Essential hypertension   Thrombocytopenia ([place])  Transischemic attack vs Stroke Patient will be admitted to telemetry unit  Echocardiogram in the morning MRI of brain without contrast in the morning MRA head without contrast and MRA of neck with and without contrast in the morning per  teleneurology recommendation EEG in the morning to  rule out seizures Continue aspirin and statin Continue fall precautions and neuro checks Lipid panel and hemoglobin A1c will be checked Continue PT//OT eval and treat Bedside swallow eval by nursing prior to diet Consider SLP if patient fails swallow eval Neurology will be consulted and we shall await further recommendation  Thrombocytopenia Platelets 144; continue to monitor platelet levels morning labs  Hyperlipidemia Continue statin when patient resumes oral intake  Essential hypertension Permissive hypertension will be allowed at this time  GERD Continue Protonix  Dementia Continue Namenda  DVT prophylaxis: SCDs  Code Status: Full code  Family Communication: None at bedside  Disposition Plan:  Patient is from:                        home Anticipated DC to:                   SNF or family members home Anticipated DC date:               2-3 days Anticipated DC barriers:          Patient requires inpatient management for TIA versus stroke further work-up and pending neurology consult  Consults called: Neurology  Admission status: Observation    Bernadette Hoit MD [facility]  11/27/2020, 4:36 AM

## 2020-11-27 NOTE — ED Notes (Signed)
Echo at Bedside

## 2020-11-27 NOTE — Consult Note (Signed)
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 HCT demonstrated bilateral cerebellar chronic strokes which were silent/asymptomatic. Diff dx includes orthostatic hypotension vs vertebrobasilar insufficiency, TIA vs less likely seizure.  CT HEAD: Showed No Acute Hemorrhage or Acute Core Infarct  Our recommendations are outlined below.  Diagnostic Studies: Recommend MRI brain without contrast  Laboratory Studies: Recommend Lipid panel Hemoglobin A1c  Nursing Recommendations: Telemetry, IV Fluids, avoid dextrose containing fluids, Maintain euglycemia Neuro checks q4 hrs x 24 hrs and then per shift Head of bed 30 degrees  Consultations: Recommend Speech therapy if failed dysphagia screen Physical therapy/Occupational therapy  Disposition: Neurology will follow  Additional Recommendations: continue statin and asa check LDL/fasting lipid panel check and document orthostatic vital signs MRI brain w/o contrast EEG MRA brain and neck w/o contrast r/o intra/extracranial stenosis   Imaging: hct chronic bilateral cerebellar stroke  Labs: wbc 7.7 glc 97  Metrics: [facility] Notification Time: 11/26/2020 19:38:40 Stamp Time: 11/26/2020 19:40:28 Callback Response Time: 11/26/2020 19:42:16   ----------------------------------------------------------------------------------------------------  Chief Complaint: dizzy  spells  History of Present Illness: Patient is a 80year old Male.  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 Episodes do not happen while sitting or lying down. He walks w cane. Today had 2 episodes. No recent new medications.      Examination: BP(185/91), Pulse(85), Blood Glucose(97) 1A: Level of Consciousness - Alert; keenly responsive + 0 1B: Ask Month and Age - Both Questions Right + 0 1C: Blink Eyes & Squeeze Hands - Performs Both Tasks + 0 2: Test Horizontal Extraocular Movements - Normal + 0 3: Test Visual Fields - No Visual Loss + 0 4: Test Facial Palsy (Use Grimace if Obtunded) - Normal symmetry + 0 5A: Test Left Arm Motor Drift - Drift, but doesn't hit bed + 1 5B: Test Right Arm Motor Drift - Drift, but doesn't hit bed + 1 6A: Test Left Leg Motor Drift - No Drift for 5 Seconds + 0 6B: Test Right Leg Motor Drift - No Drift for 5 Seconds + 0 7: Test Limb Ataxia (FNF/Heel-Shin) - No Ataxia + 0 8: Test Sensation - Normal; No sensory loss + 0 9: Test Language/Aphasia - Normal; No aphasia + 0 10: Test Dysarthria - Normal + 0 11: Test Extinction/Inattention - No abnormality + 0  NIHSS Score: 2     Patient / Family was informed the Neurology Consult would occur via TeleHealth consult by way of interactive audio and video telecommunications and consented to receiving care in this manner.  Patient is being evaluated for possible acute neurologic impairment and high probability of imminent  or life - threatening deterioration.I spent total of 23 minutes providing care to this patient, including time for face  to face visit via telemedicine, review of medical records, imaging studies and discussion of findings with providers, the patient and / or family.   Dr Barron Schmid   [facility] [phone]  Case [id number]

## 2020-11-27 NOTE — Progress Notes (Signed)
EEG completed, results pending. 

## 2020-11-27 NOTE — Procedures (Signed)
Patient Name: Isaac [name]  MRN: [id number]  Epilepsy Attending: Lora Havens  Referring Physician/Provider: Dr Bernadette Hoit Date: 11/27/2020 Duration: 24.10 mins  Patient history: 80yo M with dizziness. EEG to evaluate for seizure  Level of alertness: Awake  AEDs during EEG study: None  Technical aspects: This EEG study was done with scalp electrodes positioned according to the 10-20 International system of electrode placement. Electrical activity was acquired at a sampling rate of '500Hz'$  and reviewed with a high frequency filter of '70Hz'$  and a low frequency filter of '1Hz'$ . EEG data were recorded continuously and digitally stored.   Description: The posterior dominant rhythm consists of '6Hz'$  activity of moderate voltage (25-35 uV) seen predominantly in posterior head regions, symmetric and reactive to eye opening and eye closing. Hyperventilation and photic stimulation were not performed.     ABNORMALITY - Background slow  IMPRESSION: This study is suggestive of mild diffuse encephalopathy, nonspecific etiology. No seizures or epileptiform discharges were seen throughout the recording.  Jacklyn Branan Barbra Sarks

## 2020-11-27 NOTE — ED Provider Notes (Signed)
Patient signed out pending teleneurology assessment.  Spoke with telemetry neurologist.  Recommend admission for TIA work-up and assessment for vertebral/basilar insufficiency.  Recommend MRI/MRA.  Also recommend orthostatics.  We will plan for admission to the hospitalist.    Merryl Hacker, MD 11/27/20 [phone]

## 2020-11-27 NOTE — ED Notes (Signed)
EEG at bedside.

## 2020-11-27 NOTE — Plan of Care (Signed)
  Problem: Acute Rehab PT Goals(only PT should resolve) Goal: Pt Will Go Supine/Side To Sit Outcome: Progressing Flowsheets (Taken 11/27/2020 1236) Pt will go Supine/Side to Sit:  Independently  with modified independence Goal: Patient Will Transfer Sit To/From Stand Outcome: Progressing Flowsheets (Taken 11/27/2020 1236) Patient will transfer sit to/from stand: with supervision Goal: Pt Will Transfer Bed To Chair/Chair To Bed Outcome: Progressing Flowsheets (Taken 11/27/2020 1236) Pt will Transfer Bed to Chair/Chair to Bed: with supervision Goal: Pt Will Ambulate Outcome: Progressing Flowsheets (Taken 11/27/2020 1236) Pt will Ambulate:  > 125 feet  with supervision  with min guard assist  with rolling walker   12:37 PM, 11/27/20 Lonell Grandchild, MPT Physical Therapist with [facility] [phone] office [phone] mobile phone

## 2020-11-27 NOTE — Progress Notes (Signed)
Patient has continued to get OOB without assistance despite frequent rounds by staff and fall precautions. Patient disoriented to time, place, and situation at this time. When he first arrived to the floor approximately 1300, he was alert and oriented x3. Once patient was safely back in bed, this nurse was asking patient if he knew where he was a he was staring passed this nurse. When this nurse asked what he was looking at, he replied "that man standing behind you with a shotgun." This nurse reoriented the patient. Dr. Carles Collet made aware. Bed alarm remains in place, will continue to monitor.

## 2020-11-27 NOTE — Progress Notes (Signed)
PROGRESS NOTE  Isaac [name] [id number] DOB: Sep 11, 1940 DOA: 11/26/2020 PCP: Dettinger, Fransisca Kaufmann, MD  Brief History:  80 year old male with a history of dementia, CAD, CKD stage III, diastolic CHF, hypertension, hyperlipidemia , and pulmonary fibrosis presenting with 51-monthhistory of transient episodes of dizziness, gait instability, and slurred speech.  The patient is a poor historian secondary to his dementia.  History is obtained from review of the medical record and speaking with the patient spouse at the bedside.  Patient's spouse states that these episodes last about 15 to 20 minutes and have been happening almost on a daily basis for the past month.  She states that it usually occurs after he initially gets up to go to the kitchen.  After about 30 to 60 seconds from the time the patient stands, he begins to have episodes of dizziness and gait instability to the point he has had to hold onto the wall.  She describes this partly as as his legs moving funny.  She also states that he has what she describes as slurred speech during these episodes.  During these episodes, the patient denies any headache, visual disturbance, chest pain, shortness of breath.  There is no syncope.  He usually returns back to his usual baseline without any focal deficits after about 20 minutes.  She states that he has been started on some new medication about 1 month ago, but she does not recall the medicine. Over the past 2 months, the spouse states that the patient has had continued functional and cognitive decline.  He has not had any fevers, chills, chest pain, nausea, vomiting, diarrhea, abdominal pain, dysuria, hematuria.  Assessment/Plan: Gait instability/slurred speech -Suspect that orthostatic hypotension/transient hypotension is causing these episodes -MRI brain -EEG -Serum [id number]-Folic acid -TSH 10000000-PT evaluation -UA negative for pyuria -Request neurology evaluation  Orthostatic  hypotension -Start IV fluids -May need to tolerate higher baseline blood pressure  Essential hypertension -May need to tolerate higher baseline blood pressure in the setting of orthostasis -Holding amlodipine -Start metoprolol succinate at lower dose  CKD stage IIIb -Baseline creatinine 1.5-1.7 -Monitor  Dementia with behavioral disturbance -Continue Namenda  Thrombocytopenia -This appears to be chronic and intermittent dating back to 2018 -Work-up as above -Coags are normal  Hyperlipidemia -Continue statin  Pulmonary fibrosis -Patient follows with pulmonary, Dr. MVaughan Browner-pt will have to bring in Pirfenidone      Status is: Observation    Dispo: The patient is from: Home              Anticipated d/c is to: Home              Patient currently is not medically stable to d/c.   Difficult to place patient No        Family Communication:   spouse updated at bedside 8/2  Consultants:  neurology  Code Status:  DNR  DVT Prophylaxis:  [place] Lovenox   Procedures: As Listed in Progress Note Above  Antibiotics: None   Total time spent 35 minutes.  Greater than 50% spent face to face counseling and coordinating care.     Subjective: Patient denies fevers, chills, headache, chest pain, dyspnea, nausea, vomiting, diarrhea, abdominal pain,   Objective: Vitals:   11/27/20 0345 11/27/20 0400 11/27/20 0415 11/27/20 0430  BP: (!) 155/73 127/66 131/68 (!) 146/86  Pulse: 66 67    Resp: '15 14 17 17  '$ Temp:  TempSrc:      SpO2: 100% 100%      Intake/Output Summary (Last 24 hours) at 11/27/2020 0733 Last data filed at 11/26/2020 1900 Gross per 24 hour  Intake 500 ml  Output --  Net 500 ml   Weight change:  Exam:  General:  Pt is alert, follows commands appropriately, not in acute distress HEENT: No icterus, No thrush, No neck mass, [place]/AT Cardiovascular: RRR, S1/S2, no rubs, no gallops Respiratory:bibasilar rales. No wheeze Abdomen: Soft/+BS, non  tender, non distended, no guarding Extremities: No edema, No lymphangitis, No petechiae, No rashes, no synovitis   Data Reviewed: I have personally reviewed following labs and imaging studies Basic Metabolic Panel: Recent Labs  Lab 11/26/20 1743 11/27/20 0346  NA 137 139  K 4.1 3.7  CL 106 108  CO2 25 25  GLUCOSE 97 139*  BUN 26* 24*  CREATININE 1.69* 1.50*  CALCIUM 8.7* 9.0  MG  --  1.9  PHOS  --  2.6   Liver Function Tests: Recent Labs  Lab 11/26/20 1743 11/27/20 0346  AST 15 16  ALT 10 10  ALKPHOS 67 67  BILITOT 0.5 0.6  PROT 7.3 7.2  ALBUMIN 3.9 3.7   No results for input(s): LIPASE, AMYLASE in the last 168 hours. No results for input(s): AMMONIA in the last 168 hours. Coagulation Profile: Recent Labs  Lab 11/27/20 0346  INR 1.1   CBC: Recent Labs  Lab 11/26/20 1743 11/27/20 0346  WBC 7.7 8.6  NEUTROABS 4.9  --   HGB 12.9* 12.9*  HCT 37.9* 38.7*  MCV 97.9 98.2  PLT 147* 144*   Cardiac Enzymes: No results for input(s): CKTOTAL, CKMB, CKMBINDEX, TROPONINI in the last 168 hours. BNP: Invalid input(s): POCBNP CBG: No results for input(s): GLUCAP in the last 168 hours. HbA1C: No results for input(s): HGBA1C in the last 72 hours. Urine analysis:    Component Value Date/Time   COLORURINE YELLOW 11/26/2020 2318   APPEARANCEUR CLEAR 11/26/2020 2318   APPEARANCEUR Clear 04/11/2020 1035   LABSPEC 1.009 11/26/2020 2318   PHURINE 6.0 11/26/2020 2318   GLUCOSEU NEGATIVE 11/26/2020 2318   GLUCOSEU NEGATIVE 09/06/2012 0925   HGBUR NEGATIVE 11/26/2020 2318   BILIRUBINUR NEGATIVE 11/26/2020 2318   BILIRUBINUR Negative 04/11/2020 1035   KETONESUR NEGATIVE 11/26/2020 2318   PROTEINUR NEGATIVE 11/26/2020 2318   UROBILINOGEN 0.2 09/06/2012 0925   NITRITE NEGATIVE 11/26/2020 2318   LEUKOCYTESUR NEGATIVE 11/26/2020 2318   Sepsis Labs: '@LABRCNTIP'$ (procalcitonin:4,lacticidven:4) ) Recent Results (from the past 240 hour(s))  Resp Panel by RT-PCR (Flu A&B,  Covid) Nasopharyngeal Swab     Status: None   Collection Time: 11/26/20  5:43 PM   Specimen: Nasopharyngeal Swab; Nasopharyngeal(NP) swabs in vial transport medium  Result Value Ref Range Status   SARS Coronavirus 2 by RT PCR NEGATIVE NEGATIVE Final    Comment: (NOTE) SARS-CoV-2 target nucleic acids are NOT DETECTED.  The SARS-CoV-2 RNA is generally detectable in upper respiratory specimens during the acute phase of infection. The lowest concentration of SARS-CoV-2 viral copies this assay can detect is 138 copies/mL. A negative result does not preclude SARS-Cov-2 infection and should not be used as the sole basis for treatment or other patient management decisions. A negative result may occur with  improper specimen collection/handling, submission of specimen other than nasopharyngeal swab, presence of viral mutation(s) within the areas targeted by this assay, and inadequate number of viral copies(<138 copies/mL). A negative result must be combined with clinical observations, patient history, and epidemiological  information. The expected result is Negative.  Fact Sheet for Patients:  EntrepreneurPulse.com.au  Fact Sheet for Healthcare Providers:  IncredibleEmployment.be  This test is no t yet approved or cleared by the [place] FDA and  has been authorized for detection and/or diagnosis of SARS-CoV-2 by FDA under an Emergency Use Authorization (EUA). This EUA will remain  in effect (meaning this test can be used) for the duration of the COVID-19 declaration under Section 564(b)(1) of the Act, 21 U.S.C.section 360bbb-3(b)(1), unless the authorization is terminated  or revoked sooner.       Influenza A by PCR NEGATIVE NEGATIVE Final   Influenza B by PCR NEGATIVE NEGATIVE Final    Comment: (NOTE) The Xpert Xpress SARS-CoV-2/FLU/RSV plus assay is intended as an aid in the diagnosis of influenza from Nasopharyngeal swab specimens and should  not be used as a sole basis for treatment. Nasal washings and aspirates are unacceptable for Xpert Xpress SARS-CoV-2/FLU/RSV testing.  Fact Sheet for Patients: EntrepreneurPulse.com.au  Fact Sheet for Healthcare Providers: IncredibleEmployment.be  This test is not yet approved or cleared by the [place] FDA and has been authorized for detection and/or diagnosis of SARS-CoV-2 by FDA under an Emergency Use Authorization (EUA). This EUA will remain in effect (meaning this test can be used) for the duration of the COVID-19 declaration under Section 564(b)(1) of the Act, 21 U.S.C. section 360bbb-3(b)(1), unless the authorization is terminated or revoked.  Performed at [facility], [address] [place], [place] [address]      Scheduled Meds:  aspirin EC  81 mg Oral Daily   memantine  10 mg Oral BID   pantoprazole  40 mg Oral Daily   simvastatin  20 mg Oral QHS   Continuous Infusions:  0.9 % NaCl with KCl 20 mEq / L     lactated ringers     magnesium sulfate bolus IVPB      Procedures/Studies: DG Chest 2 View  Result Date: 11/15/2020 CLINICAL DATA:  Shortness of breath and cough slight cough for 1 year, history 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 Mediastinal contours and pulmonary vascularity normal. Emphysematous and bronchitic changes consistent with COPD. Bibasilar atelectasis with nodular density at LEFT lung base 10 mm diameter, not seen on previous exam but noted on 09/11/2016 study, likely nipple shadow. No acute infiltrate, pleural effusion, or pneumothorax. Bones demineralized with chronic compression deformity at the thoracolumbar junction. IMPRESSION: COPD changes with bibasilar atelectasis versus scarring. Emphysema (ICD10-J43.9). Electronically Signed   By: Lavonia Dana M.D.   On: 11/15/2020 10:48   CT Head Wo  Contrast  Result Date: 11/26/2020 CLINICAL DATA:  Mental status change, unknown cause Patient reports dizziness with multiple falls. EXAM: CT HEAD WITHOUT CONTRAST TECHNIQUE: Contiguous axial images were obtained from the base of the skull through the vertex without intravenous contrast. COMPARISON:  Head CT 02/18/2020 FINDINGS: Brain: No evidence of acute infarction, hemorrhage, hydrocephalus, extra-axial collection or mass lesion/mass effect. Stable brain volume and mild periventricular chronic small vessel ischemia. 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 Vascular: Atherosclerosis of skullbase vasculature without hyperdense vessel or abnormal calcification. Skull: No fracture or focal lesion. Sinuses/Orbits: Paranasal sinuses and mastoid air cells are clear. The visualized orbits are unremarkable. Bilateral cataract resection. Other: None. IMPRESSION: 1. No acute intracranial abnormality. 2. Stable chronic small vessel ischemia. 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 Electronically Signed   By: Threasa Beards  Sanford M.D.   On: 11/26/2020 18:25   DG Chest Portable 1 View  Result Date: 11/26/2020 CLINICAL DATA:  Chest pain, near syncope EXAM: PORTABLE CHEST 1 VIEW COMPARISON:  11/14/2020 FINDINGS: Rotated AP portable examination with scarring and/or fibrosis at the bilateral lung bases. No acute appearing airspace opacity. Cardiomegaly status post median sternotomy and CABG. IMPRESSION: Rotated AP portable examination with scarring and/or fibrosis at the bilateral lung bases. No acute appearing airspace opacity. Electronically Signed   By: Eddie Candle M.D.   On: 11/26/2020 18:00    Orson Eva, DO  [facility]  If 7PM-7AM, please contact night-coverage [email or web address] Password [email or web address] 11/27/2020, 7:33 AM   LOS: 0 days

## 2020-11-27 NOTE — ED Notes (Signed)
Patient transported to MRI 

## 2020-11-27 NOTE — ED Notes (Signed)
Patient repositioned in bed and sat up in bed. Patient's belongings within reach. Patient's wife left to go home for a little while.

## 2020-11-27 NOTE — ED Notes (Signed)
Dr. Carles Collet hospitalist's at bedside

## 2020-11-27 NOTE — Progress Notes (Signed)
OT Cancellation Note  Patient Details Name: Isaac [name] MRN: [id number] DOB: Jun 10, 1940   Cancelled Treatment:    Reason Eval/Treat Not Completed: Patient at procedure or test/ unavailable. Pt having procedure done in room when this OT arrived for evaluation. Will attempt to see pt later as time permits.   Zali Kamaka OT, MOT   Larey Seat 11/27/2020, 11:55 AM

## 2020-11-28 DIAGNOSIS — K219 Gastro-esophageal reflux disease without esophagitis: Secondary | ICD-10-CM | POA: Diagnosis not present

## 2020-11-28 DIAGNOSIS — I951 Orthostatic hypotension: Secondary | ICD-10-CM | POA: Diagnosis not present

## 2020-11-28 DIAGNOSIS — I10 Essential (primary) hypertension: Secondary | ICD-10-CM | POA: Diagnosis not present

## 2020-11-28 DIAGNOSIS — N1832 Chronic kidney disease, stage 3b: Secondary | ICD-10-CM | POA: Diagnosis not present

## 2020-11-28 MED ORDER — AMLODIPINE BESYLATE 5 MG PO TABS
2.5000 mg | ORAL_TABLET | Freq: Every day | ORAL | Status: DC
  Administered 2020-11-28 – 2020-12-03 (×6): 2.5 mg via ORAL
  Filled 2020-11-28 (×6): qty 1

## 2020-11-28 MED ORDER — METOPROLOL SUCCINATE ER 50 MG PO TB24
50.0000 mg | ORAL_TABLET | Freq: Every day | ORAL | Status: DC
  Administered 2020-11-28 – 2020-12-03 (×6): 50 mg via ORAL
  Filled 2020-11-28 (×6): qty 1

## 2020-11-28 MED ORDER — MOMETASONE FURO-FORMOTEROL FUM 200-5 MCG/ACT IN AERO
2.0000 | INHALATION_SPRAY | Freq: Two times a day (BID) | RESPIRATORY_TRACT | Status: DC
  Administered 2020-11-28 – 2020-12-03 (×6): 2 via RESPIRATORY_TRACT
  Filled 2020-11-28 (×2): qty 8.8

## 2020-11-28 NOTE — Care Management Obs Status (Signed)
[place] NOTIFICATION   Patient Details  Name: Isaac [name] MRN: [id number] Date of Birth: 28-Feb-1941   Medicare Observation Status Notification Given:  Yes    [name] 11/28/2020, 2:12 PM

## 2020-11-28 NOTE — Progress Notes (Signed)
Pt woke up trying to leave bed to use bathroom, pt was instructed that he has a condom cath and he can urinate freely. Pt. Was no compliant with staff and became physically aggressive. Pt given IV ativan with some relief of symptoms of agitation. Louanne Skye 11/28/20 6:58 AM

## 2020-11-28 NOTE — Progress Notes (Signed)
Physical Therapy Treatment Patient Details Name: Isaac [name] MRN: [id number] DOB: 1940-06-14 Today's Date: 11/28/2020    History of Present Illness Isaac [name] is a 80 y.o. male with medical history significant for hypertension, hyperlipidemia, GERD, CAD s/p CABG on aspirin, BPH, CKD stage 3, hearing impairment, Alzheimer's dementia who presents to the emergency department due to episodes of ambulatory difficulty in which patient was holding onto walls as well as 1 month of repetitive movements of right foot and lightheadedness usually when walking or standing.  He presents with slurred speech which lasted about 20 minutes prior to returning to baseline.Patient also complained of dizziness and blurry vision when he stands up from sitting position. He ambulates with a cane at baseline.    PT Comments    Arrived in room with sister present in room.  Pt laying in bed with eyes open though verbalized he was agreeable to complete therapy.  Pt with soft slurred speech that sister was able to assist with response.  Increased assistance required with bed mobility, transfer and balance this session.  No gait training complete this session due to unsteadiness and unsafe, did complete sidestepping infront of bed.  Upon return to sitting pt presents with increased leaning to the left and required moderate assistance to return to bed.  Vitals taken with noted increased BP and low O2 saturation.  RN aware.  EOS pt left in bed with call bell within reach, bed alarm set and sister by side.      Follow Up Recommendations  Home health PT;Supervision for mobility/OOB;Supervision - Intermittent     Equipment Recommendations  None recommended by PT    Recommendations for Other Services       Precautions / Restrictions      Mobility  Bed Mobility Overal bed mobility: Needs Assistance Bed Mobility: Supine to Sit     Supine to sit: Min assist     General bed mobility comments: Cueing for handplacement  to assist wiht rolling    Transfers Overall transfer level: Needs assistance Equipment used: Rolling walker (2 wheeled) Transfers: Sit to/from Stand   Stand pivot transfers: Min assist;Mod assist       General transfer comment: Multiple cues for handplacement prior standing, unsteady on feet  Ambulation/Gait Ambulation/Gait assistance: Min assist     Gait Pattern/deviations: Decreased step length - right;Decreased step length - left;Decreased stride length;Drifts right/left Gait velocity: decreased   General Gait Details: unsteady and required constant cueing to open eyes, sidestep complete 2RT front of bed as unsafe to ambulate due to lethergic   Stairs             Wheelchair Mobility    Modified Rankin (Stroke Patients Only)       Balance                                            Cognition Arousal/Alertness: Lethargic Behavior During Therapy: Impulsive Overall Cognitive Status: Impaired/Different from baseline Area of Impairment: Following commands;Safety/judgement                       Following Commands: Follows one step commands inconsistently Safety/Judgement: Decreased awareness of deficits;Decreased awareness of safety            Exercises      General Comments        Pertinent Vitals/Pain Pain Assessment:  0-10 Pain Score: 0-No pain Pain Location: sore throat Pain Intervention(s): Monitored during session    Home Living                      Prior Function            PT Goals (current goals can now be found in the care plan section)      Frequency    Min 3X/week      PT Plan      Co-evaluation              AM-PAC PT "6 Clicks" Mobility   Outcome Measure  Help needed turning from your back to your side while in a flat bed without using bedrails?: A Little Help needed moving from lying on your back to sitting on the side of a flat bed without using bedrails?: A Little Help  needed moving to and from a bed to a chair (including a wheelchair)?: A Lot Help needed standing up from a chair using your arms (e.g., wheelchair or bedside chair)?: A Little Help needed to walk in hospital room?: A Lot Help needed climbing 3-5 steps with a railing? : Total 6 Click Score: 14    End of Session Equipment Utilized During Treatment: Gait belt Activity Tolerance: Patient limited by lethargy;Patient limited by fatigue Patient left: in bed;with family/visitor present;with call bell/phone within reach;with bed alarm set Nurse Communication: Mobility status PT Visit Diagnosis: Unsteadiness on feet (R26.81);Other abnormalities of gait and mobility (R26.89);Difficulty in walking, not elsewhere classified (R26.2)     Time: [id number] PT Time Calculation (min) (ACUTE ONLY): 27 min  Charges:  $Therapeutic Activity: 23-37 mins                    Ihor Austin, LPTA/CLT; CBIS [phone]   Aldona Lento 11/28/2020, 6:52 PM

## 2020-11-28 NOTE — Progress Notes (Signed)
PROGRESS NOTE  Isaac [name] [id number] DOB: December 15, 1940 DOA: 11/26/2020 PCP: Dettinger, Fransisca Kaufmann, MD  Brief History:  80 year old male with a history of dementia, CAD, CKD stage III, diastolic CHF, hypertension, hyperlipidemia , and pulmonary fibrosis presenting with 13-monthhistory of transient episodes of dizziness, gait instability, and slurred speech.  The patient is a poor historian secondary to his dementia.  History is obtained from review of the medical record and speaking with the patient spouse at the bedside.  Patient's spouse states that these episodes last about 15 to 20 minutes and have been happening almost on a daily basis for the past month.  She states that it usually occurs after he initially gets up to go to the kitchen.  After about 30 to 60 seconds from the time the patient stands, he begins to have episodes of dizziness and gait instability to the point he has had to hold onto the wall.  She describes this partly as as his legs moving funny.  She also states that he has what she describes as slurred speech during these episodes.  During these episodes, the patient denies any headache, visual disturbance, chest pain, shortness of breath.  There is no syncope.  He usually returns back to his usual baseline without any focal deficits after about 20 minutes.  She states that he has been started on some new medication about 1 month ago, but she does not recall the medicine. Over the past 2 months, the spouse states that the patient has had continued functional and cognitive decline.  He has not had any fevers, chills, chest pain, nausea, vomiting, diarrhea, abdominal pain, dysuria, hematuria.  Assessment/Plan: Gait instability/slurred speech -Suspect that orthostatic hypotension/transient hypotension is causing these episodes -MRI brain without any acute findings of stroke -EEG without any signs of seizures -Serum B12, low, replace -Folic acid -TSH 10000000-PT evaluation  currently recommending home health with supervision, will assess to reassess since it is only patient and his wife currently at home -UA negative for pyuria -Seen by neurology who thought symptoms may be related to progression of dementia/sundowning -Currently, appears to be somnolent/agitated and not at baseline  Orthostatic hypotension -Start IV fluids -May need to tolerate higher baseline blood pressure  Essential hypertension -Blood pressures have been trending up -Restart metoprolol and amlodipine  CKD stage IIIb -Baseline creatinine 1.5-1.7 -Monitor  Dementia with behavioral disturbance -Continue Namenda  Thrombocytopenia -This appears to be chronic and intermittent dating back to 2018 -Work-up as above -Coags are normal  Hyperlipidemia -Continue statin  Pulmonary fibrosis -Patient follows with pulmonary, Dr. MVaughan Browner-pt will have to bring in Pirfenidone  Disposition -Patient's wife reports that it is only her and the patient who lives in their home -We will ask physical therapy to reassess regarding safety of discharge home     Status is: Observation    Dispo: The patient is from: Home              Anticipated d/c is to: Home              Patient currently is not medically stable to d/c.   Difficult to place patient No        Family Communication:   spouse updated at bedside 8/3  Consultants:  neurology  Code Status:  DNR  DVT Prophylaxis:  [place] Lovenox   Procedures: As Listed in Progress Note Above  Antibiotics: None   Total time spent 35  minutes.  Greater than 50% spent face to face counseling and coordinating care.     Subjective: Noted to be agitated overnight.  Required sedative medications.  He has been trying to get out of bed today.  Family currently at bedside  Objective: Vitals:   11/27/20 2031 11/28/20 0700 11/28/20 1522 11/28/20 1542  BP: (!) 162/93 (!) 150/71 (!) 175/84 (!) 172/84  Pulse: 78 68 79 72  Resp: 18 18     Temp: 98.4 F (36.9 C) 98.5 F (36.9 C)    TempSrc: Oral Oral    SpO2: 93% 100% (!) 87% 100%  Weight:      Height:        Intake/Output Summary (Last 24 hours) at 11/28/2020 1836 Last data filed at 11/28/2020 0400 Gross per 24 hour  Intake 900 ml  Output --  Net 900 ml   Weight change:  Exam:  General exam: Patient has been agitated today, speech is not appear to be coherent.  Required medications Respiratory system: Clear to auscultation. Respiratory effort normal. Cardiovascular system:RRR. No murmurs, rubs, gallops. Gastrointestinal system: Abdomen is nondistended, soft and nontender. No organomegaly or masses felt. Normal bowel sounds heard. Central nervous system: No focal neurological deficits. Extremities: No C/C/E, +pedal pulses Skin: No rashes, lesions or ulcers Psychiatry: Agitated, confused    Data Reviewed: I have personally reviewed following labs and imaging studies Basic Metabolic Panel: Recent Labs  Lab 11/26/20 1743 11/27/20 0346  NA 137 139  K 4.1 3.7  CL 106 108  CO2 25 25  GLUCOSE 97 139*  BUN 26* 24*  CREATININE 1.69* 1.50*  CALCIUM 8.7* 9.0  MG  --  1.9  PHOS  --  2.6   Liver Function Tests: Recent Labs  Lab 11/26/20 1743 11/27/20 0346  AST 15 16  ALT 10 10  ALKPHOS 67 67  BILITOT 0.5 0.6  PROT 7.3 7.2  ALBUMIN 3.9 3.7   No results for input(s): LIPASE, AMYLASE in the last 168 hours. No results for input(s): AMMONIA in the last 168 hours. Coagulation Profile: Recent Labs  Lab 11/27/20 0346  INR 1.1   CBC: Recent Labs  Lab 11/26/20 1743 11/27/20 0346  WBC 7.7 8.6  NEUTROABS 4.9  --   HGB 12.9* 12.9*  HCT 37.9* 38.7*  MCV 97.9 98.2  PLT 147* 144*   Cardiac Enzymes: Recent Labs  Lab 11/27/20 0346  CKTOTAL 38*   BNP: Invalid input(s): POCBNP CBG: No results for input(s): GLUCAP in the last 168 hours. HbA1C: Recent Labs    11/27/20 0346  HGBA1C 5.4   Urine analysis:    Component Value Date/Time    COLORURINE YELLOW 11/26/2020 2318   APPEARANCEUR CLEAR 11/26/2020 2318   APPEARANCEUR Clear 04/11/2020 1035   LABSPEC 1.009 11/26/2020 2318   PHURINE 6.0 11/26/2020 2318   GLUCOSEU NEGATIVE 11/26/2020 2318   GLUCOSEU NEGATIVE 09/06/2012 0925   HGBUR NEGATIVE 11/26/2020 2318   BILIRUBINUR NEGATIVE 11/26/2020 2318   BILIRUBINUR Negative 04/11/2020 1035   KETONESUR NEGATIVE 11/26/2020 2318   PROTEINUR NEGATIVE 11/26/2020 2318   UROBILINOGEN 0.2 09/06/2012 0925   NITRITE NEGATIVE 11/26/2020 2318   LEUKOCYTESUR NEGATIVE 11/26/2020 2318   Sepsis Labs: '@LABRCNTIP'$ (procalcitonin:4,lacticidven:4) ) Recent Results (from the past 240 hour(s))  Resp Panel by RT-PCR (Flu A&B, Covid) Nasopharyngeal Swab     Status: None   Collection Time: 11/26/20  5:43 PM   Specimen: Nasopharyngeal Swab; Nasopharyngeal(NP) swabs in vial transport medium  Result Value Ref Range Status   SARS  Coronavirus 2 by RT PCR NEGATIVE NEGATIVE Final    Comment: (NOTE) SARS-CoV-2 target nucleic acids are NOT DETECTED.  The SARS-CoV-2 RNA is generally detectable in upper respiratory specimens during the acute phase of infection. The lowest concentration of SARS-CoV-2 viral copies this assay can detect is 138 copies/mL. A negative result does not preclude SARS-Cov-2 infection and should not be used as the sole basis for treatment or other patient management decisions. A negative result may occur with  improper specimen collection/handling, submission of specimen other than nasopharyngeal swab, presence of viral mutation(s) within the areas targeted by this assay, and inadequate number of viral copies(<138 copies/mL). A negative result must be combined with clinical observations, patient history, and epidemiological information. The expected result is Negative.  Fact Sheet for Patients:  EntrepreneurPulse.com.au  Fact Sheet for Healthcare Providers:  IncredibleEmployment.be  This  test is no t yet approved or cleared by the [place] FDA and  has been authorized for detection and/or diagnosis of SARS-CoV-2 by FDA under an Emergency Use Authorization (EUA). This EUA will remain  in effect (meaning this test can be used) for the duration of the COVID-19 declaration under Section 564(b)(1) of the Act, 21 U.S.C.section 360bbb-3(b)(1), unless the authorization is terminated  or revoked sooner.       Influenza A by PCR NEGATIVE NEGATIVE Final   Influenza B by PCR NEGATIVE NEGATIVE Final    Comment: (NOTE) The Xpert Xpress SARS-CoV-2/FLU/RSV plus assay is intended as an aid in the diagnosis of influenza from Nasopharyngeal swab specimens and should not be used as a sole basis for treatment. Nasal washings and aspirates are unacceptable for Xpert Xpress SARS-CoV-2/FLU/RSV testing.  Fact Sheet for Patients: EntrepreneurPulse.com.au  Fact Sheet for Healthcare Providers: IncredibleEmployment.be  This test is not yet approved or cleared by the [place] FDA and has been authorized for detection and/or diagnosis of SARS-CoV-2 by FDA under an Emergency Use Authorization (EUA). This EUA will remain in effect (meaning this test can be used) for the duration of the COVID-19 declaration under Section 564(b)(1) of the Act, 21 U.S.C. section 360bbb-3(b)(1), unless the authorization is terminated or revoked.  Performed at [facility], [address] [place], [place] [address]      Scheduled Meds:  amLODipine  2.5 mg Oral Daily   aspirin EC  81 mg Oral Daily   cyanocobalamin  1,000 mcg Intramuscular 1 day or 1 dose   memantine  10 mg Oral BID   metoprolol succinate  50 mg Oral Daily   mometasone-formoterol  2 puff Inhalation BID   OLANZapine  5 mg Oral QHS   pantoprazole  40 mg Oral Daily   simvastatin  20 mg Oral QHS   Continuous Infusions:    Procedures/Studies: DG Chest 2 View  Result Date: 11/15/2020 CLINICAL  DATA:  Shortness of breath and cough slight cough for 1 year, history 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 Mediastinal contours and pulmonary vascularity normal. Emphysematous and bronchitic changes consistent with COPD. Bibasilar atelectasis with nodular density at LEFT lung base 10 mm diameter, not seen on previous exam but noted on 09/11/2016 study, likely nipple shadow. No acute infiltrate, pleural effusion, or pneumothorax. Bones demineralized with chronic compression deformity at the thoracolumbar junction. IMPRESSION: COPD changes with bibasilar atelectasis versus scarring. Emphysema (ICD10-J43.9). Electronically Signed   By: Lavonia Dana M.D.   On: 11/15/2020 10:48   CT Head  Wo Contrast  Result Date: 11/26/2020 CLINICAL DATA:  Mental status change, unknown cause Patient reports dizziness with multiple falls. EXAM: CT HEAD WITHOUT CONTRAST TECHNIQUE: Contiguous axial images were obtained from the base of the skull through the vertex without intravenous contrast. COMPARISON:  Head CT 02/18/2020 FINDINGS: Brain: No evidence of acute infarction, hemorrhage, hydrocephalus, extra-axial collection or mass lesion/mass effect. Stable brain volume and mild periventricular chronic small vessel ischemia. 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 Vascular: Atherosclerosis of skullbase vasculature without hyperdense vessel or abnormal calcification. Skull: No fracture or focal lesion. Sinuses/Orbits: Paranasal sinuses and mastoid air cells are clear. The visualized orbits are unremarkable. Bilateral cataract resection. Other: None. IMPRESSION: 1. No acute intracranial abnormality. 2. Stable chronic small vessel ischemia. 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 Electronically Signed   By: Keith Rake M.D.   On: 11/26/2020 18:25   MR ANGIO HEAD WO CONTRAST  Result Date:  11/27/2020 CLINICAL DATA:  TIA; episodes of weakness in both legs EXAM: MRI HEAD WITHOUT CONTRAST MRA HEAD WITHOUT CONTRAST MRA NECK WITHOUT AND WITH CONTRAST TECHNIQUE: Multiplanar, multi-echo pulse sequences of the brain and surrounding structures were acquired without intravenous contrast. Angiographic images of the Circle of Willis were acquired using MRA technique without intravenous contrast. Angiographic images of the neck were acquired using MRA technique without and with intravenous contrast. Carotid stenosis measurements (when applicable) are obtained utilizing NASCET criteria, using the distal internal carotid diameter as the denominator. CONTRAST:  42m GADAVIST GADOBUTROL 1 MMOL/ML IV SOLN COMPARISON:  10/19/2019 FINDINGS: MRI HEAD Motion artifact is present. Brain: There is no acute infarction or intracranial hemorrhage. There is no intracranial mass, mass effect, or edema. There is no hydrocephalus or extra-axial fluid collection. Patchy T2 hyperintensity in the supratentorial white matter is nonspecific but probably reflects mild to moderate chronic microvascular ischemic changes. Prominence of the ventricles and sulci reflects minor generalized parenchymal volume loss. 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 Multiple small chronic cerebellar infarcts. Vascular: Major vessel flow voids at the skull base are preserved. Skull and upper cervical spine: Normal marrow signal is preserved. Sinuses/Orbits: Minor mucosal thickening. Bilateral lens replacements. Other: Sella is unremarkable.  Mastoid air cells are clear. MRA HEAD Intracranial internal carotid arteries are patent. Middle and anterior cerebral arteries are patent. Intracranial vertebral arteries, basilar artery, posterior cerebral arteries are patent. There is no significant stenosis or aneurysm. MRA NECK Great vessel origins are patent. Common, internal, and external  carotid arteries are patent. Extracranial codominant vertebral arteries are patent. There is no hemodynamically significant stenosis. Atherosclerotic irregularity at the left greater than right ICA origins. IMPRESSION: No acute infarction, hemorrhage, or mass. 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 No large vessel occlusion, hemodynamically significant stenosis, or evidence of dissection. Electronically Signed   By: PMacy MisM.D.   On: 11/27/2020 09:17   MR Angiogram Neck W or Wo Contrast  Result Date: 11/27/2020 CLINICAL DATA:  TIA; episodes of weakness in both legs EXAM: MRI HEAD WITHOUT CONTRAST MRA HEAD WITHOUT CONTRAST MRA NECK WITHOUT AND WITH CONTRAST TECHNIQUE: Multiplanar, multi-echo pulse sequences of the brain and surrounding structures were acquired without intravenous contrast. Angiographic images of the Circle of Willis were acquired using MRA technique without intravenous contrast. Angiographic images of the neck were acquired using MRA technique without and with intravenous contrast. Carotid stenosis measurements (when applicable) are obtained utilizing NASCET criteria, using the distal internal carotid diameter  as the denominator. CONTRAST:  61m GADAVIST GADOBUTROL 1 MMOL/ML IV SOLN COMPARISON:  10/19/2019 FINDINGS: MRI HEAD Motion artifact is present. Brain: There is no acute infarction or intracranial hemorrhage. There is no intracranial mass, mass effect, or edema. There is no hydrocephalus or extra-axial fluid collection. Patchy T2 hyperintensity in the supratentorial white matter is nonspecific but probably reflects mild to moderate chronic microvascular ischemic changes. Prominence of the ventricles and sulci reflects minor generalized parenchymal volume loss. 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 Multiple small chronic cerebellar infarcts. Vascular: Major vessel flow voids at the skull  base are preserved. Skull and upper cervical spine: Normal marrow signal is preserved. Sinuses/Orbits: Minor mucosal thickening. Bilateral lens replacements. Other: Sella is unremarkable.  Mastoid air cells are clear. MRA HEAD Intracranial internal carotid arteries are patent. Middle and anterior cerebral arteries are patent. Intracranial vertebral arteries, basilar artery, posterior cerebral arteries are patent. There is no significant stenosis or aneurysm. MRA NECK Great vessel origins are patent. Common, internal, and external carotid arteries are patent. Extracranial codominant vertebral arteries are patent. There is no hemodynamically significant stenosis. Atherosclerotic irregularity at the left greater than right ICA origins. IMPRESSION: No acute infarction, hemorrhage, or mass. 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 No large vessel occlusion, hemodynamically significant stenosis, or evidence of dissection. Electronically Signed   By: PMacy MisM.D.   On: 11/27/2020 09:17   MR BRAIN WO CONTRAST  Result Date: 11/27/2020 CLINICAL DATA:  TIA; episodes of weakness in both legs EXAM: MRI HEAD WITHOUT CONTRAST MRA HEAD WITHOUT CONTRAST MRA NECK WITHOUT AND WITH CONTRAST TECHNIQUE: Multiplanar, multi-echo pulse sequences of the brain and surrounding structures were acquired without intravenous contrast. Angiographic images of the Circle of Willis were acquired using MRA technique without intravenous contrast. Angiographic images of the neck were acquired using MRA technique without and with intravenous contrast. Carotid stenosis measurements (when applicable) are obtained utilizing NASCET criteria, using the distal internal carotid diameter as the denominator. CONTRAST:  73mGADAVIST GADOBUTROL 1 MMOL/ML IV SOLN COMPARISON:  10/19/2019 FINDINGS: MRI HEAD Motion artifact is present. Brain: There is no acute infarction or intracranial hemorrhage. There is no intracranial mass, mass  effect, or edema. There is no hydrocephalus or extra-axial fluid collection. Patchy T2 hyperintensity in the supratentorial white matter is nonspecific but probably reflects mild to moderate chronic microvascular ischemic changes. Prominence of the ventricles and sulci reflects minor generalized parenchymal volume loss. 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 Multiple small chronic cerebellar infarcts. Vascular: Major vessel flow voids at the skull base are preserved. Skull and upper cervical spine: Normal marrow signal is preserved. Sinuses/Orbits: Minor mucosal thickening. Bilateral lens replacements. Other: Sella is unremarkable.  Mastoid air cells are clear. MRA HEAD Intracranial internal carotid arteries are patent. Middle and anterior cerebral arteries are patent. Intracranial vertebral arteries, basilar artery, posterior cerebral arteries are patent. There is no significant stenosis or aneurysm. MRA NECK Great vessel origins are patent. Common, internal, and external carotid arteries are patent. Extracranial codominant vertebral arteries are patent. There is no hemodynamically significant stenosis. Atherosclerotic irregularity at the left greater than right ICA origins. IMPRESSION: No acute infarction, hemorrhage, or mass. 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 No large vessel occlusion, hemodynamically significant stenosis, or evidence of dissection. Electronically Signed   By: PrMacy Mis.D.   On: 11/27/2020  09:17   DG Chest Portable 1 View  Result Date: 11/26/2020 CLINICAL DATA:  Chest pain, near syncope EXAM: PORTABLE CHEST 1 VIEW COMPARISON:  11/14/2020 FINDINGS: Rotated AP portable examination with scarring and/or fibrosis at the bilateral lung bases. No acute appearing airspace opacity. Cardiomegaly status post median sternotomy and CABG. IMPRESSION: Rotated AP portable examination with scarring  and/or fibrosis at the bilateral lung bases. No acute appearing airspace opacity. Electronically Signed   By: Eddie Candle M.D.   On: 11/26/2020 18:00   EEG adult  Result Date: 11/27/2020 Lora Havens, MD     11/27/2020 12:21 PM Patient Name: Isaac [name] MRN: [id number] Epilepsy Attending: Lora Havens Referring Physician/Provider: Dr Bernadette Hoit Date: 11/27/2020 Duration: 24.10 mins Patient history: 80yo M with dizziness. EEG to evaluate for seizure Level of alertness: Awake AEDs during EEG study: None Technical aspects: This EEG study was done with scalp electrodes positioned according to the 10-20 International system of electrode placement. Electrical activity was acquired at a sampling rate of '500Hz'$  and reviewed with a high frequency filter of '70Hz'$  and a low frequency filter of '1Hz'$ . EEG data were recorded continuously and digitally stored. Description: The posterior dominant rhythm consists of '6Hz'$  activity of moderate voltage (25-35 uV) seen predominantly in posterior head regions, symmetric and reactive to eye opening and eye closing. Hyperventilation and photic stimulation were not performed.   ABNORMALITY - Background slow IMPRESSION: This study is suggestive of mild diffuse encephalopathy, nonspecific etiology. No seizures or epileptiform discharges were seen throughout the recording. Lora Havens   ECHOCARDIOGRAM COMPLETE  Result Date: 11/27/2020    ECHOCARDIOGRAM REPORT   Patient Name:   Isaac [name] Date of Exam: 11/27/2020 Medical Rec #:  [id number]     Height:       70.5 in Accession #:    [id number]    Weight:       148.0 lb Date of Birth:  1941-02-20     BSA:          1.846 m Patient Age:    58 years      BP:           146/86 mmHg Patient Gender: M             HR:           67 bpm. Exam Location:  [facility] Procedure: 2D Echo, Cardiac Doppler and Color Doppler Indications:    Stroke  History:        Patient has prior history of Echocardiogram examinations, most                  recent 09/08/2019. CHF, CAD, Prior CABG, Stroke,                 Signs/Symptoms:Dyspnea and Chest Pain; Risk Factors:Hypertension                 and Dyslipidemia.  Sonographer:    Wenda Low Referring Phys: [id number] OLADAPO ADEFESO IMPRESSIONS  1. Left ventricular ejection fraction, by estimation, is 55 to 60%. The left ventricle has normal function. The left ventricle demonstrates regional wall motion abnormalities (see scoring diagram/findings for description). There is mild left ventricular  hypertrophy. Left ventricular diastolic parameters are consistent with Grade I diastolic dysfunction (impaired relaxation).  2. Right ventricular systolic function is normal. The right ventricular size is normal. There is normal pulmonary artery systolic pressure. The estimated right ventricular systolic pressure is 123456 mmHg.  3. Left  atrial size was moderately dilated.  4. The mitral valve is degenerative. Moderate mitral valve regurgitation.  5. The aortic valve is tricuspid. Aortic valve regurgitation is mild to moderate. Aortic regurgitation PHT measures 552 msec. Aortic valve mean gradient measures 2.0 mmHg.  6. The inferior vena cava is normal in size with greater than 50% respiratory variability, suggesting right atrial pressure of 3 mmHg. Comparison(s): Prior images reviewed side by side. Inferoposterior wall motion abnormalitiy present on prior study. Mitral regurgitation is moderate at this point. FINDINGS  Left Ventricle: Left ventricular ejection fraction, by estimation, is 55 to 60%. The left ventricle has normal function. The left ventricle demonstrates regional wall motion abnormalities. The left ventricular internal cavity size was normal in size. There is mild left ventricular hypertrophy. Left ventricular diastolic parameters are consistent with Grade I diastolic dysfunction (impaired relaxation).  LV Wall Scoring: The basal inferolateral segment, basal anterolateral segment, and basal inferior segment  are akinetic. The entire anterior wall, mid and distal lateral wall, entire septum, entire apex, mid and distal inferior wall, and mid anterolateral segment are normal. Right Ventricle: The right ventricular size is normal. No increase in right ventricular wall thickness. Right ventricular systolic function is normal. There is normal pulmonary artery systolic pressure. The tricuspid regurgitant velocity is 2.32 m/s, and  with an assumed right atrial pressure of 3 mmHg, the estimated right ventricular systolic pressure is 123456 mmHg. Left Atrium: Left atrial size was moderately dilated. Right Atrium: Right atrial size was normal in size. Pericardium: There is no evidence of pericardial effusion. Mitral Valve: The mitral valve is degenerative in appearance. There is mild thickening of the mitral valve leaflet(s). Mild mitral annular calcification. Moderate mitral valve regurgitation, with eccentric posteriorly directed jet. MV peak gradient, 3.0 mmHg. The mean mitral valve gradient is 1.0 mmHg. Tricuspid Valve: The tricuspid valve is grossly normal. Tricuspid valve regurgitation is trivial. Aortic Valve: The aortic valve is tricuspid. There is mild aortic valve annular calcification. Aortic valve regurgitation is mild to moderate. Aortic regurgitation PHT measures 552 msec. Aortic valve mean gradient measures 2.0 mmHg. Aortic valve peak gradient measures 4.4 mmHg. Aortic valve area, by VTI measures 2.92 cm. Pulmonic Valve: The pulmonic valve was grossly normal. Pulmonic valve regurgitation is trivial. Aorta: The aortic root is normal in size and structure. Venous: The inferior vena cava is normal in size with greater than 50% respiratory variability, suggesting right atrial pressure of 3 mmHg. IAS/Shunts: No atrial level shunt detected by color flow Doppler.  LEFT VENTRICLE PLAX 2D LVIDd:         5.18 cm      Diastology LVIDs:         2.86 cm      LV e' medial:    4.64 cm/s LV PW:         1.35 cm      LV E/e' medial:   9.5 LV IVS:        1.09 cm      LV e' lateral:   5.78 cm/s LVOT diam:     2.00 cm      LV E/e' lateral: 7.6 LV SV:         76 LV SV Index:   41 LVOT Area:     3.14 cm  LV Volumes (MOD) LV vol d, MOD A2C: 59.4 ml LV vol d, MOD A4C: 114.0 ml LV vol s, MOD A2C: 41.7 ml LV vol s, MOD A4C: 50.4 ml LV SV MOD A2C:  17.7 ml LV SV MOD A4C:     114.0 ml LV SV MOD BP:      37.1 ml RIGHT VENTRICLE RV Basal diam:  3.39 cm RV Mid diam:    2.85 cm RV S prime:     12.10 cm/s TAPSE (M-mode): 2.2 cm LEFT ATRIUM             Index       RIGHT ATRIUM           Index LA diam:        4.70 cm 2.55 cm/m  RA Area:     19.40 cm LA Vol (A2C):   69.6 ml 37.70 ml/m RA Volume:   48.80 ml  26.43 ml/m LA Vol (A4C):   80.2 ml 43.44 ml/m LA Biplane Vol: 75.7 ml 41.00 ml/m  AORTIC VALVE AV Area (Vmax):    2.62 cm AV Area (Vmean):   2.53 cm AV Area (VTI):     2.92 cm AV Vmax:           105.00 cm/s AV Vmean:          72.100 cm/s AV VTI:            0.259 m AV Peak Grad:      4.4 mmHg AV Mean Grad:      2.0 mmHg LVOT Vmax:         87.70 cm/s LVOT Vmean:        58.000 cm/s LVOT VTI:          0.241 m LVOT/AV VTI ratio: 0.93 AI PHT:            552 msec  AORTA Ao Root diam: 3.20 cm Ao Asc diam:  3.50 cm MITRAL VALVE               TRICUSPID VALVE MV Area (PHT): 3.15 cm    TR Peak grad:   21.5 mmHg MV Area VTI:   4.32 cm    TR Vmax:        232.00 cm/s MV Peak grad:  3.0 mmHg MV Mean grad:  1.0 mmHg    SHUNTS MV Vmax:       0.86 m/s    Systemic VTI:  0.24 m MV Vmean:      39.6 cm/s   Systemic Diam: 2.00 cm MV Decel Time: 241 msec MV E velocity: 44.20 cm/s MV A velocity: 62.70 cm/s MV E/A ratio:  0.70 Rozann Lesches MD Electronically signed by Rozann Lesches MD Signature Date/Time: 11/27/2020/11:28:33 AM    Final     Kathie Dike, MD  [facility]  If 7PM-7AM, please contact night-coverage [email or web address]  11/28/2020, 6:36 PM   LOS: 0 days

## 2020-11-28 NOTE — Progress Notes (Signed)
OT Cancellation Note  Patient Details Name: Isaac [name] MRN: [id number] DOB: 04-07-1941   Cancelled Treatment:    Reason Eval/Treat Not Completed: Fatigue/lethargy limiting ability to participate. Pt received doe of Ativan early this AM due to aggravation. Sleeping soundly when OT evaluation was attempted. Unable to complete OT eval at this time. Will re-attempt at a later time if patient has not been discharged. Planned discharge is 8/3 at this time.      Ailene Ravel, OTR/L,CBIS  [phone]  11/28/2020, 12:23 PM

## 2020-11-28 NOTE — Progress Notes (Signed)
Pt became agitated and combative. Was swinging at staff and refusing to stay in bed after informing him of having a catheter for urinary output. PRN haldol given with positive results. 11/28/20  2:00 AM

## 2020-11-28 NOTE — Telephone Encounter (Addendum)
Provider portion returned to pharmacy and placed in "Awaiting Response" folder. Mailing patient portion to home address for completion. Will continue to f/u.

## 2020-11-28 NOTE — Progress Notes (Signed)
Not able to complete orthostatics v/s this shift due to pts confusion and not following directions.

## 2020-11-28 NOTE — TOC Initial Note (Signed)
Transition of Care [facility]) - Initial/Assessment Note    Patient Details  Name: Isaac [name] MRN: [id number] Date of Birth: 27-Oct-1940  [facility]) CM/SW Contact:    Boneta Lucks, RN Phone Number: 11/28/2020, 9:54 AM  Clinical Narrative:     Patient admitted with Transient ischemic attack. Lives at home with his wife. Patient has all equipment necessary in the home. PT is recommending Home Health.  Wife is agreeable, she also has concerns with caring for him.  MD ordering HHRN/PT/SW to assess and assist with long term planning.      Expected Discharge Plan: [place] Barriers to Discharge: Continued Medical Work up  Patient Goals and CMS Choice Patient states their goals for this hospitalization and ongoing recovery are:: to go home. CMS Medicare.gov Compare Post Acute Care list provided to:: Patient Represenative (must comment) Choice offered to / list presented to : Spouse  Expected Discharge Plan and Services Expected Discharge Plan: [place]      Living arrangements for the past 2 months: Single Family Home                     HH Arranged: RN, PT, Social Work [facility] Agency: [place] Date [facility] Agency Contacted: 11/28/20 Time [place]: [phone] Representative spoke with at [place]: Marjory Lies  Prior Living Arrangements/Services Living arrangements for the past 2 months: [place] with:: Spouse Patient language and need for interpreter reviewed:: Yes        Need for Family Participation in Patient Care: Yes (Comment) Care giver support system in place?: Yes (comment) Current home services: DME Criminal Activity/Legal Involvement Pertinent to Current Situation/Hospitalization: No - Comment as needed  Activities of Daily Living Home Assistive Devices/Equipment: Eyeglasses, Nebulizer, Shower chair with back, Environmental consultant (specify type), Wheelchair, Radio producer (specify quad or straight) (straight cane, front  wheel walker, shower chair) ADL Screening (condition at time of admission) Patient's cognitive ability adequate to safely complete daily activities?: Yes (wife helps, he does have some demetia/forgetfullness) Is the patient deaf or have difficulty hearing?: Yes Does the patient have difficulty seeing, even when wearing glasses/contacts?: Yes Does the patient have difficulty concentrating, remembering, or making decisions?: Yes (difficulty remembering sometimes) Patient able to express need for assistance with ADLs?: Yes Does the patient have difficulty dressing or bathing?: No Independently performs ADLs?: Yes (appropriate for developmental age) Does the patient have difficulty walking or climbing stairs?: Yes (difficulty with dizzy spells) Weakness of Legs: None Weakness of Arms/Hands: None  Permission Sought/Granted    Emotional Assessment     Alcohol / Substance Use: Not Applicable Psych Involvement: No (comment)  Admission diagnosis:  Transient ischemic attack [G45.9] Episodic weakness [R53.1] Patient Active Problem List   Diagnosis Date Noted   Transient ischemic attack 11/27/2020   Thrombocytopenia ([place]) 11/27/2020   Orthostatic hypotension 11/27/2020   CKD (chronic kidney disease) stage 3, GFR 30-59 ml/min ([place]) 11/27/2020   Hallucination 09/27/2019   Right inguinal hernia s/p laparoscopic repair 05/28/2017 05/28/2017   Left femoral hernia s/p laparoscopic repair 05/28/2017 05/28/2017   Cerumen impaction 06/05/2015   Stage 3 chronic kidney disease ([place]) 05/22/2015   Arthritis 06/04/2014   CHF (congestive heart failure), NYHA class III ([place]) 02/20/2014   BPH (benign prostatic hyperplasia) 02/20/2014   T wave inversion in EKG 02/20/2014   Coronary artery disease 11/25/2013   Essential hypertension    Unstable angina ([place]) 11/23/2013   Dyspnea on exertion  11/08/2013   Barrett's esophagus 10/27/2011   GERD (gastroesophageal reflux disease) 07/14/2011   CAD (coronary  artery disease), native coronary artery - LIMA-LAD, SVG-Diag, SVG-RCA 2002    Hyperlipidemia LDL goal <70    Hypertensive heart disease    PCP:  Dettinger, Fransisca Kaufmann, MD Pharmacy:   [facility] [address], [place] [address] [place] [address] Phone: [phone] Fax: [phone]     Social Determinants of Health (SDOH) Interventions    Readmission Risk Interventions No flowsheet data found.

## 2020-11-28 NOTE — Progress Notes (Signed)
Pt became agitated when redirected back to bed due to fact he has a condom cath and wanted to go to bathroom. Pt given PRN haldol with minimal relief. Pt urinated on floor after ambulating and almost falling. Pt cleaned and new condom cath put on. Pt became agitated again and was given PRN ativan  with some effectiveness. Pt still fidgeting in bed and not compliant with staff.  Louanne Skye 10:32 PM 11/28/20

## 2020-11-29 DIAGNOSIS — I10 Essential (primary) hypertension: Secondary | ICD-10-CM | POA: Diagnosis not present

## 2020-11-29 DIAGNOSIS — I252 Old myocardial infarction: Secondary | ICD-10-CM | POA: Diagnosis not present

## 2020-11-29 DIAGNOSIS — E785 Hyperlipidemia, unspecified: Secondary | ICD-10-CM | POA: Diagnosis present

## 2020-11-29 DIAGNOSIS — N1832 Chronic kidney disease, stage 3b: Secondary | ICD-10-CM | POA: Diagnosis present

## 2020-11-29 DIAGNOSIS — R410 Disorientation, unspecified: Secondary | ICD-10-CM | POA: Diagnosis present

## 2020-11-29 DIAGNOSIS — F0281 Dementia in other diseases classified elsewhere with behavioral disturbance: Secondary | ICD-10-CM | POA: Diagnosis present

## 2020-11-29 DIAGNOSIS — J8410 Pulmonary fibrosis, unspecified: Secondary | ICD-10-CM | POA: Diagnosis present

## 2020-11-29 DIAGNOSIS — Z85828 Personal history of other malignant neoplasm of skin: Secondary | ICD-10-CM | POA: Diagnosis not present

## 2020-11-29 DIAGNOSIS — D631 Anemia in chronic kidney disease: Secondary | ICD-10-CM | POA: Diagnosis present

## 2020-11-29 DIAGNOSIS — I5032 Chronic diastolic (congestive) heart failure: Secondary | ICD-10-CM | POA: Diagnosis present

## 2020-11-29 DIAGNOSIS — I130 Hypertensive heart and chronic kidney disease with heart failure and stage 1 through stage 4 chronic kidney disease, or unspecified chronic kidney disease: Secondary | ICD-10-CM | POA: Diagnosis present

## 2020-11-29 DIAGNOSIS — G309 Alzheimer's disease, unspecified: Secondary | ICD-10-CM | POA: Diagnosis present

## 2020-11-29 DIAGNOSIS — I080 Rheumatic disorders of both mitral and aortic valves: Secondary | ICD-10-CM | POA: Diagnosis present

## 2020-11-29 DIAGNOSIS — Z20822 Contact with and (suspected) exposure to covid-19: Secondary | ICD-10-CM | POA: Diagnosis present

## 2020-11-29 DIAGNOSIS — Z66 Do not resuscitate: Secondary | ICD-10-CM | POA: Diagnosis present

## 2020-11-29 DIAGNOSIS — D696 Thrombocytopenia, unspecified: Secondary | ICD-10-CM | POA: Diagnosis present

## 2020-11-29 DIAGNOSIS — I2510 Atherosclerotic heart disease of native coronary artery without angina pectoris: Secondary | ICD-10-CM | POA: Diagnosis present

## 2020-11-29 DIAGNOSIS — R471 Dysarthria and anarthria: Secondary | ICD-10-CM | POA: Diagnosis present

## 2020-11-29 DIAGNOSIS — Z951 Presence of aortocoronary bypass graft: Secondary | ICD-10-CM | POA: Diagnosis not present

## 2020-11-29 DIAGNOSIS — F05 Delirium due to known physiological condition: Secondary | ICD-10-CM | POA: Diagnosis present

## 2020-11-29 DIAGNOSIS — R262 Difficulty in walking, not elsewhere classified: Secondary | ICD-10-CM | POA: Diagnosis present

## 2020-11-29 DIAGNOSIS — G459 Transient cerebral ischemic attack, unspecified: Secondary | ICD-10-CM | POA: Diagnosis present

## 2020-11-29 DIAGNOSIS — K219 Gastro-esophageal reflux disease without esophagitis: Secondary | ICD-10-CM | POA: Diagnosis present

## 2020-11-29 DIAGNOSIS — Z8616 Personal history of COVID-19: Secondary | ICD-10-CM | POA: Diagnosis not present

## 2020-11-29 DIAGNOSIS — N400 Enlarged prostate without lower urinary tract symptoms: Secondary | ICD-10-CM | POA: Diagnosis present

## 2020-11-29 DIAGNOSIS — I951 Orthostatic hypotension: Secondary | ICD-10-CM | POA: Diagnosis present

## 2020-11-29 MED ORDER — QUETIAPINE FUMARATE 25 MG PO TABS
50.0000 mg | ORAL_TABLET | Freq: Every day | ORAL | Status: DC
  Administered 2020-11-29: 50 mg via ORAL
  Filled 2020-11-29: qty 2

## 2020-11-29 MED ORDER — LACTATED RINGERS IV SOLN: INTRAVENOUS | Status: DC

## 2020-11-29 MED ORDER — QUETIAPINE FUMARATE 100 MG PO TABS: 100.0000 mg | ORAL_TABLET | Freq: Two times a day (BID) | ORAL | Status: DC

## 2020-11-29 MED ORDER — QUETIAPINE FUMARATE 100 MG PO TABS
100.0000 mg | ORAL_TABLET | Freq: Every day | ORAL | Status: DC
  Administered 2020-11-29: 100 mg via ORAL
  Filled 2020-11-29 (×2): qty 1

## 2020-11-29 NOTE — Progress Notes (Signed)
NT went to take BP, pt became agitated. PRN haldol given. Tech able to get vitals. No c/o pain or anything after medcine getting vs. Louanne Skye 11/29/20 5:55 AM

## 2020-11-29 NOTE — TOC Progression Note (Addendum)
Transition of Care [facility]) - Progression Note    Patient Details  Name: DELANTE LLANOS MRN: [id number] Date of Birth: 11/12/1940  Transition of Care [facility]) CM/SW Contact  Boneta Lucks, RN Phone Number: 11/29/2020, 1:56 PM  Clinical Narrative:   Patient declining, family asking for PT to reevaluate. PT is now recommending SNF. Family is agreeable. TOC spoke with wife she requested we speak with his sisterManuela Schwartz, both at the bedside. Patient is vaccinated , they are requesting [facility] or [facility]. PASSR is pending, Uploading documents to [place] [employer]. FL2 sent out for bed offers. TOC holding for [employer] to start INS Auth.  Addendum Annia Friendly approved [id number]  Expected Discharge Plan: Skilled Nursing Facility Barriers to Discharge: Continued Medical Work up  Expected Discharge Plan and Services Expected Discharge Plan: [place] arrangements for the past 2 months: Single Family Home          HH Arranged: RN, PT, Social Work [facility] Agency: [place] Date [place]: 11/28/20 Time [place]: [phone] Representative spoke with at [place]: Marjory Lies  Readmission Risk Interventions No flowsheet data found.

## 2020-11-29 NOTE — Progress Notes (Signed)
Physical Therapy Treatment Patient Details Name: Isaac [name] MRN: [id number] DOB: 19-Aug-1940 Today's Date: 11/29/2020    History of Present Illness Isaac [name] is a 80 y.o. male with medical history significant for hypertension, hyperlipidemia, GERD, CAD s/p CABG on aspirin, BPH, CKD stage 3, hearing impairment, Alzheimer's dementia who presents to the emergency department due to episodes of ambulatory difficulty in which patient was holding onto walls as well as 1 month of repetitive movements of right foot and lightheadedness usually when walking or standing.  He presents with slurred speech which lasted about 20 minutes prior to returning to baseline.Patient also complained of dizziness and blurry vision when he stands up from sitting position. He ambulates with a cane at baseline.    PT Comments    Patient presents confused and requires Max verbal/tactile cueing to participate with therapy.  Patient demonstrates slow labored movement for sitting up at bedside, unable to stand without use of RW due to BLE weakness, once standing with RW, limited to a few steps at bedside before falling backwards onto bed without injury, became more lethargic and put back to bed with 2 person assist with family members present in room - RN aware.   Follow Up Recommendations  SNF     Equipment Recommendations  None recommended by PT    Recommendations for Other Services       Precautions / Restrictions Precautions Precautions: Fall Restrictions Weight Bearing Restrictions: No    Mobility  Bed Mobility Overal bed mobility: Needs Assistance Bed Mobility: Supine to Sit;Sit to Supine     Supine to sit: Min assist Sit to supine: Mod assist   General bed mobility comments: increased time, labored movement    Transfers Overall transfer level: Needs assistance Equipment used: Rolling walker (2 wheeled) Transfers: Sit to/from Stand Sit to Stand: Mod assist         General transfer  comment: very unsteady on feet requiring constant verbal/tactile cueing for safety  Ambulation/Gait Ambulation/Gait assistance: Mod assist;Max assist Gait Distance (Feet): 4 Feet Assistive device: Rolling walker (2 wheeled) Gait Pattern/deviations: Decreased step length - right;Decreased step length - left;Decreased stride length Gait velocity: decreased   General Gait Details: limited to 4-5 steps at bedside before losing balance and falling onto bed due to generalized weakness and confusion   Stairs             Wheelchair Mobility    Modified Rankin (Stroke Patients Only)       Balance Overall balance assessment: Needs assistance Sitting-balance support: Feet supported;No upper extremity supported Sitting balance-Leahy Scale: Fair Sitting balance - Comments: fair/good seated at EOB   Standing balance support: During functional activity;No upper extremity supported Standing balance-Leahy Scale: Poor Standing balance comment: fair/poor using RW                            Cognition Arousal/Alertness: Awake/alert;Lethargic Behavior During Therapy: Impulsive;Flat affect Overall Cognitive Status: History of cognitive impairments - at baseline                                        Exercises      General Comments        Pertinent Vitals/Pain Pain Assessment: No/denies pain    Home Living  Prior Function            PT Goals (current goals can now be found in the care plan section) Acute Rehab PT Goals Patient Stated Goal: retrun home with family to assist PT Goal Formulation: With patient Time For Goal Achievement: 12/04/20 Potential to Achieve Goals: Fair Progress towards PT goals: Not progressing toward goals - comment;Progressing toward goals    Frequency    Min 3X/week      PT Plan      Co-evaluation              AM-PAC PT "6 Clicks" Mobility   Outcome Measure  Help needed  turning from your back to your side while in a flat bed without using bedrails?: A Little Help needed moving from lying on your back to sitting on the side of a flat bed without using bedrails?: A Lot Help needed moving to and from a bed to a chair (including a wheelchair)?: A Lot Help needed standing up from a chair using your arms (e.g., wheelchair or bedside chair)?: A Lot Help needed to walk in hospital room?: A Lot Help needed climbing 3-5 steps with a railing? : Total 6 Click Score: 12    End of Session   Activity Tolerance: Patient limited by fatigue;Patient limited by lethargy Patient left: in bed;with call bell/phone within reach;with bed alarm set;with family/visitor present Nurse Communication: Mobility status PT Visit Diagnosis: Unsteadiness on feet (R26.81);Other abnormalities of gait and mobility (R26.89);Difficulty in walking, not elsewhere classified (R26.2)     Time: [id number] PT Time Calculation (min) (ACUTE ONLY): 26 min  Charges:  $Therapeutic Activity: 23-37 mins                     1:53 PM, 11/29/20 Isaac [name], MPT Physical Therapist with [facility] [phone] office [phone] mobile phone

## 2020-11-29 NOTE — Progress Notes (Signed)
PROGRESS NOTE  Isaac [name] [id number] DOB: 1941-01-04 DOA: 11/26/2020 PCP: Dettinger, Fransisca Kaufmann, MD  Brief History:  80 year old male with a history of dementia, CAD, CKD stage III, diastolic CHF, hypertension, hyperlipidemia , and pulmonary fibrosis presenting with 38-monthhistory of transient episodes of dizziness, gait instability, and slurred speech.  The patient is a poor historian secondary to his dementia.  History is obtained from review of the medical record and speaking with the patient spouse at the bedside.  Patient's spouse states that these episodes last about 15 to 20 minutes and have been happening almost on a daily basis for the past month.  She states that it usually occurs after he initially gets up to go to the kitchen.  After about 30 to 60 seconds from the time the patient stands, he begins to have episodes of dizziness and gait instability to the point he has had to hold onto the wall.  She describes this partly as as his legs moving funny.  She also states that he has what she describes as slurred speech during these episodes.  During these episodes, the patient denies any headache, visual disturbance, chest pain, shortness of breath.  There is no syncope.  He usually returns back to his usual baseline without any focal deficits after about 20 minutes.  She states that he has been started on some new medication about 1 month ago, but she does not recall the medicine. Over the past 2 months, the spouse states that the patient has had continued functional and cognitive decline.  He has not had any fevers, chills, chest pain, nausea, vomiting, diarrhea, abdominal pain, dysuria, hematuria.  Assessment/Plan: Gait instability/slurred speech -Suspect that orthostatic hypotension/transient hypotension is causing these episodes -MRI brain without any acute findings of stroke -EEG without any signs of seizures -Serum B12, low, replace -Folic acid -TSH 10000000-PT evaluation  currently recommending home health with supervision, will assess to reassess since it is only patient and his wife currently at home -UA negative for pyuria -Seen by neurology who thought symptoms may be related to progression of dementia/sundowning -Currently, appears to be somnolent/agitated and not at baseline  Orthostatic hypotension -Start IV fluids -Recheck orthostatics  Essential hypertension -Blood pressures have been trending up -Restart metoprolol and amlodipine  CKD stage IIIb -Baseline creatinine 1.5-1.7 -Monitor  Dementia with behavioral disturbance -Continue Namenda -Started on Seroquel to help treat sundowning and agitation  Thrombocytopenia -This appears to be chronic and intermittent dating back to 2018 -Work-up as above -Coags are normal  Hyperlipidemia -Continue statin  Pulmonary fibrosis -Patient follows with pulmonary, Dr. MVaughan Browner-pt will have to bring in Pirfenidone  Disposition -Patient's wife reports that it is only her and the patient who lives in their home -Reassessed by PT with recommendations for skilled nursing facility placement     Status is: Inpatient    Dispo: The patient is from: Home              Anticipated d/c is to: SNF              Patient currently is not medically stable to d/c.   Difficult to place patient No        Family Communication:   spouse updated at bedside 8/4  Consultants:  neurology  Code Status:  DNR  DVT Prophylaxis:  [place] Lovenox   Procedures: As Listed in Progress Note Above  Antibiotics: None   Total time spent  35 minutes.  Greater than 50% spent face to face counseling and coordinating care.     Subjective: Continues to have periods of agitation overnight requiring sedatives.  He is somnolent at this time.  Objective: Vitals:   11/28/20 2022 11/29/20 0456 11/29/20 1320 11/29/20 2052  BP: (!) 147/86 (!) 173/81 129/79 117/69  Pulse: 82 74 73 80  Resp: '19 19 18 18  '$ Temp: 97.8 F  (36.6 C) 98.3 F (36.8 C) 98.2 F (36.8 C) 98.4 F (36.9 C)  TempSrc: Oral  Oral Oral  SpO2: 100% 100% 99% 98%  Weight:      Height:        Intake/Output Summary (Last 24 hours) at 11/29/2020 2106 Last data filed at 11/29/2020 1800 Gross per 24 hour  Intake 1000 ml  Output 700 ml  Net 300 ml   Weight change:  Exam:  General exam: Somnolent Respiratory system: Clear to auscultation. Respiratory effort normal. Cardiovascular system:RRR. No murmurs, rubs, gallops. Gastrointestinal system: Abdomen is nondistended, soft and nontender. No organomegaly or masses felt. Normal bowel sounds heard. Central nervous system:  No focal neurological deficits. Extremities: No C/C/E, +pedal pulses Skin: No rashes, lesions or ulcers Psychiatry: Remains agitated and confused.  Required sedative medications    Data Reviewed: I have personally reviewed following labs and imaging studies Basic Metabolic Panel: Recent Labs  Lab 11/26/20 1743 11/27/20 0346  NA 137 139  K 4.1 3.7  CL 106 108  CO2 25 25  GLUCOSE 97 139*  BUN 26* 24*  CREATININE 1.69* 1.50*  CALCIUM 8.7* 9.0  MG  --  1.9  PHOS  --  2.6   Liver Function Tests: Recent Labs  Lab 11/26/20 1743 11/27/20 0346  AST 15 16  ALT 10 10  ALKPHOS 67 67  BILITOT 0.5 0.6  PROT 7.3 7.2  ALBUMIN 3.9 3.7   No results for input(s): LIPASE, AMYLASE in the last 168 hours. No results for input(s): AMMONIA in the last 168 hours. Coagulation Profile: Recent Labs  Lab 11/27/20 0346  INR 1.1   CBC: Recent Labs  Lab 11/26/20 1743 11/27/20 0346  WBC 7.7 8.6  NEUTROABS 4.9  --   HGB 12.9* 12.9*  HCT 37.9* 38.7*  MCV 97.9 98.2  PLT 147* 144*   Cardiac Enzymes: Recent Labs  Lab 11/27/20 0346  CKTOTAL 38*   BNP: Invalid input(s): POCBNP CBG: No results for input(s): GLUCAP in the last 168 hours. HbA1C: Recent Labs    11/27/20 0346  HGBA1C 5.4   Urine analysis:    Component Value Date/Time   COLORURINE YELLOW  11/26/2020 2318   APPEARANCEUR CLEAR 11/26/2020 2318   APPEARANCEUR Clear 04/11/2020 1035   LABSPEC 1.009 11/26/2020 2318   PHURINE 6.0 11/26/2020 2318   GLUCOSEU NEGATIVE 11/26/2020 2318   GLUCOSEU NEGATIVE 09/06/2012 0925   HGBUR NEGATIVE 11/26/2020 2318   BILIRUBINUR NEGATIVE 11/26/2020 2318   BILIRUBINUR Negative 04/11/2020 1035   KETONESUR NEGATIVE 11/26/2020 2318   PROTEINUR NEGATIVE 11/26/2020 2318   UROBILINOGEN 0.2 09/06/2012 0925   NITRITE NEGATIVE 11/26/2020 2318   LEUKOCYTESUR NEGATIVE 11/26/2020 2318   Sepsis Labs: '@LABRCNTIP'$ (procalcitonin:4,lacticidven:4) ) Recent Results (from the past 240 hour(s))  Resp Panel by RT-PCR (Flu A&B, Covid) Nasopharyngeal Swab     Status: None   Collection Time: 11/26/20  5:43 PM   Specimen: Nasopharyngeal Swab; Nasopharyngeal(NP) swabs in vial transport medium  Result Value Ref Range Status   SARS Coronavirus 2 by RT PCR NEGATIVE NEGATIVE Final  Comment: (NOTE) SARS-CoV-2 target nucleic acids are NOT DETECTED.  The SARS-CoV-2 RNA is generally detectable in upper respiratory specimens during the acute phase of infection. The lowest concentration of SARS-CoV-2 viral copies this assay can detect is 138 copies/mL. A negative result does not preclude SARS-Cov-2 infection and should not be used as the sole basis for treatment or other patient management decisions. A negative result may occur with  improper specimen collection/handling, submission of specimen other than nasopharyngeal swab, presence of viral mutation(s) within the areas targeted by this assay, and inadequate number of viral copies(<138 copies/mL). A negative result must be combined with clinical observations, patient history, and epidemiological information. The expected result is Negative.  Fact Sheet for Patients:  EntrepreneurPulse.com.au  Fact Sheet for Healthcare Providers:  IncredibleEmployment.be  This test is no t yet  approved or cleared by the [place] FDA and  has been authorized for detection and/or diagnosis of SARS-CoV-2 by FDA under an Emergency Use Authorization (EUA). This EUA will remain  in effect (meaning this test can be used) for the duration of the COVID-19 declaration under Section 564(b)(1) of the Act, 21 U.S.C.section 360bbb-3(b)(1), unless the authorization is terminated  or revoked sooner.       Influenza A by PCR NEGATIVE NEGATIVE Final   Influenza B by PCR NEGATIVE NEGATIVE Final    Comment: (NOTE) The Xpert Xpress SARS-CoV-2/FLU/RSV plus assay is intended as an aid in the diagnosis of influenza from Nasopharyngeal swab specimens and should not be used as a sole basis for treatment. Nasal washings and aspirates are unacceptable for Xpert Xpress SARS-CoV-2/FLU/RSV testing.  Fact Sheet for Patients: EntrepreneurPulse.com.au  Fact Sheet for Healthcare Providers: IncredibleEmployment.be  This test is not yet approved or cleared by the [place] FDA and has been authorized for detection and/or diagnosis of SARS-CoV-2 by FDA under an Emergency Use Authorization (EUA). This EUA will remain in effect (meaning this test can be used) for the duration of the COVID-19 declaration under Section 564(b)(1) of the Act, 21 U.S.C. section 360bbb-3(b)(1), unless the authorization is terminated or revoked.  Performed at [facility], [address], [place], [place] [address]      Scheduled Meds:  amLODipine  2.5 mg Oral Daily   aspirin EC  81 mg Oral Daily   memantine  10 mg Oral BID   metoprolol succinate  50 mg Oral Daily   mometasone-formoterol  2 puff Inhalation BID   pantoprazole  40 mg Oral Daily   QUEtiapine  100 mg Oral QHS   QUEtiapine  50 mg Oral Q1400   simvastatin  20 mg Oral QHS   Continuous Infusions:  lactated ringers 75 mL/hr at 11/29/20 1737     Procedures/Studies: DG Chest 2 View  Result Date: 11/15/2020 CLINICAL  DATA:  Shortness of breath and cough slight cough for 1 year, history 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 Mediastinal contours and pulmonary vascularity normal. Emphysematous and bronchitic changes consistent with COPD. Bibasilar atelectasis with nodular density at LEFT lung base 10 mm diameter, not seen on previous exam but noted on 09/11/2016 study, likely nipple shadow. No acute infiltrate, pleural effusion, or pneumothorax. Bones demineralized with chronic compression deformity at the thoracolumbar junction. IMPRESSION: COPD changes with bibasilar atelectasis versus scarring. Emphysema (ICD10-J43.9). Electronically Signed   By: Lavonia Dana M.D.   On: 11/15/2020 10:48   CT Head Wo Contrast  Result Date: 11/26/2020  CLINICAL DATA:  Mental status change, unknown cause Patient reports dizziness with multiple falls. EXAM: CT HEAD WITHOUT CONTRAST TECHNIQUE: Contiguous axial images were obtained from the base of the skull through the vertex without intravenous contrast. COMPARISON:  Head CT 02/18/2020 FINDINGS: Brain: No evidence of acute infarction, hemorrhage, hydrocephalus, extra-axial collection or mass lesion/mass effect. Stable brain volume and mild periventricular chronic small vessel ischemia. 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 Vascular: Atherosclerosis of skullbase vasculature without hyperdense vessel or abnormal calcification. Skull: No fracture or focal lesion. Sinuses/Orbits: Paranasal sinuses and mastoid air cells are clear. The visualized orbits are unremarkable. Bilateral cataract resection. Other: None. IMPRESSION: 1. No acute intracranial abnormality. 2. Stable chronic small vessel ischemia. 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 Electronically Signed   By: Keith Rake M.D.   On: 11/26/2020 18:25   MR ANGIO HEAD WO CONTRAST  Result Date:  11/27/2020 CLINICAL DATA:  TIA; episodes of weakness in both legs EXAM: MRI HEAD WITHOUT CONTRAST MRA HEAD WITHOUT CONTRAST MRA NECK WITHOUT AND WITH CONTRAST TECHNIQUE: Multiplanar, multi-echo pulse sequences of the brain and surrounding structures were acquired without intravenous contrast. Angiographic images of the Circle of Willis were acquired using MRA technique without intravenous contrast. Angiographic images of the neck were acquired using MRA technique without and with intravenous contrast. Carotid stenosis measurements (when applicable) are obtained utilizing NASCET criteria, using the distal internal carotid diameter as the denominator. CONTRAST:  72m GADAVIST GADOBUTROL 1 MMOL/ML IV SOLN COMPARISON:  10/19/2019 FINDINGS: MRI HEAD Motion artifact is present. Brain: There is no acute infarction or intracranial hemorrhage. There is no intracranial mass, mass effect, or edema. There is no hydrocephalus or extra-axial fluid collection. Patchy T2 hyperintensity in the supratentorial white matter is nonspecific but probably reflects mild to moderate chronic microvascular ischemic changes. Prominence of the ventricles and sulci reflects minor generalized parenchymal volume loss. 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 Multiple small chronic cerebellar infarcts. Vascular: Major vessel flow voids at the skull base are preserved. Skull and upper cervical spine: Normal marrow signal is preserved. Sinuses/Orbits: Minor mucosal thickening. Bilateral lens replacements. Other: Sella is unremarkable.  Mastoid air cells are clear. MRA HEAD Intracranial internal carotid arteries are patent. Middle and anterior cerebral arteries are patent. Intracranial vertebral arteries, basilar artery, posterior cerebral arteries are patent. There is no significant stenosis or aneurysm. MRA NECK Great vessel origins are patent. Common, internal, and external  carotid arteries are patent. Extracranial codominant vertebral arteries are patent. There is no hemodynamically significant stenosis. Atherosclerotic irregularity at the left greater than right ICA origins. IMPRESSION: No acute infarction, hemorrhage, or mass. 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 No large vessel occlusion, hemodynamically significant stenosis, or evidence of dissection. Electronically Signed   By: PMacy MisM.D.   On: 11/27/2020 09:17   MR Angiogram Neck W or Wo Contrast  Result Date: 11/27/2020 CLINICAL DATA:  TIA; episodes of weakness in both legs EXAM: MRI HEAD WITHOUT CONTRAST MRA HEAD WITHOUT CONTRAST MRA NECK WITHOUT AND WITH CONTRAST TECHNIQUE: Multiplanar, multi-echo pulse sequences of the brain and surrounding structures were acquired without intravenous contrast. Angiographic images of the Circle of Willis were acquired using MRA technique without intravenous contrast. Angiographic images of the neck were acquired using MRA technique without and with intravenous contrast. Carotid stenosis measurements (when applicable) are obtained utilizing NASCET criteria, using the distal internal carotid diameter as the denominator. CONTRAST:  740m  GADAVIST GADOBUTROL 1 MMOL/ML IV SOLN COMPARISON:  10/19/2019 FINDINGS: MRI HEAD Motion artifact is present. Brain: There is no acute infarction or intracranial hemorrhage. There is no intracranial mass, mass effect, or edema. There is no hydrocephalus or extra-axial fluid collection. Patchy T2 hyperintensity in the supratentorial white matter is nonspecific but probably reflects mild to moderate chronic microvascular ischemic changes. Prominence of the ventricles and sulci reflects minor generalized parenchymal volume loss. 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 Multiple small chronic cerebellar infarcts. Vascular: Major vessel flow voids at the skull  base are preserved. Skull and upper cervical spine: Normal marrow signal is preserved. Sinuses/Orbits: Minor mucosal thickening. Bilateral lens replacements. Other: Sella is unremarkable.  Mastoid air cells are clear. MRA HEAD Intracranial internal carotid arteries are patent. Middle and anterior cerebral arteries are patent. Intracranial vertebral arteries, basilar artery, posterior cerebral arteries are patent. There is no significant stenosis or aneurysm. MRA NECK Great vessel origins are patent. Common, internal, and external carotid arteries are patent. Extracranial codominant vertebral arteries are patent. There is no hemodynamically significant stenosis. Atherosclerotic irregularity at the left greater than right ICA origins. IMPRESSION: No acute infarction, hemorrhage, or mass. 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 No large vessel occlusion, hemodynamically significant stenosis, or evidence of dissection. Electronically Signed   By: Macy Mis M.D.   On: 11/27/2020 09:17   MR BRAIN WO CONTRAST  Result Date: 11/27/2020 CLINICAL DATA:  TIA; episodes of weakness in both legs EXAM: MRI HEAD WITHOUT CONTRAST MRA HEAD WITHOUT CONTRAST MRA NECK WITHOUT AND WITH CONTRAST TECHNIQUE: Multiplanar, multi-echo pulse sequences of the brain and surrounding structures were acquired without intravenous contrast. Angiographic images of the Circle of Willis were acquired using MRA technique without intravenous contrast. Angiographic images of the neck were acquired using MRA technique without and with intravenous contrast. Carotid stenosis measurements (when applicable) are obtained utilizing NASCET criteria, using the distal internal carotid diameter as the denominator. CONTRAST:  83m GADAVIST GADOBUTROL 1 MMOL/ML IV SOLN COMPARISON:  10/19/2019 FINDINGS: MRI HEAD Motion artifact is present. Brain: There is no acute infarction or intracranial hemorrhage. There is no intracranial mass, mass  effect, or edema. There is no hydrocephalus or extra-axial fluid collection. Patchy T2 hyperintensity in the supratentorial white matter is nonspecific but probably reflects mild to moderate chronic microvascular ischemic changes. Prominence of the ventricles and sulci reflects minor generalized parenchymal volume loss. 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 Multiple small chronic cerebellar infarcts. Vascular: Major vessel flow voids at the skull base are preserved. Skull and upper cervical spine: Normal marrow signal is preserved. Sinuses/Orbits: Minor mucosal thickening. Bilateral lens replacements. Other: Sella is unremarkable.  Mastoid air cells are clear. MRA HEAD Intracranial internal carotid arteries are patent. Middle and anterior cerebral arteries are patent. Intracranial vertebral arteries, basilar artery, posterior cerebral arteries are patent. There is no significant stenosis or aneurysm. MRA NECK Great vessel origins are patent. Common, internal, and external carotid arteries are patent. Extracranial codominant vertebral arteries are patent. There is no hemodynamically significant stenosis. Atherosclerotic irregularity at the left greater than right ICA origins. IMPRESSION: No acute infarction, hemorrhage, or mass. 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 No large vessel occlusion, hemodynamically significant stenosis, or evidence of dissection. Electronically Signed   By: PMacy MisM.D.   On: 11/27/2020 09:17   DG Chest Portable  1 View  Result Date: 11/26/2020 CLINICAL DATA:  Chest pain, near syncope EXAM: PORTABLE CHEST 1 VIEW COMPARISON:  11/14/2020 FINDINGS: Rotated AP portable examination with scarring and/or fibrosis at the bilateral lung bases. No acute appearing airspace opacity. Cardiomegaly status post median sternotomy and CABG. IMPRESSION: Rotated AP portable examination with scarring  and/or fibrosis at the bilateral lung bases. No acute appearing airspace opacity. Electronically Signed   By: Eddie Candle M.D.   On: 11/26/2020 18:00   EEG adult  Result Date: 11/27/2020 Lora Havens, MD     11/27/2020 12:21 PM Patient Name: Isaac [name] MRN: [id number] Epilepsy Attending: Lora Havens Referring Physician/Provider: Dr Bernadette Hoit Date: 11/27/2020 Duration: 24.10 mins Patient history: 80yo M with dizziness. EEG to evaluate for seizure Level of alertness: Awake AEDs during EEG study: None Technical aspects: This EEG study was done with scalp electrodes positioned according to the 10-20 International system of electrode placement. Electrical activity was acquired at a sampling rate of '500Hz'$  and reviewed with a high frequency filter of '70Hz'$  and a low frequency filter of '1Hz'$ . EEG data were recorded continuously and digitally stored. Description: The posterior dominant rhythm consists of '6Hz'$  activity of moderate voltage (25-35 uV) seen predominantly in posterior head regions, symmetric and reactive to eye opening and eye closing. Hyperventilation and photic stimulation were not performed.   ABNORMALITY - Background slow IMPRESSION: This study is suggestive of mild diffuse encephalopathy, nonspecific etiology. No seizures or epileptiform discharges were seen throughout the recording. Lora Havens   ECHOCARDIOGRAM COMPLETE  Result Date: 11/27/2020    ECHOCARDIOGRAM REPORT   Patient Name:   Isaac [name] Date of Exam: 11/27/2020 Medical Rec #:  [id number]     Height:       70.5 in Accession #:    [id number]    Weight:       148.0 lb Date of Birth:  Jun 12, 1940     BSA:          1.846 m Patient Age:    66 years      BP:           146/86 mmHg Patient Gender: M             HR:           67 bpm. Exam Location:  [facility] Procedure: 2D Echo, Cardiac Doppler and Color Doppler Indications:    Stroke  History:        Patient has prior history of Echocardiogram examinations, most                  recent 09/08/2019. CHF, CAD, Prior CABG, Stroke,                 Signs/Symptoms:Dyspnea and Chest Pain; Risk Factors:Hypertension                 and Dyslipidemia.  Sonographer:    Wenda Low Referring Phys: [id number] OLADAPO ADEFESO IMPRESSIONS  1. Left ventricular ejection fraction, by estimation, is 55 to 60%. The left ventricle has normal function. The left ventricle demonstrates regional wall motion abnormalities (see scoring diagram/findings for description). There is mild left ventricular  hypertrophy. Left ventricular diastolic parameters are consistent with Grade I diastolic dysfunction (impaired relaxation).  2. Right ventricular systolic function is normal. The right ventricular size is normal. There is normal pulmonary artery systolic pressure. The estimated right ventricular systolic pressure is 123456 mmHg.  3. Left atrial size was moderately dilated.  4. The mitral valve is degenerative. Moderate mitral valve regurgitation.  5. The aortic valve is tricuspid. Aortic valve regurgitation is mild to moderate. Aortic regurgitation PHT measures 552 msec. Aortic valve mean gradient measures 2.0 mmHg.  6. The inferior vena cava is normal in size with greater than 50% respiratory variability, suggesting right atrial pressure of 3 mmHg. Comparison(s): Prior images reviewed side by side. Inferoposterior wall motion abnormalitiy present on prior study. Mitral regurgitation is moderate at this point. FINDINGS  Left Ventricle: Left ventricular ejection fraction, by estimation, is 55 to 60%. The left ventricle has normal function. The left ventricle demonstrates regional wall motion abnormalities. The left ventricular internal cavity size was normal in size. There is mild left ventricular hypertrophy. Left ventricular diastolic parameters are consistent with Grade I diastolic dysfunction (impaired relaxation).  LV Wall Scoring: The basal inferolateral segment, basal anterolateral segment, and basal inferior segment  are akinetic. The entire anterior wall, mid and distal lateral wall, entire septum, entire apex, mid and distal inferior wall, and mid anterolateral segment are normal. Right Ventricle: The right ventricular size is normal. No increase in right ventricular wall thickness. Right ventricular systolic function is normal. There is normal pulmonary artery systolic pressure. The tricuspid regurgitant velocity is 2.32 m/s, and  with an assumed right atrial pressure of 3 mmHg, the estimated right ventricular systolic pressure is 123456 mmHg. Left Atrium: Left atrial size was moderately dilated. Right Atrium: Right atrial size was normal in size. Pericardium: There is no evidence of pericardial effusion. Mitral Valve: The mitral valve is degenerative in appearance. There is mild thickening of the mitral valve leaflet(s). Mild mitral annular calcification. Moderate mitral valve regurgitation, with eccentric posteriorly directed jet. MV peak gradient, 3.0 mmHg. The mean mitral valve gradient is 1.0 mmHg. Tricuspid Valve: The tricuspid valve is grossly normal. Tricuspid valve regurgitation is trivial. Aortic Valve: The aortic valve is tricuspid. There is mild aortic valve annular calcification. Aortic valve regurgitation is mild to moderate. Aortic regurgitation PHT measures 552 msec. Aortic valve mean gradient measures 2.0 mmHg. Aortic valve peak gradient measures 4.4 mmHg. Aortic valve area, by VTI measures 2.92 cm. Pulmonic Valve: The pulmonic valve was grossly normal. Pulmonic valve regurgitation is trivial. Aorta: The aortic root is normal in size and structure. Venous: The inferior vena cava is normal in size with greater than 50% respiratory variability, suggesting right atrial pressure of 3 mmHg. IAS/Shunts: No atrial level shunt detected by color flow Doppler.  LEFT VENTRICLE PLAX 2D LVIDd:         5.18 cm      Diastology LVIDs:         2.86 cm      LV e' medial:    4.64 cm/s LV PW:         1.35 cm      LV E/e' medial:   9.5 LV IVS:        1.09 cm      LV e' lateral:   5.78 cm/s LVOT diam:     2.00 cm      LV E/e' lateral: 7.6 LV SV:         76 LV SV Index:   41 LVOT Area:     3.14 cm  LV Volumes (MOD) LV vol d, MOD A2C: 59.4 ml LV vol d, MOD A4C: 114.0 ml LV vol s, MOD A2C: 41.7 ml LV vol s, MOD A4C: 50.4 ml LV SV MOD A2C:     17.7 ml LV SV  MOD A4C:     114.0 ml LV SV MOD BP:      37.1 ml RIGHT VENTRICLE RV Basal diam:  3.39 cm RV Mid diam:    2.85 cm RV S prime:     12.10 cm/s TAPSE (M-mode): 2.2 cm LEFT ATRIUM             Index       RIGHT ATRIUM           Index LA diam:        4.70 cm 2.55 cm/m  RA Area:     19.40 cm LA Vol (A2C):   69.6 ml 37.70 ml/m RA Volume:   48.80 ml  26.43 ml/m LA Vol (A4C):   80.2 ml 43.44 ml/m LA Biplane Vol: 75.7 ml 41.00 ml/m  AORTIC VALVE AV Area (Vmax):    2.62 cm AV Area (Vmean):   2.53 cm AV Area (VTI):     2.92 cm AV Vmax:           105.00 cm/s AV Vmean:          72.100 cm/s AV VTI:            0.259 m AV Peak Grad:      4.4 mmHg AV Mean Grad:      2.0 mmHg LVOT Vmax:         87.70 cm/s LVOT Vmean:        58.000 cm/s LVOT VTI:          0.241 m LVOT/AV VTI ratio: 0.93 AI PHT:            552 msec  AORTA Ao Root diam: 3.20 cm Ao Asc diam:  3.50 cm MITRAL VALVE               TRICUSPID VALVE MV Area (PHT): 3.15 cm    TR Peak grad:   21.5 mmHg MV Area VTI:   4.32 cm    TR Vmax:        232.00 cm/s MV Peak grad:  3.0 mmHg MV Mean grad:  1.0 mmHg    SHUNTS MV Vmax:       0.86 m/s    Systemic VTI:  0.24 m MV Vmean:      39.6 cm/s   Systemic Diam: 2.00 cm MV Decel Time: 241 msec MV E velocity: 44.20 cm/s MV A velocity: 62.70 cm/s MV E/A ratio:  0.70 Rozann Lesches MD Electronically signed by Rozann Lesches MD Signature Date/Time: 11/27/2020/11:28:33 AM    Final     Kathie Dike, MD  [facility]  If 7PM-7AM, please contact night-coverage [email or web address]  11/29/2020, 9:06 PM   LOS: 0 days

## 2020-11-29 NOTE — NC FL2 (Signed)
[employer] LEVEL OF CARE SCREENING TOOL     IDENTIFICATION  Patient Name: Isaac [name] Birthdate: 02/09/1941 Sex: male Admission Date (Current Location): 11/26/2020  [facility] and [place] Number:  [employer] and Address:  [place] [address] [place]      Provider Number: [id number]  Attending Physician Name and Address:  Kathie Dike, MD  Relative Name and Phone Number:  Chuck Alamillo - Wife - [phone]    Current Level of Care: Hospital Recommended Level of Care: [place] Prior Approval Number:    Date Approved/Denied:   PASRR Number:    Discharge Plan: SNF    Current Diagnoses: Patient Active Problem List   Diagnosis Date Noted   Transient ischemic attack 11/27/2020   Thrombocytopenia ([place]) 11/27/2020   Orthostatic hypotension 11/27/2020   CKD (chronic kidney disease) stage 3, GFR 30-59 ml/min ([place]) 11/27/2020   Hallucination 09/27/2019   Right inguinal hernia s/p laparoscopic repair 05/28/2017 05/28/2017   Left femoral hernia s/p laparoscopic repair 05/28/2017 05/28/2017   Cerumen impaction 06/05/2015   Stage 3 chronic kidney disease ([place]) 05/22/2015   Arthritis 06/04/2014   CHF (congestive heart failure), NYHA class III ([place]) 02/20/2014   BPH (benign prostatic hyperplasia) 02/20/2014   T wave inversion in EKG 02/20/2014   Coronary artery disease 11/25/2013   Essential hypertension    Unstable angina ([place]) 11/23/2013   Dyspnea on exertion 11/08/2013   [place]'s esophagus 10/27/2011   GERD (gastroesophageal reflux disease) 07/14/2011   CAD (coronary artery disease), native coronary artery - LIMA-LAD, SVG-Diag, SVG-RCA 2002    Hyperlipidemia LDL goal <70    Hypertensive heart disease     Orientation RESPIRATION BLADDER Height & Weight     Self  Normal External catheter Weight: 65.7 kg Height:  '5\' 10"'$  (177.8 cm)  BEHAVIORAL SYMPTOMS/MOOD NEUROLOGICAL BOWEL NUTRITION STATUS       Continent Diet (See DC summary)  AMBULATORY STATUS COMMUNICATION OF NEEDS Skin   Extensive Assist Verbally Normal                       Personal Care Assistance Level of Assistance  Bathing, Feeding, Dressing Bathing Assistance: Maximum assistance Feeding assistance: Limited assistance Dressing Assistance: Maximum assistance     Functional Limitations Info  Sight, Hearing, Speech Sight Info: Adequate Hearing Info: Impaired Speech Info: Adequate    SPECIAL CARE FACTORS FREQUENCY                       Contractures Contractures Info: Not present    Additional Factors Info  Code Status, Allergies Code Status Info: DNR Allergies Info: NKDA           Current Medications (11/29/2020):  This is the current hospital active medication list Current Facility-Administered Medications  Medication Dose Route Frequency Provider Last Rate Last Admin   amLODipine (NORVASC) tablet 2.5 mg  2.5 mg Oral Daily Memon, Jolaine Artist, MD   2.5 mg at 11/29/20 [id number]   aspirin EC tablet 81 mg  81 mg Oral Daily Adefeso, Oladapo, DO   81 mg at 11/29/20 0829   haloperidol lactate (HALDOL) injection 2 mg  2 mg Intramuscular Q6H PRN Phillips Odor, MD   2 mg at 11/29/20 0432   LORazepam (ATIVAN) injection 0.5 mg  0.5 mg Intravenous Q6H PRN Tat, Shanon Brow, MD   0.5 mg at 11/28/20 2144   memantine (NAMENDA) tablet 10 mg  10 mg Oral  BID Adefeso, Oladapo, DO   10 mg at 11/29/20 0830   metoprolol succinate (TOPROL-XL) 24 hr tablet 50 mg  50 mg Oral Daily Kathie Dike, MD   50 mg at 11/29/20 0830   mometasone-formoterol (DULERA) 200-5 MCG/ACT inhaler 2 puff  2 puff Inhalation BID Kathie Dike, MD   2 puff at 11/28/20 2017   OLANZapine (ZYPREXA) tablet 5 mg  5 mg Oral QHS [place], Kofi, MD   5 mg at 11/28/20 2052   pantoprazole (PROTONIX) EC tablet 40 mg  40 mg Oral Daily Adefeso, Oladapo, DO   40 mg at 11/29/20 0830   simvastatin (ZOCOR) tablet 20 mg  20 mg Oral QHS Adefeso, Oladapo, DO   20 mg at  11/28/20 2053     Discharge Medications: Please see discharge summary for a list of discharge medications.  Relevant Imaging Results:  Relevant Lab Results:   Additional Information SS# [id number]  Boneta Lucks, RN

## 2020-11-29 NOTE — TOC Progression Note (Signed)
30 Day Note   Patient Details  Name: Isaac [name] MRN: [id number] Date of Birth: 1940/09/05  [facility] CM/SW Contact  Boneta Lucks, RN Phone Number: [phone] 11/29/2020, 1:44 PM   Please be advised that [name] will require a short term nursing stay, anticipated 30 days or less rehabilitation and strengthening. The plan is for return home.

## 2020-11-29 NOTE — Progress Notes (Signed)
OT Cancellation Note  Patient Details Name: Isaac [name] MRN: [id number] DOB: [birth year]   Cancelled Treatment:    Reason Eval/Treat Not Completed: Medical issues which prohibited therapy. Pt given Haldol for agitation at 10:30pm and again at 5:55am, sleeping soundly when OT checked on pt earlier this am. Will check back at a later time as schedule allows.    Guadelupe Sabin, OTR/L  [phone] 11/29/2020, 8:59 AM

## 2020-11-30 LAB — CBC
HCT: 39.9 % (ref 39.0–52.0)
Hemoglobin: 13.5 g/dL (ref 13.0–17.0)
MCH: 32.7 pg (ref 26.0–34.0)
MCHC: 33.8 g/dL (ref 30.0–36.0)
MCV: 96.6 fL (ref 80.0–100.0)
Platelets: 154 10*3/uL (ref 150–400)
RBC: 4.13 MIL/uL — ABNORMAL LOW (ref 4.22–5.81)
RDW: 13 % (ref 11.5–15.5)
WBC: 9.6 10*3/uL (ref 4.0–10.5)
nRBC: 0 % (ref 0.0–0.2)

## 2020-11-30 LAB — BASIC METABOLIC PANEL
Anion gap: 9 (ref 5–15)
BUN: 22 mg/dL (ref 8–23)
CO2: 23 mmol/L (ref 22–32)
Calcium: 8.9 mg/dL (ref 8.9–10.3)
Chloride: 108 mmol/L (ref 98–111)
Creatinine, Ser: 1.49 mg/dL — ABNORMAL HIGH (ref 0.61–1.24)
GFR, Estimated: 47 mL/min — ABNORMAL LOW (ref >60)
Glucose, Bld: 85 mg/dL (ref 70–99)
Potassium: 3.8 mmol/L (ref 3.5–5.1)
Sodium: 140 mmol/L (ref 135–145)

## 2020-11-30 MED ORDER — QUETIAPINE FUMARATE 25 MG PO TABS
75.0000 mg | ORAL_TABLET | Freq: Every day | ORAL | Status: DC
  Administered 2020-11-30 – 2020-12-03 (×4): 75 mg via ORAL
  Filled 2020-11-30 (×4): qty 3

## 2020-11-30 NOTE — Care Management Important Message (Signed)
Important Message  Patient Details  Name: Isaac [name] MRN: [id number] Date of Birth: 05-15-1940   Medicare Important Message Given:  Yes     [name] 11/30/2020, 12:25 PM

## 2020-11-30 NOTE — Progress Notes (Signed)
OT Cancellation Note  Patient Details Name: TERRYION JAYARAMAN MRN: [id number] DOB: [birth year]   Cancelled Treatment:    Reason Eval/Treat Not Completed: Fatigue/lethargy limiting ability to participate. Pt sleeping soundly on OT arrival earlier this am, unable to awaken fully for participation. Will check back at a later time as schedule allows.    Guadelupe Sabin, OTR/L  [phone] 11/30/2020, 8:06 AM

## 2020-11-30 NOTE — Progress Notes (Signed)
PROGRESS NOTE  Isaac [name] [id number] DOB: 10-14-1940 DOA: 11/26/2020 PCP: Dettinger, Fransisca Kaufmann, MD  Brief History:  80 year old male with a history of dementia, CAD, CKD stage III, diastolic CHF, hypertension, hyperlipidemia , and pulmonary fibrosis presenting with 68-monthhistory of transient episodes of dizziness, gait instability, and slurred speech.  The patient is a poor historian secondary to his dementia.  History is obtained from review of the medical record and speaking with the patient spouse at the bedside.  Patient's spouse states that these episodes last about 15 to 20 minutes and have been happening almost on a daily basis for the past month.  She states that it usually occurs after he initially gets up to go to the kitchen.  After about 30 to 60 seconds from the time the patient stands, he begins to have episodes of dizziness and gait instability to the point he has had to hold onto the wall.  She describes this partly as as his legs moving funny.  She also states that he has what she describes as slurred speech during these episodes.  During these episodes, the patient denies any headache, visual disturbance, chest pain, shortness of breath.  There is no syncope.  He usually returns back to his usual baseline without any focal deficits after about 20 minutes.  She states that he has been started on some new medication about 1 month ago, but she does not recall the medicine. Over the past 2 months, the spouse states that the patient has had continued functional and cognitive decline.  He has not had any fevers, chills, chest pain, nausea, vomiting, diarrhea, abdominal pain, dysuria, hematuria.  Assessment/Plan: Gait instability/slurred speech -Suspect that orthostatic hypotension/transient hypotension is causing these episodes -MRI brain without any acute findings of stroke -EEG without any signs of seizures -Serum B12, low, replace -Folic acid -TSH 10000000-PT evaluation  currently recommending home health with supervision, will assess to reassess since it is only patient and his wife currently at home -UA negative for pyuria -Seen by neurology who thought symptoms may be related to progression of dementia/sundowning -Adjusting Seroquel dose so that patient is not agitated/excessively drowsy  Orthostatic hypotension -Start IV fluids -Recheck orthostatics  Essential hypertension -Blood pressures have been trending up -Restarted metoprolol and amlodipine  CKD stage IIIb -Baseline creatinine 1.5-1.7 -Monitor  Dementia with behavioral disturbance -Continue Namenda -Started on Seroquel to help treat sundowning and agitation -Seems to be excessively sleepy today, will reduce p.m. Seroquel dose  Thrombocytopenia -This appears to be chronic and intermittent dating back to 2018 -Work-up as above -Coags are normal  Hyperlipidemia -Continue statin  Pulmonary fibrosis -Patient follows with pulmonary, Dr. MVaughan Browner-pt will have to bring in Pirfenidone  Disposition -Patient's wife reports that it is only her and the patient who lives in their home -Reassessed by PT with recommendations for skilled nursing facility placement     Status is: Inpatient    Dispo: The patient is from: Home              Anticipated d/c is to: SNF              Patient currently is not medically stable to d/c.   Difficult to place patient No        Family Communication:   spouse updated at bedside 8/5  Consultants:  neurology  Code Status:  DNR  DVT Prophylaxis:  [place] Lovenox   Procedures: As Listed  in Progress Note Above  Antibiotics: None   Total time spent 35 minutes.  Greater than 50% spent face to face counseling and coordinating care.     Subjective: He is sleeping today.  Did receive some sedative medication overnight.  Started on Seroquel last night as well.  He briefly wakes up to voice, but then falls back asleep  Objective: Vitals:    11/29/20 1320 11/29/20 2052 11/30/20 0639 11/30/20 1353  BP: 129/79 117/69 (!) 143/57 (!) 177/68  Pulse: 73 80 73 (!) 59  Resp: '18 18 18 17  '$ Temp: 98.2 F (36.8 C) 98.4 F (36.9 C) 98 F (36.7 C) 97.6 F (36.4 C)  TempSrc: Oral Oral Oral Oral  SpO2: 99% 98% 97% 99%  Weight:      Height:        Intake/Output Summary (Last 24 hours) at 11/30/2020 1928 Last data filed at 11/30/2020 1613 Gross per 24 hour  Intake 1653.86 ml  Output 550 ml  Net 1103.86 ml   Weight change:  Exam:  General exam: Somnolent, briefly wakes up to voice but then falls back asleep Respiratory system: Clear to auscultation. Respiratory effort normal. Cardiovascular system:RRR. No murmurs, rubs, gallops. Gastrointestinal system: Abdomen is nondistended, soft and nontender. No organomegaly or masses felt. Normal bowel sounds heard. Central nervous system:  No focal neurological deficits. Extremities: No C/C/E, +pedal pulses Skin: No rashes, lesions or ulcers Psychiatry: Somnolent today, still has some confusion   Data Reviewed: I have personally reviewed following labs and imaging studies Basic Metabolic Panel: Recent Labs  Lab 11/26/20 1743 11/27/20 0346 11/30/20 0555  NA 137 139 140  K 4.1 3.7 3.8  CL 106 108 108  CO2 '25 25 23  '$ GLUCOSE 97 139* 85  BUN 26* 24* 22  CREATININE 1.69* 1.50* 1.49*  CALCIUM 8.7* 9.0 8.9  MG  --  1.9  --   PHOS  --  2.6  --    Liver Function Tests: Recent Labs  Lab 11/26/20 1743 11/27/20 0346  AST 15 16  ALT 10 10  ALKPHOS 67 67  BILITOT 0.5 0.6  PROT 7.3 7.2  ALBUMIN 3.9 3.7   No results for input(s): LIPASE, AMYLASE in the last 168 hours. No results for input(s): AMMONIA in the last 168 hours. Coagulation Profile: Recent Labs  Lab 11/27/20 0346  INR 1.1   CBC: Recent Labs  Lab 11/26/20 1743 11/27/20 0346 11/30/20 0555  WBC 7.7 8.6 9.6  NEUTROABS 4.9  --   --   HGB 12.9* 12.9* 13.5  HCT 37.9* 38.7* 39.9  MCV 97.9 98.2 96.6  PLT 147* 144*  154   Cardiac Enzymes: Recent Labs  Lab 11/27/20 0346  CKTOTAL 38*   BNP: Invalid input(s): POCBNP CBG: No results for input(s): GLUCAP in the last 168 hours. HbA1C: No results for input(s): HGBA1C in the last 72 hours.  Urine analysis:    Component Value Date/Time   COLORURINE YELLOW 11/26/2020 2318   APPEARANCEUR CLEAR 11/26/2020 2318   APPEARANCEUR Clear 04/11/2020 1035   LABSPEC 1.009 11/26/2020 2318   PHURINE 6.0 11/26/2020 2318   GLUCOSEU NEGATIVE 11/26/2020 2318   GLUCOSEU NEGATIVE 09/06/2012 0925   HGBUR NEGATIVE 11/26/2020 2318   BILIRUBINUR NEGATIVE 11/26/2020 2318   BILIRUBINUR Negative 04/11/2020 [place] 11/26/2020 2318   PROTEINUR NEGATIVE 11/26/2020 2318   UROBILINOGEN 0.2 09/06/2012 0925   NITRITE NEGATIVE 11/26/2020 2318   LEUKOCYTESUR NEGATIVE 11/26/2020 2318   Sepsis Labs: '@LABRCNTIP'$ (procalcitonin:4,lacticidven:4) ) Recent Results (  from the past 240 hour(s))  Resp Panel by RT-PCR (Flu A&B, Covid) Nasopharyngeal Swab     Status: None   Collection Time: 11/26/20  5:43 PM   Specimen: Nasopharyngeal Swab; Nasopharyngeal(NP) swabs in vial transport medium  Result Value Ref Range Status   SARS Coronavirus 2 by RT PCR NEGATIVE NEGATIVE Final    Comment: (NOTE) SARS-CoV-2 target nucleic acids are NOT DETECTED.  The SARS-CoV-2 RNA is generally detectable in upper respiratory specimens during the acute phase of infection. The lowest concentration of SARS-CoV-2 viral copies this assay can detect is 138 copies/mL. A negative result does not preclude SARS-Cov-2 infection and should not be used as the sole basis for treatment or other patient management decisions. A negative result may occur with  improper specimen collection/handling, submission of specimen other than nasopharyngeal swab, presence of viral mutation(s) within the areas targeted by this assay, and inadequate number of viral copies(<138 copies/mL). A negative result must be  combined with clinical observations, patient history, and epidemiological information. The expected result is Negative.  Fact Sheet for Patients:  EntrepreneurPulse.com.au  Fact Sheet for Healthcare Providers:  IncredibleEmployment.be  This test is no t yet approved or cleared by the [place] FDA and  has been authorized for detection and/or diagnosis of SARS-CoV-2 by FDA under an Emergency Use Authorization (EUA). This EUA will remain  in effect (meaning this test can be used) for the duration of the COVID-19 declaration under Section 564(b)(1) of the Act, 21 U.S.C.section 360bbb-3(b)(1), unless the authorization is terminated  or revoked sooner.       Influenza A by PCR NEGATIVE NEGATIVE Final   Influenza B by PCR NEGATIVE NEGATIVE Final    Comment: (NOTE) The Xpert Xpress SARS-CoV-2/FLU/RSV plus assay is intended as an aid in the diagnosis of influenza from Nasopharyngeal swab specimens and should not be used as a sole basis for treatment. Nasal washings and aspirates are unacceptable for Xpert Xpress SARS-CoV-2/FLU/RSV testing.  Fact Sheet for Patients: EntrepreneurPulse.com.au  Fact Sheet for Healthcare Providers: IncredibleEmployment.be  This test is not yet approved or cleared by the [place] FDA and has been authorized for detection and/or diagnosis of SARS-CoV-2 by FDA under an Emergency Use Authorization (EUA). This EUA will remain in effect (meaning this test can be used) for the duration of the COVID-19 declaration under Section 564(b)(1) of the Act, 21 U.S.C. section 360bbb-3(b)(1), unless the authorization is terminated or revoked.  Performed at [facility], [address] [place], [place] [address]      Scheduled Meds:  amLODipine  2.5 mg Oral Daily   aspirin EC  81 mg Oral Daily   memantine  10 mg Oral BID   metoprolol succinate  50 mg Oral Daily   mometasone-formoterol   2 puff Inhalation BID   pantoprazole  40 mg Oral Daily   QUEtiapine  75 mg Oral Daily   simvastatin  20 mg Oral QHS   Continuous Infusions:  lactated ringers 75 mL/hr at 11/30/20 1613     Procedures/Studies: DG Chest 2 View  Result Date: 11/15/2020 CLINICAL DATA:  Shortness of breath and cough slight cough for 1 year, history 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 Mediastinal contours and pulmonary vascularity normal. Emphysematous and bronchitic changes consistent with COPD. Bibasilar atelectasis with nodular density at LEFT lung base 10 mm diameter, not seen on previous exam but noted on 09/11/2016 study, likely nipple  shadow. No acute infiltrate, pleural effusion, or pneumothorax. Bones demineralized with chronic compression deformity at the thoracolumbar junction. IMPRESSION: COPD changes with bibasilar atelectasis versus scarring. Emphysema (ICD10-J43.9). Electronically Signed   By: Lavonia Dana M.D.   On: 11/15/2020 10:48   CT Head Wo Contrast  Result Date: 11/26/2020 CLINICAL DATA:  Mental status change, unknown cause Patient reports dizziness with multiple falls. EXAM: CT HEAD WITHOUT CONTRAST TECHNIQUE: Contiguous axial images were obtained from the base of the skull through the vertex without intravenous contrast. COMPARISON:  Head CT 02/18/2020 FINDINGS: Brain: No evidence of acute infarction, hemorrhage, hydrocephalus, extra-axial collection or mass lesion/mass effect. Stable brain volume and mild periventricular chronic small vessel ischemia. 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 Vascular: Atherosclerosis of skullbase vasculature without hyperdense vessel or abnormal calcification. Skull: No fracture or focal lesion. Sinuses/Orbits: Paranasal sinuses and mastoid air cells are clear. The visualized orbits are unremarkable. Bilateral cataract resection. Other:  None. IMPRESSION: 1. No acute intracranial abnormality. 2. Stable chronic small vessel ischemia. 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 Electronically Signed   By: Keith Rake M.D.   On: 11/26/2020 18:25   MR ANGIO HEAD WO CONTRAST  Result Date: 11/27/2020 CLINICAL DATA:  TIA; episodes of weakness in both legs EXAM: MRI HEAD WITHOUT CONTRAST MRA HEAD WITHOUT CONTRAST MRA NECK WITHOUT AND WITH CONTRAST TECHNIQUE: Multiplanar, multi-echo pulse sequences of the brain and surrounding structures were acquired without intravenous contrast. Angiographic images of the Circle of Willis were acquired using MRA technique without intravenous contrast. Angiographic images of the neck were acquired using MRA technique without and with intravenous contrast. Carotid stenosis measurements (when applicable) are obtained utilizing NASCET criteria, using the distal internal carotid diameter as the denominator. CONTRAST:  30m GADAVIST GADOBUTROL 1 MMOL/ML IV SOLN COMPARISON:  10/19/2019 FINDINGS: MRI HEAD Motion artifact is present. Brain: There is no acute infarction or intracranial hemorrhage. There is no intracranial mass, mass effect, or edema. There is no hydrocephalus or extra-axial fluid collection. Patchy T2 hyperintensity in the supratentorial white matter is nonspecific but probably reflects mild to moderate chronic microvascular ischemic changes. Prominence of the ventricles and sulci reflects minor generalized parenchymal volume loss. 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 Multiple small chronic cerebellar infarcts. Vascular: Major vessel flow voids at the skull base are preserved. Skull and upper cervical spine: Normal marrow signal is preserved. Sinuses/Orbits: Minor mucosal thickening. Bilateral lens replacements. Other: Sella is unremarkable.  Mastoid air cells are clear. MRA HEAD Intracranial internal carotid arteries are  patent. Middle and anterior cerebral arteries are patent. Intracranial vertebral arteries, basilar artery, posterior cerebral arteries are patent. There is no significant stenosis or aneurysm. MRA NECK Great vessel origins are patent. Common, internal, and external carotid arteries are patent. Extracranial codominant vertebral arteries are patent. There is no hemodynamically significant stenosis. Atherosclerotic irregularity at the left greater than right ICA origins. IMPRESSION: No acute infarction, hemorrhage, or mass. 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 No large vessel occlusion, hemodynamically significant stenosis, or evidence of dissection. Electronically Signed   By: PMacy MisM.D.   On: 11/27/2020 09:17   MR Angiogram Neck W or Wo Contrast  Result Date: 11/27/2020 CLINICAL DATA:  TIA; episodes of weakness in both legs EXAM: MRI HEAD WITHOUT CONTRAST MRA HEAD WITHOUT CONTRAST MRA NECK WITHOUT AND WITH CONTRAST TECHNIQUE: Multiplanar, multi-echo pulse sequences of the brain and surrounding structures were acquired without intravenous contrast.  Angiographic images of the Circle of Willis were acquired using MRA technique without intravenous contrast. Angiographic images of the neck were acquired using MRA technique without and with intravenous contrast. Carotid stenosis measurements (when applicable) are obtained utilizing NASCET criteria, using the distal internal carotid diameter as the denominator. CONTRAST:  17m GADAVIST GADOBUTROL 1 MMOL/ML IV SOLN COMPARISON:  10/19/2019 FINDINGS: MRI HEAD Motion artifact is present. Brain: There is no acute infarction or intracranial hemorrhage. There is no intracranial mass, mass effect, or edema. There is no hydrocephalus or extra-axial fluid collection. Patchy T2 hyperintensity in the supratentorial white matter is nonspecific but probably reflects mild to moderate chronic microvascular ischemic changes. Prominence of the ventricles  and sulci reflects minor generalized parenchymal volume loss. 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 Multiple small chronic cerebellar infarcts. Vascular: Major vessel flow voids at the skull base are preserved. Skull and upper cervical spine: Normal marrow signal is preserved. Sinuses/Orbits: Minor mucosal thickening. Bilateral lens replacements. Other: Sella is unremarkable.  Mastoid air cells are clear. MRA HEAD Intracranial internal carotid arteries are patent. Middle and anterior cerebral arteries are patent. Intracranial vertebral arteries, basilar artery, posterior cerebral arteries are patent. There is no significant stenosis or aneurysm. MRA NECK Great vessel origins are patent. Common, internal, and external carotid arteries are patent. Extracranial codominant vertebral arteries are patent. There is no hemodynamically significant stenosis. Atherosclerotic irregularity at the left greater than right ICA origins. IMPRESSION: No acute infarction, hemorrhage, or mass. 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 No large vessel occlusion, hemodynamically significant stenosis, or evidence of dissection. Electronically Signed   By: PMacy MisM.D.   On: 11/27/2020 09:17   MR BRAIN WO CONTRAST  Result Date: 11/27/2020 CLINICAL DATA:  TIA; episodes of weakness in both legs EXAM: MRI HEAD WITHOUT CONTRAST MRA HEAD WITHOUT CONTRAST MRA NECK WITHOUT AND WITH CONTRAST TECHNIQUE: Multiplanar, multi-echo pulse sequences of the brain and surrounding structures were acquired without intravenous contrast. Angiographic images of the Circle of Willis were acquired using MRA technique without intravenous contrast. Angiographic images of the neck were acquired using MRA technique without and with intravenous contrast. Carotid stenosis measurements (when applicable) are obtained utilizing NASCET criteria, using the distal internal  carotid diameter as the denominator. CONTRAST:  737mGADAVIST GADOBUTROL 1 MMOL/ML IV SOLN COMPARISON:  10/19/2019 FINDINGS: MRI HEAD Motion artifact is present. Brain: There is no acute infarction or intracranial hemorrhage. There is no intracranial mass, mass effect, or edema. There is no hydrocephalus or extra-axial fluid collection. Patchy T2 hyperintensity in the supratentorial white matter is nonspecific but probably reflects mild to moderate chronic microvascular ischemic changes. Prominence of the ventricles and sulci reflects minor generalized parenchymal volume loss. 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 Multiple small chronic cerebellar infarcts. Vascular: Major vessel flow voids at the skull base are preserved. Skull and upper cervical spine: Normal marrow signal is preserved. Sinuses/Orbits: Minor mucosal thickening. Bilateral lens replacements. Other: Sella is unremarkable.  Mastoid air cells are clear. MRA HEAD Intracranial internal carotid arteries are patent. Middle and anterior cerebral arteries are patent. Intracranial vertebral arteries, basilar artery, posterior cerebral arteries are patent. There is no significant stenosis or aneurysm. MRA NECK Great vessel origins are patent. Common, internal, and external carotid arteries are patent. Extracranial codominant vertebral arteries are patent. There is no hemodynamically significant stenosis. Atherosclerotic irregularity  at the left greater than right ICA origins. IMPRESSION: No acute infarction, hemorrhage, or mass. 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 No large vessel occlusion, hemodynamically significant stenosis, or evidence of dissection. Electronically Signed   By: Macy Mis M.D.   On: 11/27/2020 09:17   DG Chest Portable 1 View  Result Date: 11/26/2020 CLINICAL DATA:  Chest pain, near syncope EXAM: PORTABLE CHEST 1 VIEW COMPARISON:   11/14/2020 FINDINGS: Rotated AP portable examination with scarring and/or fibrosis at the bilateral lung bases. No acute appearing airspace opacity. Cardiomegaly status post median sternotomy and CABG. IMPRESSION: Rotated AP portable examination with scarring and/or fibrosis at the bilateral lung bases. No acute appearing airspace opacity. Electronically Signed   By: Eddie Candle M.D.   On: 11/26/2020 18:00   EEG adult  Result Date: 11/27/2020 Lora Havens, MD     11/27/2020 12:21 PM Patient Name: DEVENDRA FEUERHELM MRN: [id number] Epilepsy Attending: Lora Havens Referring Physician/Provider: Dr Bernadette Hoit Date: 11/27/2020 Duration: 24.10 mins Patient history: 80yo M with dizziness. EEG to evaluate for seizure Level of alertness: Awake AEDs during EEG study: None Technical aspects: This EEG study was done with scalp electrodes positioned according to the 10-20 International system of electrode placement. Electrical activity was acquired at a sampling rate of '500Hz'$  and reviewed with a high frequency filter of '70Hz'$  and a low frequency filter of '1Hz'$ . EEG data were recorded continuously and digitally stored. Description: The posterior dominant rhythm consists of '6Hz'$  activity of moderate voltage (25-35 uV) seen predominantly in posterior head regions, symmetric and reactive to eye opening and eye closing. Hyperventilation and photic stimulation were not performed.   ABNORMALITY - Background slow IMPRESSION: This study is suggestive of mild diffuse encephalopathy, nonspecific etiology. No seizures or epileptiform discharges were seen throughout the recording. Lora Havens   ECHOCARDIOGRAM COMPLETE  Result Date: 11/27/2020    ECHOCARDIOGRAM REPORT   Patient Name:   KEYSHAWN SNIDE Date of Exam: 11/27/2020 Medical Rec #:  [id number]     Height:       70.5 in Accession #:    [id number]    Weight:       148.0 lb Date of Birth:  09-12-1940     BSA:          1.846 m Patient Age:    89 years      BP:           146/86  mmHg Patient Gender: M             HR:           67 bpm. Exam Location:  [facility] Procedure: 2D Echo, Cardiac Doppler and Color Doppler Indications:    Stroke  History:        Patient has prior history of Echocardiogram examinations, most                 recent 09/08/2019. CHF, CAD, Prior CABG, Stroke,                 Signs/Symptoms:Dyspnea and Chest Pain; Risk Factors:Hypertension                 and Dyslipidemia.  Sonographer:    Wenda Low Referring Phys: [id number] OLADAPO ADEFESO IMPRESSIONS  1. Left ventricular ejection fraction, by estimation, is 55 to 60%. The left ventricle has normal function. The left ventricle demonstrates regional wall motion abnormalities (see scoring diagram/findings for description). There is mild left ventricular  hypertrophy.  Left ventricular diastolic parameters are consistent with Grade I diastolic dysfunction (impaired relaxation).  2. Right ventricular systolic function is normal. The right ventricular size is normal. There is normal pulmonary artery systolic pressure. The estimated right ventricular systolic pressure is 123456 mmHg.  3. Left atrial size was moderately dilated.  4. The mitral valve is degenerative. Moderate mitral valve regurgitation.  5. The aortic valve is tricuspid. Aortic valve regurgitation is mild to moderate. Aortic regurgitation PHT measures 552 msec. Aortic valve mean gradient measures 2.0 mmHg.  6. The inferior vena cava is normal in size with greater than 50% respiratory variability, suggesting right atrial pressure of 3 mmHg. Comparison(s): Prior images reviewed side by side. Inferoposterior wall motion abnormalitiy present on prior study. Mitral regurgitation is moderate at this point. FINDINGS  Left Ventricle: Left ventricular ejection fraction, by estimation, is 55 to 60%. The left ventricle has normal function. The left ventricle demonstrates regional wall motion abnormalities. The left ventricular internal cavity size was normal in size.  There is mild left ventricular hypertrophy. Left ventricular diastolic parameters are consistent with Grade I diastolic dysfunction (impaired relaxation).  LV Wall Scoring: The basal inferolateral segment, basal anterolateral segment, and basal inferior segment are akinetic. The entire anterior wall, mid and distal lateral wall, entire septum, entire apex, mid and distal inferior wall, and mid anterolateral segment are normal. Right Ventricle: The right ventricular size is normal. No increase in right ventricular wall thickness. Right ventricular systolic function is normal. There is normal pulmonary artery systolic pressure. The tricuspid regurgitant velocity is 2.32 m/s, and  with an assumed right atrial pressure of 3 mmHg, the estimated right ventricular systolic pressure is 123456 mmHg. Left Atrium: Left atrial size was moderately dilated. Right Atrium: Right atrial size was normal in size. Pericardium: There is no evidence of pericardial effusion. Mitral Valve: The mitral valve is degenerative in appearance. There is mild thickening of the mitral valve leaflet(s). Mild mitral annular calcification. Moderate mitral valve regurgitation, with eccentric posteriorly directed jet. MV peak gradient, 3.0 mmHg. The mean mitral valve gradient is 1.0 mmHg. Tricuspid Valve: The tricuspid valve is grossly normal. Tricuspid valve regurgitation is trivial. Aortic Valve: The aortic valve is tricuspid. There is mild aortic valve annular calcification. Aortic valve regurgitation is mild to moderate. Aortic regurgitation PHT measures 552 msec. Aortic valve mean gradient measures 2.0 mmHg. Aortic valve peak gradient measures 4.4 mmHg. Aortic valve area, by VTI measures 2.92 cm. Pulmonic Valve: The pulmonic valve was grossly normal. Pulmonic valve regurgitation is trivial. Aorta: The aortic root is normal in size and structure. Venous: The inferior vena cava is normal in size with greater than 50% respiratory variability, suggesting  right atrial pressure of 3 mmHg. IAS/Shunts: No atrial level shunt detected by color flow Doppler.  LEFT VENTRICLE PLAX 2D LVIDd:         5.18 cm      Diastology LVIDs:         2.86 cm      LV e' medial:    4.64 cm/s LV PW:         1.35 cm      LV E/e' medial:  9.5 LV IVS:        1.09 cm      LV e' lateral:   5.78 cm/s LVOT diam:     2.00 cm      LV E/e' lateral: 7.6 LV SV:         76 LV SV Index:   41  LVOT Area:     3.14 cm  LV Volumes (MOD) LV vol d, MOD A2C: 59.4 ml LV vol d, MOD A4C: 114.0 ml LV vol s, MOD A2C: 41.7 ml LV vol s, MOD A4C: 50.4 ml LV SV MOD A2C:     17.7 ml LV SV MOD A4C:     114.0 ml LV SV MOD BP:      37.1 ml RIGHT VENTRICLE RV Basal diam:  3.39 cm RV Mid diam:    2.85 cm RV S prime:     12.10 cm/s TAPSE (M-mode): 2.2 cm LEFT ATRIUM             Index       RIGHT ATRIUM           Index LA diam:        4.70 cm 2.55 cm/m  RA Area:     19.40 cm LA Vol (A2C):   69.6 ml 37.70 ml/m RA Volume:   48.80 ml  26.43 ml/m LA Vol (A4C):   80.2 ml 43.44 ml/m LA Biplane Vol: 75.7 ml 41.00 ml/m  AORTIC VALVE AV Area (Vmax):    2.62 cm AV Area (Vmean):   2.53 cm AV Area (VTI):     2.92 cm AV Vmax:           105.00 cm/s AV Vmean:          72.100 cm/s AV VTI:            0.259 m AV Peak Grad:      4.4 mmHg AV Mean Grad:      2.0 mmHg LVOT Vmax:         87.70 cm/s LVOT Vmean:        58.000 cm/s LVOT VTI:          0.241 m LVOT/AV VTI ratio: 0.93 AI PHT:            552 msec  AORTA Ao Root diam: 3.20 cm Ao Asc diam:  3.50 cm MITRAL VALVE               TRICUSPID VALVE MV Area (PHT): 3.15 cm    TR Peak grad:   21.5 mmHg MV Area VTI:   4.32 cm    TR Vmax:        232.00 cm/s MV Peak grad:  3.0 mmHg MV Mean grad:  1.0 mmHg    SHUNTS MV Vmax:       0.86 m/s    Systemic VTI:  0.24 m MV Vmean:      39.6 cm/s   Systemic Diam: 2.00 cm MV Decel Time: 241 msec MV E velocity: 44.20 cm/s MV A velocity: 62.70 cm/s MV E/A ratio:  0.70 Rozann Lesches MD Electronically signed by Rozann Lesches MD Signature Date/Time:  11/27/2020/11:28:33 AM    Final     Kathie Dike, MD  [facility]  If 7PM-7AM, please contact night-coverage [email or web address]  11/30/2020, 7:28 PM   LOS: 1 day

## 2020-12-01 MED ORDER — VITAMIN B-12 1000 MCG PO TABS: 1000.0000 ug | ORAL_TABLET | Freq: Every day | ORAL | Status: AC

## 2020-12-01 MED ORDER — ACETAMINOPHEN 325 MG PO TABS
650.0000 mg | ORAL_TABLET | Freq: Four times a day (QID) | ORAL | Status: DC | PRN
  Administered 2020-12-01: 650 mg via ORAL
  Filled 2020-12-01: qty 2

## 2020-12-01 MED ORDER — CYANOCOBALAMIN 1000 MCG/ML IJ SOLN
1000.0000 ug | Freq: Once | INTRAMUSCULAR | Status: AC
  Administered 2020-12-01: 1000 ug via INTRAMUSCULAR
  Filled 2020-12-01: qty 1

## 2020-12-01 MED ORDER — QUETIAPINE FUMARATE 25 MG PO TABS: 75.0000 mg | ORAL_TABLET | Freq: Every day | ORAL | Status: DC

## 2020-12-01 MED ORDER — METOPROLOL SUCCINATE ER 50 MG PO TB24: 50.0000 mg | ORAL_TABLET | Freq: Every day | ORAL | 3 refills | Status: AC

## 2020-12-01 NOTE — Plan of Care (Signed)
  Problem: Education: Goal: Knowledge of General Education information will improve Description Including pain rating scale, medication(s)/side effects and non-pharmacologic comfort measures Outcome: Progressing   

## 2020-12-01 NOTE — Progress Notes (Signed)
Unable to complete scheduled Q8 orthostatic vital signs

## 2020-12-01 NOTE — Discharge Summary (Signed)
Physician Discharge Summary  GENO DEBROUX [id number] DOB: 04/13/1941 DOA: 11/26/2020  PCP: Dettinger, Fransisca Kaufmann, MD  Admit date: 11/26/2020 Discharge date: 12/02/2020  Admitted From: Home Disposition: Skilled nursing facility  Recommendations for Outpatient Follow-up:  Follow up with PCP in 1-2 weeks Please obtain BMP/CBC in one week  Home Health: Equipment/Devices:  Discharge Condition: Stable CODE STATUS: DNR Diet recommendation: Heart healthy  Brief/Interim Summary: 80 year old male with a history of dementia, CAD, CKD stage III, diastolic CHF, hypertension, hyperlipidemia , and pulmonary fibrosis presenting with 47-monthhistory of transient episodes of dizziness, gait instability, and slurred speech.  The patient is a poor historian secondary to his dementia.  History is obtained from review of the medical record and speaking with the patient spouse at the bedside.  Patient's spouse states that these episodes last about 15 to 20 minutes and have been happening almost on a daily basis for the past month.  She states that it usually occurs after he initially gets up to go to the kitchen.  After about 30 to 60 seconds from the time the patient stands, he begins to have episodes of dizziness and gait instability to the point he has had to hold onto the wall.  She describes this partly as as his legs moving funny.  She also states that he has what she describes as slurred speech during these episodes.  During these episodes, the patient denies any headache, visual disturbance, chest pain, shortness of breath.  There is no syncope.  He usually returns back to his usual baseline without any focal deficits after about 20 minutes.  She states that he has been started on some new medication about 1 month ago, but she does not recall the medicine. Over the past 2 months, the spouse states that the patient has had continued functional and cognitive decline.  He has not had any fevers, chills, chest pain,  nausea, vomiting, diarrhea, abdominal pain, dysuria, hematuria  Discharge Diagnoses:  Principal Problem:   Transient ischemic attack Active Problems:   Hyperlipidemia LDL goal <70   GERD (gastroesophageal reflux disease)   Essential hypertension   Thrombocytopenia (HCC)   Orthostatic hypotension   CKD (chronic kidney disease) stage 3, GFR 30-59 ml/min (HCC)   Delirium  Gait instability/slurred speech -Suspect that orthostatic hypotension/transient hypotension is causing these episodes -MRI brain without any acute findings of stroke -EEG without any signs of seizures -Serum B12, low at [id number] replace -Folic acid normal -TSH 1.720 -UA negative for pyuria -Seen by neurology who thought symptoms may be related to progression of dementia/sundowning -Patient started on Seroquel nightly which seems to have improved her symptoms   Orthostatic hypotension -Improved with IV fluids   Essential hypertension -Blood pressures have been trending up -Restarted metoprolol and amlodipine   CKD stage IIIb -Baseline creatinine 1.5-1.7 -Monitor   Dementia with behavioral disturbance -Continue Namenda -Started on Seroquel to help treat sundowning and agitation -Overall mental status appears to be more calm and cooperative now   Thrombocytopenia -This appears to be chronic and intermittent dating back to 2018 -Work-up as above -Coags are normal   Hyperlipidemia -Continue statin   Pulmonary fibrosis -Patient follows with pulmonary, Dr. MVaughan Browner-Continue on pirfenidone   Disposition -Patient's wife reports that it is only her and the patient who lives in their home -Reassessed by PT with recommendations for skilled nursing facility placement  Discharge Instructions   Allergies as of 12/01/2020   No Known Allergies      Medication List  TAKE these medications    albuterol 108 (90 Base) MCG/ACT inhaler Commonly known as: VENTOLIN HFA Inhale 2 puffs into the lungs every 6  (six) hours as needed for wheezing or shortness of breath.   amLODipine 2.5 MG tablet Commonly known as: NORVASC Take 2.5 mg by mouth daily.   aspirin 81 MG EC tablet Take 81 mg by mouth every morning.   docusate sodium 100 MG capsule Commonly known as: COLACE Take 1 capsule by mouth every other day   Esbriet 267 MG Tabs Generic drug: Pirfenidone TAKE 2 TABLETS (534 MG TOTAL) BY MOUTH IN THE MORNING, AT NOON, AND AT BEDTIME.   fluticasone 50 MCG/ACT nasal spray Commonly known as: FLONASE Place 2 sprays into both nostrils daily as needed for allergies or rhinitis.   Fluticasone-Salmeterol 250-50 MCG/DOSE Aepb Commonly known as: Advair Diskus Inhale 1 puff into the lungs 2 (two) times daily.   furosemide 40 MG tablet Commonly known as: LASIX Take 1 tablet (40 mg total) by mouth daily as needed for fluid or edema.   memantine 10 MG tablet Commonly known as: Namenda Take 1 tablet (10 mg total) by mouth 2 (two) times daily. Start after finishing the namenda starter pack first   metoprolol succinate 50 MG 24 hr tablet Commonly known as: TOPROL-XL Take 1 tablet (50 mg total) by mouth daily. What changed:  how much to take how to take this when to take this additional instructions   multivitamin with minerals Tabs tablet Take 1 tablet by mouth daily.   multivitamin-lutein Caps capsule Take 1 capsule by mouth daily.   nitroGLYCERIN 0.4 MG SL tablet Commonly known as: NITROSTAT DISSOLVE ONE TABLET UNDER THE TONGUE EVERY 5 MINUTES AS NEEDED FOR CHEST PAIN.  DO NOT EXCEED A TOTAL OF 3 DOSES IN 15 MINUTES What changed: See the new instructions.   omeprazole 20 MG capsule Commonly known as: PRILOSEC Take 1 capsule (20 mg total) by mouth 2 (two) times daily.   ondansetron 4 MG tablet Commonly known as: ZOFRAN TAKE 1 TABLET BY MOUTH THREE TIMES DAILY AS NEEDED FOR NAUSEA AND VOMITING What changed: See the new instructions.   potassium chloride SA 20 MEQ tablet Commonly  known as: Klor-Con M20 TAKE ONE TABLET BY MOUTH ONCE DAILY WHEN  YOU  TAKE LASIX   QUEtiapine 25 MG tablet Commonly known as: SEROQUEL Take 3 tablets (75 mg total) by mouth at bedtime.   simethicone 125 MG chewable tablet Commonly known as: MYLICON Chew 0000000 mg by mouth every 6 (six) hours as needed for flatulence.   simvastatin 20 MG tablet Commonly known as: ZOCOR TAKE 1 TABLET BY MOUTH AT BEDTIME   vitamin B-12 1000 MCG tablet Commonly known as: CYANOCOBALAMIN Take 1 tablet (1,000 mcg total) by mouth daily.   Vitamin D 50 MCG (2000 UT) tablet Take 2,000 Units by mouth daily.        Follow-up Information     [facility] Follow up.   Why: RN/PT/SW will call within 48 hours to set up your first home visit.               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 Mediastinal contours and pulmonary vascularity normal.  Emphysematous and bronchitic changes consistent with COPD. Bibasilar atelectasis with nodular density at LEFT lung base 10 mm diameter, not seen on previous exam but noted on 09/11/2016 study, likely nipple shadow. No acute infiltrate, pleural effusion, or pneumothorax. Bones demineralized with chronic compression deformity at the thoracolumbar junction. IMPRESSION: COPD changes with bibasilar atelectasis versus scarring. Emphysema (ICD10-J43.9). Electronically Signed   By: Lavonia Dana M.D.   On: 11/15/2020 10:48   CT Head Wo Contrast  Result Date: 11/26/2020 CLINICAL DATA:  Mental status change, unknown cause Patient reports dizziness with multiple falls. EXAM: CT HEAD WITHOUT CONTRAST TECHNIQUE: Contiguous axial images were obtained from the base of the skull  through the vertex without intravenous contrast. COMPARISON:  Head CT 02/18/2020 FINDINGS: Brain: No evidence of acute infarction, hemorrhage, hydrocephalus, extra-axial collection or mass lesion/mass effect. Stable brain volume and mild periventricular chronic small vessel ischemia. 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 Vascular: Atherosclerosis of skullbase vasculature without hyperdense vessel or abnormal calcification. Skull: No fracture or focal lesion. Sinuses/Orbits: Paranasal sinuses and mastoid air cells are clear. The visualized orbits are unremarkable. Bilateral cataract resection. Other: None. IMPRESSION: 1. No acute intracranial abnormality. 2. Stable chronic small vessel ischemia. 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 Electronically Signed   By: Keith Rake M.D.   On: 11/26/2020 18:25   MR ANGIO HEAD WO CONTRAST  Result Date: 11/27/2020 CLINICAL DATA:  TIA; episodes of weakness in both legs EXAM: MRI HEAD WITHOUT CONTRAST MRA HEAD WITHOUT CONTRAST MRA NECK WITHOUT AND WITH CONTRAST TECHNIQUE: Multiplanar, multi-echo pulse sequences of the brain and surrounding structures were acquired without intravenous contrast. Angiographic images of the Circle of Willis were acquired using MRA technique without intravenous contrast. Angiographic images of the neck were acquired using MRA technique without and with intravenous contrast. Carotid stenosis measurements (when applicable) are obtained utilizing NASCET criteria, using the distal internal carotid diameter as the denominator. CONTRAST:  28m GADAVIST GADOBUTROL 1 MMOL/ML IV SOLN COMPARISON:  10/19/2019 FINDINGS: MRI HEAD Motion artifact is present. Brain: There is no acute infarction or intracranial hemorrhage. There is no intracranial mass, mass effect, or edema. There is no hydrocephalus or extra-axial fluid collection. Patchy T2 hyperintensity in the supratentorial white matter is nonspecific but probably reflects  mild to moderate chronic microvascular ischemic changes. Prominence of the ventricles and sulci reflects minor generalized parenchymal volume loss. 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 Multiple small chronic cerebellar infarcts. Vascular: Major vessel flow voids at the skull base are preserved. Skull and upper cervical spine: Normal marrow signal is preserved. Sinuses/Orbits: Minor mucosal thickening. Bilateral lens replacements. Other: Sella is unremarkable.  Mastoid air cells are clear. MRA HEAD Intracranial internal carotid arteries are patent. Middle and anterior cerebral arteries are patent. Intracranial vertebral arteries, basilar artery, posterior cerebral arteries are patent. There is no significant stenosis or aneurysm. MRA NECK Great vessel origins are patent. Common, internal, and external carotid arteries are patent. Extracranial codominant vertebral arteries are patent. There is no hemodynamically significant stenosis. Atherosclerotic irregularity at the left greater than right ICA origins. IMPRESSION: No acute infarction, hemorrhage, or mass. 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 No large vessel occlusion, hemodynamically significant stenosis, or evidence of dissection. Electronically Signed   By: PMacy MisM.D.   On: 11/27/2020 09:17   MR Angiogram Neck W or Wo Contrast  Result Date: 11/27/2020 CLINICAL DATA:  TIA; episodes of weakness in both legs  EXAM: MRI HEAD WITHOUT CONTRAST MRA HEAD WITHOUT CONTRAST MRA NECK WITHOUT AND WITH CONTRAST TECHNIQUE: Multiplanar, multi-echo pulse sequences of the brain and surrounding structures were acquired without intravenous contrast. Angiographic images of the Circle of Willis were acquired using MRA technique without intravenous contrast. Angiographic images of the neck were acquired using MRA technique without and with intravenous contrast. Carotid  stenosis measurements (when applicable) are obtained utilizing NASCET criteria, using the distal internal carotid diameter as the denominator. CONTRAST:  21m GADAVIST GADOBUTROL 1 MMOL/ML IV SOLN COMPARISON:  10/19/2019 FINDINGS: MRI HEAD Motion artifact is present. Brain: There is no acute infarction or intracranial hemorrhage. There is no intracranial mass, mass effect, or edema. There is no hydrocephalus or extra-axial fluid collection. Patchy T2 hyperintensity in the supratentorial white matter is nonspecific but probably reflects mild to moderate chronic microvascular ischemic changes. Prominence of the ventricles and sulci reflects minor generalized parenchymal volume loss. 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 Multiple small chronic cerebellar infarcts. Vascular: Major vessel flow voids at the skull base are preserved. Skull and upper cervical spine: Normal marrow signal is preserved. Sinuses/Orbits: Minor mucosal thickening. Bilateral lens replacements. Other: Sella is unremarkable.  Mastoid air cells are clear. MRA HEAD Intracranial internal carotid arteries are patent. Middle and anterior cerebral arteries are patent. Intracranial vertebral arteries, basilar artery, posterior cerebral arteries are patent. There is no significant stenosis or aneurysm. MRA NECK Great vessel origins are patent. Common, internal, and external carotid arteries are patent. Extracranial codominant vertebral arteries are patent. There is no hemodynamically significant stenosis. Atherosclerotic irregularity at the left greater than right ICA origins. IMPRESSION: No acute infarction, hemorrhage, or mass. 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 No large vessel occlusion, hemodynamically significant stenosis, or evidence of dissection. Electronically Signed   By: PMacy MisM.D.   On: 11/27/2020 09:17   MR BRAIN WO CONTRAST  Result  Date: 11/27/2020 CLINICAL DATA:  TIA; episodes of weakness in both legs EXAM: MRI HEAD WITHOUT CONTRAST MRA HEAD WITHOUT CONTRAST MRA NECK WITHOUT AND WITH CONTRAST TECHNIQUE: Multiplanar, multi-echo pulse sequences of the brain and surrounding structures were acquired without intravenous contrast. Angiographic images of the Circle of Willis were acquired using MRA technique without intravenous contrast. Angiographic images of the neck were acquired using MRA technique without and with intravenous contrast. Carotid stenosis measurements (when applicable) are obtained utilizing NASCET criteria, using the distal internal carotid diameter as the denominator. CONTRAST:  722mGADAVIST GADOBUTROL 1 MMOL/ML IV SOLN COMPARISON:  10/19/2019 FINDINGS: MRI HEAD Motion artifact is present. Brain: There is no acute infarction or intracranial hemorrhage. There is no intracranial mass, mass effect, or edema. There is no hydrocephalus or extra-axial fluid collection. Patchy T2 hyperintensity in the supratentorial white matter is nonspecific but probably reflects mild to moderate chronic microvascular ischemic changes. Prominence of the ventricles and sulci reflects minor generalized parenchymal volume loss. 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 Multiple small chronic cerebellar infarcts. Vascular: Major vessel flow voids at the skull base are preserved. Skull and upper cervical spine: Normal marrow signal is preserved. Sinuses/Orbits: Minor mucosal thickening. Bilateral lens replacements. Other: Sella is unremarkable.  Mastoid air cells are clear. MRA HEAD Intracranial internal carotid arteries are patent. Middle and anterior cerebral arteries are patent. Intracranial vertebral arteries, basilar artery, posterior cerebral arteries are patent. There is no significant stenosis  or aneurysm. MRA NECK Great vessel origins are patent. Common, internal, and external  carotid arteries are patent. Extracranial codominant vertebral arteries are patent. There is no hemodynamically significant stenosis. Atherosclerotic irregularity at the left greater than right ICA origins. IMPRESSION: No acute infarction, hemorrhage, or mass. 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 No large vessel occlusion, hemodynamically significant stenosis, or evidence of dissection. Electronically Signed   By: Macy Mis M.D.   On: 11/27/2020 09:17   DG Chest Portable 1 View  Result Date: 11/26/2020 CLINICAL DATA:  Chest pain, near syncope EXAM: PORTABLE CHEST 1 VIEW COMPARISON:  11/14/2020 FINDINGS: Rotated AP portable examination with scarring and/or fibrosis at the bilateral lung bases. No acute appearing airspace opacity. Cardiomegaly status post median sternotomy and CABG. IMPRESSION: Rotated AP portable examination with scarring and/or fibrosis at the bilateral lung bases. No acute appearing airspace opacity. Electronically Signed   By: Eddie Candle M.D.   On: 11/26/2020 18:00   EEG adult  Result Date: 11/27/2020 Lora Havens, MD     11/27/2020 12:21 PM Patient Name: HAZIM MORTER MRN: [id number] Epilepsy Attending: Lora Havens Referring Physician/Provider: Dr Bernadette Hoit Date: 11/27/2020 Duration: 24.10 mins Patient history: 80yo M with dizziness. EEG to evaluate for seizure Level of alertness: Awake AEDs during EEG study: None Technical aspects: This EEG study was done with scalp electrodes positioned according to the 10-20 International system of electrode placement. Electrical activity was acquired at a sampling rate of '500Hz'$  and reviewed with a high frequency filter of '70Hz'$  and a low frequency filter of '1Hz'$ . EEG data were recorded continuously and digitally stored. Description: The posterior dominant rhythm consists of '6Hz'$  activity of moderate voltage (25-35 uV) seen predominantly in posterior head regions, symmetric and reactive to eye opening and eye  closing. Hyperventilation and photic stimulation were not performed.   ABNORMALITY - Background slow IMPRESSION: This study is suggestive of mild diffuse encephalopathy, nonspecific etiology. No seizures or epileptiform discharges were seen throughout the recording. Lora Havens   ECHOCARDIOGRAM COMPLETE  Result Date: 11/27/2020    ECHOCARDIOGRAM REPORT   Patient Name:   JONOVAN RASHED Date of Exam: 11/27/2020 Medical Rec #:  [id number]     Height:       70.5 in Accession #:    [id number]    Weight:       148.0 lb Date of Birth:  Feb 20, 1941     BSA:          1.846 m Patient Age:    80 years      BP:           146/86 mmHg Patient Gender: M             HR:           67 bpm. Exam Location:  [facility] Procedure: 2D Echo, Cardiac Doppler and Color Doppler Indications:    Stroke  History:        Patient has prior history of Echocardiogram examinations, most                 recent 09/08/2019. CHF, CAD, Prior CABG, Stroke,                 Signs/Symptoms:Dyspnea and Chest Pain; Risk Factors:Hypertension                 and Dyslipidemia.  Sonographer:    Wenda Low Referring Phys: [id number] OLADAPO ADEFESO IMPRESSIONS  1. Left ventricular ejection fraction, by  estimation, is 55 to 60%. The left ventricle has normal function. The left ventricle demonstrates regional wall motion abnormalities (see scoring diagram/findings for description). There is mild left ventricular  hypertrophy. Left ventricular diastolic parameters are consistent with Grade I diastolic dysfunction (impaired relaxation).  2. Right ventricular systolic function is normal. The right ventricular size is normal. There is normal pulmonary artery systolic pressure. The estimated right ventricular systolic pressure is 123456 mmHg.  3. Left atrial size was moderately dilated.  4. The mitral valve is degenerative. Moderate mitral valve regurgitation.  5. The aortic valve is tricuspid. Aortic valve regurgitation is mild to moderate. Aortic regurgitation PHT  measures 552 msec. Aortic valve mean gradient measures 2.0 mmHg.  6. The inferior vena cava is normal in size with greater than 50% respiratory variability, suggesting right atrial pressure of 3 mmHg. Comparison(s): Prior images reviewed side by side. Inferoposterior wall motion abnormalitiy present on prior study. Mitral regurgitation is moderate at this point. FINDINGS  Left Ventricle: Left ventricular ejection fraction, by estimation, is 55 to 60%. The left ventricle has normal function. The left ventricle demonstrates regional wall motion abnormalities. The left ventricular internal cavity size was normal in size. There is mild left ventricular hypertrophy. Left ventricular diastolic parameters are consistent with Grade I diastolic dysfunction (impaired relaxation).  LV Wall Scoring: The basal inferolateral segment, basal anterolateral segment, and basal inferior segment are akinetic. The entire anterior wall, mid and distal lateral wall, entire septum, entire apex, mid and distal inferior wall, and mid anterolateral segment are normal. Right Ventricle: The right ventricular size is normal. No increase in right ventricular wall thickness. Right ventricular systolic function is normal. There is normal pulmonary artery systolic pressure. The tricuspid regurgitant velocity is 2.32 m/s, and  with an assumed right atrial pressure of 3 mmHg, the estimated right ventricular systolic pressure is 123456 mmHg. Left Atrium: Left atrial size was moderately dilated. Right Atrium: Right atrial size was normal in size. Pericardium: There is no evidence of pericardial effusion. Mitral Valve: The mitral valve is degenerative in appearance. There is mild thickening of the mitral valve leaflet(s). Mild mitral annular calcification. Moderate mitral valve regurgitation, with eccentric posteriorly directed jet. MV peak gradient, 3.0 mmHg. The mean mitral valve gradient is 1.0 mmHg. Tricuspid Valve: The tricuspid valve is grossly  normal. Tricuspid valve regurgitation is trivial. Aortic Valve: The aortic valve is tricuspid. There is mild aortic valve annular calcification. Aortic valve regurgitation is mild to moderate. Aortic regurgitation PHT measures 552 msec. Aortic valve mean gradient measures 2.0 mmHg. Aortic valve peak gradient measures 4.4 mmHg. Aortic valve area, by VTI measures 2.92 cm. Pulmonic Valve: The pulmonic valve was grossly normal. Pulmonic valve regurgitation is trivial. Aorta: The aortic root is normal in size and structure. Venous: The inferior vena cava is normal in size with greater than 50% respiratory variability, suggesting right atrial pressure of 3 mmHg. IAS/Shunts: No atrial level shunt detected by color flow Doppler.  LEFT VENTRICLE PLAX 2D LVIDd:         5.18 cm      Diastology LVIDs:         2.86 cm      LV e' medial:    4.64 cm/s LV PW:         1.35 cm      LV E/e' medial:  9.5 LV IVS:        1.09 cm      LV e' lateral:   5.78 cm/s LVOT diam:  2.00 cm      LV E/e' lateral: 7.6 LV SV:         76 LV SV Index:   41 LVOT Area:     3.14 cm  LV Volumes (MOD) LV vol d, MOD A2C: 59.4 ml LV vol d, MOD A4C: 114.0 ml LV vol s, MOD A2C: 41.7 ml LV vol s, MOD A4C: 50.4 ml LV SV MOD A2C:     17.7 ml LV SV MOD A4C:     114.0 ml LV SV MOD BP:      37.1 ml RIGHT VENTRICLE RV Basal diam:  3.39 cm RV Mid diam:    2.85 cm RV S prime:     12.10 cm/s TAPSE (M-mode): 2.2 cm LEFT ATRIUM             Index       RIGHT ATRIUM           Index LA diam:        4.70 cm 2.55 cm/m  RA Area:     19.40 cm LA Vol (A2C):   69.6 ml 37.70 ml/m RA Volume:   48.80 ml  26.43 ml/m LA Vol (A4C):   80.2 ml 43.44 ml/m LA Biplane Vol: 75.7 ml 41.00 ml/m  AORTIC VALVE AV Area (Vmax):    2.62 cm AV Area (Vmean):   2.53 cm AV Area (VTI):     2.92 cm AV Vmax:           105.00 cm/s AV Vmean:          72.100 cm/s AV VTI:            0.259 m AV Peak Grad:      4.4 mmHg AV Mean Grad:      2.0 mmHg LVOT Vmax:         87.70 cm/s LVOT Vmean:         58.000 cm/s LVOT VTI:          0.241 m LVOT/AV VTI ratio: 0.93 AI PHT:            552 msec  AORTA Ao Root diam: 3.20 cm Ao Asc diam:  3.50 cm MITRAL VALVE               TRICUSPID VALVE MV Area (PHT): 3.15 cm    TR Peak grad:   21.5 mmHg MV Area VTI:   4.32 cm    TR Vmax:        232.00 cm/s MV Peak grad:  3.0 mmHg MV Mean grad:  1.0 mmHg    SHUNTS MV Vmax:       0.86 m/s    Systemic VTI:  0.24 m MV Vmean:      39.6 cm/s   Systemic Diam: 2.00 cm MV Decel Time: 241 msec MV E velocity: 44.20 cm/s MV A velocity: 62.70 cm/s MV E/A ratio:  0.70 Rozann Lesches MD Electronically signed by Rozann Lesches MD Signature Date/Time: 11/27/2020/11:28:33 AM    Final       Subjective: Family reports that patient is more awake today and was able to eat breakfast  Discharge Exam: Vitals:   11/30/20 2058 12/01/20 0628 12/01/20 1454 12/01/20 2009  BP: (!) 188/97 (!) 157/79 132/89   Pulse: 85 78 73   Resp: 20 18    Temp: 99.2 F (37.3 C) 99.3 F (37.4 C) 99.6 F (37.6 C)   TempSrc: Oral Oral Oral   SpO2: 100% 97% 98% 97%  Weight:  Height:        General: Pt is alert, awake, not in acute distress Cardiovascular: RRR, S1/S2 +, no rubs, no gallops Respiratory: CTA bilaterally, no wheezing, no rhonchi Abdominal: Soft, NT, ND, bowel sounds + Extremities: no edema, no cyanosis    The results of significant diagnostics from this hospitalization (including imaging, microbiology, ancillary and laboratory) are listed below for reference.     Microbiology: Recent Results (from the past 240 hour(s))  Resp Panel by RT-PCR (Flu A&B, Covid) Nasopharyngeal Swab     Status: None   Collection Time: 11/26/20  5:43 PM   Specimen: Nasopharyngeal Swab; Nasopharyngeal(NP) swabs in vial transport medium  Result Value Ref Range Status   SARS Coronavirus 2 by RT PCR NEGATIVE NEGATIVE Final    Comment: (NOTE) SARS-CoV-2 target nucleic acids are NOT DETECTED.  The SARS-CoV-2 RNA is generally detectable in upper  respiratory specimens during the acute phase of infection. The lowest concentration of SARS-CoV-2 viral copies this assay can detect is 138 copies/mL. A negative result does not preclude SARS-Cov-2 infection and should not be used as the sole basis for treatment or other patient management decisions. A negative result may occur with  improper specimen collection/handling, submission of specimen other than nasopharyngeal swab, presence of viral mutation(s) within the areas targeted by this assay, and inadequate number of viral copies(<138 copies/mL). A negative result must be combined with clinical observations, patient history, and epidemiological information. The expected result is Negative.  Fact Sheet for Patients:  EntrepreneurPulse.com.au  Fact Sheet for Healthcare Providers:  IncredibleEmployment.be  This test is no t yet approved or cleared by the [place] FDA and  has been authorized for detection and/or diagnosis of SARS-CoV-2 by FDA under an Emergency Use Authorization (EUA). This EUA will remain  in effect (meaning this test can be used) for the duration of the COVID-19 declaration under Section 564(b)(1) of the Act, 21 U.S.C.section 360bbb-3(b)(1), unless the authorization is terminated  or revoked sooner.       Influenza A by PCR NEGATIVE NEGATIVE Final   Influenza B by PCR NEGATIVE NEGATIVE Final    Comment: (NOTE) The Xpert Xpress SARS-CoV-2/FLU/RSV plus assay is intended as an aid in the diagnosis of influenza from Nasopharyngeal swab specimens and should not be used as a sole basis for treatment. Nasal washings and aspirates are unacceptable for Xpert Xpress SARS-CoV-2/FLU/RSV testing.  Fact Sheet for Patients: EntrepreneurPulse.com.au  Fact Sheet for Healthcare Providers: IncredibleEmployment.be  This test is not yet approved or cleared by the [place] FDA and has been  authorized for detection and/or diagnosis of SARS-CoV-2 by FDA under an Emergency Use Authorization (EUA). This EUA will remain in effect (meaning this test can be used) for the duration of the COVID-19 declaration under Section 564(b)(1) of the Act, 21 U.S.C. section 360bbb-3(b)(1), unless the authorization is terminated or revoked.  Performed at [facility], [address] [place],  [address]      Labs: BNP (last 3 results) Recent Labs    09/13/20 1443  BNP [id number]*   Basic Metabolic Panel: Recent Labs  Lab 11/26/20 1743 11/27/20 0346 11/30/20 0555  NA 137 139 140  K 4.1 3.7 3.8  CL 106 108 108  CO2 '25 25 23  '$ GLUCOSE 97 139* 85  BUN 26* 24* 22  CREATININE 1.69* 1.50* 1.49*  CALCIUM 8.7* 9.0 8.9  MG  --  1.9  --   PHOS  --  2.6  --    Liver Function Tests:  Recent Labs  Lab 11/26/20 1743 11/27/20 0346  AST 15 16  ALT 10 10  ALKPHOS 67 67  BILITOT 0.5 0.6  PROT 7.3 7.2  ALBUMIN 3.9 3.7   No results for input(s): LIPASE, AMYLASE in the last 168 hours. No results for input(s): AMMONIA in the last 168 hours. CBC: Recent Labs  Lab 11/26/20 1743 11/27/20 0346 11/30/20 0555  WBC 7.7 8.6 9.6  NEUTROABS 4.9  --   --   HGB 12.9* 12.9* 13.5  HCT 37.9* 38.7* 39.9  MCV 97.9 98.2 96.6  PLT 147* 144* 154   Cardiac Enzymes: Recent Labs  Lab 11/27/20 0346  CKTOTAL 38*   BNP: Invalid input(s): POCBNP CBG: No results for input(s): GLUCAP in the last 168 hours. D-Dimer No results for input(s): DDIMER in the last 72 hours. Hgb A1c No results for input(s): HGBA1C in the last 72 hours. Lipid Profile No results for input(s): CHOL, HDL, LDLCALC, TRIG, CHOLHDL, LDLDIRECT in the last 72 hours. Thyroid function studies No results for input(s): TSH, T4TOTAL, T3FREE, THYROIDAB in the last 72 hours.  Invalid input(s): FREET3 Anemia work up No results for input(s): VITAMINB12, FOLATE, FERRITIN, TIBC, IRON, RETICCTPCT in the last 72 hours. Urinalysis     Component Value Date/Time   COLORURINE YELLOW 11/26/2020 2318   APPEARANCEUR CLEAR 11/26/2020 2318   APPEARANCEUR Clear 04/11/2020 1035   LABSPEC 1.009 11/26/2020 2318   PHURINE 6.0 11/26/2020 2318   GLUCOSEU NEGATIVE 11/26/2020 2318   GLUCOSEU NEGATIVE 09/06/2012 0925   HGBUR NEGATIVE 11/26/2020 2318   BILIRUBINUR NEGATIVE 11/26/2020 2318   BILIRUBINUR Negative 04/11/2020 1035   KETONESUR NEGATIVE 11/26/2020 2318   PROTEINUR NEGATIVE 11/26/2020 2318   UROBILINOGEN 0.2 09/06/2012 0925   NITRITE NEGATIVE 11/26/2020 2318   LEUKOCYTESUR NEGATIVE 11/26/2020 2318   Sepsis Labs Invalid input(s): PROCALCITONIN,  WBC,  LACTICIDVEN Microbiology Recent Results (from the past 240 hour(s))  Resp Panel by RT-PCR (Flu A&B, Covid) Nasopharyngeal Swab     Status: None   Collection Time: 11/26/20  5:43 PM   Specimen: Nasopharyngeal Swab; Nasopharyngeal(NP) swabs in vial transport medium  Result Value Ref Range Status   SARS Coronavirus 2 by RT PCR NEGATIVE NEGATIVE Final    Comment: (NOTE) SARS-CoV-2 target nucleic acids are NOT DETECTED.  The SARS-CoV-2 RNA is generally detectable in upper respiratory specimens during the acute phase of infection. The lowest concentration of SARS-CoV-2 viral copies this assay can detect is 138 copies/mL. A negative result does not preclude SARS-Cov-2 infection and should not be used as the sole basis for treatment or other patient management decisions. A negative result may occur with  improper specimen collection/handling, submission of specimen other than nasopharyngeal swab, presence of viral mutation(s) within the areas targeted by this assay, and inadequate number of viral copies(<138 copies/mL). A negative result must be combined with clinical observations, patient history, and epidemiological information. The expected result is Negative.  Fact Sheet for Patients:  EntrepreneurPulse.com.au  Fact Sheet for Healthcare Providers:   IncredibleEmployment.be  This test is no t yet approved or cleared by the [place] FDA and  has been authorized for detection and/or diagnosis of SARS-CoV-2 by FDA under an Emergency Use Authorization (EUA). This EUA will remain  in effect (meaning this test can be used) for the duration of the COVID-19 declaration under Section 564(b)(1) of the Act, 21 U.S.C.section 360bbb-3(b)(1), unless the authorization is terminated  or revoked sooner.       Influenza A by PCR NEGATIVE NEGATIVE Final  Influenza B by PCR NEGATIVE NEGATIVE Final    Comment: (NOTE) The Xpert Xpress SARS-CoV-2/FLU/RSV plus assay is intended as an aid in the diagnosis of influenza from Nasopharyngeal swab specimens and should not be used as a sole basis for treatment. Nasal washings and aspirates are unacceptable for Xpert Xpress SARS-CoV-2/FLU/RSV testing.  Fact Sheet for Patients: EntrepreneurPulse.com.au  Fact Sheet for Healthcare Providers: IncredibleEmployment.be  This test is not yet approved or cleared by the [place] FDA and has been authorized for detection and/or diagnosis of SARS-CoV-2 by FDA under an Emergency Use Authorization (EUA). This EUA will remain in effect (meaning this test can be used) for the duration of the COVID-19 declaration under Section 564(b)(1) of the Act, 21 U.S.C. section 360bbb-3(b)(1), unless the authorization is terminated or revoked.  Performed at [facility], [address], [place], [place] [address]      Time coordinating discharge: 89mns  SIGNED:   JKathie Dike MD  [facility] 12/01/2020, 8:24 PM   If 7PM-7AM, please contact night-coverage [email or web address]

## 2020-12-01 NOTE — TOC Progression Note (Signed)
Transition of Care [facility]) - Progression Note    Patient Details  Name: Isaac [name] MRN: [id number] Date of Birth: 04-08-1941  [facility]) CM/SW Contact  Natasha Bence, LCSW Phone Number: 12/01/2020, 3:30 PM  Clinical Narrative:    CSW followed up with Debbie to inquire if they are able to take patient. Jackelyn Poling reported that she is unable to take patient until 08/06. TOC to follow.    Expected Discharge Plan: [place] Barriers to Discharge: Continued Medical Work up  Expected Discharge Plan and Services Expected Discharge Plan: [place] arrangements for the past 2 months: Single Family Home                           HH Arranged: RN, PT, Social Work [facility] Agency: [place] Date [place]: 11/28/20 Time [place]: [phone] Representative spoke with at [place]: [place] Interventions    Readmission Risk Interventions No flowsheet data found.

## 2020-12-02 ENCOUNTER — Encounter (HOSPITAL_COMMUNITY): Payer: Self-pay | Admitting: Internal Medicine

## 2020-12-02 NOTE — Progress Notes (Signed)
PROGRESS NOTE  Isaac [name] [id number] DOB: 11-Oct-1940 DOA: 11/26/2020 PCP: Dettinger, Fransisca Kaufmann, MD  Brief History:  80 year old male with a history of dementia, CAD, CKD stage III, diastolic CHF, hypertension, hyperlipidemia , and pulmonary fibrosis presenting with 78-monthhistory of transient episodes of dizziness, gait instability, and slurred speech.  The patient is a poor historian secondary to his dementia.  History is obtained from review of the medical record and speaking with the patient spouse at the bedside.  Patient's spouse states that these episodes last about 15 to 20 minutes and have been happening almost on a daily basis for the past month.  She states that it usually occurs after he initially gets up to go to the kitchen.  After about 30 to 60 seconds from the time the patient stands, he begins to have episodes of dizziness and gait instability to the point he has had to hold onto the wall.  She describes this partly as as his legs moving funny.  She also states that he has what she describes as slurred speech during these episodes.  During these episodes, the patient denies any headache, visual disturbance, chest pain, shortness of breath.  There is no syncope.  He usually returns back to his usual baseline without any focal deficits after about 20 minutes.  She states that he has been started on some new medication about 1 month ago, but she does not recall the medicine. Over the past 2 months, the spouse states that the patient has had continued functional and cognitive decline.  He has not had any fevers, chills, chest pain, nausea, vomiting, diarrhea, abdominal pain, dysuria, hematuria.  Assessment/Plan: Gait instability/slurred speech -Suspect that orthostatic hypotension/transient hypotension is causing these episodes -MRI brain without any acute findings of stroke -EEG without any signs of seizures -Serum B12, low, replace -Folic acid -TSH 10000000-PT evaluation  currently recommending home health with supervision, will assess to reassess since it is only patient and his wife currently at home -UA negative for pyuria -Seen by neurology who thought symptoms may be related to progression of dementia/sundowning -Overall agitation improved with Seroquel nightly  Essential hypertension -Blood pressures have been trending up -Restarted metoprolol and amlodipine  CKD stage IIIb -Baseline creatinine 1.5-1.7 -Monitor  Dementia with behavioral disturbance -Continue Namenda -Started on Seroquel to help treat sundowning and agitation -Seems to be excessively sleepy today, will reduce p.m. Seroquel dose  Thrombocytopenia -This appears to be chronic and intermittent dating back to 2018 -Work-up as above -Coags are normal  Hyperlipidemia -Continue statin  Pulmonary fibrosis -Patient follows with pulmonary, Dr. MVaughan Browner-pt will have to bring in Pirfenidone  Disposition -Patient's wife reports that it is only her and the patient who lives in their home -Reassessed by PT with recommendations for skilled nursing facility placement -Patient is stable to discharge whenever SNF bed is available, discharge summary done on 8/6     Status is: Inpatient    Dispo: The patient is from: Home              Anticipated d/c is to: SNF              Patient currently is medically stable to d/c.   Difficult to place patient No        Family Communication:   spouse updated at bedside 8/7  Consultants:  neurology  Code Status:  DNR  DVT Prophylaxis:  [place] Lovenox   Procedures:  As Listed in Progress Note Above  Antibiotics: None   Total time spent 35 minutes.  Greater than 50% spent face to face counseling and coordinating care.     Subjective: Overall p.o. intake has improved.  He has been mildly agitated today, but has not required any medications or restraints.  Objective: Vitals:   12/02/20 0556 12/02/20 0930 12/02/20 1454 12/02/20  1551  BP: 139/70 117/83 (!) 182/86 (!) 170/79  Pulse: 77 90 78 79  Resp: 20     Temp: 99.4 F (37.4 C)  98.5 F (36.9 C)   TempSrc: Oral  Oral   SpO2: 98% 99% 98%   Weight:      Height:        Intake/Output Summary (Last 24 hours) at 12/02/2020 2008 Last data filed at 12/02/2020 1300 Gross per 24 hour  Intake 1360 ml  Output 800 ml  Net 560 ml   Weight change:  Exam:  General exam: Alert, awake, appears calm Respiratory system: Clear to auscultation. Respiratory effort normal. Cardiovascular system:RRR. No murmurs, rubs, gallops.   Data Reviewed: I have personally reviewed following labs and imaging studies Basic Metabolic Panel: Recent Labs  Lab 11/26/20 1743 11/27/20 0346 11/30/20 0555  NA 137 139 140  K 4.1 3.7 3.8  CL 106 108 108  CO2 '25 25 23  '$ GLUCOSE 97 139* 85  BUN 26* 24* 22  CREATININE 1.69* 1.50* 1.49*  CALCIUM 8.7* 9.0 8.9  MG  --  1.9  --   PHOS  --  2.6  --    Liver Function Tests: Recent Labs  Lab 11/26/20 1743 11/27/20 0346  AST 15 16  ALT 10 10  ALKPHOS 67 67  BILITOT 0.5 0.6  PROT 7.3 7.2  ALBUMIN 3.9 3.7   No results for input(s): LIPASE, AMYLASE in the last 168 hours. No results for input(s): AMMONIA in the last 168 hours. Coagulation Profile: Recent Labs  Lab 11/27/20 0346  INR 1.1   CBC: Recent Labs  Lab 11/26/20 1743 11/27/20 0346 11/30/20 0555  WBC 7.7 8.6 9.6  NEUTROABS 4.9  --   --   HGB 12.9* 12.9* 13.5  HCT 37.9* 38.7* 39.9  MCV 97.9 98.2 96.6  PLT 147* 144* 154   Cardiac Enzymes: Recent Labs  Lab 11/27/20 0346  CKTOTAL 38*   BNP: Invalid input(s): POCBNP CBG: No results for input(s): GLUCAP in the last 168 hours. HbA1C: No results for input(s): HGBA1C in the last 72 hours.  Urine analysis:    Component Value Date/Time   COLORURINE YELLOW 11/26/2020 2318   APPEARANCEUR CLEAR 11/26/2020 2318   APPEARANCEUR Clear 04/11/2020 1035   LABSPEC 1.009 11/26/2020 2318   PHURINE 6.0 11/26/2020 2318    GLUCOSEU NEGATIVE 11/26/2020 2318   GLUCOSEU NEGATIVE 09/06/2012 0925   HGBUR NEGATIVE 11/26/2020 2318   BILIRUBINUR NEGATIVE 11/26/2020 2318   BILIRUBINUR Negative 04/11/2020 1035   KETONESUR NEGATIVE 11/26/2020 2318   PROTEINUR NEGATIVE 11/26/2020 2318   UROBILINOGEN 0.2 09/06/2012 0925   NITRITE NEGATIVE 11/26/2020 2318   LEUKOCYTESUR NEGATIVE 11/26/2020 2318   Sepsis Labs: '@LABRCNTIP'$ (procalcitonin:4,lacticidven:4) ) Recent Results (from the past 240 hour(s))  Resp Panel by RT-PCR (Flu A&B, Covid) Nasopharyngeal Swab     Status: None   Collection Time: 11/26/20  5:43 PM   Specimen: Nasopharyngeal Swab; Nasopharyngeal(NP) swabs in vial transport medium  Result Value Ref Range Status   SARS Coronavirus 2 by RT PCR NEGATIVE NEGATIVE Final    Comment: (NOTE) SARS-CoV-2 target nucleic acids  are NOT DETECTED.  The SARS-CoV-2 RNA is generally detectable in upper respiratory specimens during the acute phase of infection. The lowest concentration of SARS-CoV-2 viral copies this assay can detect is 138 copies/mL. A negative result does not preclude SARS-Cov-2 infection and should not be used as the sole basis for treatment or other patient management decisions. A negative result may occur with  improper specimen collection/handling, submission of specimen other than nasopharyngeal swab, presence of viral mutation(s) within the areas targeted by this assay, and inadequate number of viral copies(<138 copies/mL). A negative result must be combined with clinical observations, patient history, and epidemiological information. The expected result is Negative.  Fact Sheet for Patients:  EntrepreneurPulse.com.au  Fact Sheet for Healthcare Providers:  IncredibleEmployment.be  This test is no t yet approved or cleared by the [place] FDA and  has been authorized for detection and/or diagnosis of SARS-CoV-2 by FDA under an Emergency Use Authorization  (EUA). This EUA will remain  in effect (meaning this test can be used) for the duration of the COVID-19 declaration under Section 564(b)(1) of the Act, 21 U.S.C.section 360bbb-3(b)(1), unless the authorization is terminated  or revoked sooner.       Influenza A by PCR NEGATIVE NEGATIVE Final   Influenza B by PCR NEGATIVE NEGATIVE Final    Comment: (NOTE) The Xpert Xpress SARS-CoV-2/FLU/RSV plus assay is intended as an aid in the diagnosis of influenza from Nasopharyngeal swab specimens and should not be used as a sole basis for treatment. Nasal washings and aspirates are unacceptable for Xpert Xpress SARS-CoV-2/FLU/RSV testing.  Fact Sheet for Patients: EntrepreneurPulse.com.au  Fact Sheet for Healthcare Providers: IncredibleEmployment.be  This test is not yet approved or cleared by the [place] FDA and has been authorized for detection and/or diagnosis of SARS-CoV-2 by FDA under an Emergency Use Authorization (EUA). This EUA will remain in effect (meaning this test can be used) for the duration of the COVID-19 declaration under Section 564(b)(1) of the Act, 21 U.S.C. section 360bbb-3(b)(1), unless the authorization is terminated or revoked.  Performed at [facility], 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 Mediastinal contours  and pulmonary vascularity normal. Emphysematous and bronchitic changes consistent with COPD. Bibasilar atelectasis with nodular density at LEFT lung base 10 mm diameter, not seen on previous exam but noted on 09/11/2016 study, likely nipple shadow. No acute infiltrate, pleural effusion, or pneumothorax. Bones demineralized with chronic compression deformity at the thoracolumbar junction. IMPRESSION: COPD changes with bibasilar atelectasis versus scarring. Emphysema (ICD10-J43.9). Electronically Signed   By: Lavonia Dana M.D.   On: 11/15/2020 10:48   CT Head Wo Contrast  Result Date: 11/26/2020 CLINICAL DATA:  Mental status change, unknown cause Patient reports dizziness with multiple falls. EXAM: CT HEAD WITHOUT CONTRAST TECHNIQUE: Contiguous axial  images were obtained from the base of the skull through the vertex without intravenous contrast. COMPARISON:  Head CT 02/18/2020 FINDINGS: Brain: No evidence of acute infarction, hemorrhage, hydrocephalus, extra-axial collection or mass lesion/mass effect. Stable brain volume and mild periventricular chronic small vessel ischemia. 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 Vascular: Atherosclerosis of skullbase vasculature without hyperdense vessel or abnormal calcification. Skull: No fracture or focal lesion. Sinuses/Orbits: Paranasal sinuses and mastoid air cells are clear. The visualized orbits are unremarkable. Bilateral cataract resection. Other: None. IMPRESSION: 1. No acute intracranial abnormality. 2. Stable chronic small vessel ischemia. 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 Electronically Signed   By: Keith Rake M.D.   On: 11/26/2020 18:25   MR ANGIO HEAD WO CONTRAST  Result Date: 11/27/2020 CLINICAL DATA:  TIA; episodes of weakness in both legs EXAM: MRI HEAD WITHOUT CONTRAST MRA HEAD WITHOUT CONTRAST MRA NECK WITHOUT AND WITH CONTRAST TECHNIQUE: Multiplanar, multi-echo pulse sequences of the brain and surrounding  structures were acquired without intravenous contrast. Angiographic images of the Circle of Willis were acquired using MRA technique without intravenous contrast. Angiographic images of the neck were acquired using MRA technique without and with intravenous contrast. Carotid stenosis measurements (when applicable) are obtained utilizing NASCET criteria, using the distal internal carotid diameter as the denominator. CONTRAST:  48m GADAVIST GADOBUTROL 1 MMOL/ML IV SOLN COMPARISON:  10/19/2019 FINDINGS: MRI HEAD Motion artifact is present. Brain: There is no acute infarction or intracranial hemorrhage. There is no intracranial mass, mass effect, or edema. There is no hydrocephalus or extra-axial fluid collection. Patchy T2 hyperintensity in the supratentorial white matter is nonspecific but probably reflects mild to moderate chronic microvascular ischemic changes. Prominence of the ventricles and sulci reflects minor generalized parenchymal volume loss. 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 Multiple small chronic cerebellar infarcts. Vascular: Major vessel flow voids at the skull base are preserved. Skull and upper cervical spine: Normal marrow signal is preserved. Sinuses/Orbits: Minor mucosal thickening. Bilateral lens replacements. Other: Sella is unremarkable.  Mastoid air cells are clear. MRA HEAD Intracranial internal carotid arteries are patent. Middle and anterior cerebral arteries are patent. Intracranial vertebral arteries, basilar artery, posterior cerebral arteries are patent. There is no significant stenosis or aneurysm. MRA NECK Great vessel origins are patent. Common, internal, and external carotid arteries are patent. Extracranial codominant vertebral arteries are patent. There is no hemodynamically significant stenosis. Atherosclerotic irregularity at the left greater than right ICA origins. IMPRESSION: No acute infarction,  hemorrhage, or mass. 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 No large vessel occlusion, hemodynamically significant stenosis, or evidence of dissection. Electronically Signed   By: PMacy MisM.D.   On: 11/27/2020 09:17   MR Angiogram Neck W or Wo Contrast  Result Date: 11/27/2020 CLINICAL DATA:  TIA; episodes of weakness in both legs EXAM: MRI HEAD WITHOUT CONTRAST MRA HEAD WITHOUT CONTRAST MRA NECK WITHOUT AND WITH CONTRAST TECHNIQUE: Multiplanar, multi-echo pulse sequences of the brain and surrounding structures were acquired without intravenous contrast. Angiographic images of the Circle of Willis were acquired using MRA technique without intravenous contrast. Angiographic images of the neck were acquired using MRA technique without and with intravenous contrast. Carotid stenosis measurements (when applicable) are obtained utilizing NASCET criteria, using the distal internal carotid diameter as the denominator. CONTRAST:  756mGADAVIST GADOBUTROL 1 MMOL/ML IV SOLN COMPARISON:  10/19/2019 FINDINGS: MRI HEAD Motion artifact is present. Brain: There is no acute infarction  or intracranial hemorrhage. There is no intracranial mass, mass effect, or edema. There is no hydrocephalus or extra-axial fluid collection. Patchy T2 hyperintensity in the supratentorial white matter is nonspecific but probably reflects mild to moderate chronic microvascular ischemic changes. Prominence of the ventricles and sulci reflects minor generalized parenchymal volume loss. 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 Multiple small chronic cerebellar infarcts. Vascular: Major vessel flow voids at the skull base are preserved. Skull and upper cervical spine: Normal marrow signal is preserved. Sinuses/Orbits: Minor mucosal thickening. Bilateral lens replacements. Other: Sella is unremarkable.  Mastoid air cells are clear. MRA HEAD Intracranial  internal carotid arteries are patent. Middle and anterior cerebral arteries are patent. Intracranial vertebral arteries, basilar artery, posterior cerebral arteries are patent. There is no significant stenosis or aneurysm. MRA NECK Great vessel origins are patent. Common, internal, and external carotid arteries are patent. Extracranial codominant vertebral arteries are patent. There is no hemodynamically significant stenosis. Atherosclerotic irregularity at the left greater than right ICA origins. IMPRESSION: No acute infarction, hemorrhage, or mass. 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 No large vessel occlusion, hemodynamically significant stenosis, or evidence of dissection. Electronically Signed   By: Macy Mis M.D.   On: 11/27/2020 09:17   MR BRAIN WO CONTRAST  Result Date: 11/27/2020 CLINICAL DATA:  TIA; episodes of weakness in both legs EXAM: MRI HEAD WITHOUT CONTRAST MRA HEAD WITHOUT CONTRAST MRA NECK WITHOUT AND WITH CONTRAST TECHNIQUE: Multiplanar, multi-echo pulse sequences of the brain and surrounding structures were acquired without intravenous contrast. Angiographic images of the Circle of Willis were acquired using MRA technique without intravenous contrast. Angiographic images of the neck were acquired using MRA technique without and with intravenous contrast. Carotid stenosis measurements (when applicable) are obtained utilizing NASCET criteria, using the distal internal carotid diameter as the denominator. CONTRAST:  75m GADAVIST GADOBUTROL 1 MMOL/ML IV SOLN COMPARISON:  10/19/2019 FINDINGS: MRI HEAD Motion artifact is present. Brain: There is no acute infarction or intracranial hemorrhage. There is no intracranial mass, mass effect, or edema. There is no hydrocephalus or extra-axial fluid collection. Patchy T2 hyperintensity in the supratentorial white matter is nonspecific but probably reflects mild to moderate chronic microvascular ischemic changes. Prominence of  the ventricles and sulci reflects minor generalized parenchymal volume loss. 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 Multiple small chronic cerebellar infarcts. Vascular: Major vessel flow voids at the skull base are preserved. Skull and upper cervical spine: Normal marrow signal is preserved. Sinuses/Orbits: Minor mucosal thickening. Bilateral lens replacements. Other: Sella is unremarkable.  Mastoid air cells are clear. MRA HEAD Intracranial internal carotid arteries are patent. Middle and anterior cerebral arteries are patent. Intracranial vertebral arteries, basilar artery, posterior cerebral arteries are patent. There is no significant stenosis or aneurysm. MRA NECK Great vessel origins are patent. Common, internal, and external carotid arteries are patent. Extracranial codominant vertebral arteries are patent. There is no hemodynamically significant stenosis. Atherosclerotic irregularity at the left greater than right ICA origins. IMPRESSION: No acute infarction, hemorrhage, or mass. 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 No large vessel occlusion, hemodynamically significant stenosis, or evidence of dissection. Electronically Signed   By: PMacy MisM.D.   On: 11/27/2020 09:17   DG Chest Portable 1 View  Result Date: 11/26/2020 CLINICAL DATA:  Chest pain, near syncope EXAM: PORTABLE CHEST 1 VIEW COMPARISON:  11/14/2020 FINDINGS:  Rotated AP portable examination with scarring and/or fibrosis at the bilateral lung bases. No acute appearing airspace opacity. Cardiomegaly status post median sternotomy and CABG. IMPRESSION: Rotated AP portable examination with scarring and/or fibrosis at the bilateral lung bases. No acute appearing airspace opacity. Electronically Signed   By: Eddie Candle M.D.   On: 11/26/2020 18:00   EEG adult  Result Date: 11/27/2020 Lora Havens, MD     11/27/2020 12:21 PM Patient Name:  Isaac [name] MRN: [id number] Epilepsy Attending: Lora Havens Referring Physician/Provider: Dr Bernadette Hoit Date: 11/27/2020 Duration: 24.10 mins Patient history: 80yo M with dizziness. EEG to evaluate for seizure Level of alertness: Awake AEDs during EEG study: None Technical aspects: This EEG study was done with scalp electrodes positioned according to the 10-20 International system of electrode placement. Electrical activity was acquired at a sampling rate of '500Hz'$  and reviewed with a high frequency filter of '70Hz'$  and a low frequency filter of '1Hz'$ . EEG data were recorded continuously and digitally stored. Description: The posterior dominant rhythm consists of '6Hz'$  activity of moderate voltage (25-35 uV) seen predominantly in posterior head regions, symmetric and reactive to eye opening and eye closing. Hyperventilation and photic stimulation were not performed.   ABNORMALITY - Background slow IMPRESSION: This study is suggestive of mild diffuse encephalopathy, nonspecific etiology. No seizures or epileptiform discharges were seen throughout the recording. Lora Havens   ECHOCARDIOGRAM COMPLETE  Result Date: 11/27/2020    ECHOCARDIOGRAM REPORT   Patient Name:   Isaac [name] Date of Exam: 11/27/2020 Medical Rec #:  [id number]     Height:       70.5 in Accession #:    [id number]    Weight:       148.0 lb Date of Birth:  April 01, 1941     BSA:          1.846 m Patient Age:    80 years      BP:           146/86 mmHg Patient Gender: M             HR:           67 bpm. Exam Location:  [facility] Procedure: 2D Echo, Cardiac Doppler and Color Doppler Indications:    Stroke  History:        Patient has prior history of Echocardiogram examinations, most                 recent 09/08/2019. CHF, CAD, Prior CABG, Stroke,                 Signs/Symptoms:Dyspnea and Chest Pain; Risk Factors:Hypertension                 and Dyslipidemia.  Sonographer:    Wenda Low Referring Phys: [id number] OLADAPO ADEFESO IMPRESSIONS  1.  Left ventricular ejection fraction, by estimation, is 55 to 60%. The left ventricle has normal function. The left ventricle demonstrates regional wall motion abnormalities (see scoring diagram/findings for description). There is mild left ventricular  hypertrophy. Left ventricular diastolic parameters are consistent with Grade I diastolic dysfunction (impaired relaxation).  2. Right ventricular systolic function is normal. The right ventricular size is normal. There is normal pulmonary artery systolic pressure. The estimated right ventricular systolic pressure is 123456 mmHg.  3. Left atrial size was moderately dilated.  4. The mitral valve is degenerative. Moderate mitral valve regurgitation.  5. The aortic valve is tricuspid. Aortic valve regurgitation is mild  to moderate. Aortic regurgitation PHT measures 552 msec. Aortic valve mean gradient measures 2.0 mmHg.  6. The inferior vena cava is normal in size with greater than 50% respiratory variability, suggesting right atrial pressure of 3 mmHg. Comparison(s): Prior images reviewed side by side. Inferoposterior wall motion abnormalitiy present on prior study. Mitral regurgitation is moderate at this point. FINDINGS  Left Ventricle: Left ventricular ejection fraction, by estimation, is 55 to 60%. The left ventricle has normal function. The left ventricle demonstrates regional wall motion abnormalities. The left ventricular internal cavity size was normal in size. There is mild left ventricular hypertrophy. Left ventricular diastolic parameters are consistent with Grade I diastolic dysfunction (impaired relaxation).  LV Wall Scoring: The basal inferolateral segment, basal anterolateral segment, and basal inferior segment are akinetic. The entire anterior wall, mid and distal lateral wall, entire septum, entire apex, mid and distal inferior wall, and mid anterolateral segment are normal. Right Ventricle: The right ventricular size is normal. No increase in right  ventricular wall thickness. Right ventricular systolic function is normal. There is normal pulmonary artery systolic pressure. The tricuspid regurgitant velocity is 2.32 m/s, and  with an assumed right atrial pressure of 3 mmHg, the estimated right ventricular systolic pressure is 123456 mmHg. Left Atrium: Left atrial size was moderately dilated. Right Atrium: Right atrial size was normal in size. Pericardium: There is no evidence of pericardial effusion. Mitral Valve: The mitral valve is degenerative in appearance. There is mild thickening of the mitral valve leaflet(s). Mild mitral annular calcification. Moderate mitral valve regurgitation, with eccentric posteriorly directed jet. MV peak gradient, 3.0 mmHg. The mean mitral valve gradient is 1.0 mmHg. Tricuspid Valve: The tricuspid valve is grossly normal. Tricuspid valve regurgitation is trivial. Aortic Valve: The aortic valve is tricuspid. There is mild aortic valve annular calcification. Aortic valve regurgitation is mild to moderate. Aortic regurgitation PHT measures 552 msec. Aortic valve mean gradient measures 2.0 mmHg. Aortic valve peak gradient measures 4.4 mmHg. Aortic valve area, by VTI measures 2.92 cm. Pulmonic Valve: The pulmonic valve was grossly normal. Pulmonic valve regurgitation is trivial. Aorta: The aortic root is normal in size and structure. Venous: The inferior vena cava is normal in size with greater than 50% respiratory variability, suggesting right atrial pressure of 3 mmHg. IAS/Shunts: No atrial level shunt detected by color flow Doppler.  LEFT VENTRICLE PLAX 2D LVIDd:         5.18 cm      Diastology LVIDs:         2.86 cm      LV e' medial:    4.64 cm/s LV PW:         1.35 cm      LV E/e' medial:  9.5 LV IVS:        1.09 cm      LV e' lateral:   5.78 cm/s LVOT diam:     2.00 cm      LV E/e' lateral: 7.6 LV SV:         76 LV SV Index:   41 LVOT Area:     3.14 cm  LV Volumes (MOD) LV vol d, MOD A2C: 59.4 ml LV vol d, MOD A4C: 114.0 ml LV  vol s, MOD A2C: 41.7 ml LV vol s, MOD A4C: 50.4 ml LV SV MOD A2C:     17.7 ml LV SV MOD A4C:     114.0 ml LV SV MOD BP:      37.1 ml RIGHT VENTRICLE RV  Basal diam:  3.39 cm RV Mid diam:    2.85 cm RV S prime:     12.10 cm/s TAPSE (M-mode): 2.2 cm LEFT ATRIUM             Index       RIGHT ATRIUM           Index LA diam:        4.70 cm 2.55 cm/m  RA Area:     19.40 cm LA Vol (A2C):   69.6 ml 37.70 ml/m RA Volume:   48.80 ml  26.43 ml/m LA Vol (A4C):   80.2 ml 43.44 ml/m LA Biplane Vol: 75.7 ml 41.00 ml/m  AORTIC VALVE AV Area (Vmax):    2.62 cm AV Area (Vmean):   2.53 cm AV Area (VTI):     2.92 cm AV Vmax:           105.00 cm/s AV Vmean:          72.100 cm/s AV VTI:            0.259 m AV Peak Grad:      4.4 mmHg AV Mean Grad:      2.0 mmHg LVOT Vmax:         87.70 cm/s LVOT Vmean:        58.000 cm/s LVOT VTI:          0.241 m LVOT/AV VTI ratio: 0.93 AI PHT:            552 msec  AORTA Ao Root diam: 3.20 cm Ao Asc diam:  3.50 cm MITRAL VALVE               TRICUSPID VALVE MV Area (PHT): 3.15 cm    TR Peak grad:   21.5 mmHg MV Area VTI:   4.32 cm    TR Vmax:        232.00 cm/s MV Peak grad:  3.0 mmHg MV Mean grad:  1.0 mmHg    SHUNTS MV Vmax:       0.86 m/s    Systemic VTI:  0.24 m MV Vmean:      39.6 cm/s   Systemic Diam: 2.00 cm MV Decel Time: 241 msec MV E velocity: 44.20 cm/s MV A velocity: 62.70 cm/s MV E/A ratio:  0.70 Rozann Lesches MD Electronically signed by Rozann Lesches MD Signature Date/Time: 11/27/2020/11:28:33 AM    Final     Kathie Dike, MD  [facility]  If 7PM-7AM, please contact night-coverage [email or web address]  12/02/2020, 8:08 PM   LOS: 3 days

## 2020-12-02 NOTE — Plan of Care (Signed)
  Problem: Education: Goal: Knowledge of General Education information will improve Description Including pain rating scale, medication(s)/side effects and non-pharmacologic comfort measures Outcome: Progressing   Problem: Health Behavior/Discharge Planning: Goal: Ability to manage health-related needs will improve Outcome: Progressing   

## 2020-12-02 NOTE — TOC Progression Note (Signed)
Transition of Care [facility]) - Progression Note    Patient Details  Name: Isaac [name] MRN: [id number] Date of Birth: 10/03/1940  Transition of Care [facility]) CM/SW Contact  Natasha Bence, LCSW Phone Number: 12/02/2020, 4:46 PM  Clinical Narrative:    CSW was able to get in contact with Debbie from [facility]. Debbie reported that patient's room was not yet ready for SNF admission TOC to follow.    Expected Discharge Plan: [place] Barriers to Discharge: Continued Medical Work up  Expected Discharge Plan and Services Expected Discharge Plan: [place] arrangements for the past 2 months: Single Family Home                           HH Arranged: RN, PT, Social Work [facility] Agency: [place] Date [place]: 11/28/20 Time [place]: [phone] Representative spoke with at [place]: [place] ([place]) Interventions    Readmission Risk Interventions No flowsheet data found.

## 2020-12-02 NOTE — Progress Notes (Signed)
Gave inhaler to patient, doubtful if he received any, Patient is confused.

## 2020-12-03 NOTE — Progress Notes (Signed)
Report called to receiving nurse at [facility]. Receiving nurse had no further questions or concern after report was given. Awaiting for EMS to transport patient.

## 2020-12-03 NOTE — Progress Notes (Signed)
OT Cancellation Note  Patient Details Name: Isaac [name] MRN: [id number] DOB: 04-28-1941   Cancelled Treatment:    Reason Eval/Treat Not Completed: Fatigue/lethargy limiting ability to participate. Pt sleeping upon therapist's arrival. Pt did not wake to name or sternum rub. Will attempt to evaluate pt later as time permits.   Nabeeha Badertscher OT, MOT   Larey Seat 12/03/2020, 9:26 AM

## 2020-12-03 NOTE — Care Management Important Message (Signed)
Important Message  Patient Details  Name: Isaac [name] MRN: [id number] Date of Birth: Apr 21, 1941   Medicare Important Message Given:  Yes     [name] 12/03/2020, 12:42 PM

## 2020-12-03 NOTE — Discharge Summary (Signed)
Physician Discharge Summary  Isaac [name] [id number] DOB: March 09, 1941 DOA: 11/26/2020  PCP: Dettinger, Fransisca Kaufmann, MD  Admit date: 11/26/2020 Discharge date: 12/02/2020  Admitted From: Home Disposition: Skilled nursing facility  Recommendations for Outpatient Follow-up:  Follow up with PCP in 1-2 weeks Please obtain BMP/CBC in one week   Discharge Condition: Stable CODE STATUS: DNR Diet recommendation: Heart healthy  Brief/Interim Summary: 80 year old male with a history of dementia, CAD, CKD stage III, diastolic CHF, hypertension, hyperlipidemia , and pulmonary fibrosis presenting with 73-monthhistory of transient episodes of dizziness, gait instability, and slurred speech.  The patient is a poor historian secondary to his dementia.  History is obtained from review of the medical record and speaking with the patient spouse at the bedside.  Patient's spouse states that these episodes last about 15 to 20 minutes and have been happening almost on a daily basis for the past month.  She states that it usually occurs after he initially gets up to go to the kitchen.  After about 30 to 60 seconds from the time the patient stands, he begins to have episodes of dizziness and gait instability to the point he has had to hold onto the wall.  She describes this partly as as his legs moving funny.  She also states that he has what she describes as slurred speech during these episodes.  During these episodes, the patient denies any headache, visual disturbance, chest pain, shortness of breath.  There is no syncope.  He usually returns back to his usual baseline without any focal deficits after about 20 minutes.  She states that he has been started on some new medication about 1 month ago, but she does not recall the medicine. Over the past 2 months, the spouse states that the patient has had continued functional and cognitive decline.  He has not had any fevers, chills, chest pain, nausea, vomiting, diarrhea,  abdominal pain, dysuria, hematuria  Discharge Diagnoses:  Principal Problem:   Transient ischemic attack Active Problems:   Hyperlipidemia LDL goal <70   GERD (gastroesophageal reflux disease)   Essential hypertension   Thrombocytopenia (HCC)   Orthostatic hypotension   CKD (chronic kidney disease) stage 3, GFR 30-59 ml/min (HCC)   Delirium  Gait instability/slurred speech -Suspect that orthostatic hypotension/transient hypotension is causing these episodes -MRI brain without any acute findings of stroke -EEG without any signs of seizures -Serum B12, low at [id number] replace -Folic acid normal -TSH 1.720 -UA negative for pyuria -Seen by neurology who thought symptoms may be related to progression of dementia/sundowning -Patient started on Seroquel nightly which seems to have improved her symptoms   Orthostatic hypotension -Improved with IV fluids   Essential hypertension -Blood pressures have been trending up -Restarted metoprolol and amlodipine   CKD stage IIIb -Baseline creatinine 1.5-1.7 -Monitor   Dementia with behavioral disturbance -Continue Namenda -Started on Seroquel to help treat sundowning and agitation -Overall mental status appears to be more calm and cooperative now   Thrombocytopenia -This appears to be chronic and intermittent dating back to 2018 -Work-up as above -Coags are normal   Hyperlipidemia -Continue statin   Pulmonary fibrosis -Patient follows with pulmonary, Dr. MVaughan Browner-Continue on pirfenidone   Disposition -Patient's wife reports that it is only her and the patient who lives in their home -Reassessed by PT with recommendations for skilled nursing facility placement  Discharge Instructions   Allergies as of 12/03/2020   No Known Allergies      Medication List  TAKE these medications    albuterol 108 (90 Base) MCG/ACT inhaler Commonly known as: VENTOLIN HFA Inhale 2 puffs into the lungs every 6 (six) hours as needed for  wheezing or shortness of breath.   amLODipine 2.5 MG tablet Commonly known as: NORVASC Take 2.5 mg by mouth daily.   aspirin 81 MG EC tablet Take 81 mg by mouth every morning.   docusate sodium 100 MG capsule Commonly known as: COLACE Take 1 capsule by mouth every other day   Esbriet 267 MG Tabs Generic drug: Pirfenidone TAKE 2 TABLETS (534 MG TOTAL) BY MOUTH IN THE MORNING, AT NOON, AND AT BEDTIME.   fluticasone 50 MCG/ACT nasal spray Commonly known as: FLONASE Place 2 sprays into both nostrils daily as needed for allergies or rhinitis.   Fluticasone-Salmeterol 250-50 MCG/DOSE Aepb Commonly known as: Advair Diskus Inhale 1 puff into the lungs 2 (two) times daily.   furosemide 40 MG tablet Commonly known as: LASIX Take 1 tablet (40 mg total) by mouth daily as needed for fluid or edema.   memantine 10 MG tablet Commonly known as: Namenda Take 1 tablet (10 mg total) by mouth 2 (two) times daily. Start after finishing the namenda starter pack first   metoprolol succinate 50 MG 24 hr tablet Commonly known as: TOPROL-XL Take 1 tablet (50 mg total) by mouth daily. What changed:  how much to take how to take this when to take this additional instructions   multivitamin with minerals Tabs tablet Take 1 tablet by mouth daily.   multivitamin-lutein Caps capsule Take 1 capsule by mouth daily.   nitroGLYCERIN 0.4 MG SL tablet Commonly known as: NITROSTAT DISSOLVE ONE TABLET UNDER THE TONGUE EVERY 5 MINUTES AS NEEDED FOR CHEST PAIN.  DO NOT EXCEED A TOTAL OF 3 DOSES IN 15 MINUTES What changed: See the new instructions.   omeprazole 20 MG capsule Commonly known as: PRILOSEC Take 1 capsule (20 mg total) by mouth 2 (two) times daily.   ondansetron 4 MG tablet Commonly known as: ZOFRAN TAKE 1 TABLET BY MOUTH THREE TIMES DAILY AS NEEDED FOR NAUSEA AND VOMITING What changed: See the new instructions.   potassium chloride SA 20 MEQ tablet Commonly known as: Klor-Con  M20 TAKE ONE TABLET BY MOUTH ONCE DAILY WHEN  YOU  TAKE LASIX   QUEtiapine 25 MG tablet Commonly known as: SEROQUEL Take 3 tablets (75 mg total) by mouth at bedtime.   simethicone 125 MG chewable tablet Commonly known as: MYLICON Chew 0000000 mg by mouth every 6 (six) hours as needed for flatulence.   simvastatin 20 MG tablet Commonly known as: ZOCOR TAKE 1 TABLET BY MOUTH AT BEDTIME   vitamin B-12 1000 MCG tablet Commonly known as: CYANOCOBALAMIN Take 1 tablet (1,000 mcg total) by mouth daily.   Vitamin D 50 MCG (2000 UT) tablet Take 2,000 Units by mouth daily.        Follow-up Information     [facility] Follow up.   Why: RN/PT/SW will call within 48 hours to set up your first home visit.               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 Mediastinal contours and pulmonary vascularity normal.  Emphysematous and bronchitic changes consistent with COPD. Bibasilar atelectasis with nodular density at LEFT lung base 10 mm diameter, not seen on previous exam but noted on 09/11/2016 study, likely nipple shadow. No acute infiltrate, pleural effusion, or pneumothorax. Bones demineralized with chronic compression deformity at the thoracolumbar junction. IMPRESSION: COPD changes with bibasilar atelectasis versus scarring. Emphysema (ICD10-J43.9). Electronically Signed   By: Lavonia Dana M.D.   On: 11/15/2020 10:48   CT Head Wo Contrast  Result Date: 11/26/2020 CLINICAL DATA:  Mental status change, unknown cause Patient reports dizziness with multiple falls. EXAM: CT HEAD WITHOUT CONTRAST TECHNIQUE: Contiguous axial images were obtained from the base of the skull through the vertex  without intravenous contrast. COMPARISON:  Head CT 02/18/2020 FINDINGS: Brain: No evidence of acute infarction, hemorrhage, hydrocephalus, extra-axial collection or mass lesion/mass effect. Stable brain volume and mild periventricular chronic small vessel ischemia. 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 Vascular: Atherosclerosis of skullbase vasculature without hyperdense vessel or abnormal calcification. Skull: No fracture or focal lesion. Sinuses/Orbits: Paranasal sinuses and mastoid air cells are clear. The visualized orbits are unremarkable. Bilateral cataract resection. Other: None. IMPRESSION: 1. No acute intracranial abnormality. 2. Stable chronic small vessel ischemia. 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 Electronically Signed   By: Keith Rake M.D.   On: 11/26/2020 18:25   MR ANGIO HEAD WO CONTRAST  Result Date: 11/27/2020 CLINICAL DATA:  TIA; episodes of weakness in both legs EXAM: MRI HEAD WITHOUT CONTRAST MRA HEAD WITHOUT CONTRAST MRA NECK WITHOUT AND WITH CONTRAST TECHNIQUE: Multiplanar, multi-echo pulse sequences of the brain and surrounding structures were acquired without intravenous contrast. Angiographic images of the Circle of Willis were acquired using MRA technique without intravenous contrast. Angiographic images of the neck were acquired using MRA technique without and with intravenous contrast. Carotid stenosis measurements (when applicable) are obtained utilizing NASCET criteria, using the distal internal carotid diameter as the denominator. CONTRAST:  7m GADAVIST GADOBUTROL 1 MMOL/ML IV SOLN COMPARISON:  10/19/2019 FINDINGS: MRI HEAD Motion artifact is present. Brain: There is no acute infarction or intracranial hemorrhage. There is no intracranial mass, mass effect, or edema. There is no hydrocephalus or extra-axial fluid collection. Patchy T2 hyperintensity in the supratentorial white matter is nonspecific but probably reflects mild to moderate  chronic microvascular ischemic changes. Prominence of the ventricles and sulci reflects minor generalized parenchymal volume loss. 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 Multiple small chronic cerebellar infarcts. Vascular: Major vessel flow voids at the skull base are preserved. Skull and upper cervical spine: Normal marrow signal is preserved. Sinuses/Orbits: Minor mucosal thickening. Bilateral lens replacements. Other: Sella is unremarkable.  Mastoid air cells are clear. MRA HEAD Intracranial internal carotid arteries are patent. Middle and anterior cerebral arteries are patent. Intracranial vertebral arteries, basilar artery, posterior cerebral arteries are patent. There is no significant stenosis or aneurysm. MRA NECK Great vessel origins are patent. Common, internal, and external carotid arteries are patent. Extracranial codominant vertebral arteries are patent. There is no hemodynamically significant stenosis. Atherosclerotic irregularity at the left greater than right ICA origins. IMPRESSION: No acute infarction, hemorrhage, or mass. 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 No large vessel occlusion, hemodynamically significant stenosis, or evidence of dissection. Electronically Signed   By: PMacy MisM.D.   On: 11/27/2020 09:17   MR Angiogram Neck W or Wo Contrast  Result Date: 11/27/2020 CLINICAL DATA:  TIA; episodes of weakness in both legs  EXAM: MRI HEAD WITHOUT CONTRAST MRA HEAD WITHOUT CONTRAST MRA NECK WITHOUT AND WITH CONTRAST TECHNIQUE: Multiplanar, multi-echo pulse sequences of the brain and surrounding structures were acquired without intravenous contrast. Angiographic images of the Circle of Willis were acquired using MRA technique without intravenous contrast. Angiographic images of the neck were acquired using MRA technique without and with intravenous contrast. Carotid stenosis measurements  (when applicable) are obtained utilizing NASCET criteria, using the distal internal carotid diameter as the denominator. CONTRAST:  59m GADAVIST GADOBUTROL 1 MMOL/ML IV SOLN COMPARISON:  10/19/2019 FINDINGS: MRI HEAD Motion artifact is present. Brain: There is no acute infarction or intracranial hemorrhage. There is no intracranial mass, mass effect, or edema. There is no hydrocephalus or extra-axial fluid collection. Patchy T2 hyperintensity in the supratentorial white matter is nonspecific but probably reflects mild to moderate chronic microvascular ischemic changes. Prominence of the ventricles and sulci reflects minor generalized parenchymal volume loss. 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 Multiple small chronic cerebellar infarcts. Vascular: Major vessel flow voids at the skull base are preserved. Skull and upper cervical spine: Normal marrow signal is preserved. Sinuses/Orbits: Minor mucosal thickening. Bilateral lens replacements. Other: Sella is unremarkable.  Mastoid air cells are clear. MRA HEAD Intracranial internal carotid arteries are patent. Middle and anterior cerebral arteries are patent. Intracranial vertebral arteries, basilar artery, posterior cerebral arteries are patent. There is no significant stenosis or aneurysm. MRA NECK Great vessel origins are patent. Common, internal, and external carotid arteries are patent. Extracranial codominant vertebral arteries are patent. There is no hemodynamically significant stenosis. Atherosclerotic irregularity at the left greater than right ICA origins. IMPRESSION: No acute infarction, hemorrhage, or mass. 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 No large vessel occlusion, hemodynamically significant stenosis, or evidence of dissection. Electronically Signed   By: PMacy MisM.D.   On: 11/27/2020 09:17   MR BRAIN WO CONTRAST  Result Date:  11/27/2020 CLINICAL DATA:  TIA; episodes of weakness in both legs EXAM: MRI HEAD WITHOUT CONTRAST MRA HEAD WITHOUT CONTRAST MRA NECK WITHOUT AND WITH CONTRAST TECHNIQUE: Multiplanar, multi-echo pulse sequences of the brain and surrounding structures were acquired without intravenous contrast. Angiographic images of the Circle of Willis were acquired using MRA technique without intravenous contrast. Angiographic images of the neck were acquired using MRA technique without and with intravenous contrast. Carotid stenosis measurements (when applicable) are obtained utilizing NASCET criteria, using the distal internal carotid diameter as the denominator. CONTRAST:  771mGADAVIST GADOBUTROL 1 MMOL/ML IV SOLN COMPARISON:  10/19/2019 FINDINGS: MRI HEAD Motion artifact is present. Brain: There is no acute infarction or intracranial hemorrhage. There is no intracranial mass, mass effect, or edema. There is no hydrocephalus or extra-axial fluid collection. Patchy T2 hyperintensity in the supratentorial white matter is nonspecific but probably reflects mild to moderate chronic microvascular ischemic changes. Prominence of the ventricles and sulci reflects minor generalized parenchymal volume loss. 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 Multiple small chronic cerebellar infarcts. Vascular: Major vessel flow voids at the skull base are preserved. Skull and upper cervical spine: Normal marrow signal is preserved. Sinuses/Orbits: Minor mucosal thickening. Bilateral lens replacements. Other: Sella is unremarkable.  Mastoid air cells are clear. MRA HEAD Intracranial internal carotid arteries are patent. Middle and anterior cerebral arteries are patent. Intracranial vertebral arteries, basilar artery, posterior cerebral arteries are patent. There is no significant stenosis  or aneurysm. MRA NECK Great vessel origins are patent. Common, internal, and external  carotid arteries are patent. Extracranial codominant vertebral arteries are patent. There is no hemodynamically significant stenosis. Atherosclerotic irregularity at the left greater than right ICA origins. IMPRESSION: No acute infarction, hemorrhage, or mass. 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 No large vessel occlusion, hemodynamically significant stenosis, or evidence of dissection. Electronically Signed   By: Macy Mis M.D.   On: 11/27/2020 09:17   DG Chest Portable 1 View  Result Date: 11/26/2020 CLINICAL DATA:  Chest pain, near syncope EXAM: PORTABLE CHEST 1 VIEW COMPARISON:  11/14/2020 FINDINGS: Rotated AP portable examination with scarring and/or fibrosis at the bilateral lung bases. No acute appearing airspace opacity. Cardiomegaly status post median sternotomy and CABG. IMPRESSION: Rotated AP portable examination with scarring and/or fibrosis at the bilateral lung bases. No acute appearing airspace opacity. Electronically Signed   By: Eddie Candle M.D.   On: 11/26/2020 18:00   EEG adult  Result Date: 11/27/2020 Lora Havens, MD     11/27/2020 12:21 PM Patient Name: Isaac [name] MRN: [id number] Epilepsy Attending: Lora Havens Referring Physician/Provider: Dr Bernadette Hoit Date: 11/27/2020 Duration: 24.10 mins Patient history: 80yo M with dizziness. EEG to evaluate for seizure Level of alertness: Awake AEDs during EEG study: None Technical aspects: This EEG study was done with scalp electrodes positioned according to the 10-20 International system of electrode placement. Electrical activity was acquired at a sampling rate of '500Hz'$  and reviewed with a high frequency filter of '70Hz'$  and a low frequency filter of '1Hz'$ . EEG data were recorded continuously and digitally stored. Description: The posterior dominant rhythm consists of '6Hz'$  activity of moderate voltage (25-35 uV) seen predominantly in posterior head regions, symmetric and reactive to eye opening and eye  closing. Hyperventilation and photic stimulation were not performed.   ABNORMALITY - Background slow IMPRESSION: This study is suggestive of mild diffuse encephalopathy, nonspecific etiology. No seizures or epileptiform discharges were seen throughout the recording. Lora Havens   ECHOCARDIOGRAM COMPLETE  Result Date: 11/27/2020    ECHOCARDIOGRAM REPORT   Patient Name:   Isaac [name] Date of Exam: 11/27/2020 Medical Rec #:  [id number]     Height:       70.5 in Accession #:    [id number]    Weight:       148.0 lb Date of Birth:  29-Sep-1940     BSA:          1.846 m Patient Age:    76 years      BP:           146/86 mmHg Patient Gender: M             HR:           67 bpm. Exam Location:  [facility] Procedure: 2D Echo, Cardiac Doppler and Color Doppler Indications:    Stroke  History:        Patient has prior history of Echocardiogram examinations, most                 recent 09/08/2019. CHF, CAD, Prior CABG, Stroke,                 Signs/Symptoms:Dyspnea and Chest Pain; Risk Factors:Hypertension                 and Dyslipidemia.  Sonographer:    Wenda Low Referring Phys: [id number] OLADAPO ADEFESO IMPRESSIONS  1. Left ventricular ejection fraction, by  estimation, is 55 to 60%. The left ventricle has normal function. The left ventricle demonstrates regional wall motion abnormalities (see scoring diagram/findings for description). There is mild left ventricular  hypertrophy. Left ventricular diastolic parameters are consistent with Grade I diastolic dysfunction (impaired relaxation).  2. Right ventricular systolic function is normal. The right ventricular size is normal. There is normal pulmonary artery systolic pressure. The estimated right ventricular systolic pressure is 123456 mmHg.  3. Left atrial size was moderately dilated.  4. The mitral valve is degenerative. Moderate mitral valve regurgitation.  5. The aortic valve is tricuspid. Aortic valve regurgitation is mild to moderate. Aortic regurgitation PHT  measures 552 msec. Aortic valve mean gradient measures 2.0 mmHg.  6. The inferior vena cava is normal in size with greater than 50% respiratory variability, suggesting right atrial pressure of 3 mmHg. Comparison(s): Prior images reviewed side by side. Inferoposterior wall motion abnormalitiy present on prior study. Mitral regurgitation is moderate at this point. FINDINGS  Left Ventricle: Left ventricular ejection fraction, by estimation, is 55 to 60%. The left ventricle has normal function. The left ventricle demonstrates regional wall motion abnormalities. The left ventricular internal cavity size was normal in size. There is mild left ventricular hypertrophy. Left ventricular diastolic parameters are consistent with Grade I diastolic dysfunction (impaired relaxation).  LV Wall Scoring: The basal inferolateral segment, basal anterolateral segment, and basal inferior segment are akinetic. The entire anterior wall, mid and distal lateral wall, entire septum, entire apex, mid and distal inferior wall, and mid anterolateral segment are normal. Right Ventricle: The right ventricular size is normal. No increase in right ventricular wall thickness. Right ventricular systolic function is normal. There is normal pulmonary artery systolic pressure. The tricuspid regurgitant velocity is 2.32 m/s, and  with an assumed right atrial pressure of 3 mmHg, the estimated right ventricular systolic pressure is 123456 mmHg. Left Atrium: Left atrial size was moderately dilated. Right Atrium: Right atrial size was normal in size. Pericardium: There is no evidence of pericardial effusion. Mitral Valve: The mitral valve is degenerative in appearance. There is mild thickening of the mitral valve leaflet(s). Mild mitral annular calcification. Moderate mitral valve regurgitation, with eccentric posteriorly directed jet. MV peak gradient, 3.0 mmHg. The mean mitral valve gradient is 1.0 mmHg. Tricuspid Valve: The tricuspid valve is grossly  normal. Tricuspid valve regurgitation is trivial. Aortic Valve: The aortic valve is tricuspid. There is mild aortic valve annular calcification. Aortic valve regurgitation is mild to moderate. Aortic regurgitation PHT measures 552 msec. Aortic valve mean gradient measures 2.0 mmHg. Aortic valve peak gradient measures 4.4 mmHg. Aortic valve area, by VTI measures 2.92 cm. Pulmonic Valve: The pulmonic valve was grossly normal. Pulmonic valve regurgitation is trivial. Aorta: The aortic root is normal in size and structure. Venous: The inferior vena cava is normal in size with greater than 50% respiratory variability, suggesting right atrial pressure of 3 mmHg. IAS/Shunts: No atrial level shunt detected by color flow Doppler.  LEFT VENTRICLE PLAX 2D LVIDd:         5.18 cm      Diastology LVIDs:         2.86 cm      LV e' medial:    4.64 cm/s LV PW:         1.35 cm      LV E/e' medial:  9.5 LV IVS:        1.09 cm      LV e' lateral:   5.78 cm/s LVOT diam:  2.00 cm      LV E/e' lateral: 7.6 LV SV:         76 LV SV Index:   41 LVOT Area:     3.14 cm  LV Volumes (MOD) LV vol d, MOD A2C: 59.4 ml LV vol d, MOD A4C: 114.0 ml LV vol s, MOD A2C: 41.7 ml LV vol s, MOD A4C: 50.4 ml LV SV MOD A2C:     17.7 ml LV SV MOD A4C:     114.0 ml LV SV MOD BP:      37.1 ml RIGHT VENTRICLE RV Basal diam:  3.39 cm RV Mid diam:    2.85 cm RV S prime:     12.10 cm/s TAPSE (M-mode): 2.2 cm LEFT ATRIUM             Index       RIGHT ATRIUM           Index LA diam:        4.70 cm 2.55 cm/m  RA Area:     19.40 cm LA Vol (A2C):   69.6 ml 37.70 ml/m RA Volume:   48.80 ml  26.43 ml/m LA Vol (A4C):   80.2 ml 43.44 ml/m LA Biplane Vol: 75.7 ml 41.00 ml/m  AORTIC VALVE AV Area (Vmax):    2.62 cm AV Area (Vmean):   2.53 cm AV Area (VTI):     2.92 cm AV Vmax:           105.00 cm/s AV Vmean:          72.100 cm/s AV VTI:            0.259 m AV Peak Grad:      4.4 mmHg AV Mean Grad:      2.0 mmHg LVOT Vmax:         87.70 cm/s LVOT Vmean:         58.000 cm/s LVOT VTI:          0.241 m LVOT/AV VTI ratio: 0.93 AI PHT:            552 msec  AORTA Ao Root diam: 3.20 cm Ao Asc diam:  3.50 cm MITRAL VALVE               TRICUSPID VALVE MV Area (PHT): 3.15 cm    TR Peak grad:   21.5 mmHg MV Area VTI:   4.32 cm    TR Vmax:        232.00 cm/s MV Peak grad:  3.0 mmHg MV Mean grad:  1.0 mmHg    SHUNTS MV Vmax:       0.86 m/s    Systemic VTI:  0.24 m MV Vmean:      39.6 cm/s   Systemic Diam: 2.00 cm MV Decel Time: 241 msec MV E velocity: 44.20 cm/s MV A velocity: 62.70 cm/s MV E/A ratio:  0.70 Rozann Lesches MD Electronically signed by Rozann Lesches MD Signature Date/Time: 11/27/2020/11:28:33 AM    Final       Subjective: Family reports that patient is more awake today and was able to eat breakfast  Discharge Exam: Vitals:   12/02/20 2010 12/02/20 2113 12/03/20 0507 12/03/20 0958  BP:  (!) 96/49 127/71 135/72  Pulse:  75 71 69  Resp:  18 18   Temp:  98.2 F (36.8 C) 98.1 F (36.7 C)   TempSrc:      SpO2: 98% 97% 94%   Weight:  Height:        General: Pt is alert, awake, not in acute distress Cardiovascular: RRR, S1/S2 +, no rubs, no gallops Respiratory: CTA bilaterally, no wheezing, no rhonchi Abdominal: Soft, NT, ND, bowel sounds + Extremities: no edema, no cyanosis    The results of significant diagnostics from this hospitalization (including imaging, microbiology, ancillary and laboratory) are listed below for reference.     Microbiology: Recent Results (from the past 240 hour(s))  Resp Panel by RT-PCR (Flu A&B, Covid) Nasopharyngeal Swab     Status: None   Collection Time: 11/26/20  5:43 PM   Specimen: Nasopharyngeal Swab; Nasopharyngeal(NP) swabs in vial transport medium  Result Value Ref Range Status   SARS Coronavirus 2 by RT PCR NEGATIVE NEGATIVE Final    Comment: (NOTE) SARS-CoV-2 target nucleic acids are NOT DETECTED.  The SARS-CoV-2 RNA is generally detectable in upper respiratory specimens during the acute  phase of infection. The lowest concentration of SARS-CoV-2 viral copies this assay can detect is 138 copies/mL. A negative result does not preclude SARS-Cov-2 infection and should not be used as the sole basis for treatment or other patient management decisions. A negative result may occur with  improper specimen collection/handling, submission of specimen other than nasopharyngeal swab, presence of viral mutation(s) within the areas targeted by this assay, and inadequate number of viral copies(<138 copies/mL). A negative result must be combined with clinical observations, patient history, and epidemiological information. The expected result is Negative.  Fact Sheet for Patients:  EntrepreneurPulse.com.au  Fact Sheet for Healthcare Providers:  IncredibleEmployment.be  This test is no t yet approved or cleared by the [place] FDA and  has been authorized for detection and/or diagnosis of SARS-CoV-2 by FDA under an Emergency Use Authorization (EUA). This EUA will remain  in effect (meaning this test can be used) for the duration of the COVID-19 declaration under Section 564(b)(1) of the Act, 21 U.S.C.section 360bbb-3(b)(1), unless the authorization is terminated  or revoked sooner.       Influenza A by PCR NEGATIVE NEGATIVE Final   Influenza B by PCR NEGATIVE NEGATIVE Final    Comment: (NOTE) The Xpert Xpress SARS-CoV-2/FLU/RSV plus assay is intended as an aid in the diagnosis of influenza from Nasopharyngeal swab specimens and should not be used as a sole basis for treatment. Nasal washings and aspirates are unacceptable for Xpert Xpress SARS-CoV-2/FLU/RSV testing.  Fact Sheet for Patients: EntrepreneurPulse.com.au  Fact Sheet for Healthcare Providers: IncredibleEmployment.be  This test is not yet approved or cleared by the [place] FDA and has been authorized for detection and/or diagnosis of  SARS-CoV-2 by FDA under an Emergency Use Authorization (EUA). This EUA will remain in effect (meaning this test can be used) for the duration of the COVID-19 declaration under Section 564(b)(1) of the Act, 21 U.S.C. section 360bbb-3(b)(1), unless the authorization is terminated or revoked.  Performed at [facility], [address] [place], [place] [address]      Labs: BNP (last 3 results) Recent Labs    09/13/20 1443  BNP 161.6*    Basic Metabolic Panel: Recent Labs  Lab 11/26/20 1743 11/27/20 0346 11/30/20 0555  NA 137 139 140  K 4.1 3.7 3.8  CL 106 108 108  CO2 '25 25 23  '$ GLUCOSE 97 139* 85  BUN 26* 24* 22  CREATININE 1.69* 1.50* 1.49*  CALCIUM 8.7* 9.0 8.9  MG  --  1.9  --   PHOS  --  2.6  --     Liver  Function Tests: Recent Labs  Lab 11/26/20 1743 11/27/20 0346  AST 15 16  ALT 10 10  ALKPHOS 67 67  BILITOT 0.5 0.6  PROT 7.3 7.2  ALBUMIN 3.9 3.7    No results for input(s): LIPASE, AMYLASE in the last 168 hours. No results for input(s): AMMONIA in the last 168 hours. CBC: Recent Labs  Lab 11/26/20 1743 11/27/20 0346 11/30/20 0555  WBC 7.7 8.6 9.6  NEUTROABS 4.9  --   --   HGB 12.9* 12.9* 13.5  HCT 37.9* 38.7* 39.9  MCV 97.9 98.2 96.6  PLT 147* 144* 154    Cardiac Enzymes: Recent Labs  Lab 11/27/20 0346  CKTOTAL 38*    BNP: Invalid input(s): POCBNP CBG: No results for input(s): GLUCAP in the last 168 hours. D-Dimer No results for input(s): DDIMER in the last 72 hours. Hgb A1c No results for input(s): HGBA1C in the last 72 hours. Lipid Profile No results for input(s): CHOL, HDL, LDLCALC, TRIG, CHOLHDL, LDLDIRECT in the last 72 hours. Thyroid function studies No results for input(s): TSH, T4TOTAL, T3FREE, THYROIDAB in the last 72 hours.  Invalid input(s): FREET3 Anemia work up No results for input(s): VITAMINB12, FOLATE, FERRITIN, TIBC, IRON, RETICCTPCT in the last 72 hours. Urinalysis    Component Value Date/Time   COLORURINE  YELLOW 11/26/2020 2318   APPEARANCEUR CLEAR 11/26/2020 2318   APPEARANCEUR Clear 04/11/2020 1035   LABSPEC 1.009 11/26/2020 2318   PHURINE 6.0 11/26/2020 2318   GLUCOSEU NEGATIVE 11/26/2020 2318   GLUCOSEU NEGATIVE 09/06/2012 0925   HGBUR NEGATIVE 11/26/2020 2318   BILIRUBINUR NEGATIVE 11/26/2020 2318   BILIRUBINUR Negative 04/11/2020 1035   KETONESUR NEGATIVE 11/26/2020 2318   PROTEINUR NEGATIVE 11/26/2020 2318   UROBILINOGEN 0.2 09/06/2012 0925   NITRITE NEGATIVE 11/26/2020 2318   LEUKOCYTESUR NEGATIVE 11/26/2020 2318   Sepsis Labs Invalid input(s): PROCALCITONIN,  WBC,  LACTICIDVEN Microbiology Recent Results (from the past 240 hour(s))  Resp Panel by RT-PCR (Flu A&B, Covid) Nasopharyngeal Swab     Status: None   Collection Time: 11/26/20  5:43 PM   Specimen: Nasopharyngeal Swab; Nasopharyngeal(NP) swabs in vial transport medium  Result Value Ref Range Status   SARS Coronavirus 2 by RT PCR NEGATIVE NEGATIVE Final    Comment: (NOTE) SARS-CoV-2 target nucleic acids are NOT DETECTED.  The SARS-CoV-2 RNA is generally detectable in upper respiratory specimens during the acute phase of infection. The lowest concentration of SARS-CoV-2 viral copies this assay can detect is 138 copies/mL. A negative result does not preclude SARS-Cov-2 infection and should not be used as the sole basis for treatment or other patient management decisions. A negative result may occur with  improper specimen collection/handling, submission of specimen other than nasopharyngeal swab, presence of viral mutation(s) within the areas targeted by this assay, and inadequate number of viral copies(<138 copies/mL). A negative result must be combined with clinical observations, patient history, and epidemiological information. The expected result is Negative.  Fact Sheet for Patients:  EntrepreneurPulse.com.au  Fact Sheet for Healthcare Providers:   IncredibleEmployment.be  This test is no t yet approved or cleared by the [place] FDA and  has been authorized for detection and/or diagnosis of SARS-CoV-2 by FDA under an Emergency Use Authorization (EUA). This EUA will remain  in effect (meaning this test can be used) for the duration of the COVID-19 declaration under Section 564(b)(1) of the Act, 21 U.S.C.section 360bbb-3(b)(1), unless the authorization is terminated  or revoked sooner.       Influenza A by  PCR NEGATIVE NEGATIVE Final   Influenza B by PCR NEGATIVE NEGATIVE Final    Comment: (NOTE) The Xpert Xpress SARS-CoV-2/FLU/RSV plus assay is intended as an aid in the diagnosis of influenza from Nasopharyngeal swab specimens and should not be used as a sole basis for treatment. Nasal washings and aspirates are unacceptable for Xpert Xpress SARS-CoV-2/FLU/RSV testing.  Fact Sheet for Patients: EntrepreneurPulse.com.au  Fact Sheet for Healthcare Providers: IncredibleEmployment.be  This test is not yet approved or cleared by the [place] FDA and has been authorized for detection and/or diagnosis of SARS-CoV-2 by FDA under an Emergency Use Authorization (EUA). This EUA will remain in effect (meaning this test can be used) for the duration of the COVID-19 declaration under Section 564(b)(1) of the Act, 21 U.S.C. section 360bbb-3(b)(1), unless the authorization is terminated or revoked.  Performed at [facility], [address] [place], [place] [address]      Time coordinating discharge: 49mns  SIGNED:   VHosie Poisson MD  [facility] 12/03/2020, 10:30 AM   If 7PM-7AM, please contact night-coverage [email or web address]

## 2020-12-03 NOTE — TOC Transition Note (Signed)
Transition of Care [facility]) - CM/SW Discharge Note   Patient Details  Name: Isaac [name] MRN: [id number] Date of Birth: 01-Jan-1941  Transition of Care [facility]) CM/SW Contact:  Boneta Lucks, RN Phone Number: 12/03/2020, 11:27 AM   Clinical Narrative:   Patient is medically ready and [facility] is ready for patient today. [name] his wife called back she is on her way to hospital and will go with him to [place]. RN to call report, TOC calling EMS. Clinicals sent in the hub.    Final next level of care: Skilled Nursing Facility Barriers to Discharge: Barriers Resolved  Patient Goals and CMS Choice Patient states their goals for this hospitalization and ongoing recovery are:: agreeable to SNF. CMS Medicare.gov Compare Post Acute Care list provided to:: Patient Represenative (must comment) Choice offered to / list presented to : Spouse  Discharge Placement        Patient to be transferred to facility by: EMS Name of family member notified: Left message for wife [name] Patient and family notified of of transfer: 12/03/20  Discharge Plan and Services     HH Arranged: RN, PT, Social Work [facility] Agency: [place] Date [facility] Agency Contacted: 11/28/20 Time [place]: [id number] Representative spoke with at [place]: Marjory Lies  Readmission Risk Interventions Readmission Risk Prevention Plan 12/03/2020  Post Dischage Appt Complete  Medication Screening Complete  Transportation Screening Complete  Some recent data might be hidden

## 2020-12-04 ENCOUNTER — Other Ambulatory Visit (HOSPITAL_BASED_OUTPATIENT_CLINIC_OR_DEPARTMENT_OTHER): Payer: Self-pay

## 2020-12-04 DIAGNOSIS — R1312 Dysphagia, oropharyngeal phase: Secondary | ICD-10-CM | POA: Diagnosis not present

## 2020-12-04 DIAGNOSIS — Z87891 Personal history of nicotine dependence: Secondary | ICD-10-CM | POA: Diagnosis not present

## 2020-12-04 DIAGNOSIS — I2510 Atherosclerotic heart disease of native coronary artery without angina pectoris: Secondary | ICD-10-CM | POA: Diagnosis not present

## 2020-12-04 DIAGNOSIS — I951 Orthostatic hypotension: Secondary | ICD-10-CM | POA: Diagnosis not present

## 2020-12-04 DIAGNOSIS — I25119 Atherosclerotic heart disease of native coronary artery with unspecified angina pectoris: Secondary | ICD-10-CM | POA: Diagnosis not present

## 2020-12-04 DIAGNOSIS — Z79899 Other long term (current) drug therapy: Secondary | ICD-10-CM | POA: Diagnosis not present

## 2020-12-04 DIAGNOSIS — Y92129 Unspecified place in nursing home as the place of occurrence of the external cause: Secondary | ICD-10-CM | POA: Diagnosis not present

## 2020-12-04 DIAGNOSIS — N400 Enlarged prostate without lower urinary tract symptoms: Secondary | ICD-10-CM | POA: Diagnosis present

## 2020-12-04 DIAGNOSIS — I499 Cardiac arrhythmia, unspecified: Secondary | ICD-10-CM | POA: Diagnosis not present

## 2020-12-04 DIAGNOSIS — F05 Delirium due to known physiological condition: Secondary | ICD-10-CM | POA: Diagnosis present

## 2020-12-04 DIAGNOSIS — G459 Transient cerebral ischemic attack, unspecified: Secondary | ICD-10-CM | POA: Diagnosis not present

## 2020-12-04 DIAGNOSIS — N1832 Chronic kidney disease, stage 3b: Secondary | ICD-10-CM | POA: Diagnosis not present

## 2020-12-04 DIAGNOSIS — R279 Unspecified lack of coordination: Secondary | ICD-10-CM | POA: Diagnosis not present

## 2020-12-04 DIAGNOSIS — I083 Combined rheumatic disorders of mitral, aortic and tricuspid valves: Secondary | ICD-10-CM | POA: Diagnosis not present

## 2020-12-04 DIAGNOSIS — Z85828 Personal history of other malignant neoplasm of skin: Secondary | ICD-10-CM | POA: Diagnosis not present

## 2020-12-04 DIAGNOSIS — I4891 Unspecified atrial fibrillation: Secondary | ICD-10-CM | POA: Diagnosis not present

## 2020-12-04 DIAGNOSIS — I480 Paroxysmal atrial fibrillation: Secondary | ICD-10-CM | POA: Diagnosis not present

## 2020-12-04 DIAGNOSIS — R410 Disorientation, unspecified: Secondary | ICD-10-CM | POA: Diagnosis not present

## 2020-12-04 DIAGNOSIS — I5032 Chronic diastolic (congestive) heart failure: Secondary | ICD-10-CM | POA: Diagnosis not present

## 2020-12-04 DIAGNOSIS — J8410 Pulmonary fibrosis, unspecified: Secondary | ICD-10-CM | POA: Diagnosis not present

## 2020-12-04 DIAGNOSIS — R404 Transient alteration of awareness: Secondary | ICD-10-CM | POA: Diagnosis not present

## 2020-12-04 DIAGNOSIS — I130 Hypertensive heart and chronic kidney disease with heart failure and stage 1 through stage 4 chronic kidney disease, or unspecified chronic kidney disease: Secondary | ICD-10-CM | POA: Diagnosis not present

## 2020-12-04 DIAGNOSIS — G929 Unspecified toxic encephalopathy: Secondary | ICD-10-CM | POA: Diagnosis not present

## 2020-12-04 DIAGNOSIS — Z743 Need for continuous supervision: Secondary | ICD-10-CM | POA: Diagnosis not present

## 2020-12-04 DIAGNOSIS — I252 Old myocardial infarction: Secondary | ICD-10-CM | POA: Diagnosis not present

## 2020-12-04 DIAGNOSIS — E785 Hyperlipidemia, unspecified: Secondary | ICD-10-CM | POA: Diagnosis not present

## 2020-12-04 DIAGNOSIS — R52 Pain, unspecified: Secondary | ICD-10-CM | POA: Diagnosis not present

## 2020-12-04 DIAGNOSIS — Z20822 Contact with and (suspected) exposure to covid-19: Secondary | ICD-10-CM | POA: Diagnosis not present

## 2020-12-04 DIAGNOSIS — K219 Gastro-esophageal reflux disease without esophagitis: Secondary | ICD-10-CM | POA: Diagnosis not present

## 2020-12-04 DIAGNOSIS — Z951 Presence of aortocoronary bypass graft: Secondary | ICD-10-CM | POA: Diagnosis not present

## 2020-12-04 DIAGNOSIS — W19XXXA Unspecified fall, initial encounter: Secondary | ICD-10-CM | POA: Diagnosis present

## 2020-12-04 DIAGNOSIS — R2689 Other abnormalities of gait and mobility: Secondary | ICD-10-CM | POA: Diagnosis not present

## 2020-12-04 DIAGNOSIS — M6281 Muscle weakness (generalized): Secondary | ICD-10-CM | POA: Diagnosis not present

## 2020-12-04 DIAGNOSIS — Z8249 Family history of ischemic heart disease and other diseases of the circulatory system: Secondary | ICD-10-CM | POA: Diagnosis not present

## 2020-12-04 DIAGNOSIS — D696 Thrombocytopenia, unspecified: Secondary | ICD-10-CM | POA: Diagnosis not present

## 2020-12-04 DIAGNOSIS — Z7982 Long term (current) use of aspirin: Secondary | ICD-10-CM | POA: Diagnosis not present

## 2020-12-04 DIAGNOSIS — Z66 Do not resuscitate: Secondary | ICD-10-CM | POA: Diagnosis not present

## 2020-12-04 DIAGNOSIS — F0390 Unspecified dementia without behavioral disturbance: Secondary | ICD-10-CM | POA: Diagnosis present

## 2020-12-04 DIAGNOSIS — I10 Essential (primary) hypertension: Secondary | ICD-10-CM | POA: Diagnosis not present

## 2020-12-04 DIAGNOSIS — E869 Volume depletion, unspecified: Secondary | ICD-10-CM | POA: Diagnosis not present

## 2020-12-04 DIAGNOSIS — R6889 Other general symptoms and signs: Secondary | ICD-10-CM | POA: Diagnosis not present

## 2020-12-04 DIAGNOSIS — Z7401 Bed confinement status: Secondary | ICD-10-CM | POA: Diagnosis not present

## 2020-12-04 DIAGNOSIS — N179 Acute kidney failure, unspecified: Secondary | ICD-10-CM | POA: Diagnosis not present

## 2020-12-04 NOTE — Progress Notes (Signed)
Patient had a fall at 0000 with wife at bedside. Patients wife called out to nurses station and reported that patient was about to fall. Charge nurse enters the patients room and patient was already on the floor. Patient was found on his bottom at the time. I entered the room and found patient on his bottom. Charge nurse and I pulled patient up and placed him back in bed. Vitals are as followed. MD notified.Patient reported not being in any pain. Family member still at bedside. Bed alarm on. Will continue to monitor.     12/04/20 0000  Vitals  Temp 98.1 F (36.7 C)  Temp Source Oral  BP (!) 149/68  MAP (mmHg) 91  BP Method Automatic  Pulse Rate 89  Resp 18  MEWS COLOR  MEWS Score Color Green  Oxygen Therapy  SpO2 98 %  O2 Device Room Air  MEWS Score  MEWS Temp 0  MEWS Systolic 0  MEWS Pulse 0  MEWS RR 0  MEWS LOC 0  MEWS Score 0

## 2020-12-06 ENCOUNTER — Inpatient Hospital Stay (HOSPITAL_COMMUNITY)
Admission: EM | Admit: 2020-12-06 | Discharge: 2020-12-10 | DRG: 682 | Disposition: A | Discharge status: Home Health | Payer: Medicare Other | Source: Skilled Nursing Facility | Attending: Internal Medicine | Admitting: Internal Medicine

## 2020-12-06 ENCOUNTER — Other Ambulatory Visit: Payer: Self-pay

## 2020-12-06 DIAGNOSIS — N400 Enlarged prostate without lower urinary tract symptoms: Secondary | ICD-10-CM | POA: Diagnosis present

## 2020-12-06 DIAGNOSIS — R6889 Other general symptoms and signs: Secondary | ICD-10-CM | POA: Diagnosis not present

## 2020-12-06 DIAGNOSIS — R404 Transient alteration of awareness: Secondary | ICD-10-CM | POA: Diagnosis not present

## 2020-12-06 DIAGNOSIS — I480 Paroxysmal atrial fibrillation: Secondary | ICD-10-CM | POA: Diagnosis not present

## 2020-12-06 DIAGNOSIS — I5032 Chronic diastolic (congestive) heart failure: Secondary | ICD-10-CM | POA: Diagnosis not present

## 2020-12-06 DIAGNOSIS — N179 Acute kidney failure, unspecified: Principal | ICD-10-CM | POA: Diagnosis present

## 2020-12-06 DIAGNOSIS — I130 Hypertensive heart and chronic kidney disease with heart failure and stage 1 through stage 4 chronic kidney disease, or unspecified chronic kidney disease: Secondary | ICD-10-CM | POA: Diagnosis present

## 2020-12-06 DIAGNOSIS — F0390 Unspecified dementia without behavioral disturbance: Secondary | ICD-10-CM | POA: Diagnosis present

## 2020-12-06 DIAGNOSIS — R410 Disorientation, unspecified: Secondary | ICD-10-CM | POA: Diagnosis present

## 2020-12-06 DIAGNOSIS — I4891 Unspecified atrial fibrillation: Secondary | ICD-10-CM | POA: Diagnosis present

## 2020-12-06 DIAGNOSIS — N1832 Chronic kidney disease, stage 3b: Secondary | ICD-10-CM | POA: Diagnosis present

## 2020-12-06 DIAGNOSIS — Y92129 Unspecified place in nursing home as the place of occurrence of the external cause: Secondary | ICD-10-CM | POA: Diagnosis not present

## 2020-12-06 DIAGNOSIS — Z85828 Personal history of other malignant neoplasm of skin: Secondary | ICD-10-CM | POA: Diagnosis not present

## 2020-12-06 DIAGNOSIS — Z7982 Long term (current) use of aspirin: Secondary | ICD-10-CM | POA: Diagnosis not present

## 2020-12-06 DIAGNOSIS — I2510 Atherosclerotic heart disease of native coronary artery without angina pectoris: Secondary | ICD-10-CM | POA: Diagnosis present

## 2020-12-06 DIAGNOSIS — Z79899 Other long term (current) drug therapy: Secondary | ICD-10-CM | POA: Diagnosis not present

## 2020-12-06 DIAGNOSIS — Z87891 Personal history of nicotine dependence: Secondary | ICD-10-CM | POA: Diagnosis not present

## 2020-12-06 DIAGNOSIS — I083 Combined rheumatic disorders of mitral, aortic and tricuspid valves: Secondary | ICD-10-CM | POA: Diagnosis not present

## 2020-12-06 DIAGNOSIS — Z66 Do not resuscitate: Secondary | ICD-10-CM | POA: Diagnosis present

## 2020-12-06 DIAGNOSIS — I10 Essential (primary) hypertension: Secondary | ICD-10-CM | POA: Diagnosis present

## 2020-12-06 DIAGNOSIS — E785 Hyperlipidemia, unspecified: Secondary | ICD-10-CM | POA: Diagnosis present

## 2020-12-06 DIAGNOSIS — Z7401 Bed confinement status: Secondary | ICD-10-CM | POA: Diagnosis not present

## 2020-12-06 DIAGNOSIS — G929 Unspecified toxic encephalopathy: Secondary | ICD-10-CM | POA: Diagnosis not present

## 2020-12-06 DIAGNOSIS — R531 Weakness: Secondary | ICD-10-CM | POA: Diagnosis not present

## 2020-12-06 DIAGNOSIS — Z20822 Contact with and (suspected) exposure to covid-19: Secondary | ICD-10-CM | POA: Diagnosis not present

## 2020-12-06 DIAGNOSIS — Z951 Presence of aortocoronary bypass graft: Secondary | ICD-10-CM

## 2020-12-06 DIAGNOSIS — I25119 Atherosclerotic heart disease of native coronary artery with unspecified angina pectoris: Secondary | ICD-10-CM | POA: Diagnosis not present

## 2020-12-06 DIAGNOSIS — I252 Old myocardial infarction: Secondary | ICD-10-CM

## 2020-12-06 DIAGNOSIS — Z9181 History of falling: Secondary | ICD-10-CM | POA: Diagnosis not present

## 2020-12-06 DIAGNOSIS — K219 Gastro-esophageal reflux disease without esophagitis: Secondary | ICD-10-CM | POA: Diagnosis present

## 2020-12-06 DIAGNOSIS — E869 Volume depletion, unspecified: Secondary | ICD-10-CM | POA: Diagnosis not present

## 2020-12-06 DIAGNOSIS — F05 Delirium due to known physiological condition: Secondary | ICD-10-CM | POA: Diagnosis present

## 2020-12-06 DIAGNOSIS — W19XXXA Unspecified fall, initial encounter: Secondary | ICD-10-CM | POA: Diagnosis present

## 2020-12-06 DIAGNOSIS — R4182 Altered mental status, unspecified: Secondary | ICD-10-CM

## 2020-12-06 DIAGNOSIS — T424X5A Adverse effect of benzodiazepines, initial encounter: Secondary | ICD-10-CM | POA: Diagnosis present

## 2020-12-06 DIAGNOSIS — I499 Cardiac arrhythmia, unspecified: Secondary | ICD-10-CM | POA: Diagnosis not present

## 2020-12-06 DIAGNOSIS — J8410 Pulmonary fibrosis, unspecified: Secondary | ICD-10-CM | POA: Diagnosis present

## 2020-12-06 DIAGNOSIS — Z8249 Family history of ischemic heart disease and other diseases of the circulatory system: Secondary | ICD-10-CM | POA: Diagnosis not present

## 2020-12-06 DIAGNOSIS — Z743 Need for continuous supervision: Secondary | ICD-10-CM | POA: Diagnosis not present

## 2020-12-06 DIAGNOSIS — R52 Pain, unspecified: Secondary | ICD-10-CM | POA: Diagnosis not present

## 2020-12-06 NOTE — ED Triage Notes (Signed)
To ED room 16 via stretcher with EMS from [facility]. Patient with altered mental status, reportedly fell three times tonight, also complained of leg/hip pain. Patient is calm on arrival.

## 2020-12-07 ENCOUNTER — Emergency Department (HOSPITAL_COMMUNITY): Payer: Medicare Other

## 2020-12-07 ENCOUNTER — Encounter (HOSPITAL_COMMUNITY): Payer: Self-pay | Admitting: Family Medicine

## 2020-12-07 ENCOUNTER — Other Ambulatory Visit: Payer: Self-pay

## 2020-12-07 DIAGNOSIS — N400 Enlarged prostate without lower urinary tract symptoms: Secondary | ICD-10-CM | POA: Diagnosis present

## 2020-12-07 DIAGNOSIS — Z85828 Personal history of other malignant neoplasm of skin: Secondary | ICD-10-CM | POA: Diagnosis not present

## 2020-12-07 DIAGNOSIS — I2510 Atherosclerotic heart disease of native coronary artery without angina pectoris: Secondary | ICD-10-CM | POA: Diagnosis present

## 2020-12-07 DIAGNOSIS — Y92129 Unspecified place in nursing home as the place of occurrence of the external cause: Secondary | ICD-10-CM | POA: Diagnosis not present

## 2020-12-07 DIAGNOSIS — N1832 Chronic kidney disease, stage 3b: Secondary | ICD-10-CM

## 2020-12-07 DIAGNOSIS — I5032 Chronic diastolic (congestive) heart failure: Secondary | ICD-10-CM | POA: Diagnosis present

## 2020-12-07 DIAGNOSIS — Z8249 Family history of ischemic heart disease and other diseases of the circulatory system: Secondary | ICD-10-CM | POA: Diagnosis not present

## 2020-12-07 DIAGNOSIS — Z20822 Contact with and (suspected) exposure to covid-19: Secondary | ICD-10-CM | POA: Diagnosis present

## 2020-12-07 DIAGNOSIS — K219 Gastro-esophageal reflux disease without esophagitis: Secondary | ICD-10-CM | POA: Diagnosis present

## 2020-12-07 DIAGNOSIS — I25119 Atherosclerotic heart disease of native coronary artery with unspecified angina pectoris: Secondary | ICD-10-CM

## 2020-12-07 DIAGNOSIS — Z66 Do not resuscitate: Secondary | ICD-10-CM | POA: Diagnosis present

## 2020-12-07 DIAGNOSIS — I4891 Unspecified atrial fibrillation: Secondary | ICD-10-CM | POA: Diagnosis not present

## 2020-12-07 DIAGNOSIS — F05 Delirium due to known physiological condition: Secondary | ICD-10-CM | POA: Diagnosis present

## 2020-12-07 DIAGNOSIS — Z951 Presence of aortocoronary bypass graft: Secondary | ICD-10-CM | POA: Diagnosis not present

## 2020-12-07 DIAGNOSIS — J8410 Pulmonary fibrosis, unspecified: Secondary | ICD-10-CM | POA: Diagnosis present

## 2020-12-07 DIAGNOSIS — I10 Essential (primary) hypertension: Secondary | ICD-10-CM

## 2020-12-07 DIAGNOSIS — I083 Combined rheumatic disorders of mitral, aortic and tricuspid valves: Secondary | ICD-10-CM | POA: Diagnosis present

## 2020-12-07 DIAGNOSIS — Z87891 Personal history of nicotine dependence: Secondary | ICD-10-CM | POA: Diagnosis not present

## 2020-12-07 DIAGNOSIS — N179 Acute kidney failure, unspecified: Secondary | ICD-10-CM | POA: Diagnosis present

## 2020-12-07 DIAGNOSIS — R410 Disorientation, unspecified: Secondary | ICD-10-CM

## 2020-12-07 DIAGNOSIS — I480 Paroxysmal atrial fibrillation: Secondary | ICD-10-CM | POA: Insufficient documentation

## 2020-12-07 DIAGNOSIS — Z79899 Other long term (current) drug therapy: Secondary | ICD-10-CM | POA: Diagnosis not present

## 2020-12-07 DIAGNOSIS — I130 Hypertensive heart and chronic kidney disease with heart failure and stage 1 through stage 4 chronic kidney disease, or unspecified chronic kidney disease: Secondary | ICD-10-CM | POA: Diagnosis present

## 2020-12-07 DIAGNOSIS — I252 Old myocardial infarction: Secondary | ICD-10-CM | POA: Diagnosis not present

## 2020-12-07 DIAGNOSIS — F0390 Unspecified dementia without behavioral disturbance: Secondary | ICD-10-CM | POA: Diagnosis present

## 2020-12-07 DIAGNOSIS — W19XXXA Unspecified fall, initial encounter: Secondary | ICD-10-CM | POA: Diagnosis present

## 2020-12-07 DIAGNOSIS — E869 Volume depletion, unspecified: Secondary | ICD-10-CM | POA: Diagnosis present

## 2020-12-07 DIAGNOSIS — G929 Unspecified toxic encephalopathy: Secondary | ICD-10-CM | POA: Diagnosis present

## 2020-12-07 DIAGNOSIS — E785 Hyperlipidemia, unspecified: Secondary | ICD-10-CM | POA: Diagnosis present

## 2020-12-07 DIAGNOSIS — Z7982 Long term (current) use of aspirin: Secondary | ICD-10-CM | POA: Diagnosis not present

## 2020-12-07 LAB — URINALYSIS, ROUTINE W REFLEX MICROSCOPIC
Bilirubin Urine: NEGATIVE
Glucose, UA: NEGATIVE mg/dL
Hgb urine dipstick: NEGATIVE
Ketones, ur: NEGATIVE mg/dL
Leukocytes,Ua: NEGATIVE
Nitrite: NEGATIVE
Protein, ur: NEGATIVE mg/dL
Specific Gravity, Urine: 1.015 (ref 1.005–1.030)
pH: 5 (ref 5.0–8.0)

## 2020-12-07 LAB — COMPREHENSIVE METABOLIC PANEL
ALT: 15 U/L (ref 0–44)
AST: 22 U/L (ref 15–41)
Albumin: 3.6 g/dL (ref 3.5–5.0)
Alkaline Phosphatase: 71 U/L (ref 38–126)
Anion gap: 10 (ref 5–15)
BUN: 32 mg/dL — ABNORMAL HIGH (ref 8–23)
CO2: 22 mmol/L (ref 22–32)
Calcium: 8.6 mg/dL — ABNORMAL LOW (ref 8.9–10.3)
Chloride: 104 mmol/L (ref 98–111)
Creatinine, Ser: 2.04 mg/dL — ABNORMAL HIGH (ref 0.61–1.24)
GFR, Estimated: 33 mL/min — ABNORMAL LOW (ref >60)
Glucose, Bld: 159 mg/dL — ABNORMAL HIGH (ref 70–99)
Potassium: 4.1 mmol/L (ref 3.5–5.1)
Sodium: 136 mmol/L (ref 135–145)
Total Bilirubin: 0.8 mg/dL (ref 0.3–1.2)
Total Protein: 7.3 g/dL (ref 6.5–8.1)

## 2020-12-07 LAB — CBC WITH DIFFERENTIAL/PLATELET
Abs Immature Granulocytes: 0.07 10*3/uL (ref 0.00–0.07)
Basophils Absolute: 0.1 10*3/uL (ref 0.0–0.1)
Basophils Relative: 1 %
Eosinophils Absolute: 0.1 10*3/uL (ref 0.0–0.5)
Eosinophils Relative: 0 %
HCT: 39.4 % (ref 39.0–52.0)
Hemoglobin: 13.3 g/dL (ref 13.0–17.0)
Immature Granulocytes: 1 %
Lymphocytes Relative: 10 %
Lymphs Abs: 1.2 10*3/uL (ref 0.7–4.0)
MCH: 33.1 pg (ref 26.0–34.0)
MCHC: 33.8 g/dL (ref 30.0–36.0)
MCV: 98 fL (ref 80.0–100.0)
Monocytes Absolute: 0.8 10*3/uL (ref 0.1–1.0)
Monocytes Relative: 6 %
Neutro Abs: 9.9 10*3/uL — ABNORMAL HIGH (ref 1.7–7.7)
Neutrophils Relative %: 82 %
Platelets: 185 10*3/uL (ref 150–400)
RBC: 4.02 MIL/uL — ABNORMAL LOW (ref 4.22–5.81)
RDW: 12.8 % (ref 11.5–15.5)
WBC: 12.1 10*3/uL — ABNORMAL HIGH (ref 4.0–10.5)
nRBC: 0 % (ref 0.0–0.2)

## 2020-12-07 LAB — SODIUM, URINE, RANDOM: Sodium, Ur: 63 mmol/L

## 2020-12-07 LAB — SARS CORONAVIRUS 2 (TAT 6-24 HRS): SARS Coronavirus 2: NEGATIVE

## 2020-12-07 LAB — MAGNESIUM: Magnesium: 1.8 mg/dL (ref 1.7–2.4)

## 2020-12-07 LAB — AMMONIA: Ammonia: 10 umol/L (ref 9–35)

## 2020-12-07 LAB — CREATININE, URINE, RANDOM: Creatinine, Urine: 146.52 mg/dL

## 2020-12-07 MED ORDER — MEMANTINE HCL 10 MG PO TABS
10.0000 mg | ORAL_TABLET | Freq: Two times a day (BID) | ORAL | Status: DC
  Administered 2020-12-07 – 2020-12-10 (×7): 10 mg via ORAL
  Filled 2020-12-07 (×7): qty 1

## 2020-12-07 MED ORDER — LORAZEPAM 2 MG/ML IJ SOLN
1.0000 mg | INTRAMUSCULAR | Status: DC | PRN
  Administered 2020-12-07: 1 mg via INTRAVENOUS
  Filled 2020-12-07: qty 1

## 2020-12-07 MED ORDER — AMLODIPINE BESYLATE 5 MG PO TABS
2.5000 mg | ORAL_TABLET | Freq: Every day | ORAL | Status: DC
  Administered 2020-12-07 – 2020-12-10 (×4): 2.5 mg via ORAL
  Filled 2020-12-07 (×4): qty 1

## 2020-12-07 MED ORDER — ASPIRIN EC 81 MG PO TBEC
81.0000 mg | DELAYED_RELEASE_TABLET | Freq: Every day | ORAL | Status: DC
  Administered 2020-12-07 – 2020-12-10 (×3): 81 mg via ORAL
  Filled 2020-12-07 (×4): qty 1

## 2020-12-07 MED ORDER — PANTOPRAZOLE SODIUM 40 MG PO TBEC
40.0000 mg | DELAYED_RELEASE_TABLET | Freq: Every day | ORAL | Status: DC
  Administered 2020-12-07 – 2020-12-08 (×2): 40 mg via ORAL
  Filled 2020-12-07 (×3): qty 1

## 2020-12-07 MED ORDER — METOPROLOL SUCCINATE ER 50 MG PO TB24
50.0000 mg | ORAL_TABLET | Freq: Every day | ORAL | Status: DC
  Administered 2020-12-07 – 2020-12-10 (×4): 50 mg via ORAL
  Filled 2020-12-07 (×4): qty 1

## 2020-12-07 MED ORDER — ONDANSETRON HCL 4 MG/2ML IJ SOLN: 4.0000 mg | Freq: Four times a day (QID) | INTRAMUSCULAR | Status: DC | PRN

## 2020-12-07 MED ORDER — MAGNESIUM SULFATE IN D5W 1-5 GM/100ML-% IV SOLN
1.0000 g | Freq: Once | INTRAVENOUS | Status: AC
  Administered 2020-12-07: 1 g via INTRAVENOUS
  Filled 2020-12-07: qty 100

## 2020-12-07 MED ORDER — SODIUM CHLORIDE 0.9 % IV SOLN: INTRAVENOUS | Status: AC

## 2020-12-07 MED ORDER — SENNOSIDES-DOCUSATE SODIUM 8.6-50 MG PO TABS: 1.0000 | ORAL_TABLET | Freq: Every evening | ORAL | Status: DC | PRN

## 2020-12-07 MED ORDER — ONDANSETRON HCL 4 MG PO TABS: 4.0000 mg | ORAL_TABLET | Freq: Four times a day (QID) | ORAL | Status: DC | PRN

## 2020-12-07 MED ORDER — VITAMIN B-12 1000 MCG PO TABS
1000.0000 ug | ORAL_TABLET | Freq: Every day | ORAL | Status: DC
  Administered 2020-12-07 – 2020-12-10 (×4): 1000 ug via ORAL
  Filled 2020-12-07 (×4): qty 1

## 2020-12-07 MED ORDER — QUETIAPINE FUMARATE 25 MG PO TABS
75.0000 mg | ORAL_TABLET | Freq: Every day | ORAL | Status: DC
  Administered 2020-12-07 – 2020-12-09 (×3): 75 mg via ORAL
  Filled 2020-12-07 (×3): qty 3

## 2020-12-07 MED ORDER — SODIUM CHLORIDE 0.9 % IV SOLN: INTRAVENOUS | Status: DC

## 2020-12-07 MED ORDER — HEPARIN SODIUM (PORCINE) 5000 UNIT/ML IJ SOLN
5000.0000 [IU] | Freq: Three times a day (TID) | INTRAMUSCULAR | Status: DC
  Administered 2020-12-07 – 2020-12-10 (×11): 5000 [IU] via SUBCUTANEOUS
  Filled 2020-12-07 (×11): qty 1

## 2020-12-07 MED ORDER — MOMETASONE FURO-FORMOTEROL FUM 200-5 MCG/ACT IN AERO
2.0000 | INHALATION_SPRAY | Freq: Two times a day (BID) | RESPIRATORY_TRACT | Status: DC
  Administered 2020-12-07 – 2020-12-10 (×5): 2 via RESPIRATORY_TRACT
  Filled 2020-12-07 (×2): qty 8.8

## 2020-12-07 MED ORDER — ACETAMINOPHEN 325 MG PO TABS: 650.0000 mg | ORAL_TABLET | Freq: Four times a day (QID) | ORAL | Status: DC | PRN

## 2020-12-07 MED ORDER — SODIUM CHLORIDE 0.9 % IV BOLUS
500.0000 mL | Freq: Once | INTRAVENOUS | Status: AC
  Administered 2020-12-07: 500 mL via INTRAVENOUS

## 2020-12-07 MED ORDER — SIMVASTATIN 20 MG PO TABS
20.0000 mg | ORAL_TABLET | Freq: Every day | ORAL | Status: DC
  Administered 2020-12-07 – 2020-12-09 (×3): 20 mg via ORAL
  Filled 2020-12-07 (×3): qty 1

## 2020-12-07 MED ORDER — METOPROLOL TARTRATE 5 MG/5ML IV SOLN
2.5000 mg | Freq: Once | INTRAVENOUS | Status: AC
  Administered 2020-12-07: 2.5 mg via INTRAVENOUS
  Filled 2020-12-07: qty 5

## 2020-12-07 MED ORDER — ACETAMINOPHEN 650 MG RE SUPP: 650.0000 mg | Freq: Four times a day (QID) | RECTAL | Status: DC | PRN

## 2020-12-07 MED ORDER — PIRFENIDONE 267 MG PO TABS: 534.0000 mg | ORAL_TABLET | Freq: Three times a day (TID) | ORAL | Status: DC

## 2020-12-07 MED ORDER — ALBUTEROL SULFATE (2.5 MG/3ML) 0.083% IN NEBU: 2.5000 mg | INHALATION_SOLUTION | Freq: Four times a day (QID) | RESPIRATORY_TRACT | Status: DC | PRN

## 2020-12-07 NOTE — Progress Notes (Signed)
1715: Pt arrived to room 311 from ED via stretcher. Moved to bed with assist x4. Pt awake, alert but does not answer questions and does not follow commands. Pt resistive to care, swats at staff when touched or moved. Pt restless, moving around in bed, twisting sideways. Padding added to siderails for pt safety, bed alarm on.  Pt with wet diaper noted, diaper removed and pt bathed. Male purewick intact to wall suction.  IV site WNL, IVF infusing without s/s infiltration. IV site wrapped for safety/protection. Skin warm and dry, no s/s breakdown noted. Pt moves all extremities without difficulty. 1740: Family at bedside at this time, assisting assigned nurse with admission questions.

## 2020-12-07 NOTE — H&P (Signed)
History and Physical    Isaac [name] [id number] DOB: Mar 22, 1941 DOA: 12/06/2020  PCP: Dettinger, Fransisca Kaufmann, MD   Patient coming from: SNF   Chief Complaint: AMS, falls, hip pain   HPI: Isaac [name] is a 80 y.o. male with medical history significant for CAD, dementia, delirium, chronic kidney disease 3B, and hypertension, now presenting to the emergency department from his SNF for evaluation of confusion and falls.  Patient was discharged from the hospital to SNF on 12/02/2020, reportedly fell 3 times last night, was confused, and complaining of hip pain.  Patient was calm on arrival to the ED initially but not providing much history.  He is hard of hearing and hearing aids were left at the SNF.  He is responding to basic questions appropriately when addressed with a loud voice and is oriented to person, place, year, but not month.  He denied headache, chest pain, or abdominal pain, and also denies hip pain at this time.  He was admitted to the hospital from 11/26/2020 until 12/02/2020 with gait instability, dysarthria, and delirium.  He had extensive work-up with MRI, EEG, TSH, folate, and B12 levels during the admission, was started on B12 supplement, and seen by neurology with his condition and attributed to dementia and sundowning.  He was started on Seroquel.  ED Course: Upon arrival to the ED, patient is found to be afebrile, saturating well on room air, and with stable blood pressure.  EKG features atrial fibrillation with rate 143.  Chest x-ray negative for acute cardiopulmonary disease.  Radiographs of the pelvis and bilateral hips was negative.  Head CT negative for acute intracranial abnormality.  Chemistry panel notable for creatinine of 2.04.  CBC with mild leukocytosis.  Patient was given 2.5 mg IV Lopressor.  He converted back to sinus rhythm in the ED.  Review of Systems:  ROS limited by the patient's clinical condition.  Past Medical History:  Diagnosis Date   Allergic rhinitis     Allergy    Arthritis 06/04/2014   Trial of sulindac 05/30/14 since cri risk with other nsaids    Barrett's esophagus 10/27/2011   BPH (benign prostatic hyperplasia) 02/20/2014   CAD (coronary artery disease) of artery bypass graft, Occluded VG-non dominant LCX medical therapy 11/25/2013   CAD (coronary artery disease), native coronary artery - LIMA-LAD, SVG-Diag, SVG-RCA 2002    Catheterization 2002 showing occluded LAD, 99% diagonal, occluded distal circumflex, 90% nondominant right coronary artery  CABG 08/06/00 by Dr. Nils Pyle with LIMA to LAD, SVG to diagonal, SVG to right coronary artery, circumflex was noted to be too small to graft     CAP (community acquired pneumonia) 04/16/2016   See cxr 04/15/16 > treated with 7 days of Levaquin 500 mg daily with resolution radiographically and clinically.   Cataract    Cerumen impaction 06/05/2015   CHF (congestive heart failure), NYHA class III ([place]) 02/20/2014   Dyspnea    with exertion    Dyspnea on exertion 11/08/2013   Followed as Primary Care Patient/ [facility]/ [name]  - new onset early June 2015  - 11/08/2013  Walked RA x 3 laps @ 185 ft each stopped due to  End of study, mild sob, no cp and no desat, EKG ok  - Cardiac w/u inconclusive but not clearly having ischemia  11/24/13   - 09/11/2016  Walked RA x 3 laps @ 185 ft each stopped due to  End of study, nl pace, no sob or desat   -  Spirometry 09/11/2016     Essential hypertension    Referred to [facility] med [facility] 12/12/2016    GERD (gastroesophageal reflux disease)    H/O hiatal hernia    History of skin cancer    bilateral arms with removal   Hyperlipidemia    Hyperlipidemia LDL goal <70    Followed as Primary Care Patient/ [facility]/ [name]     - Target LDL < 70 as has IHD     Hypertension    Hypertensive heart disease    Followed as Primary Care Patient/ [facility]/ [name]      Ischemic heart disease    Left femoral hernia s/p laparoscopic repair 05/28/2017 05/28/2017    Lumbar disc disease    Myocardial infarction [facility]) 2002   Right inguinal hernia s/p laparoscopic repair 05/28/2017 05/28/2017   Stage 3 chronic kidney disease ([place]) 05/22/2015   trial off lasix/ppi/clinoril / [place] and renal eval 05/22/2015 >>>     T wave inversion in EKG 02/20/2014   Unstable angina ([place]) 11/23/2013    Past Surgical History:  Procedure Laterality Date   COLONOSCOPY     CORONARY ARTERY BYPASS GRAFT  2002   LIMA-LAD, SVG-Diag, SVG-RCA   EYE SURGERY Bilateral 1995, 2000   ioc for cataracts   GROIN DISSECTION  01/23/2012   Procedure: Virl Son EXPLORATION;  Surgeon: Harl Bowie, MD;  Location: [facility];  Service: General;  Laterality: Left;  Left Inguinal Exploration, release of scar tissue, Placement of Mesh   INGUINAL HERNIA REPAIR Left [place] Bilateral 05/28/2017   Procedure: LAPAROSCOPIC RIGHT AND LEFT INGUINAL HERNIA REPAIR WITH MESH;  Surgeon: Michael Boston, MD;  Location: [place];  Service: General;  Laterality: Bilateral;   INSERTION OF MESH Bilateral 05/28/2017   Procedure: INSERTION OF MESH;  Surgeon: Michael Boston, MD;  Location: [place];  Service: General;  Laterality: Bilateral;   LEFT HEART CATHETERIZATION WITH CORONARY ANGIOGRAM N/A 11/24/2013   Procedure: LEFT HEART CATHETERIZATION WITH CORONARY ANGIOGRAM;  Surgeon: Peter M Martinique, MD;  Location: [facility] CATH LAB;  Service: Cardiovascular;  Laterality: N/A;   NOSE SURGERY  1985    Social History:   reports that he has never smoked. He quit smokeless tobacco use about 6 years ago.  His smokeless tobacco use included chew. He reports that he does not drink alcohol and does not use drugs.  No Known Allergies  Family History  Problem Relation Age of Onset   Breast cancer Mother    Diabetes Mother    Hypertension Father    Dementia Father 31       early 33s   Diabetes Sister    COPD Brother        smoker   Colon cancer Neg Hx    Esophageal cancer Neg Hx     Rectal cancer Neg Hx    Stomach cancer Neg Hx      Prior to Admission medications   Medication Sig Start Date End Date Taking? Authorizing Provider  albuterol (VENTOLIN HFA) 108 (90 Base) MCG/ACT inhaler Inhale 2 puffs into the lungs every 6 (six) hours as needed for wheezing or shortness of breath. 02/09/20   Noemi Chapel P, DO  amLODipine (NORVASC) 2.5 MG tablet Take 2.5 mg by mouth daily. 09/13/20   [provider]  aspirin 81 MG EC tablet Take 81 mg by mouth every morning.    [provider]  Cholecalciferol (VITAMIN D) 2000 UNITS tablet Take 2,000  Units by mouth daily.    [provider]  docusate sodium (COLACE) 100 MG capsule Take 1 capsule by mouth every other day    [provider]  fluticasone (FLONASE) 50 MCG/ACT nasal spray Place 2 sprays into both nostrils daily as needed for allergies or rhinitis.     [provider]  Fluticasone-Salmeterol (ADVAIR DISKUS) 250-50 MCG/DOSE AEPB Inhale 1 puff into the lungs 2 (two) times daily. 11/01/19   Julian Hy, DO  furosemide (LASIX) 40 MG tablet Take 1 tablet (40 mg total) by mouth daily as needed for fluid or edema. 07/06/20   Dettinger, Fransisca Kaufmann, MD  memantine (NAMENDA) 10 MG tablet Take 1 tablet (10 mg total) by mouth 2 (two) times daily. Start after finishing the namenda starter pack first 07/16/20   Garvin Fila, MD  metoprolol succinate (TOPROL-XL) 50 MG 24 hr tablet Take 1 tablet (50 mg total) by mouth daily. 12/01/20   Kathie Dike, MD  Multiple Vitamin (MULTIVITAMIN WITH MINERALS) TABS Take 1 tablet by mouth daily.    [provider]  multivitamin-lutein (OCUVITE-LUTEIN) CAPS capsule Take 1 capsule by mouth daily.    [provider]  nitroGLYCERIN (NITROSTAT) 0.4 MG SL tablet DISSOLVE ONE TABLET UNDER THE TONGUE EVERY 5 MINUTES AS NEEDED FOR CHEST PAIN.  DO NOT EXCEED A TOTAL OF 3 DOSES IN 15 MINUTES 11/05/20   Dettinger, Fransisca Kaufmann, MD  omeprazole (PRILOSEC) 20 MG  capsule Take 1 capsule (20 mg total) by mouth 2 (two) times daily. 02/13/20   Dettinger, Fransisca Kaufmann, MD  ondansetron (ZOFRAN) 4 MG tablet TAKE 1 TABLET BY MOUTH THREE TIMES DAILY AS NEEDED FOR NAUSEA AND VOMITING 08/29/20   Mannam, Praveen, MD  Pirfenidone (ESBRIET) 267 MG TABS TAKE 2 TABLETS (534 MG TOTAL) BY MOUTH IN THE MORNING, AT NOON, AND AT BEDTIME. 09/10/20 09/10/21  Mannam, Hart Robinsons, MD  potassium chloride SA (KLOR-CON M20) 20 MEQ tablet TAKE ONE TABLET BY MOUTH ONCE DAILY WHEN  YOU  TAKE LASIX 08/12/17   Dettinger, Fransisca Kaufmann, MD  QUEtiapine (SEROQUEL) 25 MG tablet Take 3 tablets (75 mg total) by mouth at bedtime. 12/01/20   Kathie Dike, MD  simethicone (MYLICON) 0000000 MG chewable tablet Chew 125 mg by mouth every 6 (six) hours as needed for flatulence.    [provider]  simvastatin (ZOCOR) 20 MG tablet TAKE 1 TABLET BY MOUTH AT BEDTIME 10/26/20   Dettinger, Fransisca Kaufmann, MD  vitamin B-12 (CYANOCOBALAMIN) 1000 MCG tablet Take 1 tablet (1,000 mcg total) by mouth daily. 12/01/20   Kathie Dike, MD    Physical Exam: Vitals:   12/07/20 0400 12/07/20 0430 12/07/20 0500 12/07/20 0530  BP: (!) 153/84 134/75 124/79 (!) 149/77  Pulse:      Resp: '19 20 20 16  '$ Temp:      TempSrc:      SpO2: 100% 100% 100% 98%    Constitutional: NAD, calm  Eyes: PERTLA, lids and conjunctivae normal ENMT: Mucous membranes are moist. Posterior pharynx clear of any exudate or lesions.   Neck: supple, no masses  Respiratory: no wheezing, no crackles. No accessory muscle use.  Cardiovascular: S1 & S2 heard, regular rate and rhythm. No extremity edema.   Abdomen: No distension, no tenderness, soft. Bowel sounds active.  Musculoskeletal: no clubbing / cyanosis. No joint deformity upper and lower extremities.   Skin: no significant rashes, lesions, ulcers. Warm, dry, well-perfused. Neurologic: hearing deficit, CN II-XII grossly intact otherwise. Sensation intact. Strength 5/5 in all  4 limbs.  Psychiatric: Alert and  oriented to person and place only. Pleasant and cooperative.    Labs and Imaging on Admission: I have personally reviewed following labs and imaging studies  CBC: Recent Labs  Lab 12/07/20 0030  WBC 12.1*  NEUTROABS 9.9*  HGB 13.3  HCT 39.4  MCV 98.0  PLT 123XX123   Basic Metabolic Panel: Recent Labs  Lab 12/07/20 0030  NA 136  K 4.1  CL 104  CO2 22  GLUCOSE 159*  BUN 32*  CREATININE 2.04*  CALCIUM 8.6*  MG 1.8   GFR: Estimated Creatinine Clearance: 27.3 mL/min (A) (by C-G formula based on SCr of 2.04 mg/dL (H)). Liver Function Tests: Recent Labs  Lab 12/07/20 0030  AST 22  ALT 15  ALKPHOS 71  BILITOT 0.8  PROT 7.3  ALBUMIN 3.6   No results for input(s): LIPASE, AMYLASE in the last 168 hours. Recent Labs  Lab 12/07/20 0031  AMMONIA 10   Coagulation Profile: No results for input(s): INR, PROTIME in the last 168 hours. Cardiac Enzymes: No results for input(s): CKTOTAL, CKMB, CKMBINDEX, TROPONINI in the last 168 hours. BNP (last 3 results) Recent Labs    03/26/20 1124  PROBNP 5,675*   HbA1C: No results for input(s): HGBA1C in the last 72 hours. CBG: No results for input(s): GLUCAP in the last 168 hours. Lipid Profile: No results for input(s): CHOL, HDL, LDLCALC, TRIG, CHOLHDL, LDLDIRECT in the last 72 hours. Thyroid Function Tests: No results for input(s): TSH, T4TOTAL, FREET4, T3FREE, THYROIDAB in the last 72 hours. Anemia Panel: No results for input(s): VITAMINB12, FOLATE, FERRITIN, TIBC, IRON, RETICCTPCT in the last 72 hours. Urine analysis:    Component Value Date/Time   COLORURINE YELLOW 11/26/2020 2318   APPEARANCEUR CLEAR 11/26/2020 2318   APPEARANCEUR Clear 04/11/2020 1035   LABSPEC 1.009 11/26/2020 2318   PHURINE 6.0 11/26/2020 2318   GLUCOSEU NEGATIVE 11/26/2020 2318   GLUCOSEU NEGATIVE 09/06/2012 0925   HGBUR NEGATIVE 11/26/2020 2318   BILIRUBINUR NEGATIVE 11/26/2020 2318   BILIRUBINUR Negative 04/11/2020 [place]  11/26/2020 2318   PROTEINUR NEGATIVE 11/26/2020 2318   UROBILINOGEN 0.2 09/06/2012 0925   NITRITE NEGATIVE 11/26/2020 2318   LEUKOCYTESUR NEGATIVE 11/26/2020 2318   Sepsis Labs: '@LABRCNTIP'$ (procalcitonin:4,lacticidven:4) )No results found for this or any previous visit (from the past 240 hour(s)).   Radiological Exams on Admission: DG Chest 1 View  Result Date: 12/07/2020 CLINICAL DATA:  Altered mental status EXAM: CHEST  1 VIEW COMPARISON:  None. FINDINGS: Prior CABG. Scarring in the lung bases. No acute confluent opacities or effusions. Heart is upper limits normal in size. IMPRESSION: Bibasilar scarring.  No active disease. Electronically Signed   By: Rolm Baptise M.D.   On: 12/07/2020 01:25   CT HEAD WO CONTRAST (5MM)  Result Date: 12/07/2020 CLINICAL DATA:  Mental status change, unknown cause.  Fall. EXAM: CT HEAD WITHOUT CONTRAST TECHNIQUE: Contiguous axial images were obtained from the base of the skull through the vertex without intravenous contrast. COMPARISON:  11/26/2020 FINDINGS: Brain: There is atrophy and chronic small vessel disease changes. Small old bilateral lacunar infarcts in the cerebellar hemispheres. No acute intracranial abnormality. Specifically, no hemorrhage, hydrocephalus, mass lesion, acute infarction, or significant intracranial injury. Vascular: No hyperdense vessel or unexpected calcification. Skull: No acute calvarial abnormality. Sinuses/Orbits: No acute findings Other: None IMPRESSION: Atrophy, chronic microvascular disease. No acute intracranial abnormality. 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 Electronically Signed   By: Rolm Baptise M.D.   On: 12/07/2020  01:24   DG HIPS BILAT WITH PELVIS MIN 5 VIEWS  Result Date: 12/07/2020 CLINICAL DATA:  Altered mental status, fall EXAM: DG HIP (WITH OR WITHOUT PELVIS) 5+V BILAT COMPARISON:  None. FINDINGS: Hip joints and SI joints symmetric and unremarkable. No acute bony abnormality. Specifically, no fracture,  subluxation, or dislocation. IMPRESSION: Negative. Electronically Signed   By: Rolm Baptise M.D.   On: 12/07/2020 01:25    EKG: Independently reviewed. Atrial fibrillation with RVR, rate 143.   Assessment/Plan   1. Acute kidney injury superimposed on CKD IIIb  - Presents from SNF with confusion and falls, is found to be in a fib with RVR, and found to have SCr of 2.04, up from 1.49 a wk earlier  - He appears volume depleted  - Start IVF hydration, renally-dose medications, check urinalysis and urine chemistries, consider RUS     2. Atrial fibrillation with RVR  - Presents with falls and found to be in atrial fibrillation with RVR  - He was treated with 2.5 mg IV Lopressor in ED for rate control but then converted to SR  - Appears to be new  - He had normal TSH this month, potassium and magnesium are normal today, and he had echo this month with moderate mitral regurg and moderate Lt atrial enlargement  - Continue cardiac monitoring, continue Toprol and consider anticoagulation   3. Delirium; dementia  - Was sent from SNF for AMS and falls; head CT is negative in ED and note the extensive workup during admission earlier this month  - Delirium precautions, continue Namenda, Seroquel qHS    4. Chronic diastolic CHF  - Appears compensated  - Hydrating with IVF as above, monitor volume status    5. CAD - No anginal complaints  - Continue ASA, statin, Toprol   6. Hypertension  - Continue Norvasc and Toprol    7. Pulmonary fibrosis  - Continue pirfenidone    DVT prophylaxis: sq heparin  Code Status: DNR, paperwork at bedside Level of Care: Level of care: Telemetry Family Communication: None available  Disposition Plan:  Patient is from: SNF  Anticipated d/c is to: SNF  Anticipated d/c date is: 8/14 or 12/10/20 Patient currently: pending improvement/stability in renal function, cardiac monitoring/stable HR  Consults called: None  Admission status: Inpatient     Vianne Bulls, MD [facility]  12/07/2020, 6:00 AM

## 2020-12-07 NOTE — Progress Notes (Signed)
Isaac [name] is a 80 y.o. male with medical history significant for CAD, dementia, delirium, chronic kidney disease 3B, and hypertension, now presenting to the emergency department from his SNF for evaluation of confusion and falls.  Patient was discharged from the hospital to SNF on 12/02/2020, reportedly fell 3 times last night, was confused, and complaining of hip pain.  Patient appeared volume depleted and was admitted for AKI on CKD stage IIIb and was also noted to have some atrial fibrillation with RVR that was treated with IV Lopressor in the ED with conversion to NSR.  AKI on CKD stage IIIb -Monitor a.m. labs -Continue IV hydration -Strict I's and O's  Atrial fibrillation with RVR -Currently resolved -IV Lopressor as needed -Plan to hold on anticoagulation given recurrent falls -Monitor on telemetry  Delirium/dementia -Continue Namenda and Seroquel nightly -CT head negative with extensive work-up earlier this month  Chronic diastolic CHF -Appears compensated -Plan to hydrate with IV fluid  CAD -No anginal complaints noted -Continue aspirin, statin, Toprol  Hypertension -Continue Norvasc and Toprol  Pulmonary fibrosis -Continue pirfenidone  Discussed with wife 8/12.  Total care time: 30 minutes.

## 2020-12-07 NOTE — ED Provider Notes (Signed)
[facility] EMERGENCY DEPARTMENT Provider Note   CSN: [id number] Arrival date & time: 12/06/20  2339     History Chief Complaint  Patient presents with   Altered Mental Status    Leg pain, fell three times tonight, "not acting right", left hip pain, "took door off hinges".    Fall    Isaac [name] is a 80 y.o. male.  The history is provided by the nursing home. The history is limited by the condition of the patient (Dementia, altered mental status).  Altered Mental Status Fall He has history of hypertension, hyperlipidemia, coronary artery disease, heart failure, dementia and was transferred here from nursing home because of 3 falls and report of altered mental status.  He was reported to complain of pain in his hip and leg.  Patient is not able to give any history.   Past Medical History:  Diagnosis Date   Allergic rhinitis    Allergy    Arthritis 06/04/2014   Trial of sulindac 05/30/14 since cri risk with other nsaids    Barrett's esophagus 10/27/2011   BPH (benign prostatic hyperplasia) 02/20/2014   CAD (coronary artery disease) of artery bypass graft, Occluded VG-non dominant LCX medical therapy 11/25/2013   CAD (coronary artery disease), native coronary artery - LIMA-LAD, SVG-Diag, SVG-RCA 2002    Catheterization 2002 showing occluded LAD, 99% diagonal, occluded distal circumflex, 90% nondominant right coronary artery  CABG 08/06/00 by Dr. Nils Pyle with LIMA to LAD, SVG to diagonal, SVG to right coronary artery, circumflex was noted to be too small to graft     CAP (community acquired pneumonia) 04/16/2016   See cxr 04/15/16 > treated with 7 days of Levaquin 500 mg daily with resolution radiographically and clinically.   Cataract    Cerumen impaction 06/05/2015   CHF (congestive heart failure), NYHA class III ([place]) 02/20/2014   Dyspnea    with exertion    Dyspnea on exertion 11/08/2013   Followed as Primary Care Patient/ [facility]/ [name]  - new onset early June 2015  -  11/08/2013  Walked RA x 3 laps @ 185 ft each stopped due to  End of study, mild sob, no cp and no desat, EKG ok  - Cardiac w/u inconclusive but not clearly having ischemia  11/24/13   - 09/11/2016  Walked RA x 3 laps @ 185 ft each stopped due to  End of study, nl pace, no sob or desat   - Spirometry 09/11/2016     Essential hypertension    Referred to Int med [facility] 12/12/2016    GERD (gastroesophageal reflux disease)    H/O hiatal hernia    History of skin cancer    bilateral arms with removal   Hyperlipidemia    Hyperlipidemia LDL goal <70    Followed as Primary Care Patient/ [facility]/ [name]     - Target LDL < 70 as has IHD     Hypertension    Hypertensive heart disease    Followed as Primary Care Patient/ [facility]/ [name]      Ischemic heart disease    Left femoral hernia s/p laparoscopic repair 05/28/2017 05/28/2017   Lumbar disc disease    Myocardial infarction ([place]) 2002   Right inguinal hernia s/p laparoscopic repair 05/28/2017 05/28/2017   Stage 3 chronic kidney disease ([place]) 05/22/2015   trial off lasix/ppi/clinoril / [place] and renal eval 05/22/2015 >>>     T wave inversion in EKG 02/20/2014   Unstable angina ([place]) 11/23/2013  Patient Active Problem List   Diagnosis Date Noted   Delirium 11/29/2020   Transient ischemic attack 11/27/2020   Thrombocytopenia ([place]) 11/27/2020   Orthostatic hypotension 11/27/2020   CKD (chronic kidney disease) stage 3, GFR 30-59 ml/min (HCC) 11/27/2020   Hallucination 09/27/2019   Right inguinal hernia s/p laparoscopic repair 05/28/2017 05/28/2017   Left femoral hernia s/p laparoscopic repair 05/28/2017 05/28/2017   Cerumen impaction 06/05/2015   Stage 3 chronic kidney disease ([place]) 05/22/2015   Arthritis 06/04/2014   CHF (congestive heart failure), NYHA class III ([place]) 02/20/2014   BPH (benign prostatic hyperplasia) 02/20/2014   T wave inversion in EKG 02/20/2014   Coronary artery disease 11/25/2013   Essential hypertension     Unstable angina ([place]) 11/23/2013   Dyspnea on exertion 11/08/2013   Barrett's esophagus 10/27/2011   GERD (gastroesophageal reflux disease) 07/14/2011   CAD (coronary artery disease), native coronary artery - LIMA-LAD, SVG-Diag, SVG-RCA 2002    Hyperlipidemia LDL goal <70    Hypertensive heart disease     Past Surgical History:  Procedure Laterality Date   COLONOSCOPY     CORONARY ARTERY BYPASS GRAFT  2002   LIMA-LAD, SVG-Diag, SVG-RCA   EYE SURGERY Bilateral 1995, 2000   ioc for cataracts   GROIN DISSECTION  01/23/2012   Procedure: Virl Son EXPLORATION;  Surgeon: Harl Bowie, MD;  Location: [facility];  Service: General;  Laterality: Left;  Left Inguinal Exploration, release of scar tissue, Placement of Mesh   INGUINAL HERNIA REPAIR Left 1983   INGUINAL HERNIA REPAIR Bilateral 05/28/2017   Procedure: LAPAROSCOPIC RIGHT AND LEFT INGUINAL HERNIA REPAIR WITH MESH;  Surgeon: Michael Boston, MD;  Location: [place];  Service: General;  Laterality: Bilateral;   INSERTION OF MESH Bilateral 05/28/2017   Procedure: INSERTION OF MESH;  Surgeon: Michael Boston, MD;  Location: [place];  Service: General;  Laterality: Bilateral;   LEFT HEART CATHETERIZATION WITH CORONARY ANGIOGRAM N/A 11/24/2013   Procedure: LEFT HEART CATHETERIZATION WITH CORONARY ANGIOGRAM;  Surgeon: Peter M Martinique, MD;  Location: [facility] CATH LAB;  Service: Cardiovascular;  Laterality: N/A;   NOSE SURGERY  1985       Family History  Problem Relation Age of Onset   Breast cancer Mother    Diabetes Mother    Hypertension Father    Dementia Father 22       early 28s   Diabetes Sister    COPD Brother        smoker   Colon cancer Neg Hx    Esophageal cancer Neg Hx    Rectal cancer Neg Hx    Stomach cancer Neg Hx     Social History   Tobacco Use   Smoking status: Never   Smokeless tobacco: Former    Types: Chew    Quit date: 04/28/2014  Vaping Use   Vaping Use: Never used  Substance Use  Topics   Alcohol use: No    Alcohol/week: 0.0 standard drinks   Drug use: No    Home Medications Prior to Admission medications   Medication Sig Start Date End Date Taking? Authorizing Provider  albuterol (VENTOLIN HFA) 108 (90 Base) MCG/ACT inhaler Inhale 2 puffs into the lungs every 6 (six) hours as needed for wheezing or shortness of breath. 02/09/20   Noemi Chapel P, DO  amLODipine (NORVASC) 2.5 MG tablet Take 2.5 mg by mouth daily. 09/13/20   [provider]  aspirin 81 MG EC tablet Take 81 mg by mouth every  morning.    [provider]  Cholecalciferol (VITAMIN D) 2000 UNITS tablet Take 2,000 Units by mouth daily.    [provider]  docusate sodium (COLACE) 100 MG capsule Take 1 capsule by mouth every other day    [provider]  fluticasone (FLONASE) 50 MCG/ACT nasal spray Place 2 sprays into both nostrils daily as needed for allergies or rhinitis.     [provider]  Fluticasone-Salmeterol (ADVAIR DISKUS) 250-50 MCG/DOSE AEPB Inhale 1 puff into the lungs 2 (two) times daily. 11/01/19   Julian Hy, DO  furosemide (LASIX) 40 MG tablet Take 1 tablet (40 mg total) by mouth daily as needed for fluid or edema. 07/06/20   Dettinger, Fransisca Kaufmann, MD  memantine (NAMENDA) 10 MG tablet Take 1 tablet (10 mg total) by mouth 2 (two) times daily. Start after finishing the namenda starter pack first 07/16/20   Garvin Fila, MD  metoprolol succinate (TOPROL-XL) 50 MG 24 hr tablet Take 1 tablet (50 mg total) by mouth daily. 12/01/20   Kathie Dike, MD  Multiple Vitamin (MULTIVITAMIN WITH MINERALS) TABS Take 1 tablet by mouth daily.    [provider]  multivitamin-lutein (OCUVITE-LUTEIN) CAPS capsule Take 1 capsule by mouth daily.    [provider]  nitroGLYCERIN (NITROSTAT) 0.4 MG SL tablet DISSOLVE ONE TABLET UNDER THE TONGUE EVERY 5 MINUTES AS NEEDED FOR CHEST PAIN.  DO NOT EXCEED A TOTAL OF 3 DOSES IN 15 MINUTES 11/05/20   Dettinger,  Fransisca Kaufmann, MD  omeprazole (PRILOSEC) 20 MG capsule Take 1 capsule (20 mg total) by mouth 2 (two) times daily. 02/13/20   Dettinger, Fransisca Kaufmann, MD  ondansetron (ZOFRAN) 4 MG tablet TAKE 1 TABLET BY MOUTH THREE TIMES DAILY AS NEEDED FOR NAUSEA AND VOMITING 08/29/20   Mannam, Praveen, MD  Pirfenidone (ESBRIET) 267 MG TABS TAKE 2 TABLETS (534 MG TOTAL) BY MOUTH IN THE MORNING, AT NOON, AND AT BEDTIME. 09/10/20 09/10/21  Mannam, Hart Robinsons, MD  potassium chloride SA (KLOR-CON M20) 20 MEQ tablet TAKE ONE TABLET BY MOUTH ONCE DAILY WHEN  YOU  TAKE LASIX 08/12/17   Dettinger, Fransisca Kaufmann, MD  QUEtiapine (SEROQUEL) 25 MG tablet Take 3 tablets (75 mg total) by mouth at bedtime. 12/01/20   Kathie Dike, MD  simethicone (MYLICON) 0000000 MG chewable tablet Chew 125 mg by mouth every 6 (six) hours as needed for flatulence.    [provider]  simvastatin (ZOCOR) 20 MG tablet TAKE 1 TABLET BY MOUTH AT BEDTIME 10/26/20   Dettinger, Fransisca Kaufmann, MD  vitamin B-12 (CYANOCOBALAMIN) 1000 MCG tablet Take 1 tablet (1,000 mcg total) by mouth daily. 12/01/20   Kathie Dike, MD    Allergies    Patient has no known allergies.  Review of Systems   Review of Systems  Unable to perform ROS: Dementia   Physical Exam Updated Vital Signs BP 116/61 (BP Location: Left Arm)   Pulse 145   Temp (!) 97.3 F (36.3 C) (Oral)   SpO2 100%   Physical Exam Vitals and nursing note reviewed.  80 year old male, resting comfortably and in no acute distress. Vital signs are significant for rapid heart rate. Oxygen saturation is 100%, which is normal. Head is normocephalic and atraumatic. PERRLA, EOMI. Oropharynx is clear. Neck is nontender and supple without adenopathy or JVD.  There are no carotid bruits. Back is nontender and there is no CVA tenderness. Lungs are clear without rales, wheezes, or rhonchi. Chest is nontender. Heart is tachycardic and  irregular without murmur. Abdomen is soft, flat, nontender without masses or  hepatosplenomegaly and peristalsis is normoactive. Extremities have no cyanosis or edema, full range of motion is present.  No apparent pain with range of motion of either hip. Skin is warm and dry without rash. Neurologic: Awake but nonverbal, cranial nerves are intact.  Moves all extremities equally.  ED Results / Procedures / Treatments   Labs (all labs ordered are listed, but only abnormal results are displayed) Labs Reviewed  COMPREHENSIVE METABOLIC PANEL - Abnormal; Notable for the following components:      Result Value   Glucose, Bld 159 (*)    BUN 32 (*)    Creatinine, Ser 2.04 (*)    Calcium 8.6 (*)    GFR, Estimated 33 (*)    All other components within normal limits  CBC WITH DIFFERENTIAL/PLATELET - Abnormal; Notable for the following components:   WBC 12.1 (*)    RBC 4.02 (*)    Neutro Abs 9.9 (*)    All other components within normal limits  AMMONIA  URINALYSIS, ROUTINE W REFLEX MICROSCOPIC    EKG EKG Interpretation  Date/Time:  Thursday December 06 2020 23:55:46 EDT Ventricular Rate:  143 PR Interval:    QRS Duration: 100 QT Interval:  342 QTC Calculation: 528 R Axis:   15 Text Interpretation: Atrial fibrillation with rapid V-rate LVH with secondary repolarization abnormality ST depression, probably rate related When compared with ECG of 11/26/2020, Atrial fibrillation with rapid ventricular response has replaced Sinus rhythm ST depression is now present - probably rate-related Confirmed by Delora Fuel ([id number]) on 12/07/2020 12:02:49 AM   EKG Interpretation  Date/Time:  Friday December 07 2020 02:14:50 EDT Ventricular Rate:  91 PR Interval:  165 QRS Duration: 96 QT Interval:  339 QTC Calculation: 358 R Axis:   2 Text Interpretation: Sinus rhythm Ventricular bigeminy Nonspecific T abnrm, anterolateral leads When compared with ECG of 12/06/2020, Sinus rhythm has replaced Atrial fibrillation with rapid ventricular response Premature ventricular complexes are now  present REPOLARIZATION ABNORMALITY has improved Confirmed by Delora Fuel [id number]) on 12/07/2020 2:20:10 AM        Radiology DG Chest 1 View  Result Date: 12/07/2020 CLINICAL DATA:  Altered mental status EXAM: CHEST  1 VIEW COMPARISON:  None. FINDINGS: Prior CABG. Scarring in the lung bases. No acute confluent opacities or effusions. Heart is upper limits normal in size. IMPRESSION: Bibasilar scarring.  No active disease. Electronically Signed   By: Rolm Baptise M.D.   On: 12/07/2020 01:25   CT HEAD WO CONTRAST (5MM)  Result Date: 12/07/2020 CLINICAL DATA:  Mental status change, unknown cause.  Fall. EXAM: CT HEAD WITHOUT CONTRAST TECHNIQUE: Contiguous axial images were obtained from the base of the skull through the vertex without intravenous contrast. COMPARISON:  11/26/2020 FINDINGS: Brain: There is atrophy and chronic small vessel disease changes. Small old bilateral lacunar infarcts in the cerebellar hemispheres. No acute intracranial abnormality. Specifically, no hemorrhage, hydrocephalus, mass lesion, acute infarction, or significant intracranial injury. Vascular: No hyperdense vessel or unexpected calcification. Skull: No acute calvarial abnormality. Sinuses/Orbits: No acute findings Other: None IMPRESSION: Atrophy, chronic microvascular disease. No acute intracranial abnormality. 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 Electronically Signed   By: Rolm Baptise M.D.   On: 12/07/2020 01:24   DG HIPS BILAT WITH PELVIS MIN 5 VIEWS  Result Date: 12/07/2020 CLINICAL DATA:  Altered mental status, fall EXAM: DG HIP (WITH OR WITHOUT PELVIS) 5+V BILAT COMPARISON:  None. FINDINGS: Hip joints and SI joints  symmetric and unremarkable. No acute bony abnormality. Specifically, no fracture, subluxation, or dislocation. IMPRESSION: Negative. Electronically Signed   By: Rolm Baptise M.D.   On: 12/07/2020 01:25    Procedures Procedures  CRITICAL CARE Performed by: Delora Fuel Total critical care time:  45 minutes Critical care time was exclusive of separately billable procedures and treating other patients. Critical care was necessary to treat or prevent imminent or life-threatening deterioration. Critical care was time spent personally by me on the following activities: development of treatment plan with patient and/or surrogate as well as nursing, discussions with consultants, evaluation of patient's response to treatment, examination of patient, obtaining history from patient or surrogate, ordering and performing treatments and interventions, ordering and review of laboratory studies, ordering and review of radiographic studies, pulse oximetry and re-evaluation of patient's condition.  Medications Ordered in ED Medications  metoprolol tartrate (LOPRESSOR) injection 2.5 mg (2.5 mg Intravenous Given 12/07/20 0044)    ED Course  I have reviewed the triage vital signs and the nursing notes.  Pertinent labs & imaging results that were available during my care of the patient were reviewed by me and considered in my medical decision making (see chart for details).   MDM Rules/Calculators/A&P                         Several falls at nursing home, altered mental status.  Physical exam is suggestive of atrial fibrillation.  ECG is obtained confirming atrial fibrillation with rapid ventricular response.  He will be sent for CT of head and we will also check x-rays of hips and chest, screening labs will be obtained as well as urinalysis.  Old records are reviewed, and he has a recent hospitalization for transient ischemic attack.  I wonder if paroxysmal atrial fibrillation may have been the underlying cause for his transient ischemic attack.  Because he is unable to give history and it is not clear how long atrial fibrillation has been present, he will not be cardioverted.  We will attempt rate control with metoprolol.  Following a metoprolol, patient actually converted to sinus rhythm.  CT of head showed  no acute process, chest x-ray showed no pneumonia.  Hip x-ray showed no evidence of fractures.  Labs show evidence of acute kidney injury with creatinine on 8/5 was 1.49, creatinine today 2.04.  Ammonia level is normal.  Urinalysis is still pending.  He will need to be admitted for further evaluation of his mental status change.  CHA2DS2-VASc score is noted to be elevated at 5.  He is started on apixaban.  I wonder if his atrial fibrillation may have been a major contributor to his mental status change and acute kidney injury.  Case is discussed with Dr. Olevia Bowens of [facility], who agrees to admit the patient.  CHA2DS2/VAS Stroke Risk Points   His score is 5 (2 points for age, one-point for history of heart failure, one-point for history of hypertension, one-point for history of coronary artery disease)  Final Clinical Impression(s) / ED Diagnoses Final diagnoses:  Fall  Altered mental status, unspecified altered mental status type  Paroxysmal atrial fibrillation ([place])  Acute kidney injury (nontraumatic) ([place])  Fall at nursing home, initial encounter    Rx / DC Orders ED Discharge Orders     None        Delora Fuel, MD [id number] [phone]

## 2020-12-07 NOTE — ED Notes (Deleted)
Male purewick applied.

## 2020-12-07 NOTE — ED Notes (Signed)
Attempted report x1. 

## 2020-12-07 NOTE — Telephone Encounter (Signed)
Patient is currently admitted to the hospital. Next [place] is scheduled for 12/2020

## 2020-12-08 LAB — BASIC METABOLIC PANEL
Anion gap: 7 (ref 5–15)
BUN: 26 mg/dL — ABNORMAL HIGH (ref 8–23)
CO2: 25 mmol/L (ref 22–32)
Calcium: 8.7 mg/dL — ABNORMAL LOW (ref 8.9–10.3)
Chloride: 109 mmol/L (ref 98–111)
Creatinine, Ser: 1.33 mg/dL — ABNORMAL HIGH (ref 0.61–1.24)
GFR, Estimated: 54 mL/min — ABNORMAL LOW (ref >60)
Glucose, Bld: 84 mg/dL (ref 70–99)
Potassium: 3.8 mmol/L (ref 3.5–5.1)
Sodium: 141 mmol/L (ref 135–145)

## 2020-12-08 LAB — MRSA NEXT GEN BY PCR, NASAL: MRSA by PCR Next Gen: NOT DETECTED

## 2020-12-08 LAB — CBC
HCT: 38.1 % — ABNORMAL LOW (ref 39.0–52.0)
Hemoglobin: 12.5 g/dL — ABNORMAL LOW (ref 13.0–17.0)
MCH: 32.5 pg (ref 26.0–34.0)
MCHC: 32.8 g/dL (ref 30.0–36.0)
MCV: 99 fL (ref 80.0–100.0)
Platelets: 171 10*3/uL (ref 150–400)
RBC: 3.85 MIL/uL — ABNORMAL LOW (ref 4.22–5.81)
RDW: 12.8 % (ref 11.5–15.5)
WBC: 8.7 10*3/uL (ref 4.0–10.5)
nRBC: 0 % (ref 0.0–0.2)

## 2020-12-08 LAB — MAGNESIUM: Magnesium: 2.2 mg/dL (ref 1.7–2.4)

## 2020-12-08 LAB — UREA NITROGEN, URINE: Urea Nitrogen, Ur: 714 mg/dL

## 2020-12-08 NOTE — Plan of Care (Signed)

## 2020-12-08 NOTE — Progress Notes (Signed)
PROGRESS NOTE    Isaac [name]  [id number] DOB: Aug 30, 1940 DOA: 12/06/2020 PCP: Dettinger, Fransisca Kaufmann, MD   Brief Narrative:   Isaac [name] is a 80 y.o. male with medical history significant for CAD, dementia, delirium, chronic kidney disease 3B, and hypertension, now presenting to the emergency department from his SNF for evaluation of confusion and falls.  Patient was discharged from the hospital to SNF on 12/02/2020, reportedly fell 3 times last night, was confused, and complaining of hip pain.  Patient appeared volume depleted and was admitted for AKI on CKD stage IIIb and was also noted to have some atrial fibrillation with RVR that was treated with IV Lopressor in the ED with conversion to NSR.  His AKI has now improved, but he is currently encephalopathic from use of Ativan in the ED.  Assessment & Plan:   Principal Problem:   Acute renal failure superimposed on stage 3b chronic kidney disease (HCC) Active Problems:   CAD (coronary artery disease), native coronary artery - LIMA-LAD, SVG-Diag, SVG-RCA 2002   Essential hypertension   Delirium   Atrial fibrillation with RVR (HCC)   Chronic diastolic CHF (congestive heart failure) (HCC)   AKI on CKD stage IIIb-improved -Monitor a.m. labs -No further need for IV fluid -Strict I's and O's   Atrial fibrillation with RVR -Currently resolved -IV Lopressor as needed -Plan to hold on anticoagulation given recurrent falls -Monitor on telemetry   Delirium/dementia-with ongoing encephalopathic state -Hold further Ativan and likely discharge back to SNF in a.m. once more awake and alert -Continue Namenda and Seroquel nightly -CT head negative with extensive work-up earlier this month   Chronic diastolic CHF -Appears compensated -Plan to hydrate with IV fluid   CAD -No anginal complaints noted -Continue aspirin, statin, Toprol   Hypertension -Continue Norvasc and Toprol   Pulmonary fibrosis -Continue pirfenidone   DVT  prophylaxis: Heparin Code Status: DNR Family Communication: Discussed with wife at bedside 8/13 Disposition Plan:  Status is: Inpatient  Remains inpatient appropriate because:Altered mental status, IV treatments appropriate due to intensity of illness or inability to take PO, and Inpatient level of care appropriate due to severity of illness  Dispo: The patient is from: SNF              Anticipated d/c is to: SNF              Patient currently is not medically stable to d/c.   Difficult to place patient No   Consultants:  None  Procedures:  See below  Antimicrobials:  None   Subjective: Patient seen and evaluated today and is very somnolent and unresponsive to verbal commands and even tactile stimulation.  He will arouse briefly and go back to sleep.  Objective: Vitals:   12/07/20 1630 12/07/20 1737 12/07/20 1754 12/08/20 0956  BP: (!) 145/77 (!) 155/82 (!) 155/82 (!) 151/88  Pulse: 75 74 74 73  Resp: '14 18 18   '$ Temp:  (!) 97.5 F (36.4 C) (!) 97.5 F (36.4 C)   TempSrc:  Oral Oral   SpO2: 97% 99%  100%  Weight:   65.7 kg   Height:   '5\' 10"'$  (1.778 m)     Intake/Output Summary (Last 24 hours) at 12/08/2020 1118 Last data filed at 12/08/2020 0900 Gross per 24 hour  Intake 500 ml  Output --  Net 500 ml   Filed Weights   12/07/20 1754  Weight: 65.7 kg    Examination:  General exam: Appears somnolent  and mostly unresponsive Respiratory system: Clear to auscultation. Respiratory effort normal. Cardiovascular system: S1 & S2 heard, RRR.  Gastrointestinal system: Abdomen is soft Central nervous system: Somnolent Extremities: No edema Skin: No significant lesions noted Psychiatry: Cannot be assessed    Data Reviewed: I have personally reviewed following labs and imaging studies  CBC: Recent Labs  Lab 12/07/20 0030 12/08/20 0619  WBC 12.1* 8.7  NEUTROABS 9.9*  --   HGB 13.3 12.5*  HCT 39.4 38.1*  MCV 98.0 99.0  PLT 185 [id number]   Basic Metabolic  Panel: Recent Labs  Lab 12/07/20 0030 12/08/20 0619  NA 136 141  K 4.1 3.8  CL 104 109  CO2 22 25  GLUCOSE 159* 84  BUN 32* 26*  CREATININE 2.04* 1.33*  CALCIUM 8.6* 8.7*  MG 1.8 2.2   GFR: Estimated Creatinine Clearance: 41.9 mL/min (A) (by C-G formula based on SCr of 1.33 mg/dL (H)). Liver Function Tests: Recent Labs  Lab 12/07/20 0030  AST 22  ALT 15  ALKPHOS 71  BILITOT 0.8  PROT 7.3  ALBUMIN 3.6   No results for input(s): LIPASE, AMYLASE in the last 168 hours. Recent Labs  Lab 12/07/20 0031  AMMONIA 10   Coagulation Profile: No results for input(s): INR, PROTIME in the last 168 hours. Cardiac Enzymes: No results for input(s): CKTOTAL, CKMB, CKMBINDEX, TROPONINI in the last 168 hours. BNP (last 3 results) Recent Labs    03/26/20 1124  PROBNP 5,675*   HbA1C: No results for input(s): HGBA1C in the last 72 hours. CBG: No results for input(s): GLUCAP in the last 168 hours. Lipid Profile: No results for input(s): CHOL, HDL, LDLCALC, TRIG, CHOLHDL, LDLDIRECT in the last 72 hours. Thyroid Function Tests: No results for input(s): TSH, T4TOTAL, FREET4, T3FREE, THYROIDAB in the last 72 hours. Anemia Panel: No results for input(s): VITAMINB12, FOLATE, FERRITIN, TIBC, IRON, RETICCTPCT in the last 72 hours. Sepsis Labs: No results for input(s): PROCALCITON, LATICACIDVEN in the last 168 hours.  Recent Results (from the past 240 hour(s))  SARS CORONAVIRUS 2 (TAT 6-24 HRS) Nasopharyngeal Nasopharyngeal Swab     Status: None   Collection Time: 12/07/20  6:47 AM   Specimen: Nasopharyngeal Swab  Result Value Ref Range Status   SARS Coronavirus 2 NEGATIVE NEGATIVE Final    Comment: (NOTE) SARS-CoV-2 target nucleic acids are NOT DETECTED.  The SARS-CoV-2 RNA is generally detectable in upper and lower respiratory specimens during the acute phase of infection. Negative results do not preclude SARS-CoV-2 infection, do not rule out co-infections with other pathogens,  and should not be used as the sole basis for treatment or other patient management decisions. Negative results must be combined with clinical observations, patient history, and epidemiological information. The expected result is Negative.  Fact Sheet for Patients: SugarRoll.be  Fact Sheet for Healthcare Providers: https://www.woods-mathews.com/  This test is not yet approved or cleared by the [place] FDA and  has been authorized for detection and/or diagnosis of SARS-CoV-2 by FDA under an Emergency Use Authorization (EUA). This EUA will remain  in effect (meaning this test can be used) for the duration of the COVID-19 declaration under Se ction 564(b)(1) of the Act, 21 U.S.C. section 360bbb-3(b)(1), unless the authorization is terminated or revoked sooner.  Performed at [facility], [place] [address], [place], [place] [address]   MRSA Next Gen by PCR, Nasal     Status: None   Collection Time: 12/08/20  6:34 AM   Specimen: Nasal Mucosa; Nasal Swab  Result Value Ref Range Status   MRSA by PCR Next Gen NOT DETECTED NOT DETECTED Final    Comment: (NOTE) The GeneXpert MRSA Assay (FDA approved for NASAL specimens only), is one component of a comprehensive MRSA colonization surveillance program. It is not intended to diagnose MRSA infection nor to guide or monitor treatment for MRSA infections. Test performance is not FDA approved in patients less than 46 years old. Performed at [facility], [address] [place], [place] [address]          Radiology Studies: DG Chest 1 View  Result Date: 12/07/2020 CLINICAL DATA:  Altered mental status EXAM: CHEST  1 VIEW COMPARISON:  None. FINDINGS: Prior CABG. Scarring in the lung bases. No acute confluent opacities or effusions. Heart is upper limits normal in size. IMPRESSION: Bibasilar scarring.  No active disease. Electronically Signed   By: Rolm Baptise M.D.   On: 12/07/2020 01:25    CT HEAD WO CONTRAST (5MM)  Result Date: 12/07/2020 CLINICAL DATA:  Mental status change, unknown cause.  Fall. EXAM: CT HEAD WITHOUT CONTRAST TECHNIQUE: Contiguous axial images were obtained from the base of the skull through the vertex without intravenous contrast. COMPARISON:  11/26/2020 FINDINGS: Brain: There is atrophy and chronic small vessel disease changes. Small old bilateral lacunar infarcts in the cerebellar hemispheres. No acute intracranial abnormality. Specifically, no hemorrhage, hydrocephalus, mass lesion, acute infarction, or significant intracranial injury. Vascular: No hyperdense vessel or unexpected calcification. Skull: No acute calvarial abnormality. Sinuses/Orbits: No acute findings Other: None IMPRESSION: Atrophy, chronic microvascular disease. No acute intracranial abnormality. 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 Electronically Signed   By: Rolm Baptise M.D.   On: 12/07/2020 01:24   DG HIPS BILAT WITH PELVIS MIN 5 VIEWS  Result Date: 12/07/2020 CLINICAL DATA:  Altered mental status, fall EXAM: DG HIP (WITH OR WITHOUT PELVIS) 5+V BILAT COMPARISON:  None. FINDINGS: Hip joints and SI joints symmetric and unremarkable. No acute bony abnormality. Specifically, no fracture, subluxation, or dislocation. IMPRESSION: Negative. Electronically Signed   By: Rolm Baptise M.D.   On: 12/07/2020 01:25        Scheduled Meds:  amLODipine  2.5 mg Oral Daily   aspirin EC  81 mg Oral Daily   heparin  5,000 Units Subcutaneous Q8H   memantine  10 mg Oral BID   metoprolol succinate  50 mg Oral Daily   mometasone-formoterol  2 puff Inhalation BID   pantoprazole  40 mg Oral Daily   Pirfenidone  534 mg Oral TID   QUEtiapine  75 mg Oral QHS   simvastatin  20 mg Oral QHS   vitamin B-12  1,000 mcg Oral Daily     LOS: 1 day    Time spent: 35 minutes    Jennavie Martinek Darleen Crocker, DO [facility]  If 7PM-7AM, please contact night-coverage [email or web address] 12/08/2020, 11:18 AM

## 2020-12-09 LAB — BASIC METABOLIC PANEL
Anion gap: 10 (ref 5–15)
BUN: 26 mg/dL — ABNORMAL HIGH (ref 8–23)
CO2: 23 mmol/L (ref 22–32)
Calcium: 8.8 mg/dL — ABNORMAL LOW (ref 8.9–10.3)
Chloride: 108 mmol/L (ref 98–111)
Creatinine, Ser: 1.41 mg/dL — ABNORMAL HIGH (ref 0.61–1.24)
GFR, Estimated: 51 mL/min — ABNORMAL LOW (ref >60)
Glucose, Bld: 92 mg/dL (ref 70–99)
Potassium: 3.7 mmol/L (ref 3.5–5.1)
Sodium: 141 mmol/L (ref 135–145)

## 2020-12-09 LAB — CBC
HCT: 39.9 % (ref 39.0–52.0)
Hemoglobin: 13.5 g/dL (ref 13.0–17.0)
MCH: 33.1 pg (ref 26.0–34.0)
MCHC: 33.8 g/dL (ref 30.0–36.0)
MCV: 97.8 fL (ref 80.0–100.0)
Platelets: 197 10*3/uL (ref 150–400)
RBC: 4.08 MIL/uL — ABNORMAL LOW (ref 4.22–5.81)
RDW: 12.6 % (ref 11.5–15.5)
WBC: 8.6 10*3/uL (ref 4.0–10.5)
nRBC: 0 % (ref 0.0–0.2)

## 2020-12-09 LAB — MAGNESIUM: Magnesium: 2.1 mg/dL (ref 1.7–2.4)

## 2020-12-09 MED ORDER — QUETIAPINE FUMARATE 25 MG PO TABS: 75.0000 mg | ORAL_TABLET | Freq: Every day | ORAL | 2 refills | Status: DC

## 2020-12-09 MED ORDER — PANTOPRAZOLE SODIUM 40 MG IV SOLR
40.0000 mg | INTRAVENOUS | Status: DC
  Administered 2020-12-09 – 2020-12-10 (×2): 40 mg via INTRAVENOUS
  Filled 2020-12-09 (×2): qty 40

## 2020-12-09 MED ORDER — HALOPERIDOL LACTATE 5 MG/ML IJ SOLN: 2.0000 mg | Freq: Once | INTRAMUSCULAR | Status: DC | PRN

## 2020-12-09 NOTE — Progress Notes (Signed)
PROGRESS NOTE    Isaac [name]  [id number] DOB: Jan 21, 1941 DOA: 12/06/2020 PCP: Dettinger, Fransisca Kaufmann, MD   Brief Narrative:   Isaac [name] is a 80 y.o. male with medical history significant for CAD, dementia, delirium, chronic kidney disease 3B, and hypertension, now presenting to the emergency department from his SNF for evaluation of confusion and falls.  Patient was discharged from the hospital to SNF on 12/02/2020, reportedly fell 3 times last night, was confused, and complaining of hip pain.  Patient appeared volume depleted and was admitted for AKI on CKD stage IIIb and was also noted to have some atrial fibrillation with RVR that was treated with IV Lopressor in the ED with conversion to NSR.  His AKI has now improved. He is waiting to go home with home health and hospital bed.  Assessment & Plan:   Principal Problem:   Acute renal failure superimposed on stage 3b chronic kidney disease (HCC) Active Problems:   CAD (coronary artery disease), native coronary artery - LIMA-LAD, SVG-Diag, SVG-RCA 2002   Essential hypertension   Delirium   Atrial fibrillation with RVR (HCC)   Chronic diastolic CHF (congestive heart failure) (HCC)   AKI on CKD stage IIIb-resolved -Monitor a.m. labs -No further need for IV fluid -Strict I's and O's   Atrial fibrillation with RVR -Currently resolved -IV Lopressor as needed -Plan to hold on anticoagulation given recurrent falls -Monitor on telemetry   Delirium/dementia -Mentation improved -Hold further Ativan and likely discharge back to SNF in a.m. once more awake and alert -Continue Namenda and Seroquel nightly -CT head negative with extensive work-up earlier this month   Chronic diastolic CHF -Appears compensated -Plan to hydrate with IV fluid   CAD -No anginal complaints noted -Continue aspirin, statin, Toprol   Hypertension -Continue Norvasc and Toprol   Pulmonary fibrosis -Continue pirfenidone     DVT prophylaxis:  Heparin Code Status: DNR Family Communication: Discussed with wife at bedside 8/14 Disposition Plan:  Status is: Inpatient   Remains inpatient appropriate because:Altered mental status, IV treatments appropriate due to intensity of illness or inability to take PO, and Inpatient level of care appropriate due to severity of illness   Dispo: The patient is from: SNF              Anticipated d/c is to: SNF              Patient currently is not medically stable to d/c.              Difficult to place patient No     Consultants:  None   Procedures:  See below   Antimicrobials:  None   Subjective: Patient seen and evaluated today with no new acute complaints or concerns. No acute concerns or events noted overnight. Wife at bedside feeding him breakfast.  She states that she does not want him to go back to [facility] and would rather take him home with home health and hospital bed.  Objective: Vitals:   12/08/20 1921 12/08/20 2203 12/09/20 0556 12/09/20 0802  BP:  121/63 (!) 166/75 (!) 160/75  Pulse:  74 67 68  Resp:  18 18   Temp:  97.9 F (36.6 C) (!) 97.3 F (36.3 C)   TempSrc:   Oral   SpO2: 98% 99% 100% 100%  Weight:      Height:        Intake/Output Summary (Last 24 hours) at 12/09/2020 1232 Last data filed at 12/09/2020 0900 Gross per  24 hour  Intake 720 ml  Output 1000 ml  Net -280 ml   Filed Weights   12/07/20 1754  Weight: 65.7 kg    Examination:  General exam: Appears calm and comfortable  Respiratory system: Clear to auscultation. Respiratory effort normal. Cardiovascular system: S1 & S2 heard, RRR.  Gastrointestinal system: Abdomen is soft Central nervous system: Alert and awake Extremities: No edema Skin: No significant lesions noted Psychiatry: Flat affect.    Data Reviewed: I have personally reviewed following labs and imaging studies  CBC: Recent Labs  Lab 12/07/20 0030 12/08/20 0619 12/09/20 0624  WBC 12.1* 8.7 8.6  NEUTROABS 9.9*  --    --   HGB 13.3 12.5* 13.5  HCT 39.4 38.1* 39.9  MCV 98.0 99.0 97.8  PLT 185 171 [id number]   Basic Metabolic Panel: Recent Labs  Lab 12/07/20 0030 12/08/20 0619 12/09/20 0624  NA 136 141 141  K 4.1 3.8 3.7  CL 104 109 108  CO2 '22 25 23  '$ GLUCOSE 159* 84 92  BUN 32* 26* 26*  CREATININE 2.04* 1.33* 1.41*  CALCIUM 8.6* 8.7* 8.8*  MG 1.8 2.2 2.1   GFR: Estimated Creatinine Clearance: 39.5 mL/min (A) (by C-G formula based on SCr of 1.41 mg/dL (H)). Liver Function Tests: Recent Labs  Lab 12/07/20 0030  AST 22  ALT 15  ALKPHOS 71  BILITOT 0.8  PROT 7.3  ALBUMIN 3.6   No results for input(s): LIPASE, AMYLASE in the last 168 hours. Recent Labs  Lab 12/07/20 0031  AMMONIA 10   Coagulation Profile: No results for input(s): INR, PROTIME in the last 168 hours. Cardiac Enzymes: No results for input(s): CKTOTAL, CKMB, CKMBINDEX, TROPONINI in the last 168 hours. BNP (last 3 results) Recent Labs    03/26/20 1124  PROBNP 5,675*   HbA1C: No results for input(s): HGBA1C in the last 72 hours. CBG: No results for input(s): GLUCAP in the last 168 hours. Lipid Profile: No results for input(s): CHOL, HDL, LDLCALC, TRIG, CHOLHDL, LDLDIRECT in the last 72 hours. Thyroid Function Tests: No results for input(s): TSH, T4TOTAL, FREET4, T3FREE, THYROIDAB in the last 72 hours. Anemia Panel: No results for input(s): VITAMINB12, FOLATE, FERRITIN, TIBC, IRON, RETICCTPCT in the last 72 hours. Sepsis Labs: No results for input(s): PROCALCITON, LATICACIDVEN in the last 168 hours.  Recent Results (from the past 240 hour(s))  SARS CORONAVIRUS 2 (TAT 6-24 HRS) Nasopharyngeal Nasopharyngeal Swab     Status: None   Collection Time: 12/07/20  6:47 AM   Specimen: Nasopharyngeal Swab  Result Value Ref Range Status   SARS Coronavirus 2 NEGATIVE NEGATIVE Final    Comment: (NOTE) SARS-CoV-2 target nucleic acids are NOT DETECTED.  The SARS-CoV-2 RNA is generally detectable in upper and  lower respiratory specimens during the acute phase of infection. Negative results do not preclude SARS-CoV-2 infection, do not rule out co-infections with other pathogens, and should not be used as the sole basis for treatment or other patient management decisions. Negative results must be combined with clinical observations, patient history, and epidemiological information. The expected result is Negative.  Fact Sheet for Patients: SugarRoll.be  Fact Sheet for Healthcare Providers: https://www.woods-mathews.com/  This test is not yet approved or cleared by the [place] FDA and  has been authorized for detection and/or diagnosis of SARS-CoV-2 by FDA under an Emergency Use Authorization (EUA). This EUA will remain  in effect (meaning this test can be used) for the duration of the COVID-19 declaration under Se ction 564(b)(1) of  the Act, 21 U.S.C. section 360bbb-3(b)(1), unless the authorization is terminated or revoked sooner.  Performed at [facility], [place] [address], [place], [place] [address]   MRSA Next Gen by PCR, Nasal     Status: None   Collection Time: 12/08/20  6:34 AM   Specimen: Nasal Mucosa; Nasal Swab  Result Value Ref Range Status   MRSA by PCR Next Gen NOT DETECTED NOT DETECTED Final    Comment: (NOTE) The GeneXpert MRSA Assay (FDA approved for NASAL specimens only), is one component of a comprehensive MRSA colonization surveillance program. It is not intended to diagnose MRSA infection nor to guide or monitor treatment for MRSA infections. Test performance is not FDA approved in patients less than 31 years old. Performed at [facility], [address], [place],  [address]          Radiology Studies: No results found.      Scheduled Meds:  amLODipine  2.5 mg Oral Daily   aspirin EC  81 mg Oral Daily   heparin  5,000 Units Subcutaneous Q8H   memantine  10 mg Oral BID   metoprolol succinate   50 mg Oral Daily   mometasone-formoterol  2 puff Inhalation BID   pantoprazole (PROTONIX) IV  40 mg Intravenous Q24H   Pirfenidone  534 mg Oral TID   QUEtiapine  75 mg Oral QHS   simvastatin  20 mg Oral QHS   vitamin B-12  1,000 mcg Oral Daily     LOS: 2 days    Time spent: 35 minutes    Kenlyn Lose Darleen Crocker, DO [facility]  If 7PM-7AM, please contact night-coverage [email or web address] 12/09/2020, 12:32 PM

## 2020-12-09 NOTE — Discharge Summary (Signed)
Physician Discharge Summary  Isaac [name] [id number] DOB: 06-Jan-1941 DOA: 12/06/2020  PCP: Dettinger, Fransisca Kaufmann, MD  Admit date: 12/06/2020  Discharge date: 12/09/2020  Admitted From:SNF  Disposition:  Home  Recommendations for Outpatient Follow-up:  Follow up with PCP in 1-2 weeks Continue on home medications as prior along with Seroquel  Home Health:Yes with PT, RN, CSW  Equipment/Devices:Hospital bed  Discharge Condition:Stable  CODE STATUS: DNR  Diet recommendation: Heart Healthy  Brief/Interim Summary:  Isaac [name] is a 80 y.o. male with medical history significant for CAD, dementia, delirium, chronic kidney disease 3B, and hypertension, now presenting to the emergency department from his SNF for evaluation of confusion and falls.  Patient was discharged from the hospital to SNF on 12/02/2020, reportedly fell 3 times last night, was confused, and complaining of hip pain.  Patient appeared volume depleted and was admitted for AKI on CKD stage IIIb and was also noted to have some atrial fibrillation with RVR that was treated with IV Lopressor in the ED with conversion to NSR.  His AKI has now improved and he has returned back to his baseline level.  His wife at bedside does not want him to go back to his skilled nursing facility and would like to take him home with home health.  He will require hospital bed as well as home health services which have now been arranged.  No other acute events noted during the course of this admission.  He is stable for discharge.  Discharge Diagnoses:  Principal Problem:   Acute renal failure superimposed on stage 3b chronic kidney disease ([place]) Active Problems:   CAD (coronary artery disease), native coronary artery - LIMA-LAD, SVG-Diag, SVG-RCA 2002   Essential hypertension   Delirium   Atrial fibrillation with RVR (H[facility])   Chronic diastolic CHF (congestive heart failure) ([place])  Principal discharge diagnosis: AKI on CKD IIIb. Dementia with  delirium. Atrial fibrillation with RVR.  Discharge Instructions  Discharge Instructions     Amb referral to [facility]   Complete by: As directed    Diet - low sodium heart healthy   Complete by: As directed    Increase activity slowly   Complete by: As directed       Allergies as of 12/09/2020   No Known Allergies      Medication List     TAKE these medications    albuterol 108 (90 Base) MCG/ACT inhaler Commonly known as: VENTOLIN HFA Inhale 2 puffs into the lungs every 6 (six) hours as needed for wheezing or shortness of breath.   amLODipine 2.5 MG tablet Commonly known as: NORVASC Take 2.5 mg by mouth daily.   aspirin 81 MG EC tablet Take 81 mg by mouth every morning.   docusate sodium 100 MG capsule Commonly known as: COLACE Take 100 mg by mouth daily. Take 1 capsule by mouth every other day   Esbriet 267 MG Tabs Generic drug: Pirfenidone TAKE 2 TABLETS (534 MG TOTAL) BY MOUTH IN THE MORNING, AT NOON, AND AT BEDTIME.   fluticasone 50 MCG/ACT nasal spray Commonly known as: FLONASE Place 2 sprays into both nostrils daily as needed for allergies or rhinitis.   Fluticasone-Salmeterol 250-50 MCG/DOSE Aepb Commonly known as: Advair Diskus Inhale 1 puff into the lungs 2 (two) times daily.   furosemide 40 MG tablet Commonly known as: LASIX Take 1 tablet (40 mg total) by mouth daily as needed for fluid or edema.   memantine 10 MG tablet Commonly known as: Environmental manager  Take 1 tablet (10 mg total) by mouth 2 (two) times daily. Start after finishing the namenda starter pack first   metoprolol succinate 50 MG 24 hr tablet Commonly known as: TOPROL-XL Take 1 tablet (50 mg total) by mouth daily.   multivitamin with minerals Tabs tablet Take 1 tablet by mouth daily.   multivitamin-lutein Caps capsule Take 1 capsule by mouth daily.   nitroGLYCERIN 0.4 MG SL tablet Commonly known as: NITROSTAT DISSOLVE ONE TABLET UNDER THE TONGUE EVERY 5 MINUTES AS NEEDED FOR  CHEST PAIN.  DO NOT EXCEED A TOTAL OF 3 DOSES IN 15 MINUTES   omeprazole 20 MG capsule Commonly known as: PRILOSEC Take 1 capsule (20 mg total) by mouth 2 (two) times daily.   ondansetron 4 MG tablet Commonly known as: ZOFRAN TAKE 1 TABLET BY MOUTH THREE TIMES DAILY AS NEEDED FOR NAUSEA AND VOMITING   potassium chloride SA 20 MEQ tablet Commonly known as: Klor-Con M20 TAKE ONE TABLET BY MOUTH ONCE DAILY WHEN  YOU  TAKE LASIX   QUEtiapine 25 MG tablet Commonly known as: SEROQUEL Take 3 tablets (75 mg total) by mouth at bedtime.   simethicone 125 MG chewable tablet Commonly known as: MYLICON Chew 0000000 mg by mouth every 6 (six) hours as needed for flatulence.   simvastatin 20 MG tablet Commonly known as: ZOCOR TAKE 1 TABLET BY MOUTH AT BEDTIME   vitamin B-12 1000 MCG tablet Commonly known as: CYANOCOBALAMIN Take 1 tablet (1,000 mcg total) by mouth daily.   Vitamin D 50 MCG (2000 UT) tablet Take 2,000 Units by mouth daily.               Durable Medical Equipment  (From admission, onward)           Start     Ordered   12/09/20 1104  For home use only DME Hospital bed  Once       Question Answer Comment  Length of Need Lifetime   Bed type Semi-electric      12/09/20 1104            Follow-up Information     Dettinger, Fransisca Kaufmann, MD. Schedule an appointment as soon as possible for a visit in 2 week(s).   Specialties: Family Medicine, Cardiology Contact information: [place] [address] [phone]                No Known Allergies  Consultations: None   Procedures/Studies: DG Chest 1 View  Result Date: 12/07/2020 CLINICAL DATA:  Altered mental status EXAM: CHEST  1 VIEW COMPARISON:  None. FINDINGS: Prior CABG. Scarring in the lung bases. No acute confluent opacities or effusions. Heart is upper limits normal in size. IMPRESSION: Bibasilar scarring.  No active disease. Electronically Signed   By: Rolm Baptise M.D.   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 Mediastinal contours and pulmonary vascularity normal. Emphysematous and bronchitic changes consistent with COPD. Bibasilar atelectasis with nodular density at LEFT lung base 10 mm diameter, not seen on previous exam but noted on 09/11/2016 study, likely nipple shadow. No acute infiltrate, pleural effusion, or pneumothorax. Bones demineralized with chronic compression deformity at the thoracolumbar junction. IMPRESSION: COPD changes with bibasilar atelectasis versus scarring. Emphysema (ICD10-J43.9).  Electronically Signed   By: Lavonia Dana M.D.   On: 11/15/2020 10:48   CT HEAD WO CONTRAST (5MM)  Result Date: 12/07/2020 CLINICAL DATA:  Mental status change, unknown cause.  Fall. EXAM: CT HEAD WITHOUT CONTRAST TECHNIQUE: Contiguous axial images were obtained from the base of the skull through the vertex without intravenous contrast. COMPARISON:  11/26/2020 FINDINGS: Brain: There is atrophy and chronic small vessel disease changes. Small old bilateral lacunar infarcts in the cerebellar hemispheres. No acute intracranial abnormality. Specifically, no hemorrhage, hydrocephalus, mass lesion, acute infarction, or significant intracranial injury. Vascular: No hyperdense vessel or unexpected calcification. Skull: No acute calvarial abnormality. Sinuses/Orbits: No acute findings Other: None IMPRESSION: Atrophy, chronic microvascular disease. No acute intracranial abnormality. 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 Electronically Signed   By: Rolm Baptise M.D.   On: 12/07/2020 01:24   CT Head Wo Contrast  Result Date: 11/26/2020 CLINICAL DATA:  Mental status change, unknown cause Patient reports dizziness with multiple falls. EXAM: CT  HEAD WITHOUT CONTRAST TECHNIQUE: Contiguous axial images were obtained from the base of the skull through the vertex without intravenous contrast. COMPARISON:  Head CT 02/18/2020 FINDINGS: Brain: No evidence of acute infarction, hemorrhage, hydrocephalus, extra-axial collection or mass lesion/mass effect. Stable brain volume and mild periventricular chronic small vessel ischemia. 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 Vascular: Atherosclerosis of skullbase vasculature without hyperdense vessel or abnormal calcification. Skull: No fracture or focal lesion. Sinuses/Orbits: Paranasal sinuses and mastoid air cells are clear. The visualized orbits are unremarkable. Bilateral cataract resection. Other: None. IMPRESSION: 1. No acute intracranial abnormality. 2. Stable chronic small vessel ischemia. 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 Electronically Signed   By: Keith Rake M.D.   On: 11/26/2020 18:25   MR ANGIO HEAD WO CONTRAST  Result Date: 11/27/2020 CLINICAL DATA:  TIA; episodes of weakness in both legs EXAM: MRI HEAD WITHOUT CONTRAST MRA HEAD WITHOUT CONTRAST MRA NECK WITHOUT AND WITH CONTRAST TECHNIQUE: Multiplanar, multi-echo pulse sequences of the brain and surrounding structures were acquired without intravenous contrast. Angiographic images of the Circle of Willis were acquired using MRA technique without intravenous contrast. Angiographic images of the neck were acquired using MRA technique without and with intravenous contrast. Carotid stenosis measurements (when applicable) are obtained utilizing NASCET criteria, using the distal internal carotid diameter as the denominator. CONTRAST:  53m GADAVIST GADOBUTROL 1 MMOL/ML IV SOLN COMPARISON:  10/19/2019 FINDINGS: MRI HEAD Motion artifact is present. Brain: There is no acute infarction or intracranial hemorrhage. There is no intracranial mass, mass effect, or edema. There is no hydrocephalus or extra-axial fluid collection.  Patchy T2 hyperintensity in the supratentorial white matter is nonspecific but probably reflects mild to moderate chronic microvascular ischemic changes. Prominence of the ventricles and sulci reflects minor generalized parenchymal volume loss. 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 Multiple small chronic cerebellar infarcts. Vascular: Major vessel flow voids at the skull base are preserved. Skull and upper cervical spine: Normal marrow signal is preserved. Sinuses/Orbits: Minor mucosal thickening. Bilateral lens replacements. Other: Sella is unremarkable.  Mastoid air cells are clear. MRA HEAD Intracranial internal carotid arteries are patent. Middle and anterior cerebral arteries are patent. Intracranial vertebral arteries, basilar artery, posterior cerebral arteries are patent. There is no significant stenosis or aneurysm. MRA NECK Great vessel origins are patent. Common, internal, and external carotid arteries are patent. Extracranial codominant vertebral arteries are patent. There is no hemodynamically significant stenosis. Atherosclerotic irregularity  at the left greater than right ICA origins. IMPRESSION: No acute infarction, hemorrhage, or mass. 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 No large vessel occlusion, hemodynamically significant stenosis, or evidence of dissection. Electronically Signed   By: Macy Mis M.D.   On: 11/27/2020 09:17   MR Angiogram Neck W or Wo Contrast  Result Date: 11/27/2020 CLINICAL DATA:  TIA; episodes of weakness in both legs EXAM: MRI HEAD WITHOUT CONTRAST MRA HEAD WITHOUT CONTRAST MRA NECK WITHOUT AND WITH CONTRAST TECHNIQUE: Multiplanar, multi-echo pulse sequences of the brain and surrounding structures were acquired without intravenous contrast. Angiographic images of the Circle of Willis were acquired using MRA technique without intravenous contrast. Angiographic images of  the neck were acquired using MRA technique without and with intravenous contrast. Carotid stenosis measurements (when applicable) are obtained utilizing NASCET criteria, using the distal internal carotid diameter as the denominator. CONTRAST:  34m GADAVIST GADOBUTROL 1 MMOL/ML IV SOLN COMPARISON:  10/19/2019 FINDINGS: MRI HEAD Motion artifact is present. Brain: There is no acute infarction or intracranial hemorrhage. There is no intracranial mass, mass effect, or edema. There is no hydrocephalus or extra-axial fluid collection. Patchy T2 hyperintensity in the supratentorial white matter is nonspecific but probably reflects mild to moderate chronic microvascular ischemic changes. Prominence of the ventricles and sulci reflects minor generalized parenchymal volume loss. 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 Multiple small chronic cerebellar infarcts. Vascular: Major vessel flow voids at the skull base are preserved. Skull and upper cervical spine: Normal marrow signal is preserved. Sinuses/Orbits: Minor mucosal thickening. Bilateral lens replacements. Other: Sella is unremarkable.  Mastoid air cells are clear. MRA HEAD Intracranial internal carotid arteries are patent. Middle and anterior cerebral arteries are patent. Intracranial vertebral arteries, basilar artery, posterior cerebral arteries are patent. There is no significant stenosis or aneurysm. MRA NECK Great vessel origins are patent. Common, internal, and external carotid arteries are patent. Extracranial codominant vertebral arteries are patent. There is no hemodynamically significant stenosis. Atherosclerotic irregularity at the left greater than right ICA origins. IMPRESSION: No acute infarction, hemorrhage, or mass. 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 No large vessel occlusion, hemodynamically significant stenosis, or evidence of dissection. Electronically  Signed   By: PMacy MisM.D.   On: 11/27/2020 09:17   MR BRAIN WO CONTRAST  Result Date: 11/27/2020 CLINICAL DATA:  TIA; episodes of weakness in both legs EXAM: MRI HEAD WITHOUT CONTRAST MRA HEAD WITHOUT CONTRAST MRA NECK WITHOUT AND WITH CONTRAST TECHNIQUE: Multiplanar, multi-echo pulse sequences of the brain and surrounding structures were acquired without intravenous contrast. Angiographic images of the Circle of Willis were acquired using MRA technique without intravenous contrast. Angiographic images of the neck were acquired using MRA technique without and with intravenous contrast. Carotid stenosis measurements (when applicable) are obtained utilizing NASCET criteria, using the distal internal carotid diameter as the denominator. CONTRAST:  732mGADAVIST GADOBUTROL 1 MMOL/ML IV SOLN COMPARISON:  10/19/2019 FINDINGS: MRI HEAD Motion artifact is present. Brain: There is no acute infarction or intracranial hemorrhage. There is no intracranial mass, mass effect, or edema. There is no hydrocephalus or extra-axial fluid collection. Patchy T2 hyperintensity in the supratentorial white matter is nonspecific but probably reflects mild to moderate chronic microvascular ischemic changes. Prominence of the ventricles and sulci reflects minor generalized parenchymal volume loss. 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 Multiple small chronic  cerebellar infarcts. Vascular: Major vessel flow voids at the skull base are preserved. Skull and upper cervical spine: Normal marrow signal is preserved. Sinuses/Orbits: Minor mucosal thickening. Bilateral lens replacements. Other: Sella is unremarkable.  Mastoid air cells are clear. MRA HEAD Intracranial internal carotid arteries are patent. Middle and anterior cerebral arteries are patent. Intracranial vertebral arteries, basilar artery, posterior cerebral arteries are patent. There is no significant  stenosis or aneurysm. MRA NECK Great vessel origins are patent. Common, internal, and external carotid arteries are patent. Extracranial codominant vertebral arteries are patent. There is no hemodynamically significant stenosis. Atherosclerotic irregularity at the left greater than right ICA origins. IMPRESSION: No acute infarction, hemorrhage, or mass. 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 No large vessel occlusion, hemodynamically significant stenosis, or evidence of dissection. Electronically Signed   By: Macy Mis M.D.   On: 11/27/2020 09:17   DG Chest Portable 1 View  Result Date: 11/26/2020 CLINICAL DATA:  Chest pain, near syncope EXAM: PORTABLE CHEST 1 VIEW COMPARISON:  11/14/2020 FINDINGS: Rotated AP portable examination with scarring and/or fibrosis at the bilateral lung bases. No acute appearing airspace opacity. Cardiomegaly status post median sternotomy and CABG. IMPRESSION: Rotated AP portable examination with scarring and/or fibrosis at the bilateral lung bases. No acute appearing airspace opacity. Electronically Signed   By: Eddie Candle M.D.   On: 11/26/2020 18:00   EEG adult  Result Date: 11/27/2020 Lora Havens, MD     11/27/2020 12:21 PM Patient Name: Isaac [name] MRN: [id number] Epilepsy Attending: Lora Havens Referring Physician/Provider: Dr Bernadette Hoit Date: 11/27/2020 Duration: 24.10 mins Patient history: 80yo M with dizziness. EEG to evaluate for seizure Level of alertness: Awake AEDs during EEG study: None Technical aspects: This EEG study was done with scalp electrodes positioned according to the 10-20 International system of electrode placement. Electrical activity was acquired at a sampling rate of '500Hz'$  and reviewed with a high frequency filter of '70Hz'$  and a low frequency filter of '1Hz'$ . EEG data were recorded continuously and digitally stored. Description: The posterior dominant rhythm consists of '6Hz'$  activity of moderate voltage (25-35 uV)  seen predominantly in posterior head regions, symmetric and reactive to eye opening and eye closing. Hyperventilation and photic stimulation were not performed.   ABNORMALITY - Background slow IMPRESSION: This study is suggestive of mild diffuse encephalopathy, nonspecific etiology. No seizures or epileptiform discharges were seen throughout the recording. Lora Havens   ECHOCARDIOGRAM COMPLETE  Result Date: 11/27/2020    ECHOCARDIOGRAM REPORT   Patient Name:   Isaac [name] Date of Exam: 11/27/2020 Medical Rec #:  [id number]     Height:       70.5 in Accession #:    [id number]    Weight:       148.0 lb Date of Birth:  19-Apr-1941     BSA:          1.846 m Patient Age:    10 years      BP:           146/86 mmHg Patient Gender: M             HR:           67 bpm. Exam Location:  [facility] Procedure: 2D Echo, Cardiac Doppler and Color Doppler Indications:    Stroke  History:        Patient has prior history of Echocardiogram examinations, most  recent 09/08/2019. CHF, CAD, Prior CABG, Stroke,                 Signs/Symptoms:Dyspnea and Chest Pain; Risk Factors:Hypertension                 and Dyslipidemia.  Sonographer:    Wenda Low Referring Phys: [id number] OLADAPO ADEFESO IMPRESSIONS  1. Left ventricular ejection fraction, by estimation, is 55 to 60%. The left ventricle has normal function. The left ventricle demonstrates regional wall motion abnormalities (see scoring diagram/findings for description). There is mild left ventricular  hypertrophy. Left ventricular diastolic parameters are consistent with Grade I diastolic dysfunction (impaired relaxation).  2. Right ventricular systolic function is normal. The right ventricular size is normal. There is normal pulmonary artery systolic pressure. The estimated right ventricular systolic pressure is 123456 mmHg.  3. Left atrial size was moderately dilated.  4. The mitral valve is degenerative. Moderate mitral valve regurgitation.  5. The aortic  valve is tricuspid. Aortic valve regurgitation is mild to moderate. Aortic regurgitation PHT measures 552 msec. Aortic valve mean gradient measures 2.0 mmHg.  6. The inferior vena cava is normal in size with greater than 50% respiratory variability, suggesting right atrial pressure of 3 mmHg. Comparison(s): Prior images reviewed side by side. Inferoposterior wall motion abnormalitiy present on prior study. Mitral regurgitation is moderate at this point. FINDINGS  Left Ventricle: Left ventricular ejection fraction, by estimation, is 55 to 60%. The left ventricle has normal function. The left ventricle demonstrates regional wall motion abnormalities. The left ventricular internal cavity size was normal in size. There is mild left ventricular hypertrophy. Left ventricular diastolic parameters are consistent with Grade I diastolic dysfunction (impaired relaxation).  LV Wall Scoring: The basal inferolateral segment, basal anterolateral segment, and basal inferior segment are akinetic. The entire anterior wall, mid and distal lateral wall, entire septum, entire apex, mid and distal inferior wall, and mid anterolateral segment are normal. Right Ventricle: The right ventricular size is normal. No increase in right ventricular wall thickness. Right ventricular systolic function is normal. There is normal pulmonary artery systolic pressure. The tricuspid regurgitant velocity is 2.32 m/s, and  with an assumed right atrial pressure of 3 mmHg, the estimated right ventricular systolic pressure is 123456 mmHg. Left Atrium: Left atrial size was moderately dilated. Right Atrium: Right atrial size was normal in size. Pericardium: There is no evidence of pericardial effusion. Mitral Valve: The mitral valve is degenerative in appearance. There is mild thickening of the mitral valve leaflet(s). Mild mitral annular calcification. Moderate mitral valve regurgitation, with eccentric posteriorly directed jet. MV peak gradient, 3.0 mmHg. The  mean mitral valve gradient is 1.0 mmHg. Tricuspid Valve: The tricuspid valve is grossly normal. Tricuspid valve regurgitation is trivial. Aortic Valve: The aortic valve is tricuspid. There is mild aortic valve annular calcification. Aortic valve regurgitation is mild to moderate. Aortic regurgitation PHT measures 552 msec. Aortic valve mean gradient measures 2.0 mmHg. Aortic valve peak gradient measures 4.4 mmHg. Aortic valve area, by VTI measures 2.92 cm. Pulmonic Valve: The pulmonic valve was grossly normal. Pulmonic valve regurgitation is trivial. Aorta: The aortic root is normal in size and structure. Venous: The inferior vena cava is normal in size with greater than 50% respiratory variability, suggesting right atrial pressure of 3 mmHg. IAS/Shunts: No atrial level shunt detected by color flow Doppler.  LEFT VENTRICLE PLAX 2D LVIDd:         5.18 cm      Diastology LVIDs:  2.86 cm      LV e' medial:    4.64 cm/s LV PW:         1.35 cm      LV E/e' medial:  9.5 LV IVS:        1.09 cm      LV e' lateral:   5.78 cm/s LVOT diam:     2.00 cm      LV E/e' lateral: 7.6 LV SV:         76 LV SV Index:   41 LVOT Area:     3.14 cm  LV Volumes (MOD) LV vol d, MOD A2C: 59.4 ml LV vol d, MOD A4C: 114.0 ml LV vol s, MOD A2C: 41.7 ml LV vol s, MOD A4C: 50.4 ml LV SV MOD A2C:     17.7 ml LV SV MOD A4C:     114.0 ml LV SV MOD BP:      37.1 ml RIGHT VENTRICLE RV Basal diam:  3.39 cm RV Mid diam:    2.85 cm RV S prime:     12.10 cm/s TAPSE (M-mode): 2.2 cm LEFT ATRIUM             Index       RIGHT ATRIUM           Index LA diam:        4.70 cm 2.55 cm/m  RA Area:     19.40 cm LA Vol (A2C):   69.6 ml 37.70 ml/m RA Volume:   48.80 ml  26.43 ml/m LA Vol (A4C):   80.2 ml 43.44 ml/m LA Biplane Vol: 75.7 ml 41.00 ml/m  AORTIC VALVE AV Area (Vmax):    2.62 cm AV Area (Vmean):   2.53 cm AV Area (VTI):     2.92 cm AV Vmax:           105.00 cm/s AV Vmean:          72.100 cm/s AV VTI:            0.259 m AV Peak Grad:       4.4 mmHg AV Mean Grad:      2.0 mmHg LVOT Vmax:         87.70 cm/s LVOT Vmean:        58.000 cm/s LVOT VTI:          0.241 m LVOT/AV VTI ratio: 0.93 AI PHT:            552 msec  AORTA Ao Root diam: 3.20 cm Ao Asc diam:  3.50 cm MITRAL VALVE               TRICUSPID VALVE MV Area (PHT): 3.15 cm    TR Peak grad:   21.5 mmHg MV Area VTI:   4.32 cm    TR Vmax:        232.00 cm/s MV Peak grad:  3.0 mmHg MV Mean grad:  1.0 mmHg    SHUNTS MV Vmax:       0.86 m/s    Systemic VTI:  0.24 m MV Vmean:      39.6 cm/s   Systemic Diam: 2.00 cm MV Decel Time: 241 msec MV E velocity: 44.20 cm/s MV A velocity: 62.70 cm/s MV E/A ratio:  0.70 Rozann Lesches MD Electronically signed by Rozann Lesches MD Signature Date/Time: 11/27/2020/11:28:33 AM    Final    DG HIPS BILAT WITH PELVIS MIN 5 VIEWS  Result Date: 12/07/2020 CLINICAL DATA:  Altered mental status, fall EXAM: DG HIP (WITH OR WITHOUT PELVIS) 5+V BILAT COMPARISON:  None. FINDINGS: Hip joints and SI joints symmetric and unremarkable. No acute bony abnormality. Specifically, no fracture, subluxation, or dislocation. IMPRESSION: Negative. Electronically Signed   By: Rolm Baptise M.D.   On: 12/07/2020 01:25     Discharge Exam: Vitals:   12/09/20 0802 12/09/20 1410  BP: (!) 160/75 123/64  Pulse: 68 (!) 52  Resp:  16  Temp:  97.8 F (36.6 C)  SpO2: 100% 92%   Vitals:   12/08/20 2203 12/09/20 0556 12/09/20 0802 12/09/20 1410  BP: 121/63 (!) 166/75 (!) 160/75 123/64  Pulse: 74 67 68 (!) 52  Resp: '18 18  16  '$ Temp: 97.9 F (36.6 C) (!) 97.3 F (36.3 C)  97.8 F (36.6 C)  TempSrc:  Oral  Oral  SpO2: 99% 100% 100% 92%  Weight:      Height:        General: Pt is alert, awake, not in acute distress, elderly/frail Cardiovascular: RRR, S1/S2 +, no rubs, no gallops Respiratory: CTA bilaterally, no wheezing, no rhonchi Abdominal: Soft, NT, ND, bowel sounds + Extremities: no edema, no cyanosis    The results of significant diagnostics from this  hospitalization (including imaging, microbiology, ancillary and laboratory) are listed below for reference.     Microbiology: Recent Results (from the past 240 hour(s))  SARS CORONAVIRUS 2 (TAT 6-24 HRS) Nasopharyngeal Nasopharyngeal Swab     Status: None   Collection Time: 12/07/20  6:47 AM   Specimen: Nasopharyngeal Swab  Result Value Ref Range Status   SARS Coronavirus 2 NEGATIVE NEGATIVE Final    Comment: (NOTE) SARS-CoV-2 target nucleic acids are NOT DETECTED.  The SARS-CoV-2 RNA is generally detectable in upper and lower respiratory specimens during the acute phase of infection. Negative results do not preclude SARS-CoV-2 infection, do not rule out co-infections with other pathogens, and should not be used as the sole basis for treatment or other patient management decisions. Negative results must be combined with clinical observations, patient history, and epidemiological information. The expected result is Negative.  Fact Sheet for Patients: SugarRoll.be  Fact Sheet for Healthcare Providers: https://www.woods-mathews.com/  This test is not yet approved or cleared by the [place] FDA and  has been authorized for detection and/or diagnosis of SARS-CoV-2 by FDA under an Emergency Use Authorization (EUA). This EUA will remain  in effect (meaning this test can be used) for the duration of the COVID-19 declaration under Se ction 564(b)(1) of the Act, 21 U.S.C. section 360bbb-3(b)(1), unless the authorization is terminated or revoked sooner.  Performed at [facility], [place] [address] [place], [place] [address]   MRSA Next Gen by PCR, Nasal     Status: None   Collection Time: 12/08/20  6:34 AM   Specimen: Nasal Mucosa; Nasal Swab  Result Value Ref Range Status   MRSA by PCR Next Gen NOT DETECTED NOT DETECTED Final    Comment: (NOTE) The GeneXpert MRSA Assay (FDA approved for NASAL specimens only), is one component of a  comprehensive MRSA colonization surveillance program. It is not intended to diagnose MRSA infection nor to guide or monitor treatment for MRSA infections. Test performance is not FDA approved in patients less than 96 years old. Performed at [facility], [address], [place], [place] [address]      Labs: BNP (last 3 results) Recent Labs    09/13/20 1443  BNP [id number]*   Basic Metabolic Panel: Recent  Labs  Lab 12/07/20 0030 12/08/20 0619 12/09/20 0624  NA 136 141 141  K 4.1 3.8 3.7  CL 104 109 108  CO2 '22 25 23  '$ GLUCOSE 159* 84 92  BUN 32* 26* 26*  CREATININE 2.04* 1.33* 1.41*  CALCIUM 8.6* 8.7* 8.8*  MG 1.8 2.2 2.1   Liver Function Tests: Recent Labs  Lab 12/07/20 0030  AST 22  ALT 15  ALKPHOS 71  BILITOT 0.8  PROT 7.3  ALBUMIN 3.6   No results for input(s): LIPASE, AMYLASE in the last 168 hours. Recent Labs  Lab 12/07/20 0031  AMMONIA 10   CBC: Recent Labs  Lab 12/07/20 0030 12/08/20 0619 12/09/20 0624  WBC 12.1* 8.7 8.6  NEUTROABS 9.9*  --   --   HGB 13.3 12.5* 13.5  HCT 39.4 38.1* 39.9  MCV 98.0 99.0 97.8  PLT 185 171 197   Cardiac Enzymes: No results for input(s): CKTOTAL, CKMB, CKMBINDEX, TROPONINI in the last 168 hours. BNP: Invalid input(s): POCBNP CBG: No results for input(s): GLUCAP in the last 168 hours. D-Dimer No results for input(s): DDIMER in the last 72 hours. Hgb A1c No results for input(s): HGBA1C in the last 72 hours. Lipid Profile No results for input(s): CHOL, HDL, LDLCALC, TRIG, CHOLHDL, LDLDIRECT in the last 72 hours. Thyroid function studies No results for input(s): TSH, T4TOTAL, T3FREE, THYROIDAB in the last 72 hours.  Invalid input(s): FREET3 Anemia work up No results for input(s): VITAMINB12, FOLATE, FERRITIN, TIBC, IRON, RETICCTPCT in the last 72 hours. Urinalysis    Component Value Date/Time   COLORURINE YELLOW 12/07/2020 0905   APPEARANCEUR CLEAR 12/07/2020 0905   APPEARANCEUR Clear 04/11/2020 1035    LABSPEC 1.015 12/07/2020 0905   PHURINE 5.0 12/07/2020 0905   GLUCOSEU NEGATIVE 12/07/2020 0905   GLUCOSEU NEGATIVE 09/06/2012 0925   HGBUR NEGATIVE 12/07/2020 0905   BILIRUBINUR NEGATIVE 12/07/2020 0905   BILIRUBINUR Negative 04/11/2020 1035   KETONESUR NEGATIVE 12/07/2020 0905   PROTEINUR NEGATIVE 12/07/2020 0905   UROBILINOGEN 0.2 09/06/2012 0925   NITRITE NEGATIVE 12/07/2020 0905   LEUKOCYTESUR NEGATIVE 12/07/2020 0905   Sepsis Labs Invalid input(s): PROCALCITONIN,  WBC,  LACTICIDVEN Microbiology Recent Results (from the past 240 hour(s))  SARS CORONAVIRUS 2 (TAT 6-24 HRS) Nasopharyngeal Nasopharyngeal Swab     Status: None   Collection Time: 12/07/20  6:47 AM   Specimen: Nasopharyngeal Swab  Result Value Ref Range Status   SARS Coronavirus 2 NEGATIVE NEGATIVE Final    Comment: (NOTE) SARS-CoV-2 target nucleic acids are NOT DETECTED.  The SARS-CoV-2 RNA is generally detectable in upper and lower respiratory specimens during the acute phase of infection. Negative results do not preclude SARS-CoV-2 infection, do not rule out co-infections with other pathogens, and should not be used as the sole basis for treatment or other patient management decisions. Negative results must be combined with clinical observations, patient history, and epidemiological information. The expected result is Negative.  Fact Sheet for Patients: SugarRoll.be  Fact Sheet for Healthcare Providers: https://www.woods-mathews.com/  This test is not yet approved or cleared by the [place] FDA and  has been authorized for detection and/or diagnosis of SARS-CoV-2 by FDA under an Emergency Use Authorization (EUA). This EUA will remain  in effect (meaning this test can be used) for the duration of the COVID-19 declaration under Se ction 564(b)(1) of the Act, 21 U.S.C. section 360bbb-3(b)(1), unless the authorization is terminated or revoked  sooner.  Performed at [facility], [place],  [place] [address]   MRSA Next Gen by PCR, Nasal     Status: None   Collection Time: 12/08/20  6:34 AM   Specimen: Nasal Mucosa; Nasal Swab  Result Value Ref Range Status   MRSA by PCR Next Gen NOT DETECTED NOT DETECTED Final    Comment: (NOTE) The GeneXpert MRSA Assay (FDA approved for NASAL specimens only), is one component of a comprehensive MRSA colonization surveillance program. It is not intended to diagnose MRSA infection nor to guide or monitor treatment for MRSA infections. Test performance is not FDA approved in patients less than 65 years old. Performed at [facility], [address], [place], [place] [address]      Time coordinating discharge: 35 minutes  SIGNED:   Rodena Goldmann, DO [facility] 12/09/2020, 2:31 PM  If 7PM-7AM, please contact night-coverage [email or web address]

## 2020-12-09 NOTE — TOC Progression Note (Signed)
Transition of Care [facility]) - Progression Note    Patient Details  Name: Isaac [name] MRN: [id number] Date of Birth: [birth year]  Transition of [facility]) CM/SW Contact  Natasha Bence, LCSW Phone Number: 12/09/2020, 3:34 PM  Clinical Narrative:    CSW notified of patient's decline to return to [facility]. Patient agreeable to return home with [facility]. Patient's family requested hospital bed. CSW placed order for hospital bed with [employer]. [employer] agreeable to deliver bed. CSW provided correct address  [address] [place]. EMS not able to provide transportation. CSW scheduled transportation for 9:00 am on 08/15. CSW referred patient to Judson Roch with [facility]. Sarah with [facility] agreeable to take patient pending office approval. TOC to follow.        Expected Discharge Plan and Services           Expected Discharge Date: 12/09/20                                     Social Determinants of Health (SDOH) Interventions    Readmission Risk Interventions Readmission Risk Prevention Plan 12/03/2020  Post Dischage Appt Complete  Medication Screening Complete  Transportation Screening Complete  Some recent data might be hidden

## 2020-12-10 LAB — BASIC METABOLIC PANEL
Anion gap: 10 (ref 5–15)
BUN: 27 mg/dL — ABNORMAL HIGH (ref 8–23)
CO2: 23 mmol/L (ref 22–32)
Calcium: 8.9 mg/dL (ref 8.9–10.3)
Chloride: 110 mmol/L (ref 98–111)
Creatinine, Ser: 1.55 mg/dL — ABNORMAL HIGH (ref 0.61–1.24)
GFR, Estimated: 45 mL/min — ABNORMAL LOW (ref >60)
Glucose, Bld: 97 mg/dL (ref 70–99)
Potassium: 3.8 mmol/L (ref 3.5–5.1)
Sodium: 143 mmol/L (ref 135–145)

## 2020-12-10 NOTE — Progress Notes (Signed)
Patient seen and evaluated this morning with wife at bedside.  No acute overnight events noted.  EMS will be taking patient home today and hospital bed will be delivered.  Home health services ordered.  He is in stable condition for discharge.  Please refer to discharge summary dictated 8/14.  Total care time: 10 minutes.

## 2020-12-10 NOTE — Progress Notes (Signed)
Discharge instructions given to Isaac [name], verbalized understanding. Patient transported by [facility] to home address.. No c/o pain or discomfort noted. VSS.

## 2020-12-10 NOTE — Care Management Important Message (Signed)
Important Message  Patient Details  Name: Isaac [name] MRN: [id number] Date of Birth: [birth year]   Medicare Important Message Given:  Yes     [name] 12/10/2020, 1:45 PM

## 2020-12-10 NOTE — Progress Notes (Signed)
Nsg Discharge Note  Admit Date:  12/06/2020 Discharge date: 12/10/2020   [name] to be D/C'd Home per MD order.  AVS completed.  Copy for chart, and copy for patient signed, and dated. Patient/caregiver able to verbalize understanding.  Discharge Medication: Allergies as of 12/10/2020   No Known Allergies      Medication List     TAKE these medications    albuterol 108 (90 Base) MCG/ACT inhaler Commonly known as: VENTOLIN HFA Inhale 2 puffs into the lungs every 6 (six) hours as needed for wheezing or shortness of breath.   amLODipine 2.5 MG tablet Commonly known as: NORVASC Take 2.5 mg by mouth daily.   aspirin 81 MG EC tablet Take 81 mg by mouth every morning.   docusate sodium 100 MG capsule Commonly known as: COLACE Take 100 mg by mouth daily. Take 1 capsule by mouth every other day   Esbriet 267 MG Tabs Generic drug: Pirfenidone TAKE 2 TABLETS (534 MG TOTAL) BY MOUTH IN THE MORNING, AT NOON, AND AT BEDTIME.   fluticasone 50 MCG/ACT nasal spray Commonly known as: FLONASE Place 2 sprays into both nostrils daily as needed for allergies or rhinitis.   Fluticasone-Salmeterol 250-50 MCG/DOSE Aepb Commonly known as: Advair Diskus Inhale 1 puff into the lungs 2 (two) times daily.   furosemide 40 MG tablet Commonly known as: LASIX Take 1 tablet (40 mg total) by mouth daily as needed for fluid or edema.   memantine 10 MG tablet Commonly known as: Namenda Take 1 tablet (10 mg total) by mouth 2 (two) times daily. Start after finishing the namenda starter pack first   metoprolol succinate 50 MG 24 hr tablet Commonly known as: TOPROL-XL Take 1 tablet (50 mg total) by mouth daily.   multivitamin with minerals Tabs tablet Take 1 tablet by mouth daily.   multivitamin-lutein Caps capsule Take 1 capsule by mouth daily.   nitroGLYCERIN 0.4 MG SL tablet Commonly known as: NITROSTAT DISSOLVE ONE TABLET UNDER THE TONGUE EVERY 5 MINUTES AS NEEDED FOR CHEST PAIN.  DO  NOT EXCEED A TOTAL OF 3 DOSES IN 15 MINUTES   omeprazole 20 MG capsule Commonly known as: PRILOSEC Take 1 capsule (20 mg total) by mouth 2 (two) times daily.   ondansetron 4 MG tablet Commonly known as: ZOFRAN TAKE 1 TABLET BY MOUTH THREE TIMES DAILY AS NEEDED FOR NAUSEA AND VOMITING   potassium chloride SA 20 MEQ tablet Commonly known as: Klor-Con M20 TAKE ONE TABLET BY MOUTH ONCE DAILY WHEN  YOU  TAKE LASIX   QUEtiapine 25 MG tablet Commonly known as: SEROQUEL Take 3 tablets (75 mg total) by mouth at bedtime.   simethicone 125 MG chewable tablet Commonly known as: MYLICON Chew 0000000 mg by mouth every 6 (six) hours as needed for flatulence.   simvastatin 20 MG tablet Commonly known as: ZOCOR TAKE 1 TABLET BY MOUTH AT BEDTIME   vitamin B-12 1000 MCG tablet Commonly known as: CYANOCOBALAMIN Take 1 tablet (1,000 mcg total) by mouth daily.   Vitamin D 50 MCG (2000 UT) tablet Take 2,000 Units by mouth daily.               Durable Medical Equipment  (From admission, onward)           Start     Ordered   12/09/20 1104  For home use only DME Hospital bed  Once       Question Answer Comment  Length of Need Lifetime   Bed  type Semi-electric      12/09/20 1104            Discharge Assessment: Vitals:   12/10/20 0723 12/10/20 1255  BP:  124/69  Pulse:  69  Resp:  16  Temp:  98.6 F (37 C)  SpO2: 99% 100%   Skin clean, dry and intact without evidence of skin break down, no evidence of skin tears noted. IV catheter discontinued intact. Site without signs and symptoms of complications - no redness or edema noted at insertion site, patient denies c/o pain - only slight tenderness at site.  Dressing with slight pressure applied.  D/c Instructions-Education: Discharge instructions given to patient/family with verbalized understanding. D/c education completed with patient/family including follow up instructions, medication list, d/c activities limitations if  indicated, with other d/c instructions as indicated by MD - patient able to verbalize understanding, all questions fully answered. Patient instructed to return to ED, call 911, or call MD for any changes in condition.  Patient escorted via [place], and D/C home via private auto.  Dorcas Mcmurray, LPN [id number] PM

## 2020-12-10 NOTE — TOC Progression Note (Addendum)
Transition of Care [facility]) - Progression Note    Patient Details  Name: Isaac [name] MRN: [id number] Date of Birth: 14-Aug-1940  Transition of Care [facility]) CM/SW Contact  Shade Flood, LCSW Phone Number: 12/10/2020, 10:27 AM  Clinical Narrative:     Pt stable for dc per MD. Awaiting transport from EMS. Confirmed with Wells Guiles at EMS that pt is on their list. Wells Guiles states that they do not have any convalescent transport trucks today so pt will have to be picked up in between Emergency calls. No timeframe could be given. Will update pt's RN.  Updated [facility] and they will follow up with pt.  1522: Received return call from [place] at [facility] stating that now they cannot accept pt due to his insurance. Called other [facility] agencies and pt now accepted by [place] at [place]. They will follow up with pt at home.       Expected Discharge Plan and Services           Expected Discharge Date: 12/09/20                                     Social Determinants of Health (SDOH) Interventions    Readmission Risk Interventions Readmission Risk Prevention Plan 12/03/2020  Post Dischage Appt Complete  Medication Screening Complete  Transportation Screening Complete  Some recent data might be hidden

## 2020-12-11 ENCOUNTER — Telehealth: Payer: Self-pay

## 2020-12-11 NOTE — Telephone Encounter (Signed)
Transition Care Management Unsuccessful Follow-up Telephone Call  Date of discharge and from where:  [facility] 12/10/20  Diagnosis: Falls, acute kidney failure   Attempts:  1st Attempt  Reason for unsuccessful TCM follow-up call:  Left voice message on patient cell  Transition Care Management Unsuccessful Follow-up Telephone Call  Date of discharge and from where:  [facility] 12/10/20  Diagnosis:  falls, acutre kidney failure   Attempts:  2nd Attempt  Reason for unsuccessful TCM follow-up call:  Left voice message on pt wife cell  Transition Care Management Unsuccessful Follow-up Telephone Call  Date of discharge and from where:  [facility] 12/10/20  Diagnosis:  Falls, Acute Kidney Failure   Attempts:  3rd Attempt  Reason for unsuccessful TCM follow-up call:  Missing or invalid number x 2 both secondary numbers

## 2020-12-11 NOTE — Telephone Encounter (Signed)
Patient already has appt for tomorrow with Dr Dettinger for face for face to discuss getting a motorized wheelchair - 30 min appt. I added TCM to this visit. Okay to charge TCM as we attempted to reach patient multiple times.

## 2020-12-12 ENCOUNTER — Ambulatory Visit (INDEPENDENT_AMBULATORY_CARE_PROVIDER_SITE_OTHER): Payer: Medicare Other | Admitting: Family Medicine

## 2020-12-12 ENCOUNTER — Other Ambulatory Visit: Payer: Self-pay

## 2020-12-12 ENCOUNTER — Encounter: Payer: Self-pay | Admitting: Family Medicine

## 2020-12-12 VITALS — BP 145/78 | HR 73 | Ht 70.0 in | Wt 140.0 lb

## 2020-12-12 DIAGNOSIS — I5043 Acute on chronic combined systolic (congestive) and diastolic (congestive) heart failure: Secondary | ICD-10-CM | POA: Diagnosis not present

## 2020-12-12 DIAGNOSIS — Z23 Encounter for immunization: Secondary | ICD-10-CM | POA: Diagnosis not present

## 2020-12-12 DIAGNOSIS — R627 Adult failure to thrive: Secondary | ICD-10-CM | POA: Diagnosis not present

## 2020-12-12 NOTE — Progress Notes (Signed)
BP (!) 145/78   Pulse 73   Ht '5\' 10"'$  (1.778 m)   Wt 140 lb (63.5 kg)   SpO2 (!) 87%   BMI 20.09 kg/m    Subjective:   Patient ID: Isaac [name], male    DOB: January 14, 1941, 80 y.o.   MRN: [id number]  HPI: Isaac [name] is a 80 y.o. male presenting on 12/12/2020 for Medical Management of Chronic Issues (Needs Rx for scooter)   HPI Patient is coming in today because he is having more difficulty walking and they want to try get a power scooter.  He has recalls ended up in the hospital and then he tried to go to nursing home and had some falls there and now is back at home.  At home he has a walker but he is too unstable to use a walker and has fallen some while using his walker.  He continues to get weaker and more off balance.  He also has a manual wheelchair at home and her family here with him whenever he tries to use a manual wheelchair he is too weak to go farther than a couple feet and so it really is not a good mode of transportation for him.  Even while walking in here in our office he is requiring at least 2 people to stabilize him and hold him up as he is walking to prevent him from falling.  He has progressively gotten weaker as he has been losing weight and has been diagnosed with failure to thrive along with his congestive heart failure.  Relevant past medical, surgical, family and social history reviewed and updated as indicated. Interim medical history since our last visit reviewed. Allergies and medications reviewed and updated.  Review of Systems  Constitutional:  Negative for chills and fever.  Respiratory:  Negative for shortness of breath and wheezing.   Cardiovascular:  Negative for chest pain and leg swelling.  Musculoskeletal:  Positive for gait problem and myalgias. Negative for arthralgias and back pain.  Skin:  Negative for rash.  Neurological:  Positive for weakness. Negative for numbness.  All other systems reviewed and are negative.  Per HPI unless specifically  indicated above   Allergies as of 12/12/2020   No Known Allergies      Medication List        Accurate as of December 12, 2020  3:05 PM. If you have any questions, ask your nurse or doctor.          albuterol 108 (90 Base) MCG/ACT inhaler Commonly known as: VENTOLIN HFA Inhale 2 puffs into the lungs every 6 (six) hours as needed for wheezing or shortness of breath.   amLODipine 2.5 MG tablet Commonly known as: NORVASC Take 2.5 mg by mouth daily.   aspirin 81 MG EC tablet Take 81 mg by mouth every morning.   docusate sodium 100 MG capsule Commonly known as: COLACE Take 100 mg by mouth daily. Take 1 capsule by mouth every other day   Esbriet 267 MG Tabs Generic drug: Pirfenidone TAKE 2 TABLETS (534 MG TOTAL) BY MOUTH IN THE MORNING, AT NOON, AND AT BEDTIME.   fluticasone 50 MCG/ACT nasal spray Commonly known as: FLONASE Place 2 sprays into both nostrils daily as needed for allergies or rhinitis.   Fluticasone-Salmeterol 250-50 MCG/DOSE Aepb Commonly known as: Advair Diskus Inhale 1 puff into the lungs 2 (two) times daily.   furosemide 40 MG tablet Commonly known as: LASIX Take 1 tablet (40 mg total)  by mouth daily as needed for fluid or edema.   memantine 10 MG tablet Commonly known as: Namenda Take 1 tablet (10 mg total) by mouth 2 (two) times daily. Start after finishing the namenda starter pack first   metoprolol succinate 50 MG 24 hr tablet Commonly known as: TOPROL-XL Take 1 tablet (50 mg total) by mouth daily.   multivitamin with minerals Tabs tablet Take 1 tablet by mouth daily.   multivitamin-lutein Caps capsule Take 1 capsule by mouth daily.   nitroGLYCERIN 0.4 MG SL tablet Commonly known as: NITROSTAT DISSOLVE ONE TABLET UNDER THE TONGUE EVERY 5 MINUTES AS NEEDED FOR CHEST PAIN.  DO NOT EXCEED A TOTAL OF 3 DOSES IN 15 MINUTES   omeprazole 20 MG capsule Commonly known as: PRILOSEC Take 1 capsule (20 mg total) by mouth 2 (two) times daily.    ondansetron 4 MG tablet Commonly known as: ZOFRAN TAKE 1 TABLET BY MOUTH THREE TIMES DAILY AS NEEDED FOR NAUSEA AND VOMITING   potassium chloride SA 20 MEQ tablet Commonly known as: Klor-Con M20 TAKE ONE TABLET BY MOUTH ONCE DAILY WHEN  YOU  TAKE LASIX   QUEtiapine 25 MG tablet Commonly known as: SEROQUEL Take 3 tablets (75 mg total) by mouth at bedtime.   simethicone 125 MG chewable tablet Commonly known as: MYLICON Chew 0000000 mg by mouth every 6 (six) hours as needed for flatulence.   simvastatin 20 MG tablet Commonly known as: ZOCOR TAKE 1 TABLET BY MOUTH AT BEDTIME   vitamin B-12 1000 MCG tablet Commonly known as: CYANOCOBALAMIN Take 1 tablet (1,000 mcg total) by mouth daily.   Vitamin D 50 MCG (2000 UT) tablet Take 2,000 Units by mouth daily.               Durable Medical Equipment  (From admission, onward)           Start     Ordered   12/12/20 0000  For home use only DME Other see comment       Comments: Power scooter Diagnosis failure to thrive and CHF  Question:  Length of Need  Answer:  Lifetime   12/12/20 1504             Objective:   BP (!) 145/78   Pulse 73   Ht '5\' 10"'$  (1.778 m)   Wt 140 lb (63.5 kg)   SpO2 (!) 87%   BMI 20.09 kg/m   Wt Readings from Last 3 Encounters:  12/12/20 140 lb (63.5 kg)  12/07/20 144 lb 13.5 oz (65.7 kg)  11/27/20 144 lb 13.5 oz (65.7 kg)    Physical Exam Vitals and nursing note reviewed.  Constitutional:      General: He is not in acute distress.    Appearance: He is well-developed. He is not diaphoretic.  Eyes:     General: No scleral icterus.    Conjunctiva/sclera: Conjunctivae normal.  Neck:     Thyroid: No thyromegaly.  Cardiovascular:     Rate and Rhythm: Normal rate and regular rhythm.     Heart sounds: Normal heart sounds. No murmur heard. Pulmonary:     Effort: Pulmonary effort is normal. No respiratory distress.     Breath sounds: Normal breath sounds. No wheezing.  Skin:     General: Skin is warm and dry.     Findings: No rash.  Neurological:     Mental Status: He is alert and oriented to person, place, and time.     Motor: Weakness present.  No tremor or atrophy.     Coordination: Coordination normal.     Gait: Gait abnormal (requiring 2 people to support and required wheelchair to transport him out.  Unable to move the wheelchair on his own, required [place] to push the wheelchair.) and tandem walk abnormal.  Psychiatric:        Behavior: Behavior normal.      Assessment & Plan:   Problem List Items Addressed This Visit       Cardiovascular and Mediastinum   CHF (congestive heart failure), NYHA class III ([place])   Relevant Orders   For home use only DME Other see comment   Other Visit Diagnoses     Failure to thrive in adult    -  Primary   Relevant Orders   For home use only DME Other see comment   Need for shingles vaccine       Relevant Orders   Varicella-zoster vaccine IM (Shingrix) (Completed)     Printed written prescription, patient is going to take his double medical and see if he can get a power wheelchair for  Follow up plan: Return if symptoms worsen or fail to improve.  Counseling provided for all of the vaccine components Orders Placed This Encounter  Procedures   For home use only DME Other see comment   Varicella-zoster vaccine IM (Shingrix)    Caryl Pina, MD [facility] 12/12/2020, 3:05 PM

## 2020-12-12 NOTE — Telephone Encounter (Signed)
Transition Care Management Unsuccessful Follow-up Telephone Call  Date of [facility] and from where[facility]  Diagnosis:  Falls, Acute kidney failure   Attempts:  3rd Attempt  Reason for unsuccessful TCM follow-up call:  Unable to reach patient  **However - since we attempted to reach patient several times and he is being seen in within the correct time frame, it is okay to complete a TCM at today's viit.**

## 2020-12-14 ENCOUNTER — Other Ambulatory Visit (HOSPITAL_COMMUNITY): Payer: Self-pay

## 2020-12-19 ENCOUNTER — Other Ambulatory Visit: Payer: Self-pay | Admitting: Critical Care Medicine

## 2020-12-27 ENCOUNTER — Other Ambulatory Visit (HOSPITAL_COMMUNITY): Payer: Self-pay

## 2021-01-03 ENCOUNTER — Ambulatory Visit (INDEPENDENT_AMBULATORY_CARE_PROVIDER_SITE_OTHER): Payer: Medicare Other | Admitting: Pulmonary Disease

## 2021-01-03 ENCOUNTER — Other Ambulatory Visit (HOSPITAL_COMMUNITY): Payer: Self-pay

## 2021-01-03 ENCOUNTER — Other Ambulatory Visit: Payer: Self-pay

## 2021-01-03 ENCOUNTER — Encounter: Payer: Self-pay | Admitting: Pulmonary Disease

## 2021-01-03 VITALS — BP 136/66 | HR 64 | Temp 98.0°F | Ht 70.5 in | Wt 143.0 lb

## 2021-01-03 DIAGNOSIS — Z5181 Encounter for therapeutic drug level monitoring: Secondary | ICD-10-CM

## 2021-01-03 DIAGNOSIS — J849 Interstitial pulmonary disease, unspecified: Secondary | ICD-10-CM | POA: Diagnosis not present

## 2021-01-03 DIAGNOSIS — J84112 Idiopathic pulmonary fibrosis: Secondary | ICD-10-CM | POA: Diagnosis not present

## 2021-01-03 NOTE — Patient Instructions (Signed)
Continue the Esbriet Will order high-resolution CT in 6 months Follow-up in clinic after CT scan.

## 2021-01-03 NOTE — Progress Notes (Signed)
Isaac [name]    [id number]    1940-08-05  Primary Care Physician:Dettinger, Fransisca Kaufmann, MD  Referring Physician: Dettinger, Fransisca Kaufmann, MD [place],  [place] [address]  Chief complaint:  Follow-up for IPF Started Esbriet September 4849  HPI: 80 year old with history of coronary artery disease, CHF, CKD, dementia Diagnosed with COVID-19 infection in January 2020.  Did not require hospitalization and was and was treated with monoclonal antibody as an outpatient.  Complains of chronic dyspnea on exertion which has been worsening over several years Recent work-up shows pulmonary fibrosis and has been referred here for further evaluation  Noted to have positive SSA and evaluated by rheumatology Reviewed note from Dr. Amil Amen, rheumatology dated 07/04/2020 Evaluated for ILD with positive ANA, SSA Although he has dry eyes and mouth does not demonstrate any other features of systemic connective tissue disease Repeat labs 07/04/2020 done which show SSA greater than 8, negative RNA polymerase, Smith, scleroderma, Sjogren's, Jo 1, centromere. No specific treatment recommended at this point.  Continue monitoring.  Started Esbriet on December 28, 2019 but could not tolerate due to GI upset and nausea. 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  No ongoing exposures.  No mold, hot tub, Jacuzzi.  No feather pillows or comforters Smoking history: Never smoker Travel history: No significant travel history Relevant family history: No significant family history of lung disease  Interim history: Continues on Esbriet at lower dose of 2 tablets 3 times daily. He is taking Zofran as needed for nausea States that dyspnea on exertion is unchanged  He had 2 hospitalizations last  month for episodes of dizziness, gait instability and slurred speech.  Work-up including MRI was negative.  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 He continues to have recurrent episodes at home Recently started on Namenda for progressive dementia  Outpatient Encounter Medications as of 01/03/2021  Medication Sig   albuterol (VENTOLIN HFA) 108 (90 Base) MCG/ACT inhaler Inhale 2 puffs into the lungs every 6 (six) hours as needed for wheezing or shortness of breath.   amLODipine (NORVASC) 2.5 MG tablet Take 2.5 mg by mouth daily.   aspirin 81 MG EC tablet Take 81 mg by mouth every morning.   Cholecalciferol (VITAMIN D) 2000 UNITS tablet Take 2,000 Units by mouth daily.   docusate sodium (COLACE) 100 MG capsule Take 100 mg by mouth daily. Take 1 capsule by mouth every other day   fluticasone (FLONASE) 50 MCG/ACT nasal spray Place 2 sprays into both nostrils daily as needed for allergies or rhinitis.    fluticasone-salmeterol (ADVAIR) 250-50 MCG/ACT AEPB INHALE 1 DOSE BY MOUTH TWICE DAILY   furosemide (LASIX) 40 MG tablet Take 1 tablet (40 mg total) by mouth daily as needed for fluid or edema.   memantine (NAMENDA) 10 MG tablet Take 1 tablet (10 mg total) by mouth 2 (two) times daily. Start after finishing the namenda starter pack first   metoprolol succinate (TOPROL-XL) 50 MG 24 hr tablet Take 1 tablet (50 mg total) by mouth daily.   Multiple Vitamin (MULTIVITAMIN WITH MINERALS) TABS Take 1 tablet by mouth daily.   multivitamin-lutein (OCUVITE-LUTEIN) CAPS capsule Take 1 capsule by mouth daily.   nitroGLYCERIN (NITROSTAT) 0.4 MG SL tablet  DISSOLVE ONE TABLET UNDER THE TONGUE EVERY 5 MINUTES AS NEEDED FOR CHEST PAIN.  DO NOT EXCEED A TOTAL OF 3 DOSES IN 15 MINUTES   omeprazole (PRILOSEC) 20 MG capsule Take 1 capsule (20 mg total) by mouth 2 (two) times daily.   ondansetron (ZOFRAN) 4 MG tablet TAKE 1 TABLET BY MOUTH THREE TIMES DAILY AS NEEDED FOR NAUSEA AND VOMITING   Pirfenidone (ESBRIET)  267 MG TABS TAKE 2 TABLETS (534 MG TOTAL) BY MOUTH IN THE MORNING, AT NOON, AND AT BEDTIME.   potassium chloride SA (KLOR-CON M20) 20 MEQ tablet TAKE ONE TABLET BY MOUTH ONCE DAILY WHEN  YOU  TAKE LASIX   simethicone (MYLICON) 0000000 MG chewable tablet Chew 125 mg by mouth every 6 (six) hours as needed for flatulence.   simvastatin (ZOCOR) 20 MG tablet TAKE 1 TABLET BY MOUTH AT BEDTIME   vitamin B-12 (CYANOCOBALAMIN) 1000 MCG tablet Take 1 tablet (1,000 mcg total) by mouth daily.   QUEtiapine (SEROQUEL) 25 MG tablet Take 3 tablets (75 mg total) by mouth at bedtime. (Patient not taking: Reported on 01/03/2021)   No facility-administered encounter medications on file as of 01/03/2021.    Allergies as of 01/03/2021   (No Known Allergies)    Physical Exam: Blood pressure 136/66, pulse 64, temperature 98 F (36.7 C), temperature source Oral, height 5' 10.5" (1.791 m), weight 143 lb (64.9 kg), SpO2 97 %. Gen:      No acute distress HEENT:  EOMI, sclera anicteric Neck:     No masses; no thyromegaly Lungs:    Mild bibasal crackles CV:         Regular rate and rhythm; no murmurs Abd:      + bowel sounds; soft, non-tender; no palpable masses, no distension Ext:    No edema; adequate peripheral perfusion Skin:      Warm and dry; no rash Neuro: alert and oriented x 3 Psych: normal mood and affect   Data Reviewed: Imaging: CT abdomen pelvis 10/16/2011-mild basal atelectasis/reticulation CT abdomen pelvis 04/26/2017-basal fibrosis with honeycombing High-res CT 11/16/2019-progression of pulmonary fibrosis and UIP pattern Chest x-ray 12/07/20-bibasal scarring, no active disease I have reviewed the images personally.  PFTs: Spirometry 09/11/2016 FVC 3.0 [70%], FEV1 2.3 [72%], F/F 75 Mild restriction  01/03/2020 FVC 2.81 [69%], FEV1 2.13 [73%], F/F 76, TLC 4.38 [76%], DLCO 9.23 [45%] Mild restriction, severe diffusion defect  6-minute walk test  01/19/2020 340 m, nadir O2 sat of 94%  Labs: Hepatic  panel 12/07/2020-within normal limits proBNP 11/25/2019-1976  CTD serologies 11/25/2019-ANA 1:1280, cytoplasmic, SSA 179  Cardiac: Echocardiogram 09/08/2019 LVEF 0000000, grade 1 diastolic dysfunction, normal PA systolic pressure.  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 His recent Covid infection may have changed the trajectory of progression as he has increasing dyspnea now. He does have exposure to asbestos but that was remote and he does not have additional asbestos related finding such as pleural disease Work-up notable for elevated ANA and SSA though no evidence of connective tissue disease per rheumatology evaluation  Has not tolerated full dose of Esbriet due to nausea. He seems to be doing better now with Zofran and at reduced dose. Will make  Echocardiogram without pulmonary hypertension though BNP is elevated Continue monitoring.  Recent hepatic panel during hospitalization was normal  He did not desat on exertion today  Follow-up with repeat high-res CT in 6 months  Plan/Recommendations: Continue Esbriet.  Rheumatology referral  Franny Selvage  MD [facility] 01/03/2021, 12:19 PM  CC: Dettinger, Fransisca Kaufmann, MD

## 2021-01-03 NOTE — Telephone Encounter (Signed)
Reached out to patient who doesn't recall ever receiving any paperwork. Will get new forms in the mail. Explained that income documents will be needed for submission.

## 2021-01-08 ENCOUNTER — Encounter: Payer: Self-pay | Admitting: Family Medicine

## 2021-01-08 ENCOUNTER — Other Ambulatory Visit: Payer: Self-pay

## 2021-01-08 ENCOUNTER — Ambulatory Visit (INDEPENDENT_AMBULATORY_CARE_PROVIDER_SITE_OTHER): Payer: Medicare Other | Admitting: Family Medicine

## 2021-01-08 VITALS — BP 134/71 | HR 63 | Ht 70.5 in | Wt 141.0 lb

## 2021-01-08 DIAGNOSIS — I110 Hypertensive heart disease with heart failure: Secondary | ICD-10-CM | POA: Diagnosis not present

## 2021-01-08 DIAGNOSIS — I10 Essential (primary) hypertension: Secondary | ICD-10-CM | POA: Diagnosis not present

## 2021-01-08 DIAGNOSIS — W19XXXA Unspecified fall, initial encounter: Secondary | ICD-10-CM | POA: Diagnosis not present

## 2021-01-08 DIAGNOSIS — I5042 Chronic combined systolic (congestive) and diastolic (congestive) heart failure: Secondary | ICD-10-CM | POA: Diagnosis not present

## 2021-01-08 DIAGNOSIS — I2510 Atherosclerotic heart disease of native coronary artery without angina pectoris: Secondary | ICD-10-CM

## 2021-01-08 DIAGNOSIS — I5043 Acute on chronic combined systolic (congestive) and diastolic (congestive) heart failure: Secondary | ICD-10-CM | POA: Diagnosis not present

## 2021-01-08 DIAGNOSIS — E785 Hyperlipidemia, unspecified: Secondary | ICD-10-CM

## 2021-01-08 DIAGNOSIS — F0390 Unspecified dementia without behavioral disturbance: Secondary | ICD-10-CM

## 2021-01-08 DIAGNOSIS — N1832 Chronic kidney disease, stage 3b: Secondary | ICD-10-CM

## 2021-01-08 NOTE — Patient Instructions (Signed)
[place]. [place]. [phone]

## 2021-01-08 NOTE — Progress Notes (Signed)
BP 134/71   Pulse 63   Ht 5' 10.5" (1.791 m)   Wt 141 lb (64 kg)   SpO2 96%   BMI 19.95 kg/m    Subjective:   Patient ID: Isaac [name], male    DOB: 01-26-1941, 80 y.o.   MRN: [id number]  HPI: Isaac [name] is a 80 y.o. male presenting on 01/08/2021 for Medical Management of Chronic Issues, Hyperlipidemia, Hypertension, and Chronic Kidney Disease (Stage III)   HPI Hypertension Patient is currently on amlodipine and metoprolol, and their blood pressure today is 134/71. Patient denies any lightheadedness or dizziness. Patient denies headaches, blurred vision, chest pains, shortness of breath, or weakness. Denies any side effects from medication and is content with current medication.   Hyperlipidemia and CAD Patient is coming in for recheck of his hyperlipidemia. The patient is currently taking simvastatin. They deny any issues with myalgias or history of liver damage from it. They deny any focal numbness or weakness or chest pain.   CKD stage III Patient is coming in for recheck of CKD.  Patient has dementia and is currently on Namenda and is stable but not improving or worsening.  He has moderate dementia.  Patient has been becoming increasingly less mobile and has had a couple of falls recently.  He does use a cane but sometimes is very unsteady with.  Fall risk  Relevant past medical, surgical, family and social history reviewed and updated as indicated. Interim medical history since our last visit reviewed. Allergies and medications reviewed and updated.  Review of Systems  Constitutional:  Negative for chills and fever.  Respiratory:  Negative for shortness of breath and wheezing.   Cardiovascular:  Negative for chest pain and leg swelling.  Musculoskeletal:  Positive for gait problem. Negative for back pain.  Skin:  Negative for rash.  Neurological:  Positive for weakness.  Psychiatric/Behavioral:  Positive for confusion.   All other systems reviewed and are  negative.  Per HPI unless specifically indicated above   Allergies as of 01/08/2021   No Known Allergies      Medication List        Accurate as of January 08, 2021  4:31 PM. If you have any questions, ask your nurse or doctor.          albuterol 108 (90 Base) MCG/ACT inhaler Commonly known as: VENTOLIN HFA Inhale 2 puffs into the lungs every 6 (six) hours as needed for wheezing or shortness of breath.   amLODipine 2.5 MG tablet Commonly known as: NORVASC Take 2.5 mg by mouth daily.   aspirin 81 MG EC tablet Take 81 mg by mouth every morning.   docusate sodium 100 MG capsule Commonly known as: COLACE Take 100 mg by mouth daily. Take 1 capsule by mouth every other day   Esbriet 267 MG Tabs Generic drug: Pirfenidone TAKE 2 TABLETS (534 MG TOTAL) BY MOUTH IN THE MORNING, AT NOON, AND AT BEDTIME.   fluticasone 50 MCG/ACT nasal spray Commonly known as: FLONASE Place 2 sprays into both nostrils daily as needed for allergies or rhinitis.   fluticasone-salmeterol 250-50 MCG/ACT Aepb Commonly known as: ADVAIR INHALE 1 DOSE BY MOUTH TWICE DAILY   furosemide 40 MG tablet Commonly known as: LASIX Take 1 tablet (40 mg total) by mouth daily as needed for fluid or edema.   memantine 10 MG tablet Commonly known as: Namenda Take 1 tablet (10 mg total) by mouth 2 (two) times daily. Start after finishing the [employer]  pack first   metoprolol succinate 50 MG 24 hr tablet Commonly known as: TOPROL-XL Take 1 tablet (50 mg total) by mouth daily.   multivitamin with minerals Tabs tablet Take 1 tablet by mouth daily.   multivitamin-lutein Caps capsule Take 1 capsule by mouth daily.   nitroGLYCERIN 0.4 MG SL tablet Commonly known as: NITROSTAT DISSOLVE ONE TABLET UNDER THE TONGUE EVERY 5 MINUTES AS NEEDED FOR CHEST PAIN.  DO NOT EXCEED A TOTAL OF 3 DOSES IN 15 MINUTES   omeprazole 20 MG capsule Commonly known as: PRILOSEC Take 1 capsule (20 mg total) by mouth 2  (two) times daily.   ondansetron 4 MG tablet Commonly known as: ZOFRAN TAKE 1 TABLET BY MOUTH THREE TIMES DAILY AS NEEDED FOR NAUSEA AND VOMITING   potassium chloride SA 20 MEQ tablet Commonly known as: [place]-Con M20 TAKE ONE TABLET BY MOUTH ONCE DAILY WHEN  YOU  TAKE LASIX   QUEtiapine 25 MG tablet Commonly known as: SEROQUEL Take 3 tablets (75 mg total) by mouth at bedtime.   simethicone 125 MG chewable tablet Commonly known as: MYLICON Chew 0000000 mg by mouth every 6 (six) hours as needed for flatulence.   simvastatin 20 MG tablet Commonly known as: ZOCOR TAKE 1 TABLET BY MOUTH AT BEDTIME   vitamin B-12 1000 MCG tablet Commonly known as: CYANOCOBALAMIN Take 1 tablet (1,000 mcg total) by mouth daily.   Vitamin D 50 MCG (2000 UT) tablet Take 2,000 Units by mouth daily.         Objective:   BP 134/71   Pulse 63   Ht 5' 10.5" (1.791 m)   Wt 141 lb (64 kg)   SpO2 96%   BMI 19.95 kg/m   Wt Readings from Last 3 Encounters:  01/08/21 141 lb (64 kg)  01/03/21 143 lb (64.9 kg)  12/12/20 140 lb (63.5 kg)    Physical Exam Vitals and nursing note reviewed.  Constitutional:      General: He is not in acute distress.    Appearance: He is well-developed. He is not diaphoretic.  Eyes:     General: No scleral icterus.    Conjunctiva/sclera: Conjunctivae normal.  Neck:     Thyroid: No thyromegaly.  Cardiovascular:     Rate and Rhythm: Normal rate and regular rhythm.     Heart sounds: Normal heart sounds. No murmur heard. Pulmonary:     Effort: Pulmonary effort is normal. No respiratory distress.     Breath sounds: Normal breath sounds. No wheezing.  Musculoskeletal:        General: No swelling. Normal range of motion.     Cervical back: Neck supple.  Lymphadenopathy:     Cervical: No cervical adenopathy.  Skin:    General: Skin is warm and dry.     Findings: No rash.  Neurological:     Mental Status: He is alert and oriented to person, place, and time.      Coordination: Coordination normal.  Psychiatric:        Behavior: Behavior normal.      Assessment & Plan:   Problem List Items Addressed This Visit       Cardiovascular and Mediastinum   Essential hypertension - Primary   CHF (congestive heart failure), NYHA class III ([place])   Relevant Orders   Ambulatory referral to [place] [facility]   Hypertensive heart disease (Chronic)   Coronary artery disease     Nervous and Auditory   Dementia ([place])   Relevant Orders  Ambulatory referral to [facility]     Genitourinary   Stage 3 chronic kidney disease ([place])     Other   Hyperlipidemia LDL goal <70 (Chronic)   Other Visit Diagnoses     Fall, initial encounter       Relevant Orders   Ambulatory referral to [place]     Will do home health care referral and keep her current medicines.  Gave number for who to call for his scooter  Follow up plan: Return in about 6 months (around 07/08/2021), or if symptoms worsen or fail to improve, for Hypertension and dementia.  Counseling provided for all of the vaccine components Orders Placed This Encounter  Procedures   Ambulatory referral to [place], MD [facility] 01/08/2021, 4:31 PM

## 2021-01-10 DIAGNOSIS — R531 Weakness: Secondary | ICD-10-CM | POA: Diagnosis not present

## 2021-01-10 DIAGNOSIS — N179 Acute kidney failure, unspecified: Secondary | ICD-10-CM | POA: Diagnosis not present

## 2021-01-11 ENCOUNTER — Ambulatory Visit (INDEPENDENT_AMBULATORY_CARE_PROVIDER_SITE_OTHER): Payer: Medicare Other | Admitting: Internal Medicine

## 2021-01-11 ENCOUNTER — Other Ambulatory Visit: Payer: Self-pay

## 2021-01-11 VITALS — BP 132/66 | HR 63 | Ht 70.5 in | Wt 143.2 lb

## 2021-01-11 DIAGNOSIS — I2510 Atherosclerotic heart disease of native coronary artery without angina pectoris: Secondary | ICD-10-CM | POA: Diagnosis not present

## 2021-01-11 DIAGNOSIS — I10 Essential (primary) hypertension: Secondary | ICD-10-CM | POA: Diagnosis not present

## 2021-01-11 NOTE — Progress Notes (Signed)
HPI [name] returns today for followup. He has a h/o dyspnea and CAD, s/p CABG over 15 years ago with preserved LV function. He was found to have pulmonary fibrosis. He has been on medical therapy. He dyspnea remains class 2. He is not using oxygen today. he c/o being weak and he is quite sedentary. He does not exercise at all. He sits in his chair most days. His family helps care for him. He denies chest pain.  No Known Allergies   Current Outpatient Medications  Medication Sig Dispense Refill   albuterol (VENTOLIN HFA) 108 (90 Base) MCG/ACT inhaler Inhale 2 puffs into the lungs every 6 (six) hours as needed for wheezing or shortness of breath. 18 g 3   amLODipine (NORVASC) 2.5 MG tablet Take 2.5 mg by mouth daily.     aspirin 81 MG EC tablet Take 81 mg by mouth every morning.     Cholecalciferol (VITAMIN D) 2000 UNITS tablet Take 2,000 Units by mouth daily.     docusate sodium (COLACE) 100 MG capsule Take 100 mg by mouth daily. Take 1 capsule by mouth every other day     fluticasone (FLONASE) 50 MCG/ACT nasal spray Place 2 sprays into both nostrils daily as needed for allergies or rhinitis.      fluticasone-salmeterol (ADVAIR) 250-50 MCG/ACT AEPB INHALE 1 DOSE BY MOUTH TWICE DAILY 60 each 5   furosemide (LASIX) 40 MG tablet Take 1 tablet (40 mg total) by mouth daily as needed for fluid or edema. 90 tablet 3   memantine (NAMENDA) 10 MG tablet Take 1 tablet (10 mg total) by mouth 2 (two) times daily. Start after finishing the namenda starter pack first 60 tablet 3   metoprolol succinate (TOPROL-XL) 50 MG 24 hr tablet Take 1 tablet (50 mg total) by mouth daily. 135 tablet 3   Multiple Vitamin (MULTIVITAMIN WITH MINERALS) TABS Take 1 tablet by mouth daily.     multivitamin-lutein (OCUVITE-LUTEIN) CAPS capsule Take 1 capsule by mouth daily.     nitroGLYCERIN (NITROSTAT) 0.4 MG SL tablet DISSOLVE ONE TABLET UNDER THE TONGUE EVERY 5 MINUTES AS NEEDED FOR CHEST PAIN.  DO NOT EXCEED A TOTAL  OF 3 DOSES IN 15 MINUTES 25 tablet 1   omeprazole (PRILOSEC) 20 MG capsule Take 1 capsule (20 mg total) by mouth 2 (two) times daily. 180 capsule 3   ondansetron (ZOFRAN) 4 MG tablet TAKE 1 TABLET BY MOUTH THREE TIMES DAILY AS NEEDED FOR NAUSEA AND VOMITING 30 tablet 1   Pirfenidone (ESBRIET) 267 MG TABS TAKE 2 TABLETS (534 MG TOTAL) BY MOUTH IN THE MORNING, AT NOON, AND AT BEDTIME. 180 tablet 5   potassium chloride SA (KLOR-CON M20) 20 MEQ tablet TAKE ONE TABLET BY MOUTH ONCE DAILY WHEN  YOU  TAKE LASIX 30 tablet 5   QUEtiapine (SEROQUEL) 25 MG tablet Take 3 tablets (75 mg total) by mouth at bedtime. 90 tablet 2   simethicone (MYLICON) 0000000 MG chewable tablet Chew 125 mg by mouth every 6 (six) hours as needed for flatulence.     simvastatin (ZOCOR) 20 MG tablet TAKE 1 TABLET BY MOUTH AT BEDTIME 90 tablet 1   vitamin B-12 (CYANOCOBALAMIN) 1000 MCG tablet Take 1 tablet (1,000 mcg total) by mouth daily.     No current facility-administered medications for this visit.     Past Medical History:  Diagnosis Date   Allergic rhinitis    Allergy    Arthritis 06/04/2014   Trial of sulindac  05/30/14 since cri risk with other nsaids    Barrett's esophagus 10/27/2011   BPH (benign prostatic hyperplasia) 02/20/2014   CAD (coronary artery disease) of artery bypass graft, Occluded VG-non dominant LCX medical therapy 11/25/2013   CAD (coronary artery disease), native coronary artery - LIMA-LAD, SVG-Diag, SVG-RCA 2002    Catheterization 2002 showing occluded LAD, 99% diagonal, occluded distal circumflex, 90% nondominant right coronary artery  CABG 08/06/00 by Dr. Nils Pyle with LIMA to LAD, SVG to diagonal, SVG to right coronary artery, circumflex was noted to be too small to graft     CAP (community acquired pneumonia) 04/16/2016   See cxr 04/15/16 > treated with 7 days of Levaquin 500 mg daily with resolution radiographically and clinically.   Cataract    Cerumen impaction 06/05/2015   CHF (congestive heart  failure), NYHA class III ([place]) 02/20/2014   Dyspnea    with exertion    Dyspnea on exertion 11/08/2013   Followed as Primary Care Patient/ [facility]/ [name]  - new onset early June 2015  - 11/08/2013  Walked RA x 3 laps @ 185 ft each stopped due to  End of study, mild sob, no cp and no desat, EKG ok  - Cardiac w/u inconclusive but not clearly having ischemia  11/24/13   - 09/11/2016  Walked RA x 3 laps @ 185 ft each stopped due to  End of study, nl pace, no sob or desat   - Spirometry 09/11/2016     Essential hypertension    Referred to Int med [facility] 12/12/2016    GERD (gastroesophageal reflux disease)    H/O hiatal hernia    History of skin cancer    bilateral arms with removal   Hyperlipidemia    Hyperlipidemia LDL goal <70    Followed as Primary Care Patient/ [facility]/ [name]     - Target LDL < 70 as has IHD     Hypertension    Hypertensive heart disease    Followed as Primary Care Patient/ [facility]/ [name]      Ischemic heart disease    Left femoral hernia s/p laparoscopic repair 05/28/2017 05/28/2017   Lumbar disc disease    Myocardial infarction ([place]) 2002   Right inguinal hernia s/p laparoscopic repair 05/28/2017 05/28/2017   Stage 3 chronic kidney disease ([place]) 05/22/2015   trial off lasix/ppi/clinoril / [place] and renal eval 05/22/2015 >>>     T wave inversion in EKG 02/20/2014   Unstable angina ([place]) 11/23/2013    ROS:   All systems reviewed and negative except as noted in the HPI.   Past Surgical History:  Procedure Laterality Date   COLONOSCOPY     CORONARY ARTERY BYPASS GRAFT  2002   LIMA-LAD, SVG-Diag, SVG-RCA   EYE SURGERY Bilateral 1995, 2000   ioc for cataracts   GROIN DISSECTION  01/23/2012   Procedure: GROIN EXPLORATION;  Surgeon: Harl Bowie, MD;  Location: [facility];  Service: General;  Laterality: Left;  Left Inguinal Exploration, release of scar tissue, Placement of Mesh   INGUINAL HERNIA REPAIR Left 1983   INGUINAL HERNIA REPAIR Bilateral  05/28/2017   Procedure: LAPAROSCOPIC RIGHT AND LEFT INGUINAL HERNIA REPAIR WITH MESH;  Surgeon: Michael Boston, MD;  Location: [facility];  Service: General;  Laterality: Bilateral;   INSERTION OF MESH Bilateral 05/28/2017   Procedure: INSERTION OF MESH;  Surgeon: Michael Boston, MD;  Location: [facility];  Service: General;  Laterality: Bilateral;   [place]  WITH CORONARY ANGIOGRAM N/A 11/24/2013   Procedure: LEFT HEART CATHETERIZATION WITH CORONARY ANGIOGRAM;  Surgeon: Peter M Martinique, MD;  Location[facility];  Service: Cardiovascular;  Laterality: N/A;   NOSE SURGERY  1985     Family History  Problem Relation Age of Onset   Breast cancer Mother    Diabetes Mother    Hypertension Father    Dementia Father 25       early 33s   Diabetes Sister    COPD Brother        smoker   Colon cancer Neg Hx    Esophageal cancer Neg Hx    Rectal cancer Neg Hx    Stomach cancer Neg Hx      Social History   Socioeconomic History   Marital status: Married    Spouse name: [name]   Number of children: 0   Years of education: Not on file   Highest education level: 11th grade  Occupational History   Occupation: Retired    Fish farm manager: [employer]  Tobacco Use   Smoking status: Never   Smokeless tobacco: Former    Types: Chew    Quit date: 04/28/2014  Vaping Use   Vaping Use: Never used  Substance and Sexual Activity   Alcohol use: No    Alcohol/week: 0.0 standard drinks   Drug use: No   Sexual activity: Never  Other Topics Concern   Not on file  Social History Narrative   Lives with wife   Right Handed   Drinks 1 cup caffeine every once ina while   Social Determinants of Health   Financial Resource Strain: Low Risk    Difficulty of Paying Living Expenses: Not hard at all  Food Insecurity: No Food Insecurity   Worried About Charity fundraiser in the Last Year: Never true   Arboriculturist in the Last Year: Never true   Transportation Needs: No Transportation Needs   Lack of Transportation (Medical): No   Lack of Transportation (Non-Medical): No  Physical Activity: Not on file  Stress: No Stress Concern Present   Feeling of Stress : Not at all  Social Connections: Socially Integrated   Frequency of Communication with Friends and Family: More than three times a week   Frequency of Social Gatherings with Friends and Family: More than three times a week   Attends Religious Services: More than 4 times per year   Active Member of [employer] or Organizations: Yes   Attends Music therapist: More than 4 times per year   Marital Status: Married  Human resources officer Violence: Not At Risk   Fear of Current or Ex-Partner: No   Emotionally Abused: No   Physically Abused: No   Sexually Abused: No     BP 132/66   Pulse 63   Ht 5' 10.5" (1.791 m)   Wt 143 lb 3.2 oz (65 kg)   SpO2 91%   BMI 20.26 kg/m   Physical Exam:  Chronically ill appearing NAD HEENT: Unremarkable Neck:  6 cm JVD, no thyromegally Lymphatics:  No adenopathy Back:  No CVA tenderness Lungs:scattered rales and rhonchi, left greater than right HEART:  Regular rate rhythm, no murmurs, no rubs, no clicks Abd:  soft, positive bowel sounds, no organomegally, no rebound, no guarding Ext:  2 plus pulses, no edema, no cyanosis, no clubbing Skin:  No rashes no nodules Neuro:  CN II through XII intact, motor grossly intact   Assess/Plan:  1. Dyspnea -  his symptoms are stable. He will continue lasix. I asked him to reduce his salt intake. 2. Pulmonary fibrosis - he has tolerated perfinidone. His oxygen level is good and he does not require oxygen.  3. CAD - he is s/p CABG. He denies anginal symptoms.  4. HTN - his bp is controlled. He will continue low dose amlodipine.    Carleene Overlie Abb [place],MD

## 2021-01-11 NOTE — Patient Instructions (Signed)
Medication Instructions:  Your physician recommends that you continue on your current medications as directed. Please refer to the Current Medication list given to you today.  *If you need a refill on your cardiac medications before your next appointment, please call your pharmacy*   Lab Work: None ordered   Testing/Procedures: None ordered   Follow-Up: At [facility], you and your health needs are our priority.  As part of our continuing mission to provide you with exceptional heart care, we have created designated Provider Care Teams.  These Care Teams include your primary Cardiologist (physician) and Advanced Practice Providers (APPs -  Physician Assistants and Nurse Practitioners) who all work together to provide you with the care you need, when you need it.  Your next appointment:   1 year(s)  The format for your next appointment:   In Person  Provider:   Cristopher Peru, MD    Thank you for choosing [facility]!!

## 2021-01-15 ENCOUNTER — Other Ambulatory Visit (HOSPITAL_COMMUNITY): Payer: Self-pay

## 2021-01-16 ENCOUNTER — Telehealth: Payer: Self-pay | Admitting: *Deleted

## 2021-01-16 NOTE — Telephone Encounter (Signed)
VM from patient about his order for power scooter to [place] w/ Dx along with demographics & OV notes sent to providers desk for signature

## 2021-01-17 ENCOUNTER — Telehealth: Payer: Self-pay | Admitting: Family Medicine

## 2021-01-17 NOTE — Telephone Encounter (Signed)
Left message for Isaac [name] to call back and let us know which [facility] they would like for the Scooter script to go to. I had a ok conformation to [place] location in Sept.

## 2021-01-18 NOTE — Telephone Encounter (Signed)
[place] is open. They state that they never received the fax from 12/12/20. Re faxed order to [phone].  [place]  phone number [phone]  [name] made aware.

## 2021-01-18 NOTE — Telephone Encounter (Signed)
[employer] in [place]

## 2021-01-18 NOTE — Telephone Encounter (Signed)
Do you have the paperwork/order for this?

## 2021-01-18 NOTE — Telephone Encounter (Signed)
I does not medically [place] is closed so we had to fax it to the one in [place].  Please contact the one in [place]

## 2021-01-22 NOTE — Telephone Encounter (Signed)
Form faxed to [facility]

## 2021-01-24 ENCOUNTER — Other Ambulatory Visit (HOSPITAL_COMMUNITY): Payer: Self-pay

## 2021-01-24 ENCOUNTER — Telehealth: Payer: Self-pay | Admitting: *Deleted

## 2021-01-24 NOTE — Telephone Encounter (Signed)
Okay I agree, let us know how it is in the future.

## 2021-01-24 NOTE — Telephone Encounter (Signed)
Call w/ Otila Kluver nurse w/ [facility] Seeing pt today. Pt has been stumbling & dizzy today BP while sitting was 114/60, then standing was 72/52 Pt had taken Metoprolol & Cardura today, but not Lasix yet In looking over chart, back at 09/13/20 pt was seen for same symptoms & Dr. Warrick Parisian stopped his Amlodipine and Cardura. Otila Kluver verified that pt had both of these meds still in his medication box. Instructed to again discontinue these per the 09/13/20 visit and Otila Kluver will give [place] an update at her next visit

## 2021-01-28 ENCOUNTER — Other Ambulatory Visit (HOSPITAL_COMMUNITY): Payer: Self-pay

## 2021-01-28 ENCOUNTER — Telehealth: Payer: Self-pay | Admitting: Family Medicine

## 2021-01-28 NOTE — Telephone Encounter (Signed)
Faxed order back to [facility] with Dx code and NPI  Dx. History of falls z91.81

## 2021-01-28 NOTE — Telephone Encounter (Signed)
Chloe called from [facility] stating that patient is there with Rx to get a Scooter but says they need [place] to fax a new Rx because the one they have is missing diag code and providers NPI.  Please fax new order to [phone]

## 2021-01-29 DIAGNOSIS — I130 Hypertensive heart and chronic kidney disease with heart failure and stage 1 through stage 4 chronic kidney disease, or unspecified chronic kidney disease: Secondary | ICD-10-CM | POA: Diagnosis not present

## 2021-01-29 DIAGNOSIS — Z9181 History of falling: Secondary | ICD-10-CM | POA: Diagnosis not present

## 2021-01-29 DIAGNOSIS — Z7982 Long term (current) use of aspirin: Secondary | ICD-10-CM | POA: Diagnosis not present

## 2021-01-29 DIAGNOSIS — I2510 Atherosclerotic heart disease of native coronary artery without angina pectoris: Secondary | ICD-10-CM | POA: Diagnosis not present

## 2021-01-29 DIAGNOSIS — E785 Hyperlipidemia, unspecified: Secondary | ICD-10-CM | POA: Diagnosis not present

## 2021-01-29 DIAGNOSIS — I5043 Acute on chronic combined systolic (congestive) and diastolic (congestive) heart failure: Secondary | ICD-10-CM | POA: Diagnosis not present

## 2021-01-29 DIAGNOSIS — N1832 Chronic kidney disease, stage 3b: Secondary | ICD-10-CM | POA: Diagnosis not present

## 2021-01-30 ENCOUNTER — Other Ambulatory Visit (HOSPITAL_COMMUNITY): Payer: Self-pay

## 2021-01-30 NOTE — Telephone Encounter (Signed)
Faxed PAP application to [place]. Awaiting response.  Phone# [phone]

## 2021-01-31 ENCOUNTER — Other Ambulatory Visit: Payer: Self-pay

## 2021-01-31 ENCOUNTER — Ambulatory Visit (INDEPENDENT_AMBULATORY_CARE_PROVIDER_SITE_OTHER): Payer: Medicare Other

## 2021-01-31 DIAGNOSIS — I2510 Atherosclerotic heart disease of native coronary artery without angina pectoris: Secondary | ICD-10-CM

## 2021-01-31 DIAGNOSIS — F0390 Unspecified dementia without behavioral disturbance: Secondary | ICD-10-CM

## 2021-01-31 DIAGNOSIS — Z9181 History of falling: Secondary | ICD-10-CM

## 2021-01-31 DIAGNOSIS — I130 Hypertensive heart and chronic kidney disease with heart failure and stage 1 through stage 4 chronic kidney disease, or unspecified chronic kidney disease: Secondary | ICD-10-CM | POA: Diagnosis not present

## 2021-01-31 DIAGNOSIS — I5043 Acute on chronic combined systolic (congestive) and diastolic (congestive) heart failure: Secondary | ICD-10-CM

## 2021-01-31 DIAGNOSIS — E785 Hyperlipidemia, unspecified: Secondary | ICD-10-CM

## 2021-01-31 DIAGNOSIS — N1832 Chronic kidney disease, stage 3b: Secondary | ICD-10-CM

## 2021-01-31 DIAGNOSIS — Z7982 Long term (current) use of aspirin: Secondary | ICD-10-CM

## 2021-02-04 DIAGNOSIS — I130 Hypertensive heart and chronic kidney disease with heart failure and stage 1 through stage 4 chronic kidney disease, or unspecified chronic kidney disease: Secondary | ICD-10-CM | POA: Diagnosis not present

## 2021-02-04 DIAGNOSIS — I5043 Acute on chronic combined systolic (congestive) and diastolic (congestive) heart failure: Secondary | ICD-10-CM | POA: Diagnosis not present

## 2021-02-04 DIAGNOSIS — I2510 Atherosclerotic heart disease of native coronary artery without angina pectoris: Secondary | ICD-10-CM | POA: Diagnosis not present

## 2021-02-04 DIAGNOSIS — Z7982 Long term (current) use of aspirin: Secondary | ICD-10-CM | POA: Diagnosis not present

## 2021-02-04 DIAGNOSIS — N1832 Chronic kidney disease, stage 3b: Secondary | ICD-10-CM | POA: Diagnosis not present

## 2021-02-04 DIAGNOSIS — E785 Hyperlipidemia, unspecified: Secondary | ICD-10-CM | POA: Diagnosis not present

## 2021-02-04 DIAGNOSIS — Z9181 History of falling: Secondary | ICD-10-CM | POA: Diagnosis not present

## 2021-02-06 DIAGNOSIS — Z9181 History of falling: Secondary | ICD-10-CM | POA: Diagnosis not present

## 2021-02-06 DIAGNOSIS — I130 Hypertensive heart and chronic kidney disease with heart failure and stage 1 through stage 4 chronic kidney disease, or unspecified chronic kidney disease: Secondary | ICD-10-CM | POA: Diagnosis not present

## 2021-02-06 DIAGNOSIS — Z7982 Long term (current) use of aspirin: Secondary | ICD-10-CM | POA: Diagnosis not present

## 2021-02-06 DIAGNOSIS — N1832 Chronic kidney disease, stage 3b: Secondary | ICD-10-CM | POA: Diagnosis not present

## 2021-02-06 DIAGNOSIS — E785 Hyperlipidemia, unspecified: Secondary | ICD-10-CM | POA: Diagnosis not present

## 2021-02-06 DIAGNOSIS — I2510 Atherosclerotic heart disease of native coronary artery without angina pectoris: Secondary | ICD-10-CM | POA: Diagnosis not present

## 2021-02-06 DIAGNOSIS — I5043 Acute on chronic combined systolic (congestive) and diastolic (congestive) heart failure: Secondary | ICD-10-CM | POA: Diagnosis not present

## 2021-02-06 NOTE — Telephone Encounter (Signed)
Left VM on home phone and spouse's mobile number to review Esbriet approval through [place] PAP.  Knox Saliva, PharmD, MPH, BCPS Clinical Pharmacist (Rheumatology and Pulmonology)

## 2021-02-06 NOTE — Telephone Encounter (Signed)
[facility], application is currently under benefits review

## 2021-02-06 NOTE — Telephone Encounter (Signed)
Received a fax from  [place] regarding an approval for [place] patient assistance from 02/06/21 until further notice.   Phone number: [phone]  Medication will come from [facility]- Phone- [phone]

## 2021-02-07 ENCOUNTER — Telehealth: Payer: Self-pay | Admitting: Pulmonary Disease

## 2021-02-07 ENCOUNTER — Other Ambulatory Visit (HOSPITAL_COMMUNITY): Payer: Self-pay

## 2021-02-07 DIAGNOSIS — N1832 Chronic kidney disease, stage 3b: Secondary | ICD-10-CM | POA: Diagnosis not present

## 2021-02-07 DIAGNOSIS — I2510 Atherosclerotic heart disease of native coronary artery without angina pectoris: Secondary | ICD-10-CM | POA: Diagnosis not present

## 2021-02-07 DIAGNOSIS — I130 Hypertensive heart and chronic kidney disease with heart failure and stage 1 through stage 4 chronic kidney disease, or unspecified chronic kidney disease: Secondary | ICD-10-CM | POA: Diagnosis not present

## 2021-02-07 DIAGNOSIS — I5043 Acute on chronic combined systolic (congestive) and diastolic (congestive) heart failure: Secondary | ICD-10-CM | POA: Diagnosis not present

## 2021-02-07 DIAGNOSIS — Z9181 History of falling: Secondary | ICD-10-CM | POA: Diagnosis not present

## 2021-02-07 DIAGNOSIS — Z7982 Long term (current) use of aspirin: Secondary | ICD-10-CM | POA: Diagnosis not present

## 2021-02-07 DIAGNOSIS — E785 Hyperlipidemia, unspecified: Secondary | ICD-10-CM | POA: Diagnosis not present

## 2021-02-07 NOTE — Telephone Encounter (Signed)
ATC [name] (DPR) regarding Esbriet call.  There are no encounters showing anyone from Pharmacy Team called Patient.  Es[place]riet is received from [facility] outpatient.  Left [facility] contact number if needed, and advised to call office for any further questions or concerns.

## 2021-02-09 DIAGNOSIS — N179 Acute kidney failure, unspecified: Secondary | ICD-10-CM | POA: Diagnosis not present

## 2021-02-09 DIAGNOSIS — R531 Weakness: Secondary | ICD-10-CM | POA: Diagnosis not present

## 2021-02-11 DIAGNOSIS — Z7982 Long term (current) use of aspirin: Secondary | ICD-10-CM | POA: Diagnosis not present

## 2021-02-11 DIAGNOSIS — I130 Hypertensive heart and chronic kidney disease with heart failure and stage 1 through stage 4 chronic kidney disease, or unspecified chronic kidney disease: Secondary | ICD-10-CM | POA: Diagnosis not present

## 2021-02-11 DIAGNOSIS — I5043 Acute on chronic combined systolic (congestive) and diastolic (congestive) heart failure: Secondary | ICD-10-CM | POA: Diagnosis not present

## 2021-02-11 DIAGNOSIS — N1832 Chronic kidney disease, stage 3b: Secondary | ICD-10-CM | POA: Diagnosis not present

## 2021-02-11 DIAGNOSIS — E785 Hyperlipidemia, unspecified: Secondary | ICD-10-CM | POA: Diagnosis not present

## 2021-02-11 DIAGNOSIS — I2510 Atherosclerotic heart disease of native coronary artery without angina pectoris: Secondary | ICD-10-CM | POA: Diagnosis not present

## 2021-02-11 DIAGNOSIS — Z9181 History of falling: Secondary | ICD-10-CM | POA: Diagnosis not present

## 2021-02-13 NOTE — Telephone Encounter (Signed)
Conference called [facility]  pharmacy with patient's wife, [name], to schedule shipment of [place]. Rep requested expedited shipment of medication  Will receive medication on 02/16/21 at home and she is aware that someone must be home to sign for medication  Order # [id number]  Knox Saliva, PharmD, MPH, BCPS Clinical Pharmacist (Rheumatology and Pulmonology)

## 2021-02-13 NOTE — Telephone Encounter (Signed)
Conference called patient's wife, [name], to complete welcome call with [place]. Wife provided with [facility] phone number and [facility] phone number by rep. Advised to call pharmacy today to schedule shipment sooner rather than later. She verbalized understanding  Knox Saliva, PharmD, MPH, BCPS Clinical Pharmacist (Rheumatology and Pulmonology)

## 2021-02-14 DIAGNOSIS — Z9181 History of falling: Secondary | ICD-10-CM | POA: Diagnosis not present

## 2021-02-14 DIAGNOSIS — E785 Hyperlipidemia, unspecified: Secondary | ICD-10-CM | POA: Diagnosis not present

## 2021-02-14 DIAGNOSIS — N1831 Chronic kidney disease, stage 3a: Secondary | ICD-10-CM | POA: Diagnosis not present

## 2021-02-14 DIAGNOSIS — I5043 Acute on chronic combined systolic (congestive) and diastolic (congestive) heart failure: Secondary | ICD-10-CM | POA: Diagnosis not present

## 2021-02-14 DIAGNOSIS — I130 Hypertensive heart and chronic kidney disease with heart failure and stage 1 through stage 4 chronic kidney disease, or unspecified chronic kidney disease: Secondary | ICD-10-CM | POA: Diagnosis not present

## 2021-02-14 DIAGNOSIS — I2510 Atherosclerotic heart disease of native coronary artery without angina pectoris: Secondary | ICD-10-CM | POA: Diagnosis not present

## 2021-02-14 DIAGNOSIS — I5032 Chronic diastolic (congestive) heart failure: Secondary | ICD-10-CM | POA: Diagnosis not present

## 2021-02-14 DIAGNOSIS — I129 Hypertensive chronic kidney disease with stage 1 through stage 4 chronic kidney disease, or unspecified chronic kidney disease: Secondary | ICD-10-CM | POA: Diagnosis not present

## 2021-02-14 DIAGNOSIS — Z7982 Long term (current) use of aspirin: Secondary | ICD-10-CM | POA: Diagnosis not present

## 2021-02-14 DIAGNOSIS — N1832 Chronic kidney disease, stage 3b: Secondary | ICD-10-CM | POA: Diagnosis not present

## 2021-02-18 DIAGNOSIS — H3530 Unspecified macular degeneration: Secondary | ICD-10-CM | POA: Diagnosis not present

## 2021-02-19 ENCOUNTER — Other Ambulatory Visit: Payer: Self-pay

## 2021-02-27 ENCOUNTER — Other Ambulatory Visit: Payer: Self-pay | Admitting: Family Medicine

## 2021-03-05 ENCOUNTER — Emergency Department (HOSPITAL_COMMUNITY): Payer: Medicare Other

## 2021-03-05 ENCOUNTER — Inpatient Hospital Stay (HOSPITAL_COMMUNITY)
Admission: EM | Admit: 2021-03-05 | Discharge: 2021-03-14 | DRG: 193 | Disposition: A | Discharge status: Skilled Nursing Facility | Payer: Medicare Other | Attending: Internal Medicine | Admitting: Internal Medicine

## 2021-03-05 ENCOUNTER — Other Ambulatory Visit: Payer: Self-pay

## 2021-03-05 ENCOUNTER — Encounter (HOSPITAL_COMMUNITY): Payer: Self-pay

## 2021-03-05 DIAGNOSIS — I130 Hypertensive heart and chronic kidney disease with heart failure and stage 1 through stage 4 chronic kidney disease, or unspecified chronic kidney disease: Secondary | ICD-10-CM | POA: Diagnosis present

## 2021-03-05 DIAGNOSIS — M2550 Pain in unspecified joint: Secondary | ICD-10-CM | POA: Diagnosis not present

## 2021-03-05 DIAGNOSIS — N179 Acute kidney failure, unspecified: Secondary | ICD-10-CM | POA: Diagnosis not present

## 2021-03-05 DIAGNOSIS — R5381 Other malaise: Secondary | ICD-10-CM | POA: Diagnosis not present

## 2021-03-05 DIAGNOSIS — Z87891 Personal history of nicotine dependence: Secondary | ICD-10-CM | POA: Diagnosis not present

## 2021-03-05 DIAGNOSIS — Z66 Do not resuscitate: Secondary | ICD-10-CM | POA: Diagnosis present

## 2021-03-05 DIAGNOSIS — Z781 Physical restraint status: Secondary | ICD-10-CM | POA: Diagnosis not present

## 2021-03-05 DIAGNOSIS — F0390 Unspecified dementia without behavioral disturbance: Secondary | ICD-10-CM

## 2021-03-05 DIAGNOSIS — I10 Essential (primary) hypertension: Secondary | ICD-10-CM | POA: Diagnosis not present

## 2021-03-05 DIAGNOSIS — R7989 Other specified abnormal findings of blood chemistry: Secondary | ICD-10-CM

## 2021-03-05 DIAGNOSIS — E876 Hypokalemia: Secondary | ICD-10-CM | POA: Diagnosis not present

## 2021-03-05 DIAGNOSIS — J189 Pneumonia, unspecified organism: Secondary | ICD-10-CM | POA: Diagnosis present

## 2021-03-05 DIAGNOSIS — J8410 Pulmonary fibrosis, unspecified: Secondary | ICD-10-CM

## 2021-03-05 DIAGNOSIS — Z7951 Long term (current) use of inhaled steroids: Secondary | ICD-10-CM

## 2021-03-05 DIAGNOSIS — N1831 Chronic kidney disease, stage 3a: Secondary | ICD-10-CM

## 2021-03-05 DIAGNOSIS — Z7982 Long term (current) use of aspirin: Secondary | ICD-10-CM | POA: Diagnosis not present

## 2021-03-05 DIAGNOSIS — F03918 Unspecified dementia, unspecified severity, with other behavioral disturbance: Secondary | ICD-10-CM | POA: Diagnosis not present

## 2021-03-05 DIAGNOSIS — K219 Gastro-esophageal reflux disease without esophagitis: Secondary | ICD-10-CM | POA: Diagnosis not present

## 2021-03-05 DIAGNOSIS — J811 Chronic pulmonary edema: Secondary | ICD-10-CM | POA: Diagnosis not present

## 2021-03-05 DIAGNOSIS — I248 Other forms of acute ischemic heart disease: Secondary | ICD-10-CM | POA: Diagnosis not present

## 2021-03-05 DIAGNOSIS — I2510 Atherosclerotic heart disease of native coronary artery without angina pectoris: Secondary | ICD-10-CM | POA: Diagnosis present

## 2021-03-05 DIAGNOSIS — E860 Dehydration: Secondary | ICD-10-CM | POA: Diagnosis not present

## 2021-03-05 DIAGNOSIS — Z8249 Family history of ischemic heart disease and other diseases of the circulatory system: Secondary | ICD-10-CM | POA: Diagnosis not present

## 2021-03-05 DIAGNOSIS — R5383 Other fatigue: Secondary | ICD-10-CM | POA: Diagnosis not present

## 2021-03-05 DIAGNOSIS — Z951 Presence of aortocoronary bypass graft: Secondary | ICD-10-CM | POA: Diagnosis not present

## 2021-03-05 DIAGNOSIS — G9341 Metabolic encephalopathy: Secondary | ICD-10-CM

## 2021-03-05 DIAGNOSIS — J309 Allergic rhinitis, unspecified: Secondary | ICD-10-CM | POA: Diagnosis present

## 2021-03-05 DIAGNOSIS — Z20822 Contact with and (suspected) exposure to covid-19: Secondary | ICD-10-CM | POA: Diagnosis not present

## 2021-03-05 DIAGNOSIS — R0602 Shortness of breath: Secondary | ICD-10-CM | POA: Diagnosis not present

## 2021-03-05 DIAGNOSIS — I5032 Chronic diastolic (congestive) heart failure: Secondary | ICD-10-CM | POA: Diagnosis present

## 2021-03-05 DIAGNOSIS — I517 Cardiomegaly: Secondary | ICD-10-CM | POA: Diagnosis not present

## 2021-03-05 DIAGNOSIS — Z7401 Bed confinement status: Secondary | ICD-10-CM | POA: Diagnosis not present

## 2021-03-05 DIAGNOSIS — E785 Hyperlipidemia, unspecified: Secondary | ICD-10-CM | POA: Diagnosis not present

## 2021-03-05 DIAGNOSIS — R778 Other specified abnormalities of plasma proteins: Secondary | ICD-10-CM | POA: Diagnosis not present

## 2021-03-05 DIAGNOSIS — I25119 Atherosclerotic heart disease of native coronary artery with unspecified angina pectoris: Secondary | ICD-10-CM | POA: Diagnosis not present

## 2021-03-05 DIAGNOSIS — F05 Delirium due to known physiological condition: Secondary | ICD-10-CM | POA: Diagnosis present

## 2021-03-05 DIAGNOSIS — J9601 Acute respiratory failure with hypoxia: Secondary | ICD-10-CM | POA: Diagnosis present

## 2021-03-05 DIAGNOSIS — R531 Weakness: Secondary | ICD-10-CM | POA: Diagnosis not present

## 2021-03-05 DIAGNOSIS — Z8673 Personal history of transient ischemic attack (TIA), and cerebral infarction without residual deficits: Secondary | ICD-10-CM | POA: Diagnosis not present

## 2021-03-05 DIAGNOSIS — E8809 Other disorders of plasma-protein metabolism, not elsewhere classified: Secondary | ICD-10-CM

## 2021-03-05 DIAGNOSIS — Z79899 Other long term (current) drug therapy: Secondary | ICD-10-CM

## 2021-03-05 DIAGNOSIS — R790 Abnormal level of blood mineral: Secondary | ICD-10-CM | POA: Diagnosis not present

## 2021-03-05 DIAGNOSIS — J9690 Respiratory failure, unspecified, unspecified whether with hypoxia or hypercapnia: Secondary | ICD-10-CM | POA: Diagnosis not present

## 2021-03-05 DIAGNOSIS — R059 Cough, unspecified: Secondary | ICD-10-CM | POA: Diagnosis not present

## 2021-03-05 DIAGNOSIS — R630 Anorexia: Secondary | ICD-10-CM

## 2021-03-05 DIAGNOSIS — R770 Abnormality of albumin: Secondary | ICD-10-CM | POA: Diagnosis not present

## 2021-03-05 DIAGNOSIS — I480 Paroxysmal atrial fibrillation: Secondary | ICD-10-CM | POA: Diagnosis not present

## 2021-03-05 HISTORY — DX: Unspecified dementia, unspecified severity, without behavioral disturbance, psychotic disturbance, mood disturbance, and anxiety: F03.90

## 2021-03-05 LAB — URINALYSIS, ROUTINE W REFLEX MICROSCOPIC
Bacteria, UA: NONE SEEN
Bilirubin Urine: NEGATIVE
Glucose, UA: NEGATIVE mg/dL
Hgb urine dipstick: NEGATIVE
Ketones, ur: NEGATIVE mg/dL
Leukocytes,Ua: NEGATIVE
Nitrite: NEGATIVE
Protein, ur: 30 mg/dL — AB
Specific Gravity, Urine: 1.024 (ref 1.005–1.030)
pH: 5 (ref 5.0–8.0)

## 2021-03-05 LAB — CBC WITH DIFFERENTIAL/PLATELET
Abs Immature Granulocytes: 0.18 10*3/uL — ABNORMAL HIGH (ref 0.00–0.07)
Basophils Absolute: 0 10*3/uL (ref 0.0–0.1)
Basophils Relative: 0 %
Eosinophils Absolute: 0.1 10*3/uL (ref 0.0–0.5)
Eosinophils Relative: 0 %
HCT: 33.5 % — ABNORMAL LOW (ref 39.0–52.0)
Hemoglobin: 11.1 g/dL — ABNORMAL LOW (ref 13.0–17.0)
Immature Granulocytes: 1 %
Lymphocytes Relative: 7 %
Lymphs Abs: 1 10*3/uL (ref 0.7–4.0)
MCH: 31.5 pg (ref 26.0–34.0)
MCHC: 33.1 g/dL (ref 30.0–36.0)
MCV: 95.2 fL (ref 80.0–100.0)
Monocytes Absolute: 1 10*3/uL (ref 0.1–1.0)
Monocytes Relative: 7 %
Neutro Abs: 11.2 10*3/uL — ABNORMAL HIGH (ref 1.7–7.7)
Neutrophils Relative %: 85 %
Platelets: 219 10*3/uL (ref 150–400)
RBC: 3.52 MIL/uL — ABNORMAL LOW (ref 4.22–5.81)
RDW: 13.5 % (ref 11.5–15.5)
WBC: 13.4 10*3/uL — ABNORMAL HIGH (ref 4.0–10.5)
nRBC: 0 % (ref 0.0–0.2)

## 2021-03-05 LAB — RESP PANEL BY RT-PCR (FLU A&B, COVID) ARPGX2
Influenza A by PCR: NEGATIVE
Influenza B by PCR: NEGATIVE
SARS Coronavirus 2 by RT PCR: NEGATIVE

## 2021-03-05 LAB — COMPREHENSIVE METABOLIC PANEL
ALT: 27 U/L (ref 0–44)
AST: 53 U/L — ABNORMAL HIGH (ref 15–41)
Albumin: 3 g/dL — ABNORMAL LOW (ref 3.5–5.0)
Alkaline Phosphatase: 72 U/L (ref 38–126)
Anion gap: 9 (ref 5–15)
BUN: 32 mg/dL — ABNORMAL HIGH (ref 8–23)
CO2: 27 mmol/L (ref 22–32)
Calcium: 8.8 mg/dL — ABNORMAL LOW (ref 8.9–10.3)
Chloride: 103 mmol/L (ref 98–111)
Creatinine, Ser: 1.48 mg/dL — ABNORMAL HIGH (ref 0.61–1.24)
GFR, Estimated: 48 mL/min — ABNORMAL LOW (ref >60)
Glucose, Bld: 134 mg/dL — ABNORMAL HIGH (ref 70–99)
Potassium: 3.6 mmol/L (ref 3.5–5.1)
Sodium: 139 mmol/L (ref 135–145)
Total Bilirubin: 0.9 mg/dL (ref 0.3–1.2)
Total Protein: 7.3 g/dL (ref 6.5–8.1)

## 2021-03-05 LAB — TROPONIN I (HIGH SENSITIVITY)
Troponin I (High Sensitivity): 410 ng/L (ref <18)
Troponin I (High Sensitivity): 384 ng/L (ref <18)
Troponin I (High Sensitivity): 348 ng/L (ref <18)

## 2021-03-05 LAB — BRAIN NATRIURETIC PEPTIDE: B Natriuretic Peptide: 662.4 pg/mL — ABNORMAL HIGH (ref 0.0–100.0)

## 2021-03-05 MED ORDER — VITAMIN D3 25 MCG (1000 UNIT) PO TABS
2000.0000 [IU] | ORAL_TABLET | Freq: Every day | ORAL | Status: DC
Start: 2021-03-05 — End: 2021-03-14
  Administered 2021-03-05 – 2021-03-14 (×10): 2000 [IU] via ORAL
  Filled 2021-03-05 (×12): qty 2

## 2021-03-05 MED ORDER — DEXTROSE 5 % IV SOLN: 250.0000 mg | INTRAVENOUS | Status: DC

## 2021-03-05 MED ORDER — SIMVASTATIN 20 MG PO TABS
20.0000 mg | ORAL_TABLET | Freq: Every day | ORAL | Status: DC
  Administered 2021-03-06 – 2021-03-12 (×7): 20 mg via ORAL
  Filled 2021-03-05 (×7): qty 1

## 2021-03-05 MED ORDER — SODIUM CHLORIDE 0.9 % IV SOLN
1.0000 g | Freq: Once | INTRAVENOUS | Status: AC
  Administered 2021-03-05: 1 g via INTRAVENOUS
  Filled 2021-03-05: qty 10

## 2021-03-05 MED ORDER — ALBUTEROL SULFATE (2.5 MG/3ML) 0.083% IN NEBU: 3.0000 mL | INHALATION_SOLUTION | Freq: Four times a day (QID) | RESPIRATORY_TRACT | Status: DC | PRN

## 2021-03-05 MED ORDER — ASPIRIN EC 81 MG PO TBEC
81.0000 mg | DELAYED_RELEASE_TABLET | Freq: Every day | ORAL | Status: DC
  Administered 2021-03-06 – 2021-03-14 (×9): 81 mg via ORAL
  Filled 2021-03-05 (×10): qty 1

## 2021-03-05 MED ORDER — PANTOPRAZOLE SODIUM 40 MG PO TBEC
40.0000 mg | DELAYED_RELEASE_TABLET | Freq: Every day | ORAL | Status: DC
  Administered 2021-03-05 – 2021-03-14 (×10): 40 mg via ORAL
  Filled 2021-03-05 (×10): qty 1

## 2021-03-05 MED ORDER — FUROSEMIDE 10 MG/ML IJ SOLN
40.0000 mg | Freq: Once | INTRAMUSCULAR | Status: AC
  Administered 2021-03-05: 40 mg via INTRAVENOUS
  Filled 2021-03-05: qty 4

## 2021-03-05 MED ORDER — SODIUM CHLORIDE 0.9 % IV SOLN
500.0000 mg | INTRAVENOUS | Status: DC
  Administered 2021-03-06 – 2021-03-07 (×2): 500 mg via INTRAVENOUS
  Filled 2021-03-05 (×3): qty 500

## 2021-03-05 MED ORDER — PIRFENIDONE 267 MG PO TABS
267.0000 mg | ORAL_TABLET | ORAL | Status: DC
  Administered 2021-03-06 – 2021-03-14 (×18): 267 mg via ORAL
  Filled 2021-03-05: qty 1

## 2021-03-05 MED ORDER — VITAMIN B-12 1000 MCG PO TABS
1000.0000 ug | ORAL_TABLET | Freq: Every day | ORAL | Status: DC
  Administered 2021-03-05 – 2021-03-14 (×10): 1000 ug via ORAL
  Filled 2021-03-05 (×10): qty 1

## 2021-03-05 MED ORDER — ACETAMINOPHEN 500 MG PO TABS
1000.0000 mg | ORAL_TABLET | Freq: Four times a day (QID) | ORAL | Status: DC | PRN
  Administered 2021-03-13: 1000 mg via ORAL
  Filled 2021-03-05: qty 2

## 2021-03-05 MED ORDER — MEMANTINE HCL 10 MG PO TABS
10.0000 mg | ORAL_TABLET | Freq: Two times a day (BID) | ORAL | Status: DC
  Administered 2021-03-05 – 2021-03-14 (×18): 10 mg via ORAL
  Filled 2021-03-05 (×2): qty 1
  Filled 2021-03-05: qty 2
  Filled 2021-03-05 (×9): qty 1
  Filled 2021-03-05: qty 2
  Filled 2021-03-05 (×5): qty 1

## 2021-03-05 MED ORDER — DOCUSATE SODIUM 100 MG PO CAPS
100.0000 mg | ORAL_CAPSULE | Freq: Every day | ORAL | Status: DC
  Administered 2021-03-05 – 2021-03-14 (×10): 100 mg via ORAL
  Filled 2021-03-05 (×10): qty 1

## 2021-03-05 MED ORDER — METOPROLOL SUCCINATE ER 25 MG PO TB24
25.0000 mg | ORAL_TABLET | Freq: Every day | ORAL | Status: DC
  Administered 2021-03-05 – 2021-03-13 (×9): 25 mg via ORAL
  Filled 2021-03-05 (×9): qty 1

## 2021-03-05 MED ORDER — ASCORBIC ACID 500 MG PO TABS
500.0000 mg | ORAL_TABLET | Freq: Every day | ORAL | Status: DC
  Administered 2021-03-05 – 2021-03-14 (×10): 500 mg via ORAL
  Filled 2021-03-05 (×10): qty 1

## 2021-03-05 MED ORDER — FLUTICASONE FUROATE-VILANTEROL 200-25 MCG/ACT IN AEPB
1.0000 | INHALATION_SPRAY | Freq: Every day | RESPIRATORY_TRACT | Status: DC
  Administered 2021-03-06 – 2021-03-12 (×5): 1 via RESPIRATORY_TRACT
  Filled 2021-03-05 (×2): qty 28

## 2021-03-05 MED ORDER — ENOXAPARIN SODIUM 40 MG/0.4ML IJ SOSY
40.0000 mg | PREFILLED_SYRINGE | INTRAMUSCULAR | Status: DC
  Administered 2021-03-05 – 2021-03-13 (×9): 40 mg via SUBCUTANEOUS
  Filled 2021-03-05 (×9): qty 0.4

## 2021-03-05 MED ORDER — SODIUM CHLORIDE 0.9 % IV SOLN
1.0000 g | INTRAVENOUS | Status: AC
  Administered 2021-03-06 – 2021-03-09 (×4): 1 g via INTRAVENOUS
  Filled 2021-03-05 (×4): qty 10

## 2021-03-05 MED ORDER — METOPROLOL SUCCINATE ER 50 MG PO TB24
50.0000 mg | ORAL_TABLET | Freq: Every day | ORAL | Status: DC
  Administered 2021-03-06 – 2021-03-14 (×9): 50 mg via ORAL
  Filled 2021-03-05 (×9): qty 1

## 2021-03-05 MED ORDER — METOPROLOL SUCCINATE ER 50 MG PO TB24: 25.0000 mg | ORAL_TABLET | ORAL | Status: DC

## 2021-03-05 MED ORDER — ASPIRIN 81 MG PO CHEW
324.0000 mg | CHEWABLE_TABLET | Freq: Once | ORAL | Status: AC
  Administered 2021-03-05: 324 mg via ORAL
  Filled 2021-03-05: qty 4

## 2021-03-05 MED ORDER — SODIUM CHLORIDE 0.9 % IV SOLN
500.0000 mg | Freq: Once | INTRAVENOUS | Status: AC
  Administered 2021-03-05: 500 mg via INTRAVENOUS
  Filled 2021-03-05: qty 500

## 2021-03-05 NOTE — ED Triage Notes (Signed)
Pt c/o generalized weakness, productive cough,fatigue, nausea, and decreased appetite for the past 2 weeks.

## 2021-03-05 NOTE — ED Provider Notes (Signed)
[facility] DEPT Provider Note   CSN: [id number] Arrival date & time: 03/05/21  1527     History Chief Complaint  Patient presents with   Nausea   Fatigue   decreased appetite    Isaac [name] is a 80 y.o. male.  Pt with generalized weakness in the past 1-2 weeks. Symptoms acute onset, cough, occasionally with small amount phlegm, decreased appetite/poor po intake, generalized weakness. No focal or unilateral weakness. No syncope, trauma or fall. Denies headache. No chest pain or sob. No abd pain or vomiting/diarrhea. No dysuria or gu c/o. No skin lesions or rash. No extremity pain, redness, or swelling. No recent change in meds. No specific known ill contacts or known covid/flu exposure.   The history is provided by the patient, a relative and medical records. The history is limited by the condition of the patient.      Past Medical History:  Diagnosis Date   Allergic rhinitis    Allergy    Arthritis 06/04/2014   Trial of sulindac 05/30/14 since cri risk with other nsaids    Barrett's esophagus 10/27/2011   BPH (benign prostatic hyperplasia) 02/20/2014   CAD (coronary artery disease) of artery bypass graft, Occluded VG-non dominant LCX medical therapy 11/25/2013   CAD (coronary artery disease), native coronary artery - LIMA-LAD, SVG-Diag, SVG-RCA 2002    Catheterization 2002 showing occluded LAD, 99% diagonal, occluded distal circumflex, 90% nondominant right coronary artery  CABG 08/06/00 by Dr. Nils Pyle with LIMA to LAD, SVG to diagonal, SVG to right coronary artery, circumflex was noted to be too small to graft     CAP (community acquired pneumonia) 04/16/2016   See cxr 04/15/16 > treated with 7 days of Levaquin 500 mg daily with resolution radiographically and clinically.   Cataract    Cerumen impaction 06/05/2015   CHF (congestive heart failure), NYHA class III ([place]) 02/20/2014   Dementia ([place])    Dyspnea    with exertion    Dyspnea on  exertion 11/08/2013   Followed as Primary Care Patient/ [facility]/ [name]  - new onset early June 2015  - 11/08/2013  Walked RA x 3 laps @ 185 ft each stopped due to  End of study, mild sob, no cp and no desat, EKG ok  - Cardiac w/u inconclusive but not clearly having ischemia  11/24/13   - 09/11/2016  Walked RA x 3 laps @ 185 ft each stopped due to  End of study, nl pace, no sob or desat   - Spirometry 09/11/2016     Essential hypertension    Referred to Int med [facility] 12/12/2016    GERD (gastroesophageal reflux disease)    H/O hiatal hernia    History of skin cancer    bilateral arms with removal   Hyperlipidemia    Hyperlipidemia LDL goal <70    Followed as Primary Care Patient/ [facility]/ [name]     - Target LDL < 70 as has IHD     Hypertension    Hypertensive heart disease    Followed as Primary Care Patient/ [facility]/ [name]      Ischemic heart disease    Left femoral hernia s/p laparoscopic repair 05/28/2017 05/28/2017   Lumbar disc disease    Myocardial infarction [facility]) 2002   Right inguinal hernia s/p laparoscopic repair 05/28/2017 05/28/2017   Stage 3 chronic kidney disease ([place]) 05/22/2015   trial off lasix/ppi/clinoril / [place] and renal eval 05/22/2015 >>>  T wave inversion in EKG 02/20/2014   Unstable angina ([facility]) 11/23/2013    Patient Active Problem List   Diagnosis Date Noted   Dementia ([place]) 01/08/2021   Chronic diastolic CHF (congestive heart failure) ([place]) 12/07/2020   Paroxysmal atrial fibrillation ([place])    History of TIA (transient ischemic attack) 11/27/2020   Thrombocytopenia ([place]) 11/27/2020   Orthostatic hypotension 11/27/2020   Right inguinal hernia s/p laparoscopic repair 05/28/2017 05/28/2017   Left femoral hernia s/p laparoscopic repair 05/28/2017 05/28/2017   Stage 3 chronic kidney disease ([place]) 05/22/2015   CHF (congestive heart failure), NYHA class III ([place]) 02/20/2014   BPH (benign prostatic hyperplasia) 02/20/2014   T wave inversion  in EKG 02/20/2014   Coronary artery disease 11/25/2013   Essential hypertension    Unstable angina ([place]) 11/23/2013   Dyspnea on exertion 11/08/2013   Barrett's esophagus 10/27/2011   GERD (gastroesophageal reflux disease) 07/14/2011   CAD (coronary artery disease), native coronary artery - LIMA-LAD, SVG-Diag, SVG-RCA 2002    Hyperlipidemia LDL goal <70    Hypertensive heart disease     Past Surgical History:  Procedure Laterality Date   COLONOSCOPY     CORONARY ARTERY BYPASS GRAFT  2002   LIMA-LAD, SVG-Diag, SVG-RCA   EYE SURGERY Bilateral 1995, 2000   ioc for cataracts   GROIN DISSECTION  01/23/2012   Procedure: Virl Son EXPLORATION;  Surgeon: Harl Bowie, MD;  Location: [facility];  Service: General;  Laterality: Left;  Left Inguinal Exploration, release of scar tissue, Placement of Mesh   INGUINAL HERNIA REPAIR Left 1983   INGUINAL HERNIA REPAIR Bilateral 05/28/2017   Procedure: LAPAROSCOPIC RIGHT AND LEFT INGUINAL HERNIA REPAIR WITH MESH;  Surgeon: Michael Boston, MD;  Location: [facility];  Service: General;  Laterality: Bilateral;   INSERTION OF MESH Bilateral 05/28/2017   Procedure: INSERTION OF MESH;  Surgeon: Michael Boston, MD;  Location: [facility];  Service: General;  Laterality: Bilateral;   LEFT HEART CATHETERIZATION WITH CORONARY ANGIOGRAM N/A 11/24/2013   Procedure: LEFT HEART CATHETERIZATION WITH CORONARY ANGIOGRAM;  Surgeon: Peter M Martinique, MD;  Location[facility] CATH LAB;  Service: Cardiovascular;  Laterality: N/A;   NOSE SURGERY  1985       Family History  Problem Relation Age of Onset   Breast cancer Mother    Diabetes Mother    Hypertension Father    Dementia Father 71       early 9s   Diabetes Sister    COPD Brother        smoker   Colon cancer Neg Hx    Esophageal cancer Neg Hx    Rectal cancer Neg Hx    Stomach cancer Neg Hx     Social History   Tobacco Use   Smoking status: Never   Smokeless tobacco: Former     Types: Chew    Quit date: 04/28/2014  Vaping Use   Vaping Use: Never used  Substance Use Topics   Alcohol use: No    Alcohol/week: 0.0 standard drinks   Drug use: No    Home Medications Prior to Admission medications   Medication Sig Start Date End Date Taking? Authorizing Provider  albuterol (VENTOLIN HFA) 108 (90 Base) MCG/ACT inhaler Inhale 2 puffs into the lungs every 6 (six) hours as needed for wheezing or shortness of breath. 02/09/20   Noemi Chapel P, DO  amLODipine (NORVASC) 2.5 MG tablet Take 2.5 mg by mouth daily. 09/13/20   [provider]  aspirin  81 MG EC tablet Take 81 mg by mouth every morning.    [provider]  Cholecalciferol (VITAMIN D) 2000 UNITS tablet Take 2,000 Units by mouth daily.    [provider]  docusate sodium (COLACE) 100 MG capsule Take 100 mg by mouth daily. Take 1 capsule by mouth every other day    [provider]  fluticasone (FLONASE) 50 MCG/ACT nasal spray Place 2 sprays into both nostrils daily as needed for allergies or rhinitis.     [provider]  fluticasone-salmeterol (ADVAIR) 250-50 MCG/ACT AEPB INHALE 1 DOSE BY MOUTH TWICE DAILY 12/20/20   Mannam, Praveen, MD  furosemide (LASIX) 40 MG tablet Take 1 tablet (40 mg total) by mouth daily as needed for fluid or edema. 07/06/20   Dettinger, Fransisca Kaufmann, MD  memantine (NAMENDA) 10 MG tablet Take 1 tablet (10 mg total) by mouth 2 (two) times daily. Start after finishing the namenda starter pack first 07/16/20   Garvin Fila, MD  metoprolol succinate (TOPROL-XL) 50 MG 24 hr tablet Take 1 tablet (50 mg total) by mouth daily. 12/01/20   Kathie Dike, MD  Multiple Vitamin (MULTIVITAMIN WITH MINERALS) TABS Take 1 tablet by mouth daily.    [provider]  multivitamin-lutein (OCUVITE-LUTEIN) CAPS capsule Take 1 capsule by mouth daily.    [provider]  nitroGLYCERIN (NITROSTAT) 0.4 MG SL tablet DISSOLVE ONE TABLET UNDER THE TONGUE EVERY 5 MINUTES  AS NEEDED FOR CHEST PAIN.  DO NOT EXCEED A TOTAL OF 3 DOSES IN 15 MINUTES 11/05/20   Dettinger, Fransisca Kaufmann, MD  omeprazole (PRILOSEC) 20 MG capsule Take 1 capsule by mouth twice daily 02/27/21   Dettinger, Fransisca Kaufmann, MD  ondansetron (ZOFRAN) 4 MG tablet TAKE 1 TABLET BY MOUTH THREE TIMES DAILY AS NEEDED FOR NAUSEA AND VOMITING 08/29/20   Mannam, Praveen, MD  Pirfenidone (ESBRIET) 267 MG TABS TAKE 2 TABLETS (534 MG TOTAL) BY MOUTH IN THE MORNING, AT NOON, AND AT BEDTIME. 09/10/20 09/10/21  Mannam, Hart Robinsons, MD  potassium chloride SA (KLOR-CON M20) 20 MEQ tablet TAKE ONE TABLET BY MOUTH ONCE DAILY WHEN  YOU  TAKE LASIX 08/12/17   Dettinger, Fransisca Kaufmann, MD  QUEtiapine (SEROQUEL) 25 MG tablet Take 3 tablets (75 mg total) by mouth at bedtime. 12/09/20 01/08/21  Manuella Ghazi, Pratik D, DO  simethicone (MYLICON) 332 MG chewable tablet Chew 125 mg by mouth every 6 (six) hours as needed for flatulence.    [provider]  simvastatin (ZOCOR) 20 MG tablet TAKE 1 TABLET BY MOUTH AT BEDTIME 10/26/20   Dettinger, Fransisca Kaufmann, MD  vitamin B-12 (CYANOCOBALAMIN) 1000 MCG tablet Take 1 tablet (1,000 mcg total) by mouth daily. 12/01/20   Kathie Dike, MD    Allergies    Patient has no known allergies.  Review of Systems   Review of Systems  Constitutional:  Positive for fever.  HENT:  Negative for sore throat.   Eyes:  Negative for redness.  Respiratory:  Positive for cough. Negative for shortness of breath.   Cardiovascular:  Negative for chest pain.  Gastrointestinal:  Negative for abdominal pain, diarrhea and vomiting.  Genitourinary:  Negative for dysuria and flank pain.  Musculoskeletal:  Negative for back pain, neck pain and neck stiffness.  Skin:  Negative for rash.  Neurological:  Negative for headaches.  Hematological:  Does not bruise/bleed easily.  Psychiatric/Behavioral:  Negative for confusion.    Physical Exam Updated Vital Signs BP (!) 159/73 (BP Location: Right Arm)   Pulse 82  Temp 98.7 F (37.1 C)  (Oral)   Resp (!) 21   Wt 63.5 kg   SpO2 97%   BMI 19.80 kg/m   Physical Exam Vitals and nursing note reviewed.  Constitutional:      Appearance: Normal appearance. He is well-developed.  HENT:     Head: Atraumatic.     Nose: Nose normal.     Mouth/Throat:     Mouth: Mucous membranes are moist.     Pharynx: Oropharynx is clear.  Eyes:     General: No scleral icterus.    Conjunctiva/sclera: Conjunctivae normal.     Pupils: Pupils are equal, round, and reactive to light.  Neck:     Vascular: No carotid bruit.     Trachea: No tracheal deviation.     Comments: No stiffness or rigidity.  Cardiovascular:     Rate and Rhythm: Normal rate and regular rhythm.     Pulses: Normal pulses.     Heart sounds: Normal heart sounds. No murmur heard.   No friction rub. No gallop.  Pulmonary:     Effort: Pulmonary effort is normal. No accessory muscle usage or respiratory distress.     Breath sounds: Normal breath sounds.  Abdominal:     General: Bowel sounds are normal. There is no distension.     Palpations: Abdomen is soft.     Tenderness: There is no abdominal tenderness. There is no guarding.  Genitourinary:    Comments: No cva tenderness. Musculoskeletal:        General: No swelling or tenderness.     Cervical back: Normal range of motion and neck supple. No rigidity.     Right lower leg: No edema.     Left lower leg: No edema.  Skin:    General: Skin is warm and dry.     Findings: No rash.  Neurological:     Mental Status: He is alert.     Comments: Alert, speech clear. Motor/sens grossly intact bil.   Psychiatric:        Mood and Affect: Mood normal.    ED Results / Procedures / Treatments   Labs (all labs ordered are listed, but only abnormal results are displayed) Results for orders placed or performed during the hospital encounter of 03/05/21  Brain natriuretic peptide  Result Value Ref Range   B Natriuretic Peptide 662.4 (H) 0.0 - 100.0 pg/mL  CBC with  Differential  Result Value Ref Range   WBC 13.4 (H) 4.0 - 10.5 K/uL   RBC 3.52 (L) 4.22 - 5.81 MIL/uL   Hemoglobin 11.1 (L) 13.0 - 17.0 g/dL   HCT 33.5 (L) 39.0 - 52.0 %   MCV 95.2 80.0 - 100.0 fL   MCH 31.5 26.0 - 34.0 pg   MCHC 33.1 30.0 - 36.0 g/dL   RDW 13.5 11.5 - 15.5 %   Platelets 219 150 - 400 K/uL   nRBC 0.0 0.0 - 0.2 %   Neutrophils Relative % 85 %   Neutro Abs 11.2 (H) 1.7 - 7.7 K/uL   Lymphocytes Relative 7 %   Lymphs Abs 1.0 0.7 - 4.0 K/uL   Monocytes Relative 7 %   Monocytes Absolute 1.0 0.1 - 1.0 K/uL   Eosinophils Relative 0 %   Eosinophils Absolute 0.1 0.0 - 0.5 K/uL   Basophils Relative 0 %   Basophils Absolute 0.0 0.0 - 0.1 K/uL   Immature Granulocytes 1 %   Abs Immature Granulocytes 0.18 (H) 0.00 - 0.07 K/uL  Comprehensive metabolic panel  Result Value Ref Range   Sodium 139 135 - 145 mmol/L   Potassium 3.6 3.5 - 5.1 mmol/L   Chloride 103 98 - 111 mmol/L   CO2 27 22 - 32 mmol/L   Glucose, Bld 134 (H) 70 - 99 mg/dL   BUN 32 (H) 8 - 23 mg/dL   Creatinine, Ser 1.48 (H) 0.61 - 1.24 mg/dL   Calcium 8.8 (L) 8.9 - 10.3 mg/dL   Total Protein 7.3 6.5 - 8.1 g/dL   Albumin 3.0 (L) 3.5 - 5.0 g/dL   AST 53 (H) 15 - 41 U/L   ALT 27 0 - 44 U/L   Alkaline Phosphatase 72 38 - 126 U/L   Total Bilirubin 0.9 0.3 - 1.2 mg/dL   GFR, Estimated 48 (L) >60 mL/min   Anion gap 9 5 - 15  Troponin I (High Sensitivity)  Result Value Ref Range   Troponin I (High Sensitivity) 410 (HH) <18 ng/L      EKG EKG Interpretation  Date/Time:  Tuesday March 05 2021 18:23:16 EST Ventricular Rate:  73 PR Interval:  138 QRS Duration: 115 QT Interval:  427 QTC Calculation: 471 R Axis:   17 Text Interpretation: Sinus rhythm Atrial premature complex Non-specific ST-t changes Baseline wander Confirmed by Lajean Saver [phone]) on 03/05/2021 6:26:02 PM  Radiology DG Chest 2 View  Result Date: 03/05/2021 CLINICAL DATA:  Shortness of breath, productive cough, fatigue. EXAM: CHEST - 2  VIEW COMPARISON:  Chest radiograph dated December 07, 2020 FINDINGS: The heart is enlarged. Evidence of prior coronary artery bypass grafting. Hyperinflated lungs with bibasilar opacities concerning for atelectasis or infiltrate. Follow-up examination to resolution is recommended IMPRESSION: 1.  Cardiomegaly. 2.  Bibasilar opacities concerning for atelectasis or infiltrate. Electronically Signed   By: Keane Police D.O.   On: 03/05/2021 16:34    Procedures Procedures   Medications Ordered in ED Medications  aspirin chewable tablet 324 mg (has no administration in time range)    ED Course  I have reviewed the triage vital signs and the nursing notes.  Pertinent labs & imaging results that were available during my care of the patient were reviewed by me and considered in my medical decision making (see chart for details).    MDM Rules/Calculators/A&P                          Iv ns. Labs sent. Imaging ordered.   Reviewed nursing notes and prior charts for additional history.   Labs reviewed/interpreted by me - trop high. No chest pain currently. Asa po. Bnp high. ?vascular congestion on imaging.   CXR reviewed/interpreted by me -  basilar atelectasis.   Given weakness, high trop, will admit.   Hospitalists consulted for admission.     Final Clinical Impression(s) / ED Diagnoses Final diagnoses:  None    Rx / DC Orders ED Discharge Orders     None        Lajean Saver, MD 03/05/21 Einar Crow

## 2021-03-05 NOTE — ED Provider Notes (Addendum)
Emergency Medicine Provider Triage Evaluation Note  Isaac [name] , a 80 y.o. male  was evaluated in triage.  Pt complains of productive cough, generalized weakness, subjective fever for the past 2 weeks.  According to wife at the bedside, he has been more weak than usual, decrease in ambulation.In addition, complains of right flank pain however without urinary symptoms.  PCP wanted an x-ray to further evaluate for pneumonia versus flu.  Patient did have vaccinations for both this season. He did have negative covid 19 test at home.   Review of Systems  Positive: Cough, shortness of breath, generalized weakness, nausea Negative: Chest pain, abdominal pain, vomiting  Physical Exam  There were no vitals taken for this visit. Gen:   Awake, no distress   Resp:  Normal effort  MSK:   Moves extremities without difficulty  Other:  Rales to the right upper and lower lobe, decrease breath sounds throughout.   Medical Decision Making  Medically screening exam initiated at 3:45 PM.  Appropriate orders placed.  [name] was informed that the remainder of the evaluation will be completed by another provider, this initial triage assessment does not replace that evaluation, and the importance of remaining in the ED until their evaluation is complete.  Patient here with generalized weakness.  Dementia at baseline, accompanied by family member at the bedside.  Stable vital signs, will order labs along with chest x-ray.  Exam remarkable for rales to the right upper and lower lung lobe.   Janeece Fitting, PA-C 03/05/21 [place], Hydia Copelin, PA-C 03/05/21 1551    Charlesetta Shanks, MD 03/05/21 1610

## 2021-03-05 NOTE — H&P (Signed)
History and Physical    Isaac [name] ZJQ:[id number] DOB: [birth year] DOA: 03/05/2021  PCP: Dettinger, Fransisca Kaufmann, MD  Patient coming from: Home  I have personally briefly reviewed patient's old medical records in [place]  Chief Complaint: Weakness decreased appetite, cough and shortness of breath  HPI: Isaac [name] is a 80 y.o. male with medical history significant for dementia, CAD s/p CABG, chronic diastolic heart failure, pulmonary fibrosis, CKD stage IIIa, TIA, hyperlipidemia and GERD who presents with concerns of weakness, decreased oral intake, shortness of breath cough.  Wife at bedside provides history as patient has dementia and altered mentation.  She reports that he has been sick the past 2 weeks.  Has decreased oral intake and has progressively gotten weak to the point that he could barely ambulate.  Normally walks with a cane he uses.  Has new cough productive of greenish sputum.  Some labored respiration.  No nausea, vomiting or diarrhea.  No complaints of pain associated. no lower extremity edema.  ED Course: He was afebrile, normotensive and initially required 3 L via nasal cannula.  WBC 13.4, hemoglobin of 11.1, platelet of 219. Sodium of 139, K of 5.6, creatinine of 1.48, BG of 139. troponin elevated at 410 with no significant changes in EKG compared to prior.  BNP of 662.  Chest x-ray shows bibasilar opacities atelectasis versus infiltrate. COVID/FLU PCR pending.   He was started on IV Rocephin and azithromycin for presumed pneumonia.  Hospitalist then called for admission for further management.   Review of Systems: Unable to obtain for ROS due to patient dementia  Past Medical History:  Diagnosis Date   Allergic rhinitis    Allergy    Arthritis 06/04/2014   Trial of sulindac 05/30/14 since cri risk with other nsaids    Barrett's esophagus 10/27/2011   BPH (benign prostatic hyperplasia) 02/20/2014   CAD (coronary artery disease) of artery bypass graft,  Occluded VG-non dominant LCX medical therapy 11/25/2013   CAD (coronary artery disease), native coronary artery - LIMA-LAD, SVG-Diag, SVG-RCA 2002    Catheterization 2002 showing occluded LAD, 99% diagonal, occluded distal circumflex, 90% nondominant right coronary artery  CABG 08/06/00 by Dr. Nils Pyle with LIMA to LAD, SVG to diagonal, SVG to right coronary artery, circumflex was noted to be too small to graft     CAP (community acquired pneumonia) 04/16/2016   See cxr 04/15/16 > treated with 7 days of Levaquin 500 mg daily with resolution radiographically and clinically.   Cataract    Cerumen impaction 06/05/2015   CHF (congestive heart failure), NYHA class III ([place]) 02/20/2014   Dementia ([place])    Dyspnea    with exertion    Dyspnea on exertion 11/08/2013   Followed as Primary Care Patient/ [facility]/ [name]  - new onset early June 2015  - 11/08/2013  Walked RA x 3 laps @ 185 ft each stopped due to  End of study, mild sob, no cp and no desat, EKG ok  - Cardiac w/u inconclusive but not clearly having ischemia  11/24/13   - 09/11/2016  Walked RA x 3 laps @ 185 ft each stopped due to  End of study, nl pace, no sob or desat   - Spirometry 09/11/2016     Essential hypertension    Referred to Int med [place] 12/12/2016    GERD (gastroesophageal reflux disease)    H/O hiatal hernia    History of skin cancer    bilateral arms with removal  Hyperlipidemia    Hyperlipidemia LDL goal <70    Followed as Primary Care Patient/ [facility]/ [name]     - Target LDL < 70 as has IHD     Hypertension    Hypertensive heart disease    Followed as Primary Care Patient/ [facility]/ [name]      Ischemic heart disease    Left femoral hernia s/p laparoscopic repair 05/28/2017 05/28/2017   Lumbar disc disease    Myocardial infarction [facility]) 2002   Right inguinal hernia s/p laparoscopic repair 05/28/2017 05/28/2017   Stage 3 chronic kidney disease ([place]) 05/22/2015   trial off lasix/ppi/clinoril / [place]  and renal eval 05/22/2015 >>>     T wave inversion in EKG 02/20/2014   Unstable angina ([place]) 11/23/2013    Past Surgical History:  Procedure Laterality Date   COLONOSCOPY     CORONARY ARTERY BYPASS GRAFT  2002   LIMA-LAD, SVG-Diag, SVG-RCA   EYE SURGERY Bilateral 1995, 2000   ioc for cataracts   GROIN DISSECTION  01/23/2012   Procedure: Virl Son EXPLORATION;  Surgeon: Harl Bowie, MD;  Location: [facility];  Service: General;  Laterality: Left;  Left Inguinal Exploration, release of scar tissue, Placement of Mesh   INGUINAL HERNIA REPAIR Left [place] Bilateral 05/28/2017   Procedure: LAPAROSCOPIC RIGHT AND LEFT INGUINAL HERNIA REPAIR WITH MESH;  Surgeon: Michael Boston, MD;  Location: [facility];  Service: General;  Laterality: Bilateral;   INSERTION OF MESH Bilateral 05/28/2017   Procedure: INSERTION OF MESH;  Surgeon: Michael Boston, MD;  Location: [facility];  Service: General;  Laterality: Bilateral;   LEFT HEART CATHETERIZATION WITH CORONARY ANGIOGRAM N/A 11/24/2013   Procedure: LEFT HEART CATHETERIZATION WITH CORONARY ANGIOGRAM;  Surgeon: Peter M Martinique, MD;  Location: [facility] CATH LAB;  Service: Cardiovascular;  Laterality: N/A;   NOSE SURGERY  1985     reports that he has never smoked. He quit smokeless tobacco use about 6 years ago.  His smokeless tobacco use included chew. He reports that he does not drink alcohol and does not use drugs. Social History  No Known Allergies  Family History  Problem Relation Age of Onset   Breast cancer Mother    Diabetes Mother    Hypertension Father    Dementia Father 13       early 75s   Diabetes Sister    COPD Brother        smoker   Colon cancer Neg Hx    Esophageal cancer Neg Hx    Rectal cancer Neg Hx    Stomach cancer Neg Hx      Prior to Admission medications   Medication Sig Start Date End Date Taking? Authorizing Provider  albuterol (VENTOLIN HFA) 108 (90 Base) MCG/ACT inhaler  Inhale 2 puffs into the lungs every 6 (six) hours as needed for wheezing or shortness of breath. 02/09/20   Noemi Chapel P, DO  amLODipine (NORVASC) 2.5 MG tablet Take 2.5 mg by mouth daily. 09/13/20   [provider]  aspirin 81 MG EC tablet Take 81 mg by mouth every morning.    [provider]  Cholecalciferol (VITAMIN D) 2000 UNITS tablet Take 2,000 Units by mouth daily.    [provider]  docusate sodium (COLACE) 100 MG capsule Take 100 mg by mouth daily. Take 1 capsule by mouth every other day    [provider]  fluticasone (FLONASE) 50 MCG/ACT nasal spray Place 2 sprays  into both nostrils daily as needed for allergies or rhinitis.     [provider]  fluticasone-salmeterol (ADVAIR) 250-50 MCG/ACT AEPB INHALE 1 DOSE BY MOUTH TWICE DAILY 12/20/20   Mannam, Praveen, MD  furosemide (LASIX) 40 MG tablet Take 1 tablet (40 mg total) by mouth daily as needed for fluid or edema. 07/06/20   Dettinger, Fransisca Kaufmann, MD  memantine (NAMENDA) 10 MG tablet Take 1 tablet (10 mg total) by mouth 2 (two) times daily. Start after finishing the namenda starter pack first 07/16/20   Garvin Fila, MD  metoprolol succinate (TOPROL-XL) 50 MG 24 hr tablet Take 1 tablet (50 mg total) by mouth daily. 12/01/20   Kathie Dike, MD  Multiple Vitamin (MULTIVITAMIN WITH MINERALS) TABS Take 1 tablet by mouth daily.    [provider]  multivitamin-lutein (OCUVITE-LUTEIN) CAPS capsule Take 1 capsule by mouth daily.    [provider]  nitroGLYCERIN (NITROSTAT) 0.4 MG SL tablet DISSOLVE ONE TABLET UNDER THE TONGUE EVERY 5 MINUTES AS NEEDED FOR CHEST PAIN.  DO NOT EXCEED A TOTAL OF 3 DOSES IN 15 MINUTES 11/05/20   Dettinger, Fransisca Kaufmann, MD  omeprazole (PRILOSEC) 20 MG capsule Take 1 capsule by mouth twice daily 02/27/21   Dettinger, Fransisca Kaufmann, MD  ondansetron (ZOFRAN) 4 MG tablet TAKE 1 TABLET BY MOUTH THREE TIMES DAILY AS NEEDED FOR NAUSEA AND VOMITING 08/29/20   Mannam,  Praveen, MD  Pirfenidone (ESBRIET) 267 MG TABS TAKE 2 TABLETS (534 MG TOTAL) BY MOUTH IN THE MORNING, AT NOON, AND AT BEDTIME. 09/10/20 09/10/21  Mannam, Hart Robinsons, MD  potassium chloride SA (KLOR-CON M20) 20 MEQ tablet TAKE ONE TABLET BY MOUTH ONCE DAILY WHEN  YOU  TAKE LASIX 08/12/17   Dettinger, Fransisca Kaufmann, MD  QUEtiapine (SEROQUEL) 25 MG tablet Take 3 tablets (75 mg total) by mouth at bedtime. 12/09/20 01/08/21  Manuella Ghazi, Pratik D, DO  simethicone (MYLICON) 664 MG chewable tablet Chew 125 mg by mouth every 6 (six) hours as needed for flatulence.    [provider]  simvastatin (ZOCOR) 20 MG tablet TAKE 1 TABLET BY MOUTH AT BEDTIME 10/26/20   Dettinger, Fransisca Kaufmann, MD  vitamin B-12 (CYANOCOBALAMIN) 1000 MCG tablet Take 1 tablet (1,000 mcg total) by mouth daily. 12/01/20   Kathie Dike, MD    Physical Exam: Vitals:   03/05/21 1545 03/05/21 1546 03/05/21 1900  BP: (!) 143/71  (!) 159/73  Pulse: 76  82  Resp: 19  (!) 21  Temp: 98.7 F (37.1 C)    TempSrc: Oral    SpO2: 97%  97%  Weight:  63.5 kg     Constitutional: NAD, calm, comfortable, chronically ill-appearing in elderly male lying flat in bed Vitals:   03/05/21 1545 03/05/21 1546 03/05/21 1900  BP: (!) 143/71  (!) 159/73  Pulse: 76  82  Resp: 19  (!) 21  Temp: 98.7 F (37.1 C)    TempSrc: Oral    SpO2: 97%  97%  Weight:  63.5 kg    Eyes: PERRL, lids and conjunctivae normal.  Mostly head eyes closed throughout evaluation ENMT: Mucous membranes are moist.  Neck: normal, supple Respiratory: Faint bibasilar crackles.  Normal respiratory effort on room air.  No accessory muscle use.  Cardiovascular: Regular rate and rhythm, no murmurs / rubs / gallops. No extremity edema. Abdomen: no tenderness, no masses palpated. No hepatosplenomegaly. Bowel sounds positive.  Musculoskeletal: no clubbing / cyanosis. No joint deformity upper and lower extremities.  Muscle wasting of all 4  extremities. Skin: no rashes, lesions, ulcers. No  induration Neurologic: CN 2-12 grossly intact.  Able to move all extremities.  Able to give me his name and birthdate but confused about wife at bedside and thinks he is in his living room.  Not able to follow commands.   Psychiatric: Altered mental status.    Labs on Admission: I have personally reviewed following labs and imaging studies  CBC: Recent Labs  Lab 03/05/21 1609  WBC 13.4*  NEUTROABS 11.2*  HGB 11.1*  HCT 33.5*  MCV 95.2  PLT 939   Basic Metabolic Panel: Recent Labs  Lab 03/05/21 1609  NA 139  K 3.6  CL 103  CO2 27  GLUCOSE 134*  BUN 32*  CREATININE 1.48*  CALCIUM 8.8*   GFR: Estimated Creatinine Clearance: 35.8 mL/min (A) (by C-G formula based on SCr of 1.48 mg/dL (H)). Liver Function Tests: Recent Labs  Lab 03/05/21 1609  AST 53*  ALT 27  ALKPHOS 72  BILITOT 0.9  PROT 7.3  ALBUMIN 3.0*   No results for input(s): LIPASE, AMYLASE in the last 168 hours. No results for input(s): AMMONIA in the last 168 hours. Coagulation Profile: No results for input(s): INR, PROTIME in the last 168 hours. Cardiac Enzymes: No results for input(s): CKTOTAL, CKMB, CKMBINDEX, TROPONINI in the last 168 hours. BNP (last 3 results) Recent Labs    03/26/20 1124  PROBNP 5,675*   HbA1C: No results for input(s): HGBA1C in the last 72 hours. CBG: No results for input(s): GLUCAP in the last 168 hours. Lipid Profile: No results for input(s): CHOL, HDL, LDLCALC, TRIG, CHOLHDL, LDLDIRECT in the last 72 hours. Thyroid Function Tests: No results for input(s): TSH, T4TOTAL, FREET4, T3FREE, THYROIDAB in the last 72 hours. Anemia Panel: No results for input(s): VITAMINB12, FOLATE, FERRITIN, TIBC, IRON, RETICCTPCT in the last 72 hours. Urine analysis:    Component Value Date/Time   COLORURINE YELLOW 03/05/2021 1851   APPEARANCEUR HAZY (A) 03/05/2021 1851   APPEARANCEUR Clear 04/11/2020 1035   LABSPEC 1.024 03/05/2021 1851   PHURINE 5.0 03/05/2021 1851   GLUCOSEU  NEGATIVE 03/05/2021 1851   GLUCOSEU NEGATIVE 09/06/2012 0925   HGBUR NEGATIVE 03/05/2021 1851   BILIRUBINUR NEGATIVE 03/05/2021 1851   BILIRUBINUR Negative 04/11/2020 [place] 03/05/2021 1851   PROTEINUR 30 (A) 03/05/2021 1851   UROBILINOGEN 0.2 09/06/2012 0925   NITRITE NEGATIVE 03/05/2021 1851   LEUKOCYTESUR NEGATIVE 03/05/2021 1851    Radiological Exams on Admission: DG Chest 2 View  Result Date: 03/05/2021 CLINICAL DATA:  Shortness of breath, productive cough, fatigue. EXAM: CHEST - 2 VIEW COMPARISON:  Chest radiograph dated December 07, 2020 FINDINGS: The heart is enlarged. Evidence of prior coronary artery bypass grafting. Hyperinflated lungs with bibasilar opacities concerning for atelectasis or infiltrate. Follow-up examination to resolution is recommended IMPRESSION: 1.  Cardiomegaly. 2.  Bibasilar opacities concerning for atelectasis or infiltrate. Electronically Signed   By: Keane Police D.O.   On: 03/05/2021 16:34      Assessment/Plan  Acute hypoxic respiratory failure secondary to community-acquired pneumonia in the setting of pulmonary fibrosis -Initially required 3 L in the ED but was weaned down to the time of admission -X-ray showing atelectasis versus infiltrate and he has correlating symptoms with new productive cough and increasing shortness of breath with leukocytosis -Continue IV Rocephin and azithromycin   Elevated troponin History of CAD s/p CABG -Initial troponin of 410 with no specific EKG changes possible demand ischemia from pneumonia but also concerning for  unstable angina given cardiac history -Will continue to trend troponin and if there is significant delta change then will initiate IV heparin infusion -continue aspirin  Acute metabolic encephalopathy Dementia -Per wife at baseline he is alert and oriented to self, family and is aware of his surroundings.  Today he appears confused and has trouble recognizing his wife and thinks he is  at home -Suspect dementia worsened by acute illness  Chronic diastolic CHF -BNP elevated to 662 but appears clinically euvolemic -Last echocardiogram in 11/2020 with EF 55 to 60% and grade 1 diastolic -Continue metoprolol  Pulmonary fibrosis Continue Pirfenidone Follows outpatient with pulmonology  Hypertension -Continue metoprolol  CKD stage IIIa - Creatinine stable at baseline around 1.4  Hyperlipidemia Continue statin  DVT prophylaxis:.Lovenox Code Status: DNR Family Communication: Plan discussed with wife at bedside  disposition Plan: Home with at least 2 midnight stays  Consults called:  Admission status: inpatient  Level of care: Telemetry  Status is: Inpatient  Remains inpatient appropriate because: Acute metabolic encephalopathy and acute hypoxia initially requiring oxygen supplementation and treatment with IV         Orene Desanctis DO [facility]   If 7PM-7AM, please contact night-coverage [email or web address]   03/05/2021, 8:34 PM

## 2021-03-06 ENCOUNTER — Encounter (HOSPITAL_COMMUNITY): Payer: Self-pay | Admitting: Family Medicine

## 2021-03-06 DIAGNOSIS — J9601 Acute respiratory failure with hypoxia: Secondary | ICD-10-CM | POA: Diagnosis not present

## 2021-03-06 LAB — CBC
HCT: 34.4 % — ABNORMAL LOW (ref 39.0–52.0)
Hemoglobin: 11.5 g/dL — ABNORMAL LOW (ref 13.0–17.0)
MCH: 31.4 pg (ref 26.0–34.0)
MCHC: 33.4 g/dL (ref 30.0–36.0)
MCV: 94 fL (ref 80.0–100.0)
Platelets: 280 10*3/uL (ref 150–400)
RBC: 3.66 MIL/uL — ABNORMAL LOW (ref 4.22–5.81)
RDW: 13.3 % (ref 11.5–15.5)
WBC: 14.4 10*3/uL — ABNORMAL HIGH (ref 4.0–10.5)
nRBC: 0 % (ref 0.0–0.2)

## 2021-03-06 LAB — TROPONIN I (HIGH SENSITIVITY): Troponin I (High Sensitivity): 325 ng/L (ref <18)

## 2021-03-06 MED ORDER — HALOPERIDOL LACTATE 5 MG/ML IJ SOLN
1.0000 mg | Freq: Once | INTRAMUSCULAR | Status: AC
  Administered 2021-03-06: 1 mg via INTRAMUSCULAR
  Filled 2021-03-06: qty 1

## 2021-03-06 MED ORDER — ENSURE ENLIVE PO LIQD
237.0000 mL | Freq: Two times a day (BID) | ORAL | Status: DC
  Administered 2021-03-07 (×2): 237 mL via ORAL

## 2021-03-06 NOTE — Progress Notes (Signed)
PROGRESS NOTE    [name]  ZOX:[id number] DOB: 02-10-1941 DOA: 03/05/2021 PCP: Dettinger, Fransisca Kaufmann, MD    Brief Narrative:  Isaac [name] is an 80 year old male with past medical history significant for dementia, CAD s/p CABG, chronic diastolic congestive heart failure, pulmonary fibrosis, CKD stage IIIa, TIA, hyperlipidemia, GERD who presents to [facility] ED 11/8 from home with weakness, decreased oral intake, shortness of breath and cough.  Spouse present at bedside provides history given his underlying dementia and altered mental status.  She reports that patient has been sick for the last 2 weeks with decreased oral intake and now with inability to ambulate.  At baseline walks with a cane.  Cough is productive of green sputum with some increased respiratory effort.  No complaints of pain, no nausea/vomiting/diarrhea.  No lower extremity edema.  In the ED, temperature 98.7 F, HR 76, RR 19, BP 143/71, SPO2 88% and placed on 3 L nasal cannula.  Sodium 139, potassium 3.6, chloride 103, CO2 27, glucose 134, BUN 32, creatinine 1.48, AST 53, ALT 27, BNP 662.4.  High sensitive troponin 410> 384.  WBC 13.4, hemoglobin 11.1, platelets 219.  Covid-19 PCR negative.  Influenza A/B PCR negative.  Urinalysis unrevealing.  Chest x-ray with cardiomegaly, bibasilar opacities concerning for atelectasis versus infiltrate.  EKG personally reviewed with NSR, rate 73, QTc 471, no concerning dynamic changes.  EDP started IV Rocephin/azithromycin.  Hospitalist service consulted for further evaluation management of acute metabolic encephalopathy, weakness secondary to community acquired pneumonia.   Assessment & Plan:   Principal Problem:   Acute respiratory failure with hypoxia (HCC) Active Problems:   Hyperlipidemia LDL goal <70   CAD (coronary artery disease), native coronary artery - LIMA-LAD, SVG-Diag, SVG-RCA 2002   Essential hypertension   Chronic diastolic CHF (congestive heart failure) (HCC)   Acute  hypoxic respiratory failure, POA Community acquired pneumonia Patient presenting to ED with 2-week history of progressive weakness, productive cough with green sputum.  Elevated white blood cell count of 14.4 with chest x-ray findings consistent with pneumonia.  Patient is known to be hypoxic with SPO2 88% on room air requiring 3 L nasal cannula to maintain adequate oxygenation.  Suspect etiology atypical versus gram-negative organism. --Azithromycin 500 mg IV every 24 hours x5 days --Ceftriaxone 1 g IV every 24 hours x5 days --Continue supplemental oxygen, maintain SPO2 greater than 88%, on 3 L nasal cannula with SPO2 96% at rest  Elevated troponin High sensitive troponin peaked at 410 followed by 384.  EKG with no concerning dynamic changes.  Etiology likely secondary to type II demand ischemia in the setting of pneumonia as above. --Continue to monitor on telemetry  Essential hypertension Chronic diastolic congestive heart failure, compensated Home regimen includes metoprolol succinate 50 mg p.o. every morning, 25 mg p.o. nightly, furosemide 40 mg as needed for fluid/edema. --Metoprolol succinate 50 mg PO qAm and 25mg  PO qPM --Holding home furosemide --Strict I's and O's and daily weights  CAD s/p CABG --Continue aspirin and statin  Hyperlipidemia: Simvastatin 20 mg p.o. daily  Pulmonary fibrosis --Continue home Pirfenidone 267 mg TID  GERD On omeprazole 20 mg p.o. daily at home. --Continue Protonix 40 mg p.o. daily as hospital substitution  Dementia --Memantine 10 mg p.o. twice daily --Delirium precautions --Get up during the day --Encourage a familiar face to remain present throughout the day --Keep blinds open and lights on during daylight hours --Minimize the use of opioids/benzodiazepines    DVT prophylaxis: enoxaparin (LOVENOX) injection 40 mg Start:  03/05/21 2200   Code Status: DNR Family Communication: No family present at bedside this morning  Disposition Plan:   Level of care: Telemetry Status is: Inpatient  Remains inpatient appropriate because: IV antibiotics, continues on oxygen in which he is not dependent at baseline, weakness, debility pending PT/OT evaluation   Consultants:  None  Procedures:  None  Antimicrobials:  Azithromycin 11/8>> Ceftriaxone 11/8>>   Subjective: Patient seen examined bedside, resting comfortably.  Remains in ED holding area.  Pleasantly confused.  No specific complaints this morning.  No family present at bedside.  Denies headache, no chest pain, no abdominal pain, no fever.  No acute events overnight per nursing staff.  Objective: Vitals:   03/06/21 1330 03/06/21 1415 03/06/21 1500 03/06/21 1625  BP: 136/60 129/62 139/65 139/65  Pulse: 66 65 (!) 49 (!) 49  Resp: (!) 24 20 16 16   Temp: 98.7 F (37.1 C) 98.8 F (37.1 C) 98.2 F (36.8 C) 98.5 F (36.9 C)  TempSrc:  Oral Axillary Oral  SpO2: 98% 97% 92% 92%  Weight:   60.7 kg   Height:   5' 10.5" (1.791 m)     Intake/Output Summary (Last 24 hours) at 03/06/2021 1820 Last data filed at 03/06/2021 0229 Gross per 24 hour  Intake 350 ml  Output 850 ml  Net -500 ml   Filed Weights   03/05/21 1546 03/06/21 1500  Weight: 63.5 kg 60.7 kg    Examination:  General exam: Appears calm and comfortable, pleasantly confused Respiratory system: Breath sounds slightly decreased bilateral bases, normal Respaire effort, on 3 L nasal cannula with SPO2 96% at rest Cardiovascular system: S1 & S2 heard, RRR. No JVD, murmurs, rubs, gallops or clicks. No pedal edema. Gastrointestinal system: Abdomen is nondistended, soft and nontender. No organomegaly or masses felt. Normal bowel sounds heard. Central nervous system: Alert, oriented to place [facility]), not person or time. No focal neurological deficits. Extremities: Symmetric 5 x 5 power. Skin: No rashes, lesions or ulcers Psychiatry: Judgement and insight appear poor. Mood & affect appropriate.     Data  Reviewed: I have personally reviewed following labs and imaging studies  CBC: Recent Labs  Lab 03/05/21 1609 03/06/21 0639  WBC 13.4* 14.4*  NEUTROABS 11.2*  --   HGB 11.1* 11.5*  HCT 33.5* 34.4*  MCV 95.2 94.0  PLT 219 938   Basic Metabolic Panel: Recent Labs  Lab 03/05/21 1609  NA 139  K 3.6  CL 103  CO2 27  GLUCOSE 134*  BUN 32*  CREATININE 1.48*  CALCIUM 8.8*   GFR: Estimated Creatinine Clearance: 34.2 mL/min (A) (by C-G formula based on SCr of 1.48 mg/dL (H)). Liver Function Tests: Recent Labs  Lab 03/05/21 1609  AST 53*  ALT 27  ALKPHOS 72  BILITOT 0.9  PROT 7.3  ALBUMIN 3.0*   No results for input(s): LIPASE, AMYLASE in the last 168 hours. No results for input(s): AMMONIA in the last 168 hours. Coagulation Profile: No results for input(s): INR, PROTIME in the last 168 hours. Cardiac Enzymes: No results for input(s): CKTOTAL, CKMB, CKMBINDEX, TROPONINI in the last 168 hours. BNP (last 3 results) Recent Labs    03/26/20 1124  PROBNP 5,675*   HbA1C: No results for input(s): HGBA1C in the last 72 hours. CBG: No results for input(s): GLUCAP in the last 168 hours. Lipid Profile: No results for input(s): CHOL, HDL, LDLCALC, TRIG, CHOLHDL, LDLDIRECT in the last 72 hours. Thyroid Function Tests: No results for input(s): TSH, T4TOTAL, FREET4,  T3FREE, THYROIDAB in the last 72 hours. Anemia Panel: No results for input(s): VITAMINB12, FOLATE, FERRITIN, TIBC, IRON, RETICCTPCT in the last 72 hours. Sepsis Labs: No results for input(s): PROCALCITON, LATICACIDVEN in the last 168 hours.  Recent Results (from the past 240 hour(s))  Resp Panel by RT-PCR (Flu A&B, Covid) Nasopharyngeal Swab     Status: None   Collection Time: 03/05/21  6:17 PM   Specimen: Nasopharyngeal Swab; Nasopharyngeal(NP) swabs in vial transport medium  Result Value Ref Range Status   SARS Coronavirus 2 by RT PCR NEGATIVE NEGATIVE Final    Comment: (NOTE) SARS-CoV-2 target nucleic acids  are NOT DETECTED.  The SARS-CoV-2 RNA is generally detectable in upper respiratory specimens during the acute phase of infection. The lowest concentration of SARS-CoV-2 viral copies this assay can detect is 138 copies/mL. A negative result does not preclude SARS-Cov-2 infection and should not be used as the sole basis for treatment or other patient management decisions. A negative result may occur with  improper specimen collection/handling, submission of specimen other than nasopharyngeal swab, presence of viral mutation(s) within the areas targeted by this assay, and inadequate number of viral copies(<138 copies/mL). A negative result must be combined with clinical observations, patient history, and epidemiological information. The expected result is Negative.  Fact Sheet for Patients:  EntrepreneurPulse.com.au  Fact Sheet for Healthcare Providers:  IncredibleEmployment.be  This test is no t yet approved or cleared by the [place] FDA and  has been authorized for detection and/or diagnosis of SARS-CoV-2 by FDA under an Emergency Use Authorization (EUA). This EUA will remain  in effect (meaning this test can be used) for the duration of the COVID-19 declaration under Section 564(b)(1) of the Act, 21 U.S.C.section 360bbb-3(b)(1), unless the authorization is terminated  or revoked sooner.       Influenza A by PCR NEGATIVE NEGATIVE Final   Influenza B by PCR NEGATIVE NEGATIVE Final    Comment: (NOTE) The Xpert Xpress SARS-CoV-2/FLU/RSV plus assay is intended as an aid in the diagnosis of influenza from Nasopharyngeal swab specimens and should not be used as a sole basis for treatment. Nasal washings and aspirates are unacceptable for Xpert Xpress SARS-CoV-2/FLU/RSV testing.  Fact Sheet for Patients: EntrepreneurPulse.com.au  Fact Sheet for Healthcare Providers: IncredibleEmployment.be  This test is  not yet approved or cleared by the [place] FDA and has been authorized for detection and/or diagnosis of SARS-CoV-2 by FDA under an Emergency Use Authorization (EUA). This EUA will remain in effect (meaning this test can be used) for the duration of the COVID-19 declaration under Section 564(b)(1) of the Act, 21 U.S.C. section 360bbb-3(b)(1), unless the authorization is terminated or revoked.  Performed at [facility], [place] [address], [place], [place] [address]          Radiology Studies: DG Chest 2 View  Result Date: 03/05/2021 CLINICAL DATA:  Shortness of breath, productive cough, fatigue. EXAM: CHEST - 2 VIEW COMPARISON:  Chest radiograph dated December 07, 2020 FINDINGS: The heart is enlarged. Evidence of prior coronary artery bypass grafting. Hyperinflated lungs with bibasilar opacities concerning for atelectasis or infiltrate. Follow-up examination to resolution is recommended IMPRESSION: 1.  Cardiomegaly. 2.  Bibasilar opacities concerning for atelectasis or infiltrate. Electronically Signed   By: Keane Police D.O.   On: 03/05/2021 16:34        Scheduled Meds:  vitamin C  500 mg Oral Daily   aspirin EC  81 mg Oral Daily   cholecalciferol  2,000 Units Oral  Daily   docusate sodium  100 mg Oral Daily   enoxaparin (LOVENOX) injection  40 mg Subcutaneous Q24H   fluticasone furoate-vilanterol  1 puff Inhalation Daily   memantine  10 mg Oral BID   metoprolol succinate  50 mg Oral Daily   And   metoprolol succinate  25 mg Oral QHS   pantoprazole  40 mg Oral Daily   Pirfenidone  267 mg Oral 3 times per day   simvastatin  20 mg Oral q1800   vitamin B-12  1,000 mcg Oral Daily   Continuous Infusions:  azithromycin     cefTRIAXone (ROCEPHIN)  IV       LOS: 1 day    Time spent: 42 minutes spent on chart review, discussion with nursing staff, consultants, updating family and interview/physical exam; more than 50% of that time was spent in counseling  and/or coordination of care.    Fahd Galea J [place] DO [facility] Available via Epic secure chat 7am-7pm After these hours, please refer to coverage provider listed on [email or web address] 03/06/2021, 6:20 PM

## 2021-03-06 NOTE — ED Notes (Signed)
Pt given lunch tray. Family at bedside to assist w feeding

## 2021-03-06 NOTE — Evaluation (Signed)
Physical Therapy Evaluation Patient Details Name: Isaac [name] MRN: [id number] DOB: Apr 03, 1941 Today's Date: 03/06/2021  History of Present Illness  Isaac [name] is a 80 y.o. male presents with generalized weakness, nausea, fatigue and decreased appetite. Pt admitted with Acute hypoxic respiratory failure secondary to community-acquired pneumonia in the setting of pulmonary fibrosis. PMH: CAD, CHF, dementia, HTN, GERD, hyperlipidemia, HTN, MI, CKD, CABG 2002   Clinical Impression  Pt admitted with above diagnosis. Pt's spouse at bedside providing PLOF states pt uses SPC in the home and she "hold onto him" while ambulating in the house, uses electric cart at the grocery store, spouse assists as needed with bathing and toileting, pt able to self dress and feed without physical assist. Pt currently joking when therapist asks questions, pleasant, able to follow commands appropriately. Pt requires min A to steady with mobility, limited to standing marching and sidesteps at EOB with RW, limited by fatigue. Pt on RA with SpO2 >92% throughout eval. Spouse reports plan is for pt to return home "once situated" and agreeable to HHPT with past success from [facility]. Pt currently with functional limitations due to the deficits listed below (see PT Problem List). Pt will benefit from skilled PT to increase their independence and safety with mobility to allow discharge to the venue listed below.          Recommendations for follow up therapy are one component of a multi-disciplinary discharge planning process, led by the attending physician.  Recommendations may be updated based on patient status, additional functional criteria and insurance authorization.  Follow Up Recommendations Home health PT    Assistance Recommended at Discharge Frequent or constant Supervision/Assistance  Functional Status Assessment Patient has had a recent decline in their functional status and demonstrates the ability to make  significant improvements in function in a reasonable and predictable amount of time.  Equipment Recommendations  None recommended by PT    Recommendations for Other Services       Precautions / Restrictions Precautions Precautions: Fall Restrictions Weight Bearing Restrictions: No      Mobility  Bed Mobility Overal bed mobility: Needs Assistance Bed Mobility: Supine to Sit;Sit to Supine  Supine to sit: Min assist Sit to supine: Min assist   General bed mobility comments: min A to upright trunk into sitting while pt mobilizes BLE over to EOB; min A to lift BLE back into bed, increased time    Transfers Overall transfer level: Needs assistance Equipment used: Rolling walker (2 wheels) Transfers: Sit to/from Stand Sit to Stand: Min assist  General transfer comment: min A to steady with powering to stand from gurney, BUE assisting to power up, maintains BLE braced against bed    Ambulation/Gait Ambulation/Gait assistance: Min assist  Assistive device: Rolling walker (2 wheels)  General Gait Details: pt performs standing marching and sidesteps at bedside for ~30 seconds, declines ambulation, dyspnea 3/4 on RA with SpO2 95%  Stairs            Wheelchair Mobility    Modified Rankin (Stroke Patients Only)       Balance Overall balance assessment: Needs assistance Sitting-balance support: Feet supported Sitting balance-Leahy Scale: Good Sitting balance - Comments: seated EOB   Standing balance support: Reliant on assistive device for balance;During functional activity;Bilateral upper extremity supported Standing balance-Leahy Scale: Poor       Pertinent Vitals/Pain Pain Assessment: No/denies pain    Home Living Family/patient expects to be discharged to:: Private residence Living Arrangements: Spouse/significant other Available  Help at Discharge: Family;Available 24 hours/day Type of Home: House Home Access: Ramped entrance       Home Layout: One  level Home Equipment: Shower seat;Grab bars - tub/shower;Cane - single point;Wheelchair - Publishing copy (2 wheels)      Prior Function Prior Level of Function : Needs assist  Physical Assist : Mobility (physical);ADLs (physical) Mobility (physical): Gait ADLs (physical): Bathing;Toileting Mobility Comments: spouse reports pt amb with SPC and her holding onto him in the home, riding electric buggy in grocery store ADLs Comments: spouse reports she assists with bathing and toileting, supv for dressing and ind with feeding     Hand Dominance        Extremity/Trunk Assessment   Upper Extremity Assessment Upper Extremity Assessment: Defer to OT evaluation    Lower Extremity Assessment Lower Extremity Assessment: Generalized weakness (AROM WNL, strength grossly 3+/5, denies numbness/tingling throughout)    Cervical / Trunk Assessment Cervical / Trunk Assessment: Normal  Communication   Communication: HOH  Cognition Arousal/Alertness: Awake/alert Behavior During Therapy: WFL for tasks assessed/performed Overall Cognitive Status: History of cognitive impairments - at baseline  General Comments: pt joking and pleasant during eval, aware of spouse at bedside, states "I'm on a cushion" when asked where he is and unable to state hospital        General Comments General comments (skin integrity, edema, etc.): VSS during eval    Exercises     Assessment/Plan    PT Assessment Patient needs continued PT services  PT Problem List Decreased strength;Decreased activity tolerance;Decreased balance;Decreased mobility;Decreased cognition;Decreased knowledge of use of DME;Cardiopulmonary status limiting activity       PT Treatment Interventions DME instruction;Gait training;Functional mobility training;Therapeutic activities;Therapeutic exercise;Balance training;Neuromuscular re-education;Patient/family education    PT Goals (Current goals can be found in the Care Plan section)   Acute Rehab PT Goals Patient Stated Goal: "return home once situated" PT Goal Formulation: With patient/family Time For Goal Achievement: 03/20/21 Potential to Achieve Goals: Good    Frequency Min 3X/week   Barriers to discharge        Co-evaluation               AM-PAC PT "6 Clicks" Mobility  Outcome Measure Help needed turning from your back to your side while in a flat bed without using bedrails?: A Little Help needed moving from lying on your back to sitting on the side of a flat bed without using bedrails?: A Little Help needed moving to and from a bed to a chair (including a wheelchair)?: A Little Help needed standing up from a chair using your arms (e.g., wheelchair or bedside chair)?: A Little Help needed to walk in hospital room?: A Little Help needed climbing 3-5 steps with a railing? : A Lot 6 Click Score: 17    End of Session   Activity Tolerance: Patient tolerated treatment well;Patient limited by fatigue Patient left: in bed;with call bell/phone within reach;with family/visitor present Nurse Communication: Mobility status PT Visit Diagnosis: Unsteadiness on feet (R26.81);Other abnormalities of gait and mobility (R26.89);Muscle weakness (generalized) (M62.81)    Time: 1131-1150 PT Time Calculation (min) (ACUTE ONLY): 19 min   Charges:   PT Evaluation $PT Eval Low Complexity: 1 Low           Tori Seryna Marek PT, DPT 03/06/21, 1:17 PM

## 2021-03-06 NOTE — ED Notes (Signed)
Pt given breakfast tray

## 2021-03-06 NOTE — ED Notes (Signed)
Message sent to pharmacy about verification and missing dose of Pirfenidone. Pharmacy verified the order and said that it would be sent up in within the hour should it be entered as late.

## 2021-03-07 DIAGNOSIS — J9601 Acute respiratory failure with hypoxia: Secondary | ICD-10-CM | POA: Diagnosis not present

## 2021-03-07 LAB — BASIC METABOLIC PANEL
Anion gap: 14 (ref 5–15)
BUN: 29 mg/dL — ABNORMAL HIGH (ref 8–23)
CO2: 25 mmol/L (ref 22–32)
Calcium: 8.7 mg/dL — ABNORMAL LOW (ref 8.9–10.3)
Chloride: 101 mmol/L (ref 98–111)
Creatinine, Ser: 1.21 mg/dL (ref 0.61–1.24)
GFR, Estimated: >60 mL/min (ref >60)
Glucose, Bld: 114 mg/dL — ABNORMAL HIGH (ref 70–99)
Potassium: 3 mmol/L — ABNORMAL LOW (ref 3.5–5.1)
Sodium: 140 mmol/L (ref 135–145)

## 2021-03-07 LAB — CBC
HCT: 33.2 % — ABNORMAL LOW (ref 39.0–52.0)
Hemoglobin: 10.8 g/dL — ABNORMAL LOW (ref 13.0–17.0)
MCH: 30.9 pg (ref 26.0–34.0)
MCHC: 32.5 g/dL (ref 30.0–36.0)
MCV: 95.1 fL (ref 80.0–100.0)
Platelets: 247 10*3/uL (ref 150–400)
RBC: 3.49 MIL/uL — ABNORMAL LOW (ref 4.22–5.81)
RDW: 13.4 % (ref 11.5–15.5)
WBC: 13.9 10*3/uL — ABNORMAL HIGH (ref 4.0–10.5)
nRBC: 0 % (ref 0.0–0.2)

## 2021-03-07 LAB — MAGNESIUM: Magnesium: 1.6 mg/dL — ABNORMAL LOW (ref 1.7–2.4)

## 2021-03-07 MED ORDER — ENSURE ENLIVE PO LIQD
237.0000 mL | Freq: Three times a day (TID) | ORAL | Status: DC
  Administered 2021-03-07 – 2021-03-14 (×13): 237 mL via ORAL

## 2021-03-07 MED ORDER — MELATONIN 3 MG PO TABS
3.0000 mg | ORAL_TABLET | Freq: Every day | ORAL | Status: DC
  Administered 2021-03-07 – 2021-03-13 (×7): 3 mg via ORAL
  Filled 2021-03-07 (×7): qty 1

## 2021-03-07 MED ORDER — MAGNESIUM SULFATE 2 GM/50ML IV SOLN
2.0000 g | Freq: Once | INTRAVENOUS | Status: AC
  Administered 2021-03-07: 2 g via INTRAVENOUS
  Filled 2021-03-07: qty 50

## 2021-03-07 MED ORDER — POTASSIUM CHLORIDE CRYS ER 20 MEQ PO TBCR
30.0000 meq | EXTENDED_RELEASE_TABLET | ORAL | Status: AC
  Administered 2021-03-07 (×3): 30 meq via ORAL
  Filled 2021-03-07 (×3): qty 1

## 2021-03-07 NOTE — Progress Notes (Signed)
    OVERNIGHT PROGRESS REPORT   80 year old male with past medical history significant for dementia, CAD s/p CABG, chronic diastolic congestive heart failure, pulmonary fibrosis, CKD stage IIIa, TIA, hyperlipidemia, and GERD.  Notified by RN for Agitation and restlessness in spite of methods to alleviate any delirium/confusion as much as possible in -light of his medical history,  a dose of Haldol provided minimal/short duration relief and patient tries to get up frequently with RN in the room sitting, he also removes oxygen device and desaturates quickly with the effort.   Soft restraints are employed loosely with improvement noted to restlessness and oxygenation and will be removed as soon as possible with ongoing evaluation of mental status.      Gershon Cull MSNA MSN ACNPC-AG Acute Care Nurse Practitioner [place]

## 2021-03-07 NOTE — Evaluation (Signed)
Occupational Therapy Evaluation Patient Details Name: Isaac [name] MRN: [id number] DOB: Jun 09, 1940 Today's Date: 03/07/2021   History of Present Illness Isaac [name] is a 80 y.o. male presents with generalized weakness, nausea, fatigue and decreased appetite. Pt admitted with Acute hypoxic respiratory failure secondary to community-acquired pneumonia in the setting of pulmonary fibrosis. PMH: CAD, CHF, dementia, HTN, GERD, hyperlipidemia, HTN, MI, CKD, CABG 2002   Clinical Impression   Pt's wife at bedside providing information on PLOF. PTA, pt was requiring assistance from wife with ADLs, was independent in dressing and feeding with set up, and min guard assistance with functional mobility in house. Upon evaluation, pt with decreased activity tolerance, balance, and requiring 3L of oxygen at rest limiting functional abilities. Pt's wife also reports pt with decreased awareness, orientation, and ability to perform feeding and dressing. Pt currently requires Max A for feeding per nursing report, Max A for LB ADLs, and min A for stand pivots with RW. Patient will benefit from skilled OT services while in hospital to improve deficits and learn compensatory strategies as needed in order to return to PLOF. Pt has good caregiver support with 24 hr assistance from wife. Wife reports working with [facility] in the past and would like to continue Home health post d/c.      Recommendations for follow up therapy are one component of a multi-disciplinary discharge planning process, led by the attending physician.  Recommendations may be updated based on patient status, additional functional criteria and insurance authorization.   Follow Up Recommendations  Home health OT    Assistance Recommended at Discharge Frequent or constant Supervision/Assistance  Functional Status Assessment  Patient has had a recent decline in their functional status and demonstrates the ability to make significant  improvements in function in a reasonable and predictable amount of time.  Equipment Recommendations  Other (comment) (TBD)    Recommendations for Other Services       Precautions / Restrictions Precautions Precautions: Fall Restrictions Weight Bearing Restrictions: No      Mobility Bed Mobility Overal bed mobility: Needs Assistance Bed Mobility: Supine to Sit     Supine to sit: Min assist     General bed mobility comments: Min A to bring LEs off EOB and power up into sitting    Transfers Overall transfer level: Needs assistance Equipment used: Rolling walker (2 wheels) Transfers: Sit to/from Stand Sit to Stand: Min assist           General transfer comment: min A to power up from bed in low surface      Balance Overall balance assessment: Needs assistance Sitting-balance support: Feet supported Sitting balance-Leahy Scale: Good Sitting balance - Comments: seated EOB   Standing balance support: Reliant on assistive device for balance;During functional activity;Bilateral upper extremity supported Standing balance-Leahy Scale: Poor                             ADL either performed or assessed with clinical judgement   ADL Overall ADL's : Needs assistance/impaired Eating/Feeding: Sitting;Maximal assistance Eating/Feeding Details (indicate cue type and reason): RN reports pt could drink out of cup, but she had to feed him this morning. Grooming: Moderate assistance;Sitting   Upper Body Bathing: Moderate assistance;Sitting   Lower Body Bathing: Sit to/from stand;Maximal assistance   Upper Body Dressing : Moderate assistance;Sitting   Lower Body Dressing: Maximal assistance;Sit to/from stand   Toilet Transfer: Rolling walker (2 wheels);Minimal assistance  Toileting- Clothing Manipulation and Hygiene: Maximal assistance;Sit to/from stand       Functional mobility during ADLs: Moderate assistance;Rolling walker (2 wheels)       Vision  Patient Visual Report: No change from baseline       Perception     Praxis      Pertinent Vitals/Pain Pain Assessment: No/denies pain     Hand Dominance     Extremity/Trunk Assessment Upper Extremity Assessment Upper Extremity Assessment: Difficult to assess due to impaired cognition   Lower Extremity Assessment Lower Extremity Assessment: Defer to PT evaluation   Cervical / Trunk Assessment Cervical / Trunk Assessment: Normal   Communication Communication Communication: HOH   Cognition Arousal/Alertness: Suspect due to medications Behavior During Therapy: WFL for tasks assessed/performed Overall Cognitive Status: Impaired/Different from baseline                                 General Comments: Pt with cognitive impairment at baseline, however, wife reports his cognition/orientation has decreased since hospital. Pt unable to answer orientation questions at this time and reports "hes on cushion" when asked where he was. can follow simple commands with increased time and inconsistent.     General Comments       Exercises     Shoulder Instructions      Home Living Family/patient expects to be discharged to:: Private residence Living Arrangements: Spouse/significant other Available Help at Discharge: Family;Available 24 hours/day Type of Home: House Home Access: Ramped entrance     Home Layout: One level     Bathroom Shower/Tub: Tub/shower unit         Home Equipment: Shower seat;Grab bars - tub/shower;Cane - single point;Wheelchair - Publishing copy (2 wheels)          Prior Functioning/Environment Prior Level of Function : Needs assist       Physical Assist : Mobility (physical);ADLs (physical) Mobility (physical): Gait ADLs (physical): Bathing;Toileting Mobility Comments: spouse reports pt amb with SPC and her holding onto him in the home, riding electric buggy in grocery store ADLs Comments: spouse reports she assists with  bathing and toileting, supv for dressing and ind with feeding        OT Problem List: Decreased strength;Decreased knowledge of use of DME or AE;Decreased safety awareness;Decreased knowledge of precautions;Decreased activity tolerance;Impaired balance (sitting and/or standing)      OT Treatment/Interventions: Self-care/ADL training;Therapeutic exercise;DME and/or AE instruction;Therapeutic activities;Patient/family education;Cognitive remediation/compensation;Balance training    OT Goals(Current goals can be found in the care plan section) Acute Rehab OT Goals Patient Stated Goal: none stated OT Goal Formulation: With patient/family Time For Goal Achievement: 03/21/21 Potential to Achieve Goals: Good  OT Frequency: Min 2X/week   Barriers to D/C:            Co-evaluation              AM-PAC OT "6 Clicks" Daily Activity     Outcome Measure Help from another person eating meals?: A Lot Help from another person taking care of personal grooming?: A Lot Help from another person toileting, which includes using toliet, bedpan, or urinal?: A Lot Help from another person bathing (including washing, rinsing, drying)?: A Lot Help from another person to put on and taking off regular upper body clothing?: A Lot Help from another person to put on and taking off regular lower body clothing?: A Lot 6 Click Score: 12   End of Session Equipment  Utilized During Treatment: Rolling walker (2 wheels);Oxygen Nurse Communication: Mobility status;Other (comment) (requested assistance for changing linens.)  Activity Tolerance: Patient limited by lethargy Patient left: in chair;with call bell/phone within reach;with chair alarm set;with nursing/sitter in room;with family/visitor present  OT Visit Diagnosis: Unsteadiness on feet (R26.81);Muscle weakness (generalized) (M62.81)                Time: 7199-4129 OT Time Calculation (min): 31 min Charges:  OT General Charges $OT Visit: 1 Visit OT  Evaluation $OT Eval Low Complexity: 1 Low OT Treatments $Therapeutic Activity: 8-22 mins  Jackquline Denmark, OTS Acute Rehab Office: [phone]   Vernella Niznik 03/07/2021, 4:52 PM

## 2021-03-07 NOTE — Progress Notes (Signed)
Initial Nutrition Assessment  DOCUMENTATION CODES:   Not applicable  INTERVENTION:   Ensure Enlive po TID, each supplement provides 350 kcal and 20 grams of protein  Magic cup TID with meals, each supplement provides 290 kcal and 9 grams of protein  MVI po daily   Liberalize diet   Pt at high refeed risk; recommend monitor potassium, magnesium and phosphorus labs daily until stable  NUTRITION DIAGNOSIS:   Inadequate oral intake related to acute illness as evidenced by per patient/family report.  GOAL:   Patient will meet greater than or equal to 90% of their needs  MONITOR:   PO intake, Supplement acceptance, Labs, Weight trends, Skin, I & O's  REASON FOR ASSESSMENT:   Malnutrition Screening Tool    ASSESSMENT:   80 year old male with past medical history significant for dementia, CAD s/p CABG, chronic diastolic congestive heart failure, pulmonary fibrosis, CKD stage IIIa, TIA, hyperlipidemia, GERD, barretts esophagus, BPH and hiatal hernia who is admitted with CAP.  RD working remotely.  Unable to speak with pt r/t dementia. Per chart review, family reports pt with poor appetite and oral intake for the past 2 weeks pta. Pt with poor appetite and oral intake in hospital. RD will add supplements and MVI to help pt meet his estimated needs. RD will also liberalize pt's diet. Pt is at high refeed risk. Per chart, pt is down 21lbs(13%) over the past 8 months; this is significant. Pt is at high risk for malnutrition. RD will obtain nutrition related exam at follow up.   Medications reviewed and include: vitamin C, aspirin, vitamin D, lovenox, melatonin, protonix, B12, azithromycin, ceftriaxone   Labs reviewed: K 3.0(L), BUN 29(H), Mg 1.6(L) Wbc- 13.9(H), Hgb 10.8(L), Hct 33.2(L)  NUTRITION - FOCUSED PHYSICAL EXAM: Unable to perform at this time   Diet Order:   Diet Order             Diet Heart Room service appropriate? Yes; Fluid consistency: Thin  Diet effective now                   EDUCATION NEEDS:   No education needs have been identified at this time  Skin:  Skin Assessment: Reviewed RN Assessment (ecchymosis)  Last BM:  pta  Height:   Ht Readings from Last 1 Encounters:  03/06/21 5' 10.5" (1.791 m)    Weight:   Wt Readings from Last 1 Encounters:  03/07/21 64.2 kg    Ideal Body Weight:  78 kg  BMI:  Body mass index is 20.02 kg/m.  Estimated Nutritional Needs:   Kcal:  1800-2100kcal/day  Protein:  90-105g/day  Fluid:  1.6-1.9L/day  Koleen Distance MS, RD, LDN Please refer to [facility] for RD and/or RD on-call/weekend/after hours pager

## 2021-03-07 NOTE — Progress Notes (Signed)
PROGRESS NOTE    LOYDE ORTH  SEG:[id number] DOB: Jul 27, 1940 DOA: 03/05/2021 PCP: Dettinger, Fransisca Kaufmann, MD    Brief Narrative:  Isaac [name] is an 80 year old male with past medical history significant for dementia, CAD s/p CABG, chronic diastolic congestive heart failure, pulmonary fibrosis, CKD stage IIIa, TIA, hyperlipidemia, GERD who presents to [facility] ED 11/8 from home with weakness, decreased oral intake, shortness of breath and cough.  Spouse present at bedside provides history given his underlying dementia and altered mental status.  She reports that patient has been sick for the last 2 weeks with decreased oral intake and now with inability to ambulate.  At baseline walks with a cane.  Cough is productive of green sputum with some increased respiratory effort.  No complaints of pain, no nausea/vomiting/diarrhea.  No lower extremity edema.  In the ED, temperature 98.7 F, HR 76, RR 19, BP 143/71, SPO2 88% and placed on 3 L nasal cannula.  Sodium 139, potassium 3.6, chloride 103, CO2 27, glucose 134, BUN 32, creatinine 1.48, AST 53, ALT 27, BNP 662.4.  High sensitive troponin 410> 384.  WBC 13.4, hemoglobin 11.1, platelets 219.  Covid-19 PCR negative.  Influenza A/B PCR negative.  Urinalysis unrevealing.  Chest x-ray with cardiomegaly, bibasilar opacities concerning for atelectasis versus infiltrate.  EKG personally reviewed with NSR, rate 73, QTc 471, no concerning dynamic changes.  EDP started IV Rocephin/azithromycin.  Hospitalist service consulted for further evaluation management of acute metabolic encephalopathy, weakness secondary to community acquired pneumonia.   Assessment & Plan:   Principal Problem:   Acute respiratory failure with hypoxia (HCC) Active Problems:   Hyperlipidemia LDL goal <70   CAD (coronary artery disease), native coronary artery - LIMA-LAD, SVG-Diag, SVG-RCA 2002   Essential hypertension   Chronic diastolic CHF (congestive heart failure) (HCC)   Acute  hypoxic respiratory failure, POA Community acquired pneumonia Patient presenting to ED with 2-week history of progressive weakness, productive cough with green sputum.  Elevated white blood cell count of 14.4 with chest x-ray findings consistent with pneumonia.  Patient is known to be hypoxic with SPO2 88% on room air requiring 3 L nasal cannula to maintain adequate oxygenation.  Suspect etiology atypical versus gram-negative organism. --Azithromycin 500 mg IV q24h x5 days --Ceftriaxone 1 g IV every 24 hours x5 days --Continue supplemental oxygen, maintain SPO2 greater than 88%, on 3 L nasal cannula with SPO2 88% at rest; wean to room air as able --Repeat CXR in am  Elevated troponin High sensitive troponin peaked at 410 followed by 384.  EKG with no concerning dynamic changes.  Etiology likely secondary to type II demand ischemia in the setting of pneumonia as above. --Continue to monitor on telemetry  Essential hypertension Chronic diastolic congestive heart failure, compensated Home regimen includes metoprolol succinate 50 mg p.o. every morning, 25 mg p.o. nightly, furosemide 40 mg as needed for fluid/edema. --Metoprolol succinate 50 mg PO qAm and 25mg  PO qPM --Holding home furosemide --Strict I's and O's and daily weights  CAD s/p CABG --Continue aspirin and statin  Hyperlipidemia: Simvastatin 20 mg p.o. daily  Pulmonary fibrosis --Continue home Pirfenidone 267 mg TID  GERD On omeprazole 20 mg p.o. daily at home. --Continue Protonix 40 mg p.o. daily as hospital substitution  Dementia --Memantine 10 mg p.o. twice daily --melatonin 3mg  PO qHS --Delirium precautions --Get up during the day --Encourage a familiar face to remain present throughout the day --Keep blinds open and lights on during daylight hours --Minimize the use of  opioids/benzodiazepines  Weakness/debility/deconditioning: --PT recommends home health on discharge --OT evaluation pending --Continue therapy  efforts while inpatient    DVT prophylaxis: enoxaparin (LOVENOX) injection 40 mg Start: 03/05/21 2200   Code Status: DNR Family Communication: No family present at bedside this morning, updated patient spouse [name] via telephone this afternoon  Disposition Plan:  Level of care: Telemetry Status is: Inpatient  Remains inpatient appropriate because: IV antibiotics, continues on oxygen in which he is not dependent at baseline, weakness, debility pending OT evaluation   Consultants:  None  Procedures:  None  Antimicrobials:  Azithromycin 11/8>> Ceftriaxone 11/8>>   Subjective: Patient seen examined bedside, resting comfortably.  Sleeping but arousable.  Overnight, patient with increased confusion, trying to get out of bed and was placed in soft wrist restraints and given dose of IV Haldol.  Patient without complaint this morning.  States he is not hungry.  No family present at bedside this morning.  Denies headache, no chest pain, no abdominal pain, no fever.  No other acute events overnight per nursing staff.  Objective: Vitals:   03/07/21 0500 03/07/21 0639 03/07/21 0649 03/07/21 1207  BP:  137/63  115/64  Pulse:  75  71  Resp:  16  19  Temp:  98.2 F (36.8 C)  97.6 F (36.4 C)  TempSrc:  Oral  Oral  SpO2:  (!) 78% (!) 88% 100%  Weight: 64.2 kg     Height:        Intake/Output Summary (Last 24 hours) at 03/07/2021 1359 Last data filed at 03/07/2021 0301 Gross per 24 hour  Intake 500 ml  Output --  Net 500 ml   Filed Weights   03/05/21 1546 03/06/21 1500 03/07/21 0500  Weight: 63.5 kg 60.7 kg 64.2 kg    Examination:  General exam: Appears calm and comfortable, pleasantly confused Respiratory system: Breath sounds slightly decreased bilateral bases, normal respiratory effort, on 3 L nasal cannula with SPO2 96% at rest Cardiovascular system: S1 & S2 heard, RRR. No JVD, murmurs, rubs, gallops or clicks. No pedal edema. Gastrointestinal system: Abdomen is  nondistended, soft and nontender. No organomegaly or masses felt. Normal bowel sounds heard. Central nervous system: Alert, oriented to place [facility]), not person or time. No focal neurological deficits. Extremities: Symmetric 5 x 5 power. Skin: No rashes, lesions or ulcers Psychiatry: Judgement and insight appear poor. Mood & affect appropriate.     Data Reviewed: I have personally reviewed following labs and imaging studies  CBC: Recent Labs  Lab 03/05/21 1609 03/06/21 0639 03/07/21 0349  WBC 13.4* 14.4* 13.9*  NEUTROABS 11.2*  --   --   HGB 11.1* 11.5* 10.8*  HCT 33.5* 34.4* 33.2*  MCV 95.2 94.0 95.1  PLT 219 280 462   Basic Metabolic Panel: Recent Labs  Lab 03/05/21 1609 03/07/21 0349  NA 139 140  K 3.6 3.0*  CL 103 101  CO2 27 25  GLUCOSE 134* 114*  BUN 32* 29*  CREATININE 1.48* 1.21  CALCIUM 8.8* 8.7*  MG  --  1.6*   GFR: Estimated Creatinine Clearance: 44.2 mL/min (by C-G formula based on SCr of 1.21 mg/dL). Liver Function Tests: Recent Labs  Lab 03/05/21 1609  AST 53*  ALT 27  ALKPHOS 72  BILITOT 0.9  PROT 7.3  ALBUMIN 3.0*   No results for input(s): LIPASE, AMYLASE in the last 168 hours. No results for input(s): AMMONIA in the last 168 hours. Coagulation Profile: No results for input(s): INR, PROTIME in the last  168 hours. Cardiac Enzymes: No results for input(s): CKTOTAL, CKMB, CKMBINDEX, TROPONINI in the last 168 hours. BNP (last 3 results) Recent Labs    03/26/20 1124  PROBNP 5,675*   HbA1C: No results for input(s): HGBA1C in the last 72 hours. CBG: No results for input(s): GLUCAP in the last 168 hours. Lipid Profile: No results for input(s): CHOL, HDL, LDLCALC, TRIG, CHOLHDL, LDLDIRECT in the last 72 hours. Thyroid Function Tests: No results for input(s): TSH, T4TOTAL, FREET4, T3FREE, THYROIDAB in the last 72 hours. Anemia Panel: No results for input(s): VITAMINB12, FOLATE, FERRITIN, TIBC, IRON, RETICCTPCT in the last 72  hours. Sepsis Labs: No results for input(s): PROCALCITON, LATICACIDVEN in the last 168 hours.  Recent Results (from the past 240 hour(s))  Resp Panel by RT-PCR (Flu A&B, Covid) Nasopharyngeal Swab     Status: None   Collection Time: 03/05/21  6:17 PM   Specimen: Nasopharyngeal Swab; Nasopharyngeal(NP) swabs in vial transport medium  Result Value Ref Range Status   SARS Coronavirus 2 by RT PCR NEGATIVE NEGATIVE Final    Comment: (NOTE) SARS-CoV-2 target nucleic acids are NOT DETECTED.  The SARS-CoV-2 RNA is generally detectable in upper respiratory specimens during the acute phase of infection. The lowest concentration of SARS-CoV-2 viral copies this assay can detect is 138 copies/mL. A negative result does not preclude SARS-Cov-2 infection and should not be used as the sole basis for treatment or other patient management decisions. A negative result may occur with  improper specimen collection/handling, submission of specimen other than nasopharyngeal swab, presence of viral mutation(s) within the areas targeted by this assay, and inadequate number of viral copies(<138 copies/mL). A negative result must be combined with clinical observations, patient history, and epidemiological information. The expected result is Negative.  Fact Sheet for Patients:  EntrepreneurPulse.com.au  Fact Sheet for Healthcare Providers:  IncredibleEmployment.be  This test is no t yet approved or cleared by the [place] FDA and  has been authorized for detection and/or diagnosis of SARS-CoV-2 by FDA under an Emergency Use Authorization (EUA). This EUA will remain  in effect (meaning this test can be used) for the duration of the COVID-19 declaration under Section 564(b)(1) of the Act, 21 U.S.C.section 360bbb-3(b)(1), unless the authorization is terminated  or revoked sooner.       Influenza A by PCR NEGATIVE NEGATIVE Final   Influenza B by PCR NEGATIVE  NEGATIVE Final    Comment: (NOTE) The Xpert Xpress SARS-CoV-2/FLU/RSV plus assay is intended as an aid in the diagnosis of influenza from Nasopharyngeal swab specimens and should not be used as a sole basis for treatment. Nasal washings and aspirates are unacceptable for Xpert Xpress SARS-CoV-2/FLU/RSV testing.  Fact Sheet for Patients: EntrepreneurPulse.com.au  Fact Sheet for Healthcare Providers: IncredibleEmployment.be  This test is not yet approved or cleared by the [place] FDA and has been authorized for detection and/or diagnosis of SARS-CoV-2 by FDA under an Emergency Use Authorization (EUA). This EUA will remain in effect (meaning this test can be used) for the duration of the COVID-19 declaration under Section 564(b)(1) of the Act, 21 U.S.C. section 360bbb-3(b)(1), unless the authorization is terminated or revoked.  Performed at [facility], [place] [address] [place], [place] [address]          Radiology Studies: DG Chest 2 View  Result Date: 03/05/2021 CLINICAL DATA:  Shortness of breath, productive cough, fatigue. EXAM: CHEST - 2 VIEW COMPARISON:  Chest radiograph dated December 07, 2020 FINDINGS: The heart is enlarged. Evidence  of prior coronary artery bypass grafting. Hyperinflated lungs with bibasilar opacities concerning for atelectasis or infiltrate. Follow-up examination to resolution is recommended IMPRESSION: 1.  Cardiomegaly. 2.  Bibasilar opacities concerning for atelectasis or infiltrate. Electronically Signed   By: Keane Police D.O.   On: 03/05/2021 16:34        Scheduled Meds:  vitamin C  500 mg Oral Daily   aspirin EC  81 mg Oral Daily   cholecalciferol  2,000 Units Oral Daily   docusate sodium  100 mg Oral Daily   enoxaparin (LOVENOX) injection  40 mg Subcutaneous Q24H   feeding supplement  237 mL Oral BID BM   fluticasone furoate-vilanterol  1 puff Inhalation Daily   melatonin  3 mg  Oral QHS   memantine  10 mg Oral BID   metoprolol succinate  50 mg Oral Daily   And   metoprolol succinate  25 mg Oral QHS   pantoprazole  40 mg Oral Daily   Pirfenidone  267 mg Oral 3 times per day   potassium chloride  30 mEq Oral Q4H   simvastatin  20 mg Oral q1800   vitamin B-12  1,000 mcg Oral Daily   Continuous Infusions:  azithromycin 500 mg (03/06/21 2014)   cefTRIAXone (ROCEPHIN)  IV 1 g (03/06/21 2124)     LOS: 2 days    Time spent: 42 minutes spent on chart review, discussion with nursing staff, consultants, updating family and interview/physical exam; more than 50% of that time was spent in counseling and/or coordination of care.    Vinetta Brach J [place] DO [facility] Available via Epic secure chat 7am-7pm After these hours, please refer to coverage provider listed on [email or web address] 03/07/2021, 1:59 PM

## 2021-03-07 NOTE — Progress Notes (Signed)
Pt home med, pirfenidone, taken to pharmacy.  Pharmacy to dispense when contacted for needed dose.

## 2021-03-08 ENCOUNTER — Inpatient Hospital Stay (HOSPITAL_COMMUNITY): Payer: Medicare Other

## 2021-03-08 DIAGNOSIS — J9601 Acute respiratory failure with hypoxia: Secondary | ICD-10-CM | POA: Diagnosis not present

## 2021-03-08 LAB — CBC
HCT: 30.8 % — ABNORMAL LOW (ref 39.0–52.0)
Hemoglobin: 10.3 g/dL — ABNORMAL LOW (ref 13.0–17.0)
MCH: 31.1 pg (ref 26.0–34.0)
MCHC: 33.4 g/dL (ref 30.0–36.0)
MCV: 93.1 fL (ref 80.0–100.0)
Platelets: 266 10*3/uL (ref 150–400)
RBC: 3.31 MIL/uL — ABNORMAL LOW (ref 4.22–5.81)
RDW: 13.4 % (ref 11.5–15.5)
WBC: 12.4 10*3/uL — ABNORMAL HIGH (ref 4.0–10.5)
nRBC: 0 % (ref 0.0–0.2)

## 2021-03-08 LAB — BASIC METABOLIC PANEL
Anion gap: 9 (ref 5–15)
BUN: 31 mg/dL — ABNORMAL HIGH (ref 8–23)
CO2: 26 mmol/L (ref 22–32)
Calcium: 8.6 mg/dL — ABNORMAL LOW (ref 8.9–10.3)
Chloride: 104 mmol/L (ref 98–111)
Creatinine, Ser: 1.07 mg/dL (ref 0.61–1.24)
GFR, Estimated: >60 mL/min (ref >60)
Glucose, Bld: 139 mg/dL — ABNORMAL HIGH (ref 70–99)
Potassium: 3.8 mmol/L (ref 3.5–5.1)
Sodium: 139 mmol/L (ref 135–145)

## 2021-03-08 LAB — BRAIN NATRIURETIC PEPTIDE: B Natriuretic Peptide: 891.8 pg/mL — ABNORMAL HIGH (ref 0.0–100.0)

## 2021-03-08 LAB — MAGNESIUM: Magnesium: 2.2 mg/dL (ref 1.7–2.4)

## 2021-03-08 LAB — PHOSPHORUS: Phosphorus: 2.6 mg/dL (ref 2.5–4.6)

## 2021-03-08 MED ORDER — FUROSEMIDE 10 MG/ML IJ SOLN
20.0000 mg | Freq: Every day | INTRAMUSCULAR | Status: DC
Start: 2021-03-08 — End: 2021-03-09
  Administered 2021-03-08: 20 mg via INTRAVENOUS
  Filled 2021-03-08: qty 2

## 2021-03-08 MED ORDER — AZITHROMYCIN 250 MG PO TABS
500.0000 mg | ORAL_TABLET | Freq: Once | ORAL | Status: AC
  Administered 2021-03-09: 500 mg via ORAL
  Filled 2021-03-08: qty 2

## 2021-03-08 NOTE — Progress Notes (Signed)
Physical Therapy Treatment Patient Details Name: Isaac [name] MRN: [id number] DOB: Jun 24, 1940 Today's Date: 03/08/2021   History of Present Illness Isaac [name] is a 80 y.o. male presents with generalized weakness, nausea, fatigue and decreased appetite. Pt admitted with Acute hypoxic respiratory failure secondary to community-acquired pneumonia in the setting of pulmonary fibrosis. PMH: CAD, CHF, dementia, HTN, GERD, hyperlipidemia, HTN, MI, CKD, CABG 2002    PT Comments    Pt familiar to this therapist from eval, more lethargic this session, maintains eyes closed unless cued and difficulty maintaining opened. Pt able to come to EOB with heavy cues and increased time, verbalizes want to ambulate. PT hopeful that pt with hall increased alertness with upright sitting and standing, but pt requires +2 to stand EOB and verbal cues to maintain feet on floor with tactile cues for hand placement. Pt somewhat more alert once standing, but continues to close eyes while talking. Pt requires heavy assist with RW management and positioning closer to RW, cues for sequencing, and generally unsteady requiring mod A+2 to ambulate 12 ft with RW. Pt more lethargic this session and requiring significantly more assistance, unsure if due to medications, lack of sleep or cognition; updated recommendation to SNF due to +2 assist- no spouse present during treatment session today. Will continue to progress acute PT as able.    Recommendations for follow up therapy are one component of a multi-disciplinary discharge planning process, led by the attending physician.  Recommendations may be updated based on patient status, additional functional criteria and insurance authorization.  Follow Up Recommendations  Skilled nursing-short term rehab (<3 hours/day)     Assistance Recommended at Discharge Frequent or constant Supervision/Assistance  Equipment Recommendations  None recommended by PT    Recommendations for Other  Services       Precautions / Restrictions Precautions Precautions: Fall Precaution Comments: monitor O2 Restrictions Weight Bearing Restrictions: No     Mobility  Bed Mobility Overal bed mobility: Needs Assistance Bed Mobility: Supine to Sit;Sit to Supine  Supine to sit: Min assist Sit to supine: Min assist  General bed mobility comments: min A to light trunk into sitting while slowly mobilizing BLE to EOB, min A to lift BLE back into bed    Transfers Overall transfer level: Needs assistance Equipment used: Rolling walker (2 wheels) Transfers: Sit to/from Stand Sit to Stand: Min assist;+2 safety/equipment  General transfer comment: min A +2 for safety, max verbal cues for foot placement and powering to stand, tactile cues with hand placement to improve safety    Ambulation/Gait Ambulation/Gait assistance: Mod assist;+2 physical assistance;+2 safety/equipment Gait Distance (Feet): 12 Feet Assistive device: Rolling walker (2 wheels) Gait Pattern/deviations: Step-to pattern;Decreased stride length;Trunk flexed;Narrow base of support Gait velocity: decreased  General Gait Details: modA+2 with ambulation, heavy assist with RW management and positioning body within RW frame, very narrow BOS with generally unsteady gait, frequent cues to open eyes and attend to task while pt talking about frying fish and going to church   Stairs             Wheelchair Mobility    Modified Rankin (Stroke Patients Only)       Balance Overall balance assessment: Needs assistance Sitting-balance support: Feet supported Sitting balance-Leahy Scale: Fair Sitting balance - Comments: seated EOB   Standing balance support: Reliant on assistive device for balance;During functional activity;Bilateral upper extremity supported Standing balance-Leahy Scale: Poor     Cognition Arousal/Alertness: Lethargic Behavior During Therapy: Flat affect Overall Cognitive Status:  Impaired/Different from  baseline  General Comments: pt more lethargic today compared to eval, difficulty maintaining eyes opened, reports being in church despite reorientation, follows commands with increased time and cues- RN in room aware of decreased alertness, possibly due to lack of sleep at night        Exercises      General Comments General comments (skin integrity, edema, etc.): Pt on RA with SpO2 97% upon arrival while supine in bed, 90% after return to sitting EOB, 93% once returned to sitting- RN in room aware      Pertinent Vitals/Pain Pain Assessment: No/denies pain Breathing: normal Negative Vocalization: none Facial Expression: smiling or inexpressive Body Language: relaxed Consolability: no need to console PAINAD Score: 0    Home Living                          Prior Function            PT Goals (current goals can now be found in the care plan section) Acute Rehab PT Goals Patient Stated Goal: "return home once situated" PT Goal Formulation: With patient/family Time For Goal Achievement: 03/20/21 Potential to Achieve Goals: Good Progress towards PT goals: Progressing toward goals (more lethargic and distracted today)    Frequency    Min 3X/week      PT Plan Discharge plan needs to be updated    Co-evaluation              AM-PAC PT "6 Clicks" Mobility   Outcome Measure  Help needed turning from your back to your side while in a flat bed without using bedrails?: A Little Help needed moving from lying on your back to sitting on the side of a flat bed without using bedrails?: A Little Help needed moving to and from a bed to a chair (including a wheelchair)?: Total Help needed standing up from a chair using your arms (e.g., wheelchair or bedside chair)?: Total Help needed to walk in hospital room?: Total Help needed climbing 3-5 steps with a railing? : Total 6 Click Score: 10    End of Session Equipment Utilized During Treatment: Gait belt Activity  Tolerance: Patient limited by lethargy Patient left: in bed;with call bell/phone within reach;with bed alarm set;with nursing/sitter in room Nurse Communication: Mobility status;Other (comment) (SpO2) PT Visit Diagnosis: Unsteadiness on feet (R26.81);Other abnormalities of gait and mobility (R26.89);Muscle weakness (generalized) (M62.81)     Time: 3295-1884 PT Time Calculation (min) (ACUTE ONLY): 19 min  Charges:  $Gait Training: 8-22 mins                      Tori Olly Shiner PT, DPT 03/08/21, 2:11 PM

## 2021-03-08 NOTE — Progress Notes (Signed)
PROGRESS NOTE    Isaac [name]  ZJI:[id number] DOB: 06-Aug-1940 DOA: 03/05/2021 PCP: Dettinger, Fransisca Kaufmann, MD    Brief Narrative:  Isaac [name] is an 80 year old male with past medical history significant for dementia, CAD s/p CABG, chronic diastolic congestive heart failure, pulmonary fibrosis, CKD stage IIIa, TIA, hyperlipidemia, GERD who presents to [facility] ED 11/8 from home with weakness, decreased oral intake, shortness of breath and cough.  Spouse present at bedside provides history given his underlying dementia and altered mental status.  She reports that patient has been sick for the last 2 weeks with decreased oral intake and now with inability to ambulate.  At baseline walks with a cane.  Cough is productive of green sputum with some increased respiratory effort.  No complaints of pain, no nausea/vomiting/diarrhea.  No lower extremity edema.  In the ED, temperature 98.7 F, HR 76, RR 19, BP 143/71, SPO2 88% and placed on 3 L nasal cannula.  Sodium 139, potassium 3.6, chloride 103, CO2 27, glucose 134, BUN 32, creatinine 1.48, AST 53, ALT 27, BNP 662.4.  High sensitive troponin 410> 384.  WBC 13.4, hemoglobin 11.1, platelets 219.  Covid-19 PCR negative.  Influenza A/B PCR negative.  Urinalysis unrevealing.  Chest x-ray with cardiomegaly, bibasilar opacities concerning for atelectasis versus infiltrate.  EKG personally reviewed with NSR, rate 73, QTc 471, no concerning dynamic changes.  EDP started IV Rocephin/azithromycin.  Hospitalist service consulted for further evaluation management of acute metabolic encephalopathy, weakness secondary to community acquired pneumonia.   Assessment & Plan:   Principal Problem:   Acute respiratory failure with hypoxia (HCC) Active Problems:   Hyperlipidemia LDL goal <70   CAD (coronary artery disease), native coronary artery - LIMA-LAD, SVG-Diag, SVG-RCA 2002   Essential hypertension   Chronic diastolic CHF (congestive heart failure) (HCC)   Acute  hypoxic respiratory failure, POA Community acquired pneumonia Patient presenting to ED with 2-week history of progressive weakness, productive cough with green sputum.  Elevated white blood cell count of 14.4 with chest x-ray findings consistent with pneumonia.  Patient is known to be hypoxic with SPO2 88% on room air requiring 3 L nasal cannula to maintain adequate oxygenation.  Suspect etiology atypical versus gram-negative organism. --Azithromycin 500 mg IV q24h x 5 days --Ceftriaxone 1 g IV every 24 hours x 5 days --Continue supplemental oxygen, maintain SPO2 greater than 88%, on 2 L nasal cannula with SPO2 97% at rest; wean to room air as able --Ambulatory O2 screen tomorrow  Elevated troponin High sensitive troponin peaked at 410 followed by 384.  EKG with no concerning dynamic changes.  Etiology likely secondary to type II demand ischemia in the setting of pneumonia as above. --Continue to monitor on telemetry  Essential hypertension Chronic diastolic congestive heart failure, compensated Home regimen includes metoprolol succinate 50 mg p.o. qAM, 25 mg p.o. qHS, furosemide 40 mg as needed for fluid/edema. --Metoprolol succinate 50 mg PO qAm and 25mg  PO qPM --Lasix 20 mg IV every 24 hours --Strict I's and O's and daily weights  CAD s/p CABG --Continue aspirin and statin  Hyperlipidemia: Simvastatin 20 mg p.o. daily  Pulmonary fibrosis --Continue home Pirfenidone 267 mg TID  GERD On omeprazole 20 mg p.o. daily at home. --Continue Protonix 40 mg p.o. daily as hospital substitution  Dementia --Memantine 10 mg p.o. twice daily --melatonin 3mg  PO qHS --Delirium precautions --Get up during the day --Encourage a familiar face to remain present throughout the day --Keep blinds open and lights on during daylight  hours --Minimize the use of opioids/benzodiazepines  Weakness/debility/deconditioning: --PT/OT recommends home health on discharge --Continue therapy efforts while  inpatient    DVT prophylaxis: enoxaparin (LOVENOX) injection 40 mg Start: 03/05/21 2200   Code Status: DNR Family Communication: Updated patient's sister was present at bedside this morning Disposition Plan: Home with home health Level of care: Telemetry Status is: Inpatient  Remains inpatient appropriate because: IV antibiotics, attempting to wean oxygen this morning   Consultants:  None  Procedures:  None  Antimicrobials:  Azithromycin 11/8>> Ceftriaxone 11/8>>   Subjective: Patient seen examined bedside, resting comfortably.  Sleeping but arousable.  Patient again required restraints overnight for trying to get out of the bed due to sundowning.  Sister present at bedside and updated.  Weaning to room air this morning.  RT present at bedside.  Patient with no complaints but remains pleasantly confused.  Denies headache, no chest pain, no abdominal pain, no fever.  No other acute events overnight per nursing staff.  Objective: Vitals:   03/08/21 0500 03/08/21 0529 03/08/21 0847 03/08/21 1100  BP:  (!) 141/73    Pulse:  70    Resp:  20    Temp:  97.6 F (36.4 C)    TempSrc:  Oral    SpO2:  97% 95% 94%  Weight: 64.7 kg     Height:        Intake/Output Summary (Last 24 hours) at 03/08/2021 1226 Last data filed at 03/08/2021 1022 Gross per 24 hour  Intake 1426.73 ml  Output 320 ml  Net 1106.73 ml   Filed Weights   03/06/21 1500 03/07/21 0500 03/08/21 0500  Weight: 60.7 kg 64.2 kg 64.7 kg    Examination:  General exam: Appears calm and comfortable, pleasantly confused Respiratory system: Breath sounds slightly decreased bilateral bases, normal respiratory effort, on 2 L nasal cannula with SPO2 97% at rest Cardiovascular system: S1 & S2 heard, RRR. No JVD, murmurs, rubs, gallops or clicks. No pedal edema. Gastrointestinal system: Abdomen is nondistended, soft and nontender. No organomegaly or masses felt. Normal bowel sounds heard. Central nervous system: Alert,  oriented to time (2022) but not place [facility] area), not person. No focal neurological deficits. Extremities: Symmetric 5 x 5 power. Skin: No rashes, lesions or ulcers Psychiatry: Judgement and insight appear poor. Mood & affect appropriate.     Data Reviewed: I have personally reviewed following labs and imaging studies  CBC: Recent Labs  Lab 03/05/21 1609 03/06/21 0639 03/07/21 0349 03/08/21 0341  WBC 13.4* 14.4* 13.9* 12.4*  NEUTROABS 11.2*  --   --   --   HGB 11.1* 11.5* 10.8* 10.3*  HCT 33.5* 34.4* 33.2* 30.8*  MCV 95.2 94.0 95.1 93.1  PLT 219 280 247 762   Basic Metabolic Panel: Recent Labs  Lab 03/05/21 1609 03/07/21 0349 03/08/21 0341  NA 139 140 139  K 3.6 3.0* 3.8  CL 103 101 104  CO2 27 25 26   GLUCOSE 134* 114* 139*  BUN 32* 29* 31*  CREATININE 1.48* 1.21 1.07  CALCIUM 8.8* 8.7* 8.6*  MG  --  1.6* 2.2  PHOS  --   --  2.6   GFR: Estimated Creatinine Clearance: 50.4 mL/min (by C-G formula based on SCr of 1.07 mg/dL). Liver Function Tests: Recent Labs  Lab 03/05/21 1609  AST 53*  ALT 27  ALKPHOS 72  BILITOT 0.9  PROT 7.3  ALBUMIN 3.0*   No results for input(s): LIPASE, AMYLASE in the last 168 hours. No  results for input(s): AMMONIA in the last 168 hours. Coagulation Profile: No results for input(s): INR, PROTIME in the last 168 hours. Cardiac Enzymes: No results for input(s): CKTOTAL, CKMB, CKMBINDEX, TROPONINI in the last 168 hours. BNP (last 3 results) Recent Labs    03/26/20 1124  PROBNP 5,675*   HbA1C: No results for input(s): HGBA1C in the last 72 hours. CBG: No results for input(s): GLUCAP in the last 168 hours. Lipid Profile: No results for input(s): CHOL, HDL, LDLCALC, TRIG, CHOLHDL, LDLDIRECT in the last 72 hours. Thyroid Function Tests: No results for input(s): TSH, T4TOTAL, FREET4, T3FREE, THYROIDAB in the last 72 hours. Anemia Panel: No results for input(s): VITAMINB12, FOLATE, FERRITIN, TIBC, IRON, RETICCTPCT in  the last 72 hours. Sepsis Labs: No results for input(s): PROCALCITON, LATICACIDVEN in the last 168 hours.  Recent Results (from the past 240 hour(s))  Resp Panel by RT-PCR (Flu A&B, Covid) Nasopharyngeal Swab     Status: None   Collection Time: 03/05/21  6:17 PM   Specimen: Nasopharyngeal Swab; Nasopharyngeal(NP) swabs in vial transport medium  Result Value Ref Range Status   SARS Coronavirus 2 by RT PCR NEGATIVE NEGATIVE Final    Comment: (NOTE) SARS-CoV-2 target nucleic acids are NOT DETECTED.  The SARS-CoV-2 RNA is generally detectable in upper respiratory specimens during the acute phase of infection. The lowest concentration of SARS-CoV-2 viral copies this assay can detect is 138 copies/mL. A negative result does not preclude SARS-Cov-2 infection and should not be used as the sole basis for treatment or other patient management decisions. A negative result may occur with  improper specimen collection/handling, submission of specimen other than nasopharyngeal swab, presence of viral mutation(s) within the areas targeted by this assay, and inadequate number of viral copies(<138 copies/mL). A negative result must be combined with clinical observations, patient history, and epidemiological information. The expected result is Negative.  Fact Sheet for Patients:  EntrepreneurPulse.com.au  Fact Sheet for Healthcare Providers:  IncredibleEmployment.be  This test is no t yet approved or cleared by the [place] FDA and  has been authorized for detection and/or diagnosis of SARS-CoV-2 by FDA under an Emergency Use Authorization (EUA). This EUA will remain  in effect (meaning this test can be used) for the duration of the COVID-19 declaration under Section 564(b)(1) of the Act, 21 U.S.C.section 360bbb-3(b)(1), unless the authorization is terminated  or revoked sooner.       Influenza A by PCR NEGATIVE NEGATIVE Final   Influenza B by PCR  NEGATIVE NEGATIVE Final    Comment: (NOTE) The Xpert Xpress SARS-CoV-2/FLU/RSV plus assay is intended as an aid in the diagnosis of influenza from Nasopharyngeal swab specimens and should not be used as a sole basis for treatment. Nasal washings and aspirates are unacceptable for Xpert Xpress SARS-CoV-2/FLU/RSV testing.  Fact Sheet for Patients: EntrepreneurPulse.com.au  Fact Sheet for Healthcare Providers: IncredibleEmployment.be  This test is not yet approved or cleared by the [place] FDA and has been authorized for detection and/or diagnosis of SARS-CoV-2 by FDA under an Emergency Use Authorization (EUA). This EUA will remain in effect (meaning this test can be used) for the duration of the COVID-19 declaration under Section 564(b)(1) of the Act, 21 U.S.C. section 360bbb-3(b)(1), unless the authorization is terminated or revoked.  Performed at [facility], [place] [address] [place], [place] [address]          Radiology Studies: DG CHEST PORT 1 VIEW  Result Date: 03/08/2021 CLINICAL DATA:  Shortness of breath EXAM:  PORTABLE CHEST 1 VIEW COMPARISON:  Prior chest x-ray 03/05/2021 FINDINGS: Stable cardiomegaly. Patient is status post median sternotomy with evidence of prior multivessel CABG. Progressive airspace opacities in both lung bases with greater obscuration of the diaphragmatic shadows. Small amount of fluid versus atelectasis along the minor fissure on the right. Perhaps slightly increased pulmonary vascular congestion. No pneumothorax. No acute osseous abnormality. IMPRESSION: 1. Progressive bibasilar nonspecific airspace opacities. Differential considerations include increasing bibasilar atelectasis, aspiration, or multi lobar pneumonia. 2. Cardiomegaly with mild pulmonary vascular congestion but no overt edema. Electronically Signed   By: Jacqulynn Cadet M.D.   On: 03/08/2021 05:44        Scheduled  Meds:  vitamin C  500 mg Oral Daily   aspirin EC  81 mg Oral Daily   cholecalciferol  2,000 Units Oral Daily   docusate sodium  100 mg Oral Daily   enoxaparin (LOVENOX) injection  40 mg Subcutaneous Q24H   feeding supplement  237 mL Oral TID BM   fluticasone furoate-vilanterol  1 puff Inhalation Daily   furosemide  20 mg Intravenous Daily   melatonin  3 mg Oral QHS   memantine  10 mg Oral BID   metoprolol succinate  50 mg Oral Daily   And   metoprolol succinate  25 mg Oral QHS   pantoprazole  40 mg Oral Daily   Pirfenidone  267 mg Oral 3 times per day   simvastatin  20 mg Oral q1800   vitamin B-12  1,000 mcg Oral Daily   Continuous Infusions:  azithromycin 250 mL/hr at 03/08/21 0649   cefTRIAXone (ROCEPHIN)  IV Stopped (03/07/21 2315)     LOS: 3 days    Time spent: 42 minutes spent on chart review, discussion with nursing staff, consultants, updating family and interview/physical exam; more than 50% of that time was spent in counseling and/or coordination of care.    Vasilios Ottaway J [place] DO [facility] Available via Epic secure chat 7am-7pm After these hours, please refer to coverage provider listed on [email or web address] 03/08/2021, 12:26 PM

## 2021-03-08 NOTE — Progress Notes (Signed)
PHARMACIST - PHYSICIAN COMMUNICATION DR:   [place] CONCERNING: Antibiotic IV to Oral Route Change Policy  RECOMMENDATION: This patient is receiving azithromycin by the intravenous route.  Based on criteria approved by the Pharmacy and Therapeutics Committee, the antibiotic(s) is/are being converted to the equivalent oral dose form(s).   DESCRIPTION: These criteria include: Patient being treated for a respiratory tract infection, urinary tract infection, cellulitis or clostridium difficile associated diarrhea if on metronidazole The patient is not neutropenic and does not exhibit a GI malabsorption state The patient is eating (either orally or via tube) and/or has been taking other orally administered medications for a least 24 hours The patient is improving clinically and has a Tmax < 100.5  If you have questions about this conversion, please contact the Pharmacy Department  []   [phone] )  [facility] []   [phone] )  [facility] Pontes  []   [phone] )  [facility] [x]   [phone] )  [place], PharmD 03/08/2021 1:57 PM

## 2021-03-08 NOTE — Consult Note (Signed)
[address] [facility] Inpatient Consult   03/08/2021  ADANTE COURINGTON 1940/10/27 [id number]  [facility] CM)   Primary Care Provider:  [facility] does [facility] calls and offers embedded chronic care management team    Patient was screened for [facility] services due to noted high unplanned readmission risk score.   Plan: Continue to follow for progression and disposition plans.  Of note, [facility] Care Management services does not replace or interfere with any services that are arranged by inpatient case management or social work.   Netta Cedars, MSN, RN [facility] [employer] [phone]  Toll free office [phone]

## 2021-03-09 DIAGNOSIS — J9601 Acute respiratory failure with hypoxia: Secondary | ICD-10-CM | POA: Diagnosis not present

## 2021-03-09 LAB — BASIC METABOLIC PANEL
Anion gap: 9 (ref 5–15)
BUN: 29 mg/dL — ABNORMAL HIGH (ref 8–23)
CO2: 26 mmol/L (ref 22–32)
Calcium: 8.8 mg/dL — ABNORMAL LOW (ref 8.9–10.3)
Chloride: 103 mmol/L (ref 98–111)
Creatinine, Ser: 1.29 mg/dL — ABNORMAL HIGH (ref 0.61–1.24)
GFR, Estimated: 56 mL/min — ABNORMAL LOW (ref >60)
Glucose, Bld: 112 mg/dL — ABNORMAL HIGH (ref 70–99)
Potassium: 3.5 mmol/L (ref 3.5–5.1)
Sodium: 138 mmol/L (ref 135–145)

## 2021-03-09 LAB — CBC
HCT: 31 % — ABNORMAL LOW (ref 39.0–52.0)
Hemoglobin: 10.2 g/dL — ABNORMAL LOW (ref 13.0–17.0)
MCH: 31.1 pg (ref 26.0–34.0)
MCHC: 32.9 g/dL (ref 30.0–36.0)
MCV: 94.5 fL (ref 80.0–100.0)
Platelets: 300 10*3/uL (ref 150–400)
RBC: 3.28 MIL/uL — ABNORMAL LOW (ref 4.22–5.81)
RDW: 13.5 % (ref 11.5–15.5)
WBC: 10.4 10*3/uL (ref 4.0–10.5)
nRBC: 0 % (ref 0.0–0.2)

## 2021-03-09 LAB — MAGNESIUM: Magnesium: 1.9 mg/dL (ref 1.7–2.4)

## 2021-03-09 MED ORDER — FUROSEMIDE 20 MG PO TABS
10.0000 mg | ORAL_TABLET | Freq: Every day | ORAL | Status: DC
  Administered 2021-03-09 – 2021-03-13 (×5): 10 mg via ORAL
  Filled 2021-03-09 (×5): qty 1

## 2021-03-09 MED ORDER — POTASSIUM CHLORIDE CRYS ER 20 MEQ PO TBCR
40.0000 meq | EXTENDED_RELEASE_TABLET | Freq: Once | ORAL | Status: AC
  Administered 2021-03-09: 40 meq via ORAL
  Filled 2021-03-09: qty 2

## 2021-03-09 NOTE — Progress Notes (Signed)
Occupational Therapy Treatment Patient Details Name: Isaac [name] MRN: [id number] DOB: [birth year] Today's Date: 03/09/2021   History of present illness Isaac [name] is a 80 y.o. male presents with generalized weakness, nausea, fatigue and decreased appetite. Pt admitted with Acute hypoxic respiratory failure secondary to community-acquired pneumonia in the setting of pulmonary fibrosis. PMH: CAD, CHF, dementia, HTN, GERD, hyperlipidemia, HTN, MI, CKD, CABG 2002   OT comments  Pt with lethargy. Mod assist to achieve sitting EOB. Sat x 10 min with min to min guard assist, intermittently opening eyes, but only stating he was at home. Offered pt drink with straw, did not sip. Pt returned to side lying without physical assist. Updated d/c recommendation to SNF, but pt may be able to return home once he can demonstrate increased level of alertness and OOB mobility. VSS on RA.   Recommendations for follow up therapy are one component of a multi-disciplinary discharge planning process, led by the attending physician.  Recommendations may be updated based on patient status, additional functional criteria and insurance authorization.    Follow Up Recommendations  Skilled nursing-short term rehab (<3 hours/day)    Assistance Recommended at Discharge Frequent or constant Supervision/Assistance  Equipment Recommendations       Recommendations for Other Services      Precautions / Restrictions Precautions Precautions: Fall       Mobility Bed Mobility Overal bed mobility: Needs Assistance Bed Mobility: Supine to Sit;Sit to Sidelying     Supine to sit: Mod assist   Sit to sidelying: Min guard General bed mobility comments: assist for LEs over EOB and to raise trunk, pt returned to side lying with guidance only    Transfers                   General transfer comment: deferred due to lethargy     Balance Overall balance assessment: Needs assistance Sitting-balance support: Feet  supported Sitting balance-Leahy Scale: Fair Sitting balance - Comments: poor initially, progressed to fair Postural control: Posterior lean                                 ADL either performed or assessed with clinical judgement   ADL                                         General ADL Comments: total assist    Extremity/Trunk Assessment              Vision       Perception     Praxis      Cognition Arousal/Alertness: Lethargic Behavior During Therapy: Flat affect                                   General Comments: following commands inconsistently, stated he was at home, per RN pt did not sleep much last night          Exercises     Shoulder Instructions       General Comments      Pertinent Vitals/ Pain       Pain Assessment: Faces Faces Pain Scale: No hurt  Home Living  Prior Functioning/Environment              Frequency  Min 2X/week        Progress Toward Goals  OT Goals(current goals can now be found in the care plan section)  Progress towards OT goals: Not progressing toward goals - comment (lethargic)  Acute Rehab OT Goals OT Goal Formulation: With patient/family Time For Goal Achievement: 03/21/21 Potential to Achieve Goals: Good  Plan Discharge plan needs to be updated    Co-evaluation                 AM-PAC OT "6 Clicks" Daily Activity     Outcome Measure   Help from another person eating meals?: Total Help from another person taking care of personal grooming?: Total Help from another person toileting, which includes using toliet, bedpan, or urinal?: Total Help from another person bathing (including washing, rinsing, drying)?: Total Help from another person to put on and taking off regular upper body clothing?: Total Help from another person to put on and taking off regular lower body clothing?: Total 6 Click  Score: 6    End of Session    OT Visit Diagnosis: Unsteadiness on feet (R26.81);Muscle weakness (generalized) (M62.81)   Activity Tolerance Patient limited by lethargy   Patient Left in bed;with call bell/phone within reach;with bed alarm set   Nurse Communication Other (comment) (aware of pt's lethargy)        Time: 8264-1583 OT Time Calculation (min): 13 min  Charges: OT General Charges $OT Visit: 1 Visit OT Treatments $Therapeutic Activity: 8-22 mins  Nestor Lewandowsky, OTR/L Acute Rehabilitation Services Pager: [phone] Office: [phone]  Malka So 03/09/2021, 11:31 AM

## 2021-03-09 NOTE — Progress Notes (Signed)
PROGRESS NOTE    Isaac [name]  YSA:[id number] DOB: [birth year] DOA: 03/05/2021 PCP: Dettinger, Fransisca Kaufmann, MD    Brief Narrative:  Isaac [name] is an 80 year old male with past medical history significant for dementia, CAD s/p CABG, chronic diastolic congestive heart failure, pulmonary fibrosis, CKD stage IIIa, TIA, hyperlipidemia, GERD who presents to [facility] ED 11/8 from home with weakness, decreased oral intake, shortness of breath and cough.  Spouse present at bedside provides history given his underlying dementia and altered mental status.  She reports that patient has been sick for the last 2 weeks with decreased oral intake and now with inability to ambulate.  At baseline walks with a cane.  Cough is productive of green sputum with some increased respiratory effort.  No complaints of pain, no nausea/vomiting/diarrhea.  No lower extremity edema.  In the ED, temperature 98.7 F, HR 76, RR 19, BP 143/71, SPO2 88% and placed on 3 L nasal cannula.  Sodium 139, potassium 3.6, chloride 103, CO2 27, glucose 134, BUN 32, creatinine 1.48, AST 53, ALT 27, BNP 662.4.  High sensitive troponin 410> 384.  WBC 13.4, hemoglobin 11.1, platelets 219.  Covid-19 PCR negative.  Influenza A/B PCR negative.  Urinalysis unrevealing.  Chest x-ray with cardiomegaly, bibasilar opacities concerning for atelectasis versus infiltrate.  EKG personally reviewed with NSR, rate 73, QTc 471, no concerning dynamic changes.  EDP started IV Rocephin/azithromycin.  Hospitalist service consulted for further evaluation management of acute metabolic encephalopathy, weakness secondary to community acquired pneumonia.   Assessment & Plan:   Principal Problem:   Acute respiratory failure with hypoxia (HCC) Active Problems:   Hyperlipidemia LDL goal <70   CAD (coronary artery disease), native coronary artery - LIMA-LAD, SVG-Diag, SVG-RCA 2002   Essential hypertension   Chronic diastolic CHF (congestive heart failure) (HCC)   Acute  hypoxic respiratory failure, POA Community acquired pneumonia Patient presenting to ED with 2-week history of progressive weakness, productive cough with green sputum.  Elevated white blood cell count of 14.4 with chest x-ray findings consistent with pneumonia.  Patient is known to be hypoxic with SPO2 88% on room air requiring 3 L nasal cannula to maintain adequate oxygenation.  Suspect etiology atypical versus gram-negative organism.  Completed 5-day course of azithromycin. --Azithromycin 500 mg IV q24h x 5 days --Ceftriaxone 1 g IV every 24 hours x 5 days --Continue supplemental oxygen, maintain SPO2 greater than 88%, oxygen now titrated off and on room air. --Ambulatory O2 screen today  Elevated troponin High sensitive troponin peaked at 410 followed by 384.  EKG with no concerning dynamic changes.  Etiology likely secondary to type II demand ischemia in the setting of pneumonia as above. --Continue to monitor on telemetry  Essential hypertension Chronic diastolic congestive heart failure, compensated Home regimen includes metoprolol succinate 50 mg p.o. qAM, 25 mg p.o. qHS, furosemide 40 mg as needed for fluid/edema. --Metoprolol succinate 50 mg PO qAm and 25mg  PO qPM --Lasix 10 mg PO every 24 hours --Strict I's and O's and daily weights  CAD s/p CABG --Continue aspirin and statin  Hyperlipidemia: Simvastatin 20 mg p.o. daily  Pulmonary fibrosis --Continue home Pirfenidone 267 mg TID  GERD On omeprazole 20 mg p.o. daily at home. --Continue Protonix 40 mg p.o. daily as hospital substitution  Dementia --Memantine 10 mg p.o. twice daily --melatonin 3mg  PO qHS --Delirium precautions --Get up during the day --Encourage a familiar face to remain present throughout the day --Keep blinds open and lights on during daylight hours --Minimize  the use of opioids/benzodiazepines  Weakness/debility/deconditioning: --PT now recommending SNF on discharge, TOC for placement --Continue  therapy efforts while inpatient    DVT prophylaxis: enoxaparin (LOVENOX) injection 40 mg Start: 03/05/21 2200   Code Status: DNR Family Communication: Updated patient's sister was present at bedside yesterday morning Disposition Plan: SNF Level of care: Telemetry Status is: Inpatient  Remains inpatient appropriate because: IV antibiotics, pending SNF placement  Consultants:  None  Procedures:  None  Antimicrobials:  Azithromycin 11/8 - 11/12 Ceftriaxone 11/8 - 11/12   Subjective: Patient seen examined bedside, resting comfortably.  Sleeping but arousable.  Pleasantly confused.  No family present at bedside this morning, updated sister yesterday.  Completing IV antibiotics today.  Oxygen now weaned off.  Awaiting SNF placement. Denies headache, no chest pain, no abdominal pain, no fever.  No other acute events overnight per nursing staff.  Objective: Vitals:   03/08/21 1228 03/08/21 2040 03/09/21 0500 03/09/21 0640  BP: (!) 118/56 (!) 158/84  136/71  Pulse: 76 82  70  Resp:  20  17  Temp: 97.9 F (36.6 C) 98.2 F (36.8 C)  99.1 F (37.3 C)  TempSrc: Oral Oral  Oral  SpO2: 97% 97%  96%  Weight:   61.6 kg   Height:        Intake/Output Summary (Last 24 hours) at 03/09/2021 1129 Last data filed at 03/08/2021 1300 Gross per 24 hour  Intake 60 ml  Output --  Net 60 ml   Filed Weights   03/07/21 0500 03/08/21 0500 03/09/21 0500  Weight: 64.2 kg 64.7 kg 61.6 kg    Examination:  General exam: Appears calm and comfortable, pleasantly confused Respiratory system: Breath sounds slightly decreased bilateral bases, normal respiratory effort, on room air at rest Cardiovascular system: S1 & S2 heard, RRR. No JVD, murmurs, rubs, gallops or clicks. No pedal edema. Gastrointestinal system: Abdomen is nondistended, soft and nontender. No organomegaly or masses felt. Normal bowel sounds heard. Central nervous system: Alert, oriented to time (2022) but not place Isaac [name]), not person. No focal neurological deficits. Extremities: Symmetric 5 x 5 power. Skin: No rashes, lesions or ulcers Psychiatry: Judgement and insight appear poor. Mood & affect appropriate.     Data Reviewed: I have personally reviewed following labs and imaging studies  CBC: Recent Labs  Lab 03/05/21 1609 03/06/21 0639 03/07/21 0349 03/08/21 0341 03/09/21 0435  WBC 13.4* 14.4* 13.9* 12.4* 10.4  NEUTROABS 11.2*  --   --   --   --   HGB 11.1* 11.5* 10.8* 10.3* 10.2*  HCT 33.5* 34.4* 33.2* 30.8* 31.0*  MCV 95.2 94.0 95.1 93.1 94.5  PLT 219 280 247 266 782   Basic Metabolic Panel: Recent Labs  Lab 03/05/21 1609 03/07/21 0349 03/08/21 0341 03/09/21 0435  NA 139 140 139 138  K 3.6 3.0* 3.8 3.5  CL 103 101 104 103  CO2 27 25 26 26   GLUCOSE 134* 114* 139* 112*  BUN 32* 29* 31* 29*  CREATININE 1.48* 1.21 1.07 1.29*  CALCIUM 8.8* 8.7* 8.6* 8.8*  MG  --  1.6* 2.2 1.9  PHOS  --   --  2.6  --    GFR: Estimated Creatinine Clearance: 39.8 mL/min (A) (by C-G formula based on SCr of 1.29 mg/dL (H)). Liver Function Tests: Recent Labs  Lab 03/05/21 1609  AST 53*  ALT 27  ALKPHOS 72  BILITOT 0.9  PROT 7.3  ALBUMIN 3.0*   No results for input(s): LIPASE,  AMYLASE in the last 168 hours. No results for input(s): AMMONIA in the last 168 hours. Coagulation Profile: No results for input(s): INR, PROTIME in the last 168 hours. Cardiac Enzymes: No results for input(s): CKTOTAL, CKMB, CKMBINDEX, TROPONINI in the last 168 hours. BNP (last 3 results) Recent Labs    03/26/20 1124  PROBNP 5,675*   HbA1C: No results for input(s): HGBA1C in the last 72 hours. CBG: No results for input(s): GLUCAP in the last 168 hours. Lipid Profile: No results for input(s): CHOL, HDL, LDLCALC, TRIG, CHOLHDL, LDLDIRECT in the last 72 hours. Thyroid Function Tests: No results for input(s): TSH, T4TOTAL, FREET4, T3FREE, THYROIDAB in the last 72 hours. Anemia Panel: No results for  input(s): VITAMINB12, FOLATE, FERRITIN, TIBC, IRON, RETICCTPCT in the last 72 hours. Sepsis Labs: No results for input(s): PROCALCITON, LATICACIDVEN in the last 168 hours.  Recent Results (from the past 240 hour(s))  Resp Panel by RT-PCR (Flu A&B, Covid) Nasopharyngeal Swab     Status: None   Collection Time: 03/05/21  6:17 PM   Specimen: Nasopharyngeal Swab; Nasopharyngeal(NP) swabs in vial transport medium  Result Value Ref Range Status   SARS Coronavirus 2 by RT PCR NEGATIVE NEGATIVE Final    Comment: (NOTE) SARS-CoV-2 target nucleic acids are NOT DETECTED.  The SARS-CoV-2 RNA is generally detectable in upper respiratory specimens during the acute phase of infection. The lowest concentration of SARS-CoV-2 viral copies this assay can detect is 138 copies/mL. A negative result does not preclude SARS-Cov-2 infection and should not be used as the sole basis for treatment or other patient management decisions. A negative result may occur with  improper specimen collection/handling, submission of specimen other than nasopharyngeal swab, presence of viral mutation(s) within the areas targeted by this assay, and inadequate number of viral copies(<138 copies/mL). A negative result must be combined with clinical observations, patient history, and epidemiological information. The expected result is Negative.  Fact Sheet for Patients:  EntrepreneurPulse.com.au  Fact Sheet for Healthcare Providers:  IncredibleEmployment.be  This test is no t yet approved or cleared by the [place] FDA and  has been authorized for detection and/or diagnosis of SARS-CoV-2 by FDA under an Emergency Use Authorization (EUA). This EUA will remain  in effect (meaning this test can be used) for the duration of the COVID-19 declaration under Section 564(b)(1) of the Act, 21 U.S.C.section 360bbb-3(b)(1), unless the authorization is terminated  or revoked sooner.        Influenza A by PCR NEGATIVE NEGATIVE Final   Influenza B by PCR NEGATIVE NEGATIVE Final    Comment: (NOTE) The Xpert Xpress SARS-CoV-2/FLU/RSV plus assay is intended as an aid in the diagnosis of influenza from Nasopharyngeal swab specimens and should not be used as a sole basis for treatment. Nasal washings and aspirates are unacceptable for Xpert Xpress SARS-CoV-2/FLU/RSV testing.  Fact Sheet for Patients: EntrepreneurPulse.com.au  Fact Sheet for Healthcare Providers: IncredibleEmployment.be  This test is not yet approved or cleared by the [place] FDA and has been authorized for detection and/or diagnosis of SARS-CoV-2 by FDA under an Emergency Use Authorization (EUA). This EUA will remain in effect (meaning this test can be used) for the duration of the COVID-19 declaration under Section 564(b)(1) of the Act, 21 U.S.C. section 360bbb-3(b)(1), unless the authorization is terminated or revoked.  Performed at [facility], [place] [address], [place], [place] [address]          Radiology Studies: DG CHEST PORT 1 VIEW  Result Date: 03/08/2021  CLINICAL DATA:  Shortness of breath EXAM: PORTABLE CHEST 1 VIEW COMPARISON:  Prior chest x-ray 03/05/2021 FINDINGS: Stable cardiomegaly. Patient is status post median sternotomy with evidence of prior multivessel CABG. Progressive airspace opacities in both lung bases with greater obscuration of the diaphragmatic shadows. Small amount of fluid versus atelectasis along the minor fissure on the right. Perhaps slightly increased pulmonary vascular congestion. No pneumothorax. No acute osseous abnormality. IMPRESSION: 1. Progressive bibasilar nonspecific airspace opacities. Differential considerations include increasing bibasilar atelectasis, aspiration, or multi lobar pneumonia. 2. Cardiomegaly with mild pulmonary vascular congestion but no overt edema. Electronically Signed   By: Jacqulynn Cadet M.D.   On: 03/08/2021 05:44        Scheduled Meds:  vitamin C  500 mg Oral Daily   aspirin EC  81 mg Oral Daily   cholecalciferol  2,000 Units Oral Daily   docusate sodium  100 mg Oral Daily   enoxaparin (LOVENOX) injection  40 mg Subcutaneous Q24H   feeding supplement  237 mL Oral TID BM   fluticasone furoate-vilanterol  1 puff Inhalation Daily   furosemide  10 mg Oral Daily   melatonin  3 mg Oral QHS   memantine  10 mg Oral BID   metoprolol succinate  50 mg Oral Daily   And   metoprolol succinate  25 mg Oral QHS   pantoprazole  40 mg Oral Daily   Pirfenidone  267 mg Oral 3 times per day   simvastatin  20 mg Oral q1800   vitamin B-12  1,000 mcg Oral Daily   Continuous Infusions:  cefTRIAXone (ROCEPHIN)  IV 1 g (03/08/21 2016)     LOS: 4 days    Time spent: 39 minutes spent on chart review, discussion with nursing staff, consultants, updating family and interview/physical exam; more than 50% of that time was spent in counseling and/or coordination of care.    Arshiya Jakes J [place] DO [facility] Available via Epic secure chat 7am-7pm After these hours, please refer to coverage provider listed on [email or web address] 03/09/2021, 11:29 AM

## 2021-03-09 NOTE — Plan of Care (Signed)

## 2021-03-10 DIAGNOSIS — J9601 Acute respiratory failure with hypoxia: Secondary | ICD-10-CM | POA: Diagnosis not present

## 2021-03-10 LAB — CBC
HCT: 34.8 % — ABNORMAL LOW (ref 39.0–52.0)
Hemoglobin: 11.4 g/dL — ABNORMAL LOW (ref 13.0–17.0)
MCH: 31.1 pg (ref 26.0–34.0)
MCHC: 32.8 g/dL (ref 30.0–36.0)
MCV: 95.1 fL (ref 80.0–100.0)
Platelets: 327 10*3/uL (ref 150–400)
RBC: 3.66 MIL/uL — ABNORMAL LOW (ref 4.22–5.81)
RDW: 13.4 % (ref 11.5–15.5)
WBC: 9 10*3/uL (ref 4.0–10.5)
nRBC: 0 % (ref 0.0–0.2)

## 2021-03-10 LAB — BASIC METABOLIC PANEL
Anion gap: 7 (ref 5–15)
BUN: 28 mg/dL — ABNORMAL HIGH (ref 8–23)
CO2: 27 mmol/L (ref 22–32)
Calcium: 8.8 mg/dL — ABNORMAL LOW (ref 8.9–10.3)
Chloride: 105 mmol/L (ref 98–111)
Creatinine, Ser: 1.27 mg/dL — ABNORMAL HIGH (ref 0.61–1.24)
GFR, Estimated: 57 mL/min — ABNORMAL LOW (ref >60)
Glucose, Bld: 102 mg/dL — ABNORMAL HIGH (ref 70–99)
Potassium: 4.1 mmol/L (ref 3.5–5.1)
Sodium: 139 mmol/L (ref 135–145)

## 2021-03-10 LAB — MAGNESIUM: Magnesium: 1.9 mg/dL (ref 1.7–2.4)

## 2021-03-10 MED ORDER — DIPHENHYDRAMINE HCL 50 MG/ML IJ SOLN
25.0000 mg | Freq: Once | INTRAMUSCULAR | Status: AC
  Administered 2021-03-10: 25 mg via INTRAVENOUS
  Filled 2021-03-10: qty 1

## 2021-03-10 NOTE — Plan of Care (Signed)

## 2021-03-10 NOTE — Progress Notes (Signed)
MD paged due to patients multiple attempts to unsafely get out of bed.

## 2021-03-10 NOTE — Progress Notes (Signed)
PROGRESS NOTE    Isaac [name]  MPN:[id number] DOB: 04-25-1941 DOA: 03/05/2021 PCP: Dettinger, Fransisca Kaufmann, MD    Brief Narrative:  Isaac [name] is an 80 year old male with past medical history significant for dementia, CAD s/p CABG, chronic diastolic congestive heart failure, pulmonary fibrosis, CKD stage IIIa, TIA, hyperlipidemia, GERD who presents to [facility] ED 11/8 from home with weakness, decreased oral intake, shortness of breath and cough.  Spouse present at bedside provides history given his underlying dementia and altered mental status.  She reports that patient has been sick for the last 2 weeks with decreased oral intake and now with inability to ambulate.  At baseline walks with a cane.  Cough is productive of green sputum with some increased respiratory effort.  No complaints of pain, no nausea/vomiting/diarrhea.  No lower extremity edema.  In the ED, temperature 98.7 F, HR 76, RR 19, BP 143/71, SPO2 88% and placed on 3 L nasal cannula.  Sodium 139, potassium 3.6, chloride 103, CO2 27, glucose 134, BUN 32, creatinine 1.48, AST 53, ALT 27, BNP 662.4.  High sensitive troponin 410> 384.  WBC 13.4, hemoglobin 11.1, platelets 219.  Covid-19 PCR negative.  Influenza A/B PCR negative.  Urinalysis unrevealing.  Chest x-ray with cardiomegaly, bibasilar opacities concerning for atelectasis versus infiltrate.  EKG personally reviewed with NSR, rate 73, QTc 471, no concerning dynamic changes.  EDP started IV Rocephin/azithromycin.  Hospitalist service consulted for further evaluation management of acute metabolic encephalopathy, weakness secondary to community acquired pneumonia.   Assessment & Plan:   Principal Problem:   Acute respiratory failure with hypoxia (HCC) Active Problems:   Hyperlipidemia LDL goal <70   CAD (coronary artery disease), native coronary artery - LIMA-LAD, SVG-Diag, SVG-RCA 2002   Essential hypertension   Chronic diastolic CHF (congestive heart failure) (HCC)   Acute  hypoxic respiratory failure, POA resolved Community acquired pneumonia Patient presenting to ED with 2-week history of progressive weakness, productive cough with green sputum.  Elevated white blood cell count of 14.4 with chest x-ray findings consistent with pneumonia.  Patient is known to be hypoxic with SPO2 88% on room air requiring 3 L nasal cannula to maintain adequate oxygenation.  Suspect etiology atypical versus gram-negative organism.  Completed 5-day course of azithromycin and now titrated off of submental oxygen. --Supportive care   Elevated troponin High sensitive troponin peaked at 410 followed by 384.  EKG with no concerning dynamic changes.  Etiology likely secondary to type II demand ischemia in the setting of pneumonia as above. --Continue to monitor on telemetry  Essential hypertension Chronic diastolic congestive heart failure, compensated Home regimen includes metoprolol succinate 50 mg p.o. qAM, 25 mg p.o. qHS, furosemide 40 mg as needed for fluid/edema. --Metoprolol succinate 50 mg PO qAm and 25mg  PO qPM --Lasix 10 mg PO every 24 hours --Strict I's and O's and daily weights  CAD s/p CABG --Continue aspirin and statin  Hyperlipidemia: Simvastatin 20 mg p.o. daily  Pulmonary fibrosis --Continue home Pirfenidone 267 mg TID  GERD On omeprazole 20 mg p.o. daily at home. --Continue Protonix 40 mg p.o. daily as hospital substitution  Dementia --Memantine 10 mg p.o. twice daily --melatonin 3mg  PO qHS --Delirium precautions --Get up during the day --Encourage a familiar face to remain present throughout the day --Keep blinds open and lights on during daylight hours --Minimize the use of opioids/benzodiazepines  Weakness/debility/deconditioning: --PT now recommending SNF on discharge, TOC for placement --Continue therapy efforts while inpatient    DVT prophylaxis: enoxaparin (LOVENOX)  injection 40 mg Start: 03/05/21 2200   Code Status: DNR Family  Communication: No family present at bedside this morning.  Disposition Plan: SNF Level of care: Telemetry Status is: Inpatient  Remains inpatient appropriate because: IV antibiotics, pending SNF placement  Consultants:  None  Procedures:  None  Antimicrobials:  Azithromycin 11/8 - 11/12 Ceftriaxone 11/8 - 11/12   Subjective: Patient seen examined bedside, resting comfortably.  Sleeping but arousable.  Pleasantly confused.  No family present at bedside this morning.  Completed course of antibiotics, now oxygen weaned off.  Awaiting SNF placement. Denies headache, no chest pain, no abdominal pain, no fever.  No acute events overnight per nursing staff.  Objective: Vitals:   03/10/21 0514 03/10/21 0839 03/10/21 0940 03/10/21 1255  BP: (!) 155/69   110/63  Pulse: 65  68 73  Resp: 18   20  Temp: 98.4 F (36.9 C)   98.4 F (36.9 C)  TempSrc: Axillary   Oral  SpO2: 97% 98%  97%  Weight:      Height:        Intake/Output Summary (Last 24 hours) at 03/10/2021 1300 Last data filed at 03/10/2021 0949 Gross per 24 hour  Intake 560 ml  Output 400 ml  Net 160 ml   Filed Weights   03/08/21 0500 03/09/21 0500 03/10/21 0500  Weight: 64.7 kg 61.6 kg 60.3 kg    Examination:  General exam: Appears calm and comfortable, pleasantly confused Respiratory system: Breath sounds slightly decreased bilateral bases, normal respiratory effort, on room air at rest Cardiovascular system: S1 & S2 heard, RRR. No JVD, murmurs, rubs, gallops or clicks. No pedal edema. Gastrointestinal system: Abdomen is nondistended, soft and nontender. No organomegaly or masses felt. Normal bowel sounds heard. Central nervous system: Alert, oriented to time (2022) but not place ("I am waking up"), and not person (no answer). No focal neurological deficits. Extremities: Symmetric 5 x 5 power. Skin: No rashes, lesions or ulcers Psychiatry: Judgement and insight appear poor. Mood & affect appropriate.     Data  Reviewed: I have personally reviewed following labs and imaging studies  CBC: Recent Labs  Lab 03/05/21 1609 03/06/21 0639 03/07/21 0349 03/08/21 0341 03/09/21 0435 03/10/21 0355  WBC 13.4* 14.4* 13.9* 12.4* 10.4 9.0  NEUTROABS 11.2*  --   --   --   --   --   HGB 11.1* 11.5* 10.8* 10.3* 10.2* 11.4*  HCT 33.5* 34.4* 33.2* 30.8* 31.0* 34.8*  MCV 95.2 94.0 95.1 93.1 94.5 95.1  PLT 219 280 247 266 300 710   Basic Metabolic Panel: Recent Labs  Lab 03/05/21 1609 03/07/21 0349 03/08/21 0341 03/09/21 0435 03/10/21 0355  NA 139 140 139 138 139  K 3.6 3.0* 3.8 3.5 4.1  CL 103 101 104 103 105  CO2 27 25 26 26 27   GLUCOSE 134* 114* 139* 112* 102*  BUN 32* 29* 31* 29* 28*  CREATININE 1.48* 1.21 1.07 1.29* 1.27*  CALCIUM 8.8* 8.7* 8.6* 8.8* 8.8*  MG  --  1.6* 2.2 1.9 1.9  PHOS  --   --  2.6  --   --    GFR: Estimated Creatinine Clearance: 39.6 mL/min (A) (by C-G formula based on SCr of 1.27 mg/dL (H)). Liver Function Tests: Recent Labs  Lab 03/05/21 1609  AST 53*  ALT 27  ALKPHOS 72  BILITOT 0.9  PROT 7.3  ALBUMIN 3.0*   No results for input(s): LIPASE, AMYLASE in the last 168 hours. No results for input(s):  AMMONIA in the last 168 hours. Coagulation Profile: No results for input(s): INR, PROTIME in the last 168 hours. Cardiac Enzymes: No results for input(s): CKTOTAL, CKMB, CKMBINDEX, TROPONINI in the last 168 hours. BNP (last 3 results) Recent Labs    03/26/20 1124  PROBNP 5,675*   HbA1C: No results for input(s): HGBA1C in the last 72 hours. CBG: No results for input(s): GLUCAP in the last 168 hours. Lipid Profile: No results for input(s): CHOL, HDL, LDLCALC, TRIG, CHOLHDL, LDLDIRECT in the last 72 hours. Thyroid Function Tests: No results for input(s): TSH, T4TOTAL, FREET4, T3FREE, THYROIDAB in the last 72 hours. Anemia Panel: No results for input(s): VITAMINB12, FOLATE, FERRITIN, TIBC, IRON, RETICCTPCT in the last 72 hours. Sepsis Labs: No results for  input(s): PROCALCITON, LATICACIDVEN in the last 168 hours.  Recent Results (from the past 240 hour(s))  Resp Panel by RT-PCR (Flu A&B, Covid) Nasopharyngeal Swab     Status: None   Collection Time: 03/05/21  6:17 PM   Specimen: Nasopharyngeal Swab; Nasopharyngeal(NP) swabs in vial transport medium  Result Value Ref Range Status   SARS Coronavirus 2 by RT PCR NEGATIVE NEGATIVE Final    Comment: (NOTE) SARS-CoV-2 target nucleic acids are NOT DETECTED.  The SARS-CoV-2 RNA is generally detectable in upper respiratory specimens during the acute phase of infection. The lowest concentration of SARS-CoV-2 viral copies this assay can detect is 138 copies/mL. A negative result does not preclude SARS-Cov-2 infection and should not be used as the sole basis for treatment or other patient management decisions. A negative result may occur with  improper specimen collection/handling, submission of specimen other than nasopharyngeal swab, presence of viral mutation(s) within the areas targeted by this assay, and inadequate number of viral copies(<138 copies/mL). A negative result must be combined with clinical observations, patient history, and epidemiological information. The expected result is Negative.  Fact Sheet for Patients:  EntrepreneurPulse.com.au  Fact Sheet for Healthcare Providers:  IncredibleEmployment.be  This test is no t yet approved or cleared by the [place] FDA and  has been authorized for detection and/or diagnosis of SARS-CoV-2 by FDA under an Emergency Use Authorization (EUA). This EUA will remain  in effect (meaning this test can be used) for the duration of the COVID-19 declaration under Section 564(b)(1) of the Act, 21 U.S.C.section 360bbb-3(b)(1), unless the authorization is terminated  or revoked sooner.       Influenza A by PCR NEGATIVE NEGATIVE Final   Influenza B by PCR NEGATIVE NEGATIVE Final    Comment: (NOTE) The  Xpert Xpress SARS-CoV-2/FLU/RSV plus assay is intended as an aid in the diagnosis of influenza from Nasopharyngeal swab specimens and should not be used as a sole basis for treatment. Nasal washings and aspirates are unacceptable for Xpert Xpress SARS-CoV-2/FLU/RSV testing.  Fact Sheet for Patients: EntrepreneurPulse.com.au  Fact Sheet for Healthcare Providers: IncredibleEmployment.be  This test is not yet approved or cleared by the [place] FDA and has been authorized for detection and/or diagnosis of SARS-CoV-2 by FDA under an Emergency Use Authorization (EUA). This EUA will remain in effect (meaning this test can be used) for the duration of the COVID-19 declaration under Section 564(b)(1) of the Act, 21 U.S.C. section 360bbb-3(b)(1), unless the authorization is terminated or revoked.  Performed at [facility], [place] [address], [place], [place] [address]          Radiology Studies: No results found.      Scheduled Meds:  vitamin C  500 mg Oral Daily  aspirin EC  81 mg Oral Daily   cholecalciferol  2,000 Units Oral Daily   docusate sodium  100 mg Oral Daily   enoxaparin (LOVENOX) injection  40 mg Subcutaneous Q24H   feeding supplement  237 mL Oral TID BM   fluticasone furoate-vilanterol  1 puff Inhalation Daily   furosemide  10 mg Oral Daily   melatonin  3 mg Oral QHS   memantine  10 mg Oral BID   metoprolol succinate  50 mg Oral Daily   And   metoprolol succinate  25 mg Oral QHS   pantoprazole  40 mg Oral Daily   Pirfenidone  267 mg Oral 3 times per day   simvastatin  20 mg Oral q1800   vitamin B-12  1,000 mcg Oral Daily   Continuous Infusions:     LOS: 5 days    Time spent: 39 minutes spent on chart review, discussion with nursing staff, consultants, updating family and interview/physical exam; more than 50% of that time was spent in counseling and/or coordination of care.    Jarek Longton J  [place] DO [facility] Available via Epic secure chat 7am-7pm After these hours, please refer to coverage provider listed on [email or web address] 03/10/2021, 1:00 PM

## 2021-03-11 DIAGNOSIS — J9601 Acute respiratory failure with hypoxia: Secondary | ICD-10-CM | POA: Diagnosis not present

## 2021-03-11 MED ORDER — DIPHENHYDRAMINE HCL 50 MG/ML IJ SOLN
25.0000 mg | Freq: Once | INTRAMUSCULAR | Status: AC
  Administered 2021-03-11: 25 mg via INTRAVENOUS
  Filled 2021-03-11: qty 1

## 2021-03-11 NOTE — Progress Notes (Signed)
Physical Therapy Treatment Patient Details Name: RANEY KOEPPEN MRN: [id number] DOB: [birth year] Today's Date: 03/11/2021   History of Present Illness MANA MORISON is a 80 y.o. male presents with generalized weakness, nausea, fatigue and decreased appetite. Pt admitted with Acute hypoxic respiratory failure secondary to community-acquired pneumonia in the setting of pulmonary fibrosis. PMH: CAD, CHF, dementia, HTN, GERD, hyperlipidemia, HTN, MI, CKD, CABG 2002    PT Comments    General Comments: AxO x 1 mainly sleepy/groggy.  Difficult to maintain arousal.  Assisted OOB to amb to bathroom was difficult and required + 2 assist. General Gait Details: VERY unsteady groggy gait with poor forward flex posture and limited amb distance to and from bathrrom.  HIGH FALL RISK.  Poor self ability to use walker. General transfer comment: required + 2 assist to rise and achieve partial upright posture.  Poor forward flex posture with B hips and knees flexed.  Also assisted with a toilet transfer.  Very unsteady.  Very weak. Assisted to recliner and positioned to comfort.  Set up breakfast tray however pt unable to arouse enough to eat.  Pt plans to D/C to SNF.     Recommendations for follow up therapy are one component of a multi-disciplinary discharge planning process, led by the attending physician.  Recommendations may be updated based on patient status, additional functional criteria and insurance authorization.  Follow Up Recommendations  Skilled nursing-short term rehab (<3 hours/day)     Assistance Recommended at Discharge Frequent or constant Supervision/Assistance  Equipment Recommendations  None recommended by PT    Recommendations for Other Services       Precautions / Restrictions Precautions Precautions: Fall Restrictions Weight Bearing Restrictions: No     Mobility  Bed Mobility Overal bed mobility: Needs Assistance Bed Mobility: Supine to Sit     Supine to sit: Mod assist      General bed mobility comments: assist for LEs over EOB and to raise trunk    Transfers     Transfers: Sit to/from Stand Sit to Stand: Mod assist;+2 physical assistance;+2 safety/equipment           General transfer comment: required + 2 assist to rise and achieve partial upright posture.  Poor forward flex posture with B hips and knees flexed.  Also assisted with a toilet transfer.  Very unsteady.  Very weak.    Ambulation/Gait Ambulation/Gait assistance: Mod assist;+2 physical assistance;+2 safety/equipment;Max assist Gait Distance (Feet): 14 Feet (7 feet x 2 to and from bathroom) Assistive device: Rolling walker (2 wheels) Gait Pattern/deviations: Step-to pattern;Decreased stride length;Trunk flexed;Narrow base of support Gait velocity: decreased     General Gait Details: VERY unsteady groggy gait with poor forward flex posture and limited amb distance to and from bathrrom.  HIGH FALL RISK.  Poor self ability to use walker.   Stairs             Wheelchair Mobility    Modified Rankin (Stroke Patients Only)       Balance                                            Cognition Arousal/Alertness: Lethargic Behavior During Therapy: Flat affect Overall Cognitive Status: No family/caregiver present to determine baseline cognitive functioning  General Comments: AxO x 1 mainly sleepy/groggy        Exercises      General Comments        Pertinent Vitals/Pain Pain Assessment: No/denies pain    Home Living                          Prior Function            PT Goals (current goals can now be found in the care plan section) Progress towards PT goals: Progressing toward goals    Frequency    Min 3X/week      PT Plan Discharge plan needs to be updated    Co-evaluation              AM-PAC PT "6 Clicks" Mobility   Outcome Measure  Help needed turning from your  back to your side while in a flat bed without using bedrails?: A Lot Help needed moving from lying on your back to sitting on the side of a flat bed without using bedrails?: A Lot Help needed moving to and from a bed to a chair (including a wheelchair)?: A Lot Help needed standing up from a chair using your arms (e.g., wheelchair or bedside chair)?: A Lot Help needed to walk in hospital room?: A Lot Help needed climbing 3-5 steps with a railing? : Total 6 Click Score: 11    End of Session   Activity Tolerance: Patient limited by lethargy Patient left: in chair;with chair alarm set;with call bell/phone within reach Nurse Communication: Mobility status (RN in room) PT Visit Diagnosis: Unsteadiness on feet (R26.81);Other abnormalities of gait and mobility (R26.89);Muscle weakness (generalized) (M62.81)     Time: 0947-0962 PT Time Calculation (min) (ACUTE ONLY): 24 min  Charges:  $Gait Training: 8-22 mins $Therapeutic Activity: 8-22 mins                     Rica Koyanagi  PTA Acute  Rehabilitation Services Pager      [phone] Office      [phone]

## 2021-03-11 NOTE — Progress Notes (Signed)
Patient keeps trying to get out of bed unsafely. MD notified.

## 2021-03-11 NOTE — TOC Progression Note (Signed)
Transition of Care [facility]) - Progression Note    Patient Details  Name: Isaac [name] MRN: [id number] Date of Birth: 01/31/1941  [facility] CM/SW Contact  Purcell Mouton, RN Phone Number: 03/11/2021, 1:16 PM  Clinical Narrative:     Spoke with pt's wife concerning SNF. Pt was fax to SNF facilities.   Expected Discharge Plan: [place] Barriers to Discharge: No Barriers Identified  Expected Discharge Plan and Services Expected Discharge Plan: [place]   Discharge Planning Services: CM Consult   Living arrangements for the past 2 months: Single Family Home                                       Social Determinants of Health (SDOH) Interventions    Readmission Risk Interventions Readmission Risk Prevention Plan 12/03/2020  Post Dischage Appt Complete  Medication Screening Complete  Transportation Screening Complete  Some recent data might be hidden

## 2021-03-11 NOTE — Plan of Care (Signed)

## 2021-03-11 NOTE — Progress Notes (Signed)
PROGRESS NOTE    Isaac [name]  VHQ:[id number] DOB: 1940-11-24 DOA: 03/05/2021 PCP: Dettinger, Fransisca Kaufmann, MD    Brief Narrative:  Isaac [name] is an 80 year old male with past medical history significant for dementia, CAD s/p CABG, chronic diastolic congestive heart failure, pulmonary fibrosis, CKD stage IIIa, TIA, hyperlipidemia, GERD who presents to [facility] ED 11/8 from home with weakness, decreased oral intake, shortness of breath and cough.  Spouse present at bedside provides history given his underlying dementia and altered mental status.  She reports that patient has been sick for the last 2 weeks with decreased oral intake and now with inability to ambulate.  At baseline walks with a cane.  Cough is productive of green sputum with some increased respiratory effort.  No complaints of pain, no nausea/vomiting/diarrhea.  No lower extremity edema.  In the ED, temperature 98.7 F, HR 76, RR 19, BP 143/71, SPO2 88% and placed on 3 L nasal cannula.  Sodium 139, potassium 3.6, chloride 103, CO2 27, glucose 134, BUN 32, creatinine 1.48, AST 53, ALT 27, BNP 662.4.  High sensitive troponin 410> 384.  WBC 13.4, hemoglobin 11.1, platelets 219.  Covid-19 PCR negative.  Influenza A/B PCR negative.  Urinalysis unrevealing.  Chest x-ray with cardiomegaly, bibasilar opacities concerning for atelectasis versus infiltrate.  EKG personally reviewed with NSR, rate 73, QTc 471, no concerning dynamic changes.  EDP started IV Rocephin/azithromycin.  Hospitalist service consulted for further evaluation management of acute metabolic encephalopathy, weakness secondary to community acquired pneumonia.   Assessment & Plan:   Principal Problem:   Acute respiratory failure with hypoxia (HCC) Active Problems:   Hyperlipidemia LDL goal <70   CAD (coronary artery disease), native coronary artery - LIMA-LAD, SVG-Diag, SVG-RCA 2002   Essential hypertension   Chronic diastolic CHF (congestive heart failure) (HCC)   Acute  hypoxic respiratory failure, POA resolved Community acquired pneumonia Patient presenting to ED with 2-week history of progressive weakness, productive cough with green sputum.  Elevated white blood cell count of 14.4 with chest x-ray findings consistent with pneumonia.  Patient is known to be hypoxic with SPO2 88% on room air requiring 3 L nasal cannula to maintain adequate oxygenation.  Suspect etiology atypical versus gram-negative organism.  Completed 5-day course of azithromycin and now titrated off of submental oxygen. --Supportive care   Elevated troponin High sensitive troponin peaked at 410 followed by 384.  EKG with no concerning dynamic changes.  Etiology likely secondary to type II demand ischemia in the setting of pneumonia as above. --Continue to monitor on telemetry  Essential hypertension Chronic diastolic congestive heart failure, compensated Home regimen includes metoprolol succinate 50 mg p.o. qAM, 25 mg p.o. qHS, furosemide 40 mg as needed for fluid/edema. --Metoprolol succinate 50 mg PO qAm and 25mg  PO qPM --Lasix 10 mg PO daily --Strict I's and O's and daily weights  CAD s/p CABG --Continue aspirin and statin  Hyperlipidemia: Simvastatin 20 mg p.o. daily  Pulmonary fibrosis --Continue home Pirfenidone 267 mg TID  GERD On omeprazole 20 mg p.o. daily at home. --Continue Protonix 40 mg p.o. daily as hospital substitution  Dementia --Memantine 10 mg p.o. twice daily --melatonin 3mg  PO qHS --Delirium precautions --Get up during the day --Encourage a familiar face to remain present throughout the day --Keep blinds open and lights on during daylight hours --Minimize the use of opioids/benzodiazepines  Weakness/debility/deconditioning: --PT now recommending SNF on discharge, TOC for placement --Continue therapy efforts while inpatient    DVT prophylaxis: enoxaparin (LOVENOX) injection 40  mg Start: 03/05/21 2200   Code Status: DNR Family Communication: No  family present at bedside this morning.  Disposition Plan: SNF Level of care: Telemetry Status is: Inpatient  Remains inpatient appropriate because: IV antibiotics, pending SNF placement  Consultants:  None  Procedures:  None  Antimicrobials:  Azithromycin 11/8 - 11/12 Ceftriaxone 11/8 - 11/12   Subjective: Patient seen examined bedside, resting comfortably.  Sleeping but arousable.  Pleasantly confused.  No family present at bedside this morning.  Awaiting SNF placement. Denies headache, no chest pain, no abdominal pain, no fever.  No acute events overnight per nursing staff.  Objective: Vitals:   03/10/21 2033 03/11/21 0437 03/11/21 0500 03/11/21 0927  BP: (!) 167/82 (!) 141/79    Pulse: 80 70    Resp: 20 20    Temp: 98.3 F (36.8 C) 98.1 F (36.7 C)    TempSrc: Oral     SpO2: 100% 96%  96%  Weight:   60.4 kg   Height:        Intake/Output Summary (Last 24 hours) at 03/11/2021 1220 Last data filed at 03/11/2021 0300 Gross per 24 hour  Intake 720 ml  Output 650 ml  Net 70 ml   Filed Weights   03/09/21 0500 03/10/21 0500 03/11/21 0500  Weight: 61.6 kg 60.3 kg 60.4 kg    Examination:  General exam: Appears calm and comfortable, pleasantly confused Respiratory system: Breath sounds slightly decreased bilateral bases, normal respiratory effort, on room air at rest Cardiovascular system: S1 & S2 heard, RRR. No JVD, murmurs, rubs, gallops or clicks. No pedal edema. Gastrointestinal system: Abdomen is nondistended, soft and nontender. No organomegaly or masses felt. Normal bowel sounds heard. Central nervous system: Alert, oriented to time (2022) but not place (no answer), and not person (no answer). No focal neurological deficits. Extremities: Symmetric 5 x 5 power. Skin: No rashes, lesions or ulcers Psychiatry: Judgement and insight appear poor. Mood & affect appropriate.     Data Reviewed: I have personally reviewed following labs and imaging  studies  CBC: Recent Labs  Lab 03/05/21 1609 03/06/21 0639 03/07/21 0349 03/08/21 0341 03/09/21 0435 03/10/21 0355  WBC 13.4* 14.4* 13.9* 12.4* 10.4 9.0  NEUTROABS 11.2*  --   --   --   --   --   HGB 11.1* 11.5* 10.8* 10.3* 10.2* 11.4*  HCT 33.5* 34.4* 33.2* 30.8* 31.0* 34.8*  MCV 95.2 94.0 95.1 93.1 94.5 95.1  PLT 219 280 247 266 300 161   Basic Metabolic Panel: Recent Labs  Lab 03/05/21 1609 03/07/21 0349 03/08/21 0341 03/09/21 0435 03/10/21 0355  NA 139 140 139 138 139  K 3.6 3.0* 3.8 3.5 4.1  CL 103 101 104 103 105  CO2 27 25 26 26 27   GLUCOSE 134* 114* 139* 112* 102*  BUN 32* 29* 31* 29* 28*  CREATININE 1.48* 1.21 1.07 1.29* 1.27*  CALCIUM 8.8* 8.7* 8.6* 8.8* 8.8*  MG  --  1.6* 2.2 1.9 1.9  PHOS  --   --  2.6  --   --    GFR: Estimated Creatinine Clearance: 39.6 mL/min (A) (by C-G formula based on SCr of 1.27 mg/dL (H)). Liver Function Tests: Recent Labs  Lab 03/05/21 1609  AST 53*  ALT 27  ALKPHOS 72  BILITOT 0.9  PROT 7.3  ALBUMIN 3.0*   No results for input(s): LIPASE, AMYLASE in the last 168 hours. No results for input(s): AMMONIA in the last 168 hours. Coagulation Profile: No results for  input(s): INR, PROTIME in the last 168 hours. Cardiac Enzymes: No results for input(s): CKTOTAL, CKMB, CKMBINDEX, TROPONINI in the last 168 hours. BNP (last 3 results) Recent Labs    03/26/20 1124  PROBNP 5,675*   HbA1C: No results for input(s): HGBA1C in the last 72 hours. CBG: No results for input(s): GLUCAP in the last 168 hours. Lipid Profile: No results for input(s): CHOL, HDL, LDLCALC, TRIG, CHOLHDL, LDLDIRECT in the last 72 hours. Thyroid Function Tests: No results for input(s): TSH, T4TOTAL, FREET4, T3FREE, THYROIDAB in the last 72 hours. Anemia Panel: No results for input(s): VITAMINB12, FOLATE, FERRITIN, TIBC, IRON, RETICCTPCT in the last 72 hours. Sepsis Labs: No results for input(s): PROCALCITON, LATICACIDVEN in the last 168  hours.  Recent Results (from the past 240 hour(s))  Resp Panel by RT-PCR (Flu A&B, Covid) Nasopharyngeal Swab     Status: None   Collection Time: 03/05/21  6:17 PM   Specimen: Nasopharyngeal Swab; Nasopharyngeal(NP) swabs in vial transport medium  Result Value Ref Range Status   SARS Coronavirus 2 by RT PCR NEGATIVE NEGATIVE Final    Comment: (NOTE) SARS-CoV-2 target nucleic acids are NOT DETECTED.  The SARS-CoV-2 RNA is generally detectable in upper respiratory specimens during the acute phase of infection. The lowest concentration of SARS-CoV-2 viral copies this assay can detect is 138 copies/mL. A negative result does not preclude SARS-Cov-2 infection and should not be used as the sole basis for treatment or other patient management decisions. A negative result may occur with  improper specimen collection/handling, submission of specimen other than nasopharyngeal swab, presence of viral mutation(s) within the areas targeted by this assay, and inadequate number of viral copies(<138 copies/mL). A negative result must be combined with clinical observations, patient history, and epidemiological information. The expected result is Negative.  Fact Sheet for Patients:  EntrepreneurPulse.com.au  Fact Sheet for Healthcare Providers:  IncredibleEmployment.be  This test is no t yet approved or cleared by the [place] FDA and  has been authorized for detection and/or diagnosis of SARS-CoV-2 by FDA under an Emergency Use Authorization (EUA). This EUA will remain  in effect (meaning this test can be used) for the duration of the COVID-19 declaration under Section 564(b)(1) of the Act, 21 U.S.C.section 360bbb-3(b)(1), unless the authorization is terminated  or revoked sooner.       Influenza A by PCR NEGATIVE NEGATIVE Final   Influenza B by PCR NEGATIVE NEGATIVE Final    Comment: (NOTE) The Xpert Xpress SARS-CoV-2/FLU/RSV plus assay is intended  as an aid in the diagnosis of influenza from Nasopharyngeal swab specimens and should not be used as a sole basis for treatment. Nasal washings and aspirates are unacceptable for Xpert Xpress SARS-CoV-2/FLU/RSV testing.  Fact Sheet for Patients: EntrepreneurPulse.com.au  Fact Sheet for Healthcare Providers: IncredibleEmployment.be  This test is not yet approved or cleared by the [place] FDA and has been authorized for detection and/or diagnosis of SARS-CoV-2 by FDA under an Emergency Use Authorization (EUA). This EUA will remain in effect (meaning this test can be used) for the duration of the COVID-19 declaration under Section 564(b)(1) of the Act, 21 U.S.C. section 360bbb-3(b)(1), unless the authorization is terminated or revoked.  Performed at [facility], [place] [address], [place], [place] [address]          Radiology Studies: No results found.      Scheduled Meds:  vitamin C  500 mg Oral Daily   aspirin EC  81 mg Oral Daily   cholecalciferol  2,000 Units Oral Daily   docusate sodium  100 mg Oral Daily   enoxaparin (LOVENOX) injection  40 mg Subcutaneous Q24H   feeding supplement  237 mL Oral TID BM   fluticasone furoate-vilanterol  1 puff Inhalation Daily   furosemide  10 mg Oral Daily   melatonin  3 mg Oral QHS   memantine  10 mg Oral BID   metoprolol succinate  50 mg Oral Daily   And   metoprolol succinate  25 mg Oral QHS   pantoprazole  40 mg Oral Daily   Pirfenidone  267 mg Oral 3 times per day   simvastatin  20 mg Oral q1800   vitamin B-12  1,000 mcg Oral Daily   Continuous Infusions:     LOS: 6 days    Time spent: 35 minutes spent on chart review, discussion with nursing staff, consultants, updating family and interview/physical exam; more than 50% of that time was spent in counseling and/or coordination of care.    Laylamarie [employer] [place] DO [facility] Available via Epic secure  chat 7am-7pm After these hours, please refer to coverage provider listed on [email or web address] 03/11/2021, 12:20 PM

## 2021-03-11 NOTE — NC FL2 (Signed)
[place] LEVEL OF CARE SCREENING TOOL     IDENTIFICATION  Patient Name: Isaac [name] Birthdate: 1940/06/19 Sex: male Admission Date (Current Location): 03/05/2021  [facility] and [place] Number:  Herbalist and Address:  [facility],  [place], [place]      Provider Number: [id number]  Attending Physician Name and Address:  Isaac [name]  Relative Name and Phone Number:  Isaac [name], Isaac [name]   [phone]    Current Level of Care: Hospital Recommended Level of Care: [place] Prior Approval Number:    Date Approved/Denied:   PASRR Number: [id number]  Discharge Plan: SNF    Current Diagnoses: Patient Active Problem List   Diagnosis Date Noted   Acute respiratory failure with hypoxia ([place]) 03/05/2021   Dementia ([place]) 01/08/2021   Chronic diastolic CHF (congestive heart failure) ([place]) 12/07/2020   Paroxysmal atrial fibrillation ([place])    History of TIA (transient ischemic attack) 11/27/2020   Thrombocytopenia ([place]) 11/27/2020   Orthostatic hypotension 11/27/2020   Right inguinal hernia s/p laparoscopic repair 05/28/2017 05/28/2017   Left femoral hernia s/p laparoscopic repair 05/28/2017 05/28/2017   Stage 3 chronic kidney disease ([place]) 05/22/2015   CHF (congestive heart failure), NYHA class III ([place]) 02/20/2014   BPH (benign prostatic hyperplasia) 02/20/2014   T wave inversion in EKG 02/20/2014   Coronary artery disease 11/25/2013   Essential hypertension    Unstable angina ([place]) 11/23/2013   Dyspnea on exertion 11/08/2013   Barrett's esophagus 10/27/2011   GERD (gastroesophageal reflux disease) 07/14/2011   CAD (coronary artery disease), native coronary artery - LIMA-LAD, SVG-Diag, SVG-RCA 2002    Hyperlipidemia LDL goal <70    Hypertensive heart disease     Orientation RESPIRATION BLADDER Height & Weight     Self  Normal Incontinent Weight: 60.4 kg Height:  5' 10.5" (179.1 cm)   BEHAVIORAL SYMPTOMS/MOOD NEUROLOGICAL BOWEL NUTRITION STATUS      Incontinent Diet (Soft)  AMBULATORY STATUS COMMUNICATION OF NEEDS Skin   Extensive Assist Verbally Normal                       Personal Care Assistance Level of Assistance  Bathing, Feeding, Dressing Bathing Assistance: Maximum assistance Feeding assistance: Maximum assistance Dressing Assistance: Maximum assistance     Functional Limitations Info  Sight, Hearing, Speech Sight Info: Adequate Hearing Info: Impaired Speech Info: Adequate    SPECIAL CARE FACTORS FREQUENCY  PT (By licensed PT), OT (By licensed OT)     PT Frequency: x5 week OT Frequency: x5 week            Contractures Contractures Info: Not present    Additional Factors Info  Code Status, Allergies Code Status Info: DNR Allergies Info: No Known Allergies           Current Medications (03/11/2021):  This is the current hospital active medication list Current Facility-Administered Medications  Medication Dose Route Frequency Provider Last Rate Last Admin   acetaminophen (TYLENOL) tablet 1,000 mg  1,000 mg Oral Q6H PRN Isaac [name]       albuterol (PROVENTIL) (2.5 MG/3ML) 0.083% nebulizer solution 3 mL  3 mL Inhalation Q6H PRN Isaac [name]       ascorbic acid (VITAMIN C) tablet 500 mg  500 mg Oral Daily Isaac [name]   500 mg at 03/11/21 1110   aspirin EC tablet 81 mg  81 mg Oral Daily Tu, Ching T,  [name]   81 mg at 03/11/21 1111   cholecalciferol (VITAMIN D) tablet 2,000 Units  2,000 Units Oral Daily Isaac [name]   2,000 Units at 03/11/21 1110   docusate sodium (COLACE) capsule 100 mg  100 mg Oral Daily Isaac [name]   100 mg at 03/11/21 1111   enoxaparin (LOVENOX) injection 40 mg  40 mg Subcutaneous Q24H Isaac [name]   40 mg at 03/10/21 2039   feeding supplement (ENSURE ENLIVE / ENSURE PLUS) liquid 237 mL  237 mL Oral TID BM Isaac [name]   237 mL at 03/11/21 1112   fluticasone furoate-vilanterol (BREO ELLIPTA)  200-25 MCG/ACT 1 puff  1 puff Inhalation Daily Isaac [name]   1 puff at 03/11/21 0926   furosemide (LASIX) tablet 10 mg  10 mg Oral Daily Isaac [name]   10 mg at 03/11/21 1111   melatonin tablet 3 mg  3 mg Oral QHS Isaac [name]   3 mg at 03/10/21 2034   memantine (NAMENDA) tablet 10 mg  10 mg Oral BID Isaac [name]   10 mg at 03/11/21 1111   metoprolol succinate (TOPROL-XL) 24 hr tablet 50 mg  50 mg Oral Daily Isaac [name]   50 mg at 03/11/21 1111   And   metoprolol succinate (TOPROL-XL) 24 hr tablet 25 mg  25 mg Oral QHS Isaac [name]   25 mg at 03/10/21 2035   pantoprazole (PROTONIX) EC tablet 40 mg  40 mg Oral Daily Isaac [name]   40 mg at 03/11/21 1110   Pirfenidone TABS 267 mg  267 mg Oral 3 times per day Isaac [name]   267 mg at 03/11/21 1112   simvastatin (ZOCOR) tablet 20 mg  20 mg Oral q1800 Isaac [name]   20 mg at 03/10/21 1741   vitamin B-12 (CYANOCOBALAMIN) tablet 1,000 mcg  1,000 mcg Oral Daily Isaac [name]   1,000 mcg at 03/11/21 1111     Discharge Medications: Please see discharge summary for a list of discharge medications.  Relevant Imaging Results:  Relevant Lab Results:   Additional Information SS#[id number]  Purcell Mouton, RN

## 2021-03-12 LAB — RESP PANEL BY RT-PCR (FLU A&B, COVID) ARPGX2
Influenza A by PCR: NEGATIVE
Influenza B by PCR: NEGATIVE
SARS Coronavirus 2 by RT PCR: NEGATIVE

## 2021-03-12 MED ORDER — HALOPERIDOL LACTATE 5 MG/ML IJ SOLN
1.0000 mg | Freq: Once | INTRAMUSCULAR | Status: AC
  Administered 2021-03-12: 1 mg via INTRAMUSCULAR
  Filled 2021-03-12: qty 1

## 2021-03-12 NOTE — Progress Notes (Signed)
Attempted to call family twice in regards to implementation of a waist belt restraint per MD order since the patient is repeatedly getting out of bed unsafely even after medication administration. No one picked up. Will attempt to call family again later.

## 2021-03-12 NOTE — Progress Notes (Signed)
Notified MD of patients repeated attempt at getting out of bed unsafely after redirection to the bed and that it was nighttime.

## 2021-03-12 NOTE — Progress Notes (Signed)
MD notified of how patient is still repeatedly trying to get out of bed unsafely even after medication was ordered and administered.

## 2021-03-12 NOTE — Progress Notes (Signed)
PROGRESS NOTE    DUELL HOLDREN  VHQ:[id number] DOB: 1940-11-24 DOA: 03/05/2021 PCP: Dettinger, Fransisca Kaufmann, MD    Brief Narrative:  ERYK [name] is an 80 year old male with past medical history significant for dementia, CAD s/p CABG, chronic diastolic congestive heart failure, pulmonary fibrosis, CKD stage IIIa, TIA, hyperlipidemia, GERD who presents to [facility] ED 11/8 from home with weakness, decreased oral intake, shortness of breath and cough.  Spouse present at bedside provides history given his underlying dementia and altered mental status.  She reports that patient has been sick for the last 2 weeks with decreased oral intake and now with inability to ambulate.  At baseline walks with a cane.  Cough is productive of green sputum with some increased respiratory effort.  No complaints of pain, no nausea/vomiting/diarrhea.  No lower extremity edema.  In the ED, temperature 98.7 F, HR 76, RR 19, BP 143/71, SPO2 88% and placed on 3 L nasal cannula.  Sodium 139, potassium 3.6, chloride 103, CO2 27, glucose 134, BUN 32, creatinine 1.48, AST 53, ALT 27, BNP 662.4.  High sensitive troponin 410> 384.  WBC 13.4, hemoglobin 11.1, platelets 219.  Covid-19 PCR negative.  Influenza A/B PCR negative.  Urinalysis unrevealing.  Chest x-ray with cardiomegaly, bibasilar opacities concerning for atelectasis versus infiltrate.  EKG personally reviewed with NSR, rate 73, QTc 471, no concerning dynamic changes.  EDP started IV Rocephin/azithromycin.  Hospitalist service consulted for further evaluation management of acute metabolic encephalopathy, weakness secondary to community acquired pneumonia.   Assessment & Plan:   Principal Problem:   Acute respiratory failure with hypoxia (HCC) Active Problems:   Hyperlipidemia LDL goal <70   CAD (coronary artery disease), native coronary artery - LIMA-LAD, SVG-Diag, SVG-RCA 2002   Essential hypertension   Chronic diastolic CHF (congestive heart failure) (HCC)   Acute  hypoxic respiratory failure, POA resolved Community acquired pneumonia Patient presenting to ED with 2-week history of progressive weakness, productive cough with green sputum.  Elevated white blood cell count of 14.4 with chest x-ray findings consistent with pneumonia.  Patient is known to be hypoxic with SPO2 88% on room air requiring 3 L nasal cannula to maintain adequate oxygenation.  Suspect etiology atypical versus gram-negative organism.  Completed 5-day course of azithromycin and now titrated off of submental oxygen. --Supportive care   Elevated troponin High sensitive troponin peaked at 410 followed by 384.  EKG with no concerning dynamic changes.  Etiology likely secondary to type II demand ischemia in the setting of pneumonia as above. --Continue to monitor on telemetry  Essential hypertension Chronic diastolic congestive heart failure, compensated Home regimen includes metoprolol succinate 50 mg p.o. qAM, 25 mg p.o. qHS, furosemide 40 mg as needed for fluid/edema. --Metoprolol succinate 50 mg PO qAm and 25mg  PO qPM --Lasix 10 mg PO daily --Strict I's and O's and daily weights  CAD s/p CABG --Continue aspirin and statin  Hyperlipidemia: Simvastatin 20 mg p.o. daily  Pulmonary fibrosis --Continue home Pirfenidone 267 mg TID  GERD On omeprazole 20 mg p.o. daily at home. --Continue Protonix 40 mg p.o. daily as hospital substitution  Dementia --Memantine 10 mg p.o. twice daily --melatonin 3mg  PO qHS --Delirium precautions --Get up during the day --Encourage a familiar face to remain present throughout the day --Keep blinds open and lights on during daylight hours --Minimize the use of opioids/benzodiazepines  Weakness/debility/deconditioning: --PT now recommending SNF on discharge, TOC for placement --Continue therapy efforts while inpatient    DVT prophylaxis: enoxaparin (LOVENOX) injection 40  mg Start: 03/05/21 2200   Code Status: DNR Family Communication: No  family present at bedside this morning.  Disposition Plan: SNF Level of care: Telemetry Status is: Inpatient  Remains inpatient appropriate because: IV antibiotics, pending SNF placement  Consultants:  None  Procedures:  None  Antimicrobials:  Azithromycin 11/8 - 11/12 Ceftriaxone 11/8 - 11/12   Subjective: Patient seen examined bedside, resting comfortably.  Lying in bed, pleasantly confused.  Eating breakfast.  No family present this morning.  No specific complaints.  Denies headache, no chest pain, no shortness of breath, no abdominal pain.  No acute events overnight per nursing staff.  Awaiting SNF placement.  Medically stable for discharge.    Objective: Vitals:   03/12/21 0323 03/12/21 0815 03/12/21 0903 03/12/21 1010  BP:   (!) 89/65 (!) 109/54  Pulse:    70  Resp:    18  Temp:    97.8 F (36.6 C)  TempSrc:    Axillary  SpO2:  96%  95%  Weight: 59.6 kg     Height:        Intake/Output Summary (Last 24 hours) at 03/12/2021 1415 Last data filed at 03/12/2021 0335 Gross per 24 hour  Intake 1 ml  Output 450 ml  Net -449 ml   Filed Weights   03/10/21 0500 03/11/21 0500 03/12/21 0323  Weight: 60.3 kg 60.4 kg 59.6 kg    Examination:  General exam: Appears calm and comfortable, pleasantly confused Respiratory system: Breath sounds slightly decreased bilateral bases, normal respiratory effort, on room air at rest Cardiovascular system: S1 & S2 heard, RRR. No JVD, murmurs, rubs, gallops or clicks. No pedal edema. Gastrointestinal system: Abdomen is nondistended, soft and nontender. No organomegaly or masses felt. Normal bowel sounds heard. Central nervous system: Alert, oriented to time (2022), and person Airline pilot) but not place ("I don't know"). No focal neurological deficits. Extremities: Symmetric 5 x 5 power. Skin: No rashes, lesions or ulcers Psychiatry: Judgement and insight appear poor. Mood & affect appropriate.     Data Reviewed: I have  personally reviewed following labs and imaging studies  CBC: Recent Labs  Lab 03/05/21 1609 03/06/21 0639 03/07/21 0349 03/08/21 0341 03/09/21 0435 03/10/21 0355  WBC 13.4* 14.4* 13.9* 12.4* 10.4 9.0  NEUTROABS 11.2*  --   --   --   --   --   HGB 11.1* 11.5* 10.8* 10.3* 10.2* 11.4*  HCT 33.5* 34.4* 33.2* 30.8* 31.0* 34.8*  MCV 95.2 94.0 95.1 93.1 94.5 95.1  PLT 219 280 247 266 300 540   Basic Metabolic Panel: Recent Labs  Lab 03/05/21 1609 03/07/21 0349 03/08/21 0341 03/09/21 0435 03/10/21 0355  NA 139 140 139 138 139  K 3.6 3.0* 3.8 3.5 4.1  CL 103 101 104 103 105  CO2 27 25 26 26 27   GLUCOSE 134* 114* 139* 112* 102*  BUN 32* 29* 31* 29* 28*  CREATININE 1.48* 1.21 1.07 1.29* 1.27*  CALCIUM 8.8* 8.7* 8.6* 8.8* 8.8*  MG  --  1.6* 2.2 1.9 1.9  PHOS  --   --  2.6  --   --    GFR: Estimated Creatinine Clearance: 39.1 mL/min (A) (by C-G formula based on SCr of 1.27 mg/dL (H)). Liver Function Tests: Recent Labs  Lab 03/05/21 1609  AST 53*  ALT 27  ALKPHOS 72  BILITOT 0.9  PROT 7.3  ALBUMIN 3.0*   No results for input(s): LIPASE, AMYLASE in the last 168 hours. No results for input(s):  AMMONIA in the last 168 hours. Coagulation Profile: No results for input(s): INR, PROTIME in the last 168 hours. Cardiac Enzymes: No results for input(s): CKTOTAL, CKMB, CKMBINDEX, TROPONINI in the last 168 hours. BNP (last 3 results) Recent Labs    03/26/20 1124  PROBNP 5,675*   HbA1C: No results for input(s): HGBA1C in the last 72 hours. CBG: No results for input(s): GLUCAP in the last 168 hours. Lipid Profile: No results for input(s): CHOL, HDL, LDLCALC, TRIG, CHOLHDL, LDLDIRECT in the last 72 hours. Thyroid Function Tests: No results for input(s): TSH, T4TOTAL, FREET4, T3FREE, THYROIDAB in the last 72 hours. Anemia Panel: No results for input(s): VITAMINB12, FOLATE, FERRITIN, TIBC, IRON, RETICCTPCT in the last 72 hours. Sepsis Labs: No results for input(s):  PROCALCITON, LATICACIDVEN in the last 168 hours.  Recent Results (from the past 240 hour(s))  Resp Panel by RT-PCR (Flu A&B, Covid) Nasopharyngeal Swab     Status: None   Collection Time: 03/05/21  6:17 PM   Specimen: Nasopharyngeal Swab; Nasopharyngeal(NP) swabs in vial transport medium  Result Value Ref Range Status   SARS Coronavirus 2 by RT PCR NEGATIVE NEGATIVE Final    Comment: (NOTE) SARS-CoV-2 target nucleic acids are NOT DETECTED.  The SARS-CoV-2 RNA is generally detectable in upper respiratory specimens during the acute phase of infection. The lowest concentration of SARS-CoV-2 viral copies this assay can detect is 138 copies/mL. A negative result does not preclude SARS-Cov-2 infection and should not be used as the sole basis for treatment or other patient management decisions. A negative result may occur with  improper specimen collection/handling, submission of specimen other than nasopharyngeal swab, presence of viral mutation(s) within the areas targeted by this assay, and inadequate number of viral copies(<138 copies/mL). A negative result must be combined with clinical observations, patient history, and epidemiological information. The expected result is Negative.  Fact Sheet for Patients:  EntrepreneurPulse.com.au  Fact Sheet for Healthcare Providers:  IncredibleEmployment.be  This test is no t yet approved or cleared by the [place] FDA and  has been authorized for detection and/or diagnosis of SARS-CoV-2 by FDA under an Emergency Use Authorization (EUA). This EUA will remain  in effect (meaning this test can be used) for the duration of the COVID-19 declaration under Section 564(b)(1) of the Act, 21 U.S.C.section 360bbb-3(b)(1), unless the authorization is terminated  or revoked sooner.       Influenza A by PCR NEGATIVE NEGATIVE Final   Influenza B by PCR NEGATIVE NEGATIVE Final    Comment: (NOTE) The Xpert Xpress  SARS-CoV-2/FLU/RSV plus assay is intended as an aid in the diagnosis of influenza from Nasopharyngeal swab specimens and should not be used as a sole basis for treatment. Nasal washings and aspirates are unacceptable for Xpert Xpress SARS-CoV-2/FLU/RSV testing.  Fact Sheet for Patients: EntrepreneurPulse.com.au  Fact Sheet for Healthcare Providers: IncredibleEmployment.be  This test is not yet approved or cleared by the [place] FDA and has been authorized for detection and/or diagnosis of SARS-CoV-2 by FDA under an Emergency Use Authorization (EUA). This EUA will remain in effect (meaning this test can be used) for the duration of the COVID-19 declaration under Section 564(b)(1) of the Act, 21 U.S.C. section 360bbb-3(b)(1), unless the authorization is terminated or revoked.  Performed at [facility], [place] [address] [place], [place] [address]          Radiology Studies: No results found.      Scheduled Meds:  vitamin C  500 mg Oral Daily  aspirin EC  81 mg Oral Daily   cholecalciferol  2,000 Units Oral Daily   docusate sodium  100 mg Oral Daily   enoxaparin (LOVENOX) injection  40 mg Subcutaneous Q24H   feeding supplement  237 mL Oral TID BM   fluticasone furoate-vilanterol  1 puff Inhalation Daily   furosemide  10 mg Oral Daily   melatonin  3 mg Oral QHS   memantine  10 mg Oral BID   metoprolol succinate  50 mg Oral Daily   And   metoprolol succinate  25 mg Oral QHS   pantoprazole  40 mg Oral Daily   Pirfenidone  267 mg Oral 3 times per day   simvastatin  20 mg Oral q1800   vitamin B-12  1,000 mcg Oral Daily   Continuous Infusions:     LOS: 7 days    Time spent: 35 minutes spent on chart review, discussion with nursing staff, consultants, updating family and interview/physical exam; more than 50% of that time was spent in counseling and/or coordination of care.    Esli Jernigan J [place] DO [facility] Available via Epic secure chat 7am-7pm After these hours, please refer to coverage provider listed on [email or web address] 03/12/2021, 2:15 PM

## 2021-03-12 NOTE — TOC Progression Note (Signed)
Transition of Care [facility]) - Progression Note    Patient Details  Name: Isaac [name] MRN: [id number] Date of Birth: May 23, 1940  [facility] CM/SW Contact  Purcell Mouton, RN Phone Number: 03/12/2021, 2:44 PM  Clinical Narrative:    Wife accepted [employer]. Starting insurance auth and pt will need COVID test.    Expected Discharge Plan: [place] Barriers to Discharge: No Barriers Identified  Expected Discharge Plan and Services Expected Discharge Plan: [place]   Discharge Planning Services: CM Consult   Living arrangements for the past 2 months: Single Family Home                                       Social Determinants of Health (SDOH) Interventions    Readmission Risk Interventions Readmission Risk Prevention Plan 12/03/2020  Post Dischage Appt Complete  Medication Screening Complete  Transportation Screening Complete  Some recent data might be hidden

## 2021-03-13 DIAGNOSIS — R531 Weakness: Secondary | ICD-10-CM

## 2021-03-13 LAB — URINALYSIS, ROUTINE W REFLEX MICROSCOPIC
Bilirubin Urine: NEGATIVE
Glucose, UA: NEGATIVE mg/dL
Hgb urine dipstick: NEGATIVE
Ketones, ur: NEGATIVE mg/dL
Leukocytes,Ua: NEGATIVE
Nitrite: NEGATIVE
Protein, ur: NEGATIVE mg/dL
Specific Gravity, Urine: 1.015 (ref 1.005–1.030)
pH: 6 (ref 5.0–8.0)

## 2021-03-13 LAB — BASIC METABOLIC PANEL
Anion gap: 9 (ref 5–15)
BUN: 34 mg/dL — ABNORMAL HIGH (ref 8–23)
CO2: 27 mmol/L (ref 22–32)
Calcium: 10 mg/dL (ref 8.9–10.3)
Chloride: 105 mmol/L (ref 98–111)
Creatinine, Ser: 1.46 mg/dL — ABNORMAL HIGH (ref 0.61–1.24)
GFR, Estimated: 48 mL/min — ABNORMAL LOW (ref >60)
Glucose, Bld: 118 mg/dL — ABNORMAL HIGH (ref 70–99)
Potassium: 4.1 mmol/L (ref 3.5–5.1)
Sodium: 141 mmol/L (ref 135–145)

## 2021-03-13 MED ORDER — HALOPERIDOL 1 MG PO TABS
1.0000 mg | ORAL_TABLET | Freq: Once | ORAL | Status: DC
  Filled 2021-03-13: qty 1

## 2021-03-13 MED ORDER — HALOPERIDOL LACTATE 5 MG/ML IJ SOLN: 1.0000 mg | Freq: Once | INTRAMUSCULAR | Status: DC

## 2021-03-13 MED ORDER — HYDROXYZINE HCL 25 MG PO TABS
25.0000 mg | ORAL_TABLET | Freq: Three times a day (TID) | ORAL | Status: DC | PRN
  Administered 2021-03-13: 25 mg via ORAL
  Filled 2021-03-13: qty 1

## 2021-03-13 MED ORDER — HALOPERIDOL LACTATE 5 MG/ML IJ SOLN
INTRAMUSCULAR | Status: AC
  Administered 2021-03-13: 1 mg
  Filled 2021-03-13: qty 1

## 2021-03-13 MED ORDER — SODIUM CHLORIDE 0.9 % IV SOLN: INTRAVENOUS | Status: DC

## 2021-03-13 MED ORDER — ENSURE ENLIVE PO LIQD: 237.0000 mL | Freq: Three times a day (TID) | ORAL | 12 refills | Status: AC

## 2021-03-13 NOTE — Progress Notes (Signed)
  Pt is very restless and trying to climb out the bed. Wife was at the bedside, pt is so delirious and does not follow commands and unable to be redirected. MD aware. Atarax prn ordered. Will cont to monitor.

## 2021-03-13 NOTE — Discharge Summary (Signed)
Physician Discharge Summary  Isaac [name] GGY:[id number] DOB: Mar 05, 1941 DOA: 03/05/2021  PCP: Dettinger, Fransisca Kaufmann, MD  Admit date: 03/05/2021 Discharge date: 03/14/2021  Admitted From: Home.  Disposition:  SNF  Recommendations for Outpatient Follow-up:  Follow up with PCP in 1-2 weeks Please obtain BMP/CBC tomorrow to make sure his creatinine is improving.  Please follow up with outpatient palliative care services.   Discharge Condition: guarded.  CODE STATUS:DNR Diet recommendation: Heart Healthy  Brief/Interim Summary:  Isaac [name] is an 80 year old male with past medical history significant for dementia, CAD s/p CABG, chronic diastolic congestive heart failure, pulmonary fibrosis, CKD stage IIIa, TIA, hyperlipidemia, GERD who presents to [facility] ED 11/8 from home with weakness, decreased oral intake, shortness of breath and cough.  Spouse present at bedside provides history given his underlying dementia and altered mental status.  She reports that patient has been sick for the last 2 weeks with decreased oral intake and now with inability to ambulate.  At baseline walks with a cane.  Cough is productive of green sputum with some increased respiratory effort.  No complaints of pain, no nausea/vomiting/diarrhea.  No lower extremity edema.    Discharge Diagnoses:  Principal Problem:   Acute respiratory failure with hypoxia ([place]) Active Problems:   Hyperlipidemia LDL goal <70   CAD (coronary artery disease), native coronary artery - LIMA-LAD, SVG-Diag, SVG-RCA 2002   Essential hypertension   Chronic diastolic CHF (congestive heart failure) (HCC)  Acute hypoxic respiratory failure, POA resolved Community acquired pneumonia Patient presenting to ED with 2-week history of progressive weakness, productive cough with green sputum.  Elevated white blood cell count of 14.4 with chest x-ray findings consistent with pneumonia.  Patient is known to be hypoxic with SPO2 88% on room air requiring  3 L nasal cannula to maintain adequate oxygenation.  Suspect etiology atypical versus gram-negative organism.  Completed 5-day course of azithromycin and now titrated off of submental oxygen. --Supportive care    Elevated troponin Demand ischemia from pneumonia.  No chest pain.  Cotinue to monitor.   AKI:  probably secondary to poor oral intake , and lasix.  Discontinued the LASIX.  UA is negative for infection.  Creatinine has  plateaued and slowly improving.  Recommend checking creatinine in 1 to 2 days.     Essential hypertension Chronic diastolic congestive heart failure, compensated Holding lasix due to AKI. [name] --Metoprolol succinate 50 mg PO daily.  Please follow up with BMP to make sure his creatinine is improving. When his creatinine improves please slowly restart the lasix at a lower dose.      CAD s/p CABG --Continue aspirin and statin   Hyperlipidemia: Simvastatin 20 mg p.o. daily   Pulmonary fibrosis --Continue home Pirfenidone 267 mg TID   GERD Stable.     Acute metabolic encephalopathy in the setting of dementia probably from delirium and mild AKI --Memantine 10 mg p.o. twice daily. Gentle hydration and creatinine has stabilized and started to slowly improve.  - UA is negative for infection.    Weakness/debility/deconditioning: --PT now recommending SNF on discharge, TOC for placement     in view of his worsening dementia, poor progression and multiple medical problems, recommend palliative care consult, . Wife is open to hospice services. Will request outpatient palliative follow up .    Discharge Instructions  Discharge Instructions     Diet - low sodium heart healthy   Complete by: As directed    Diet - low sodium heart healthy  Complete by: As directed    Discharge instructions   Complete by: As directed    Please follow up with PCp in one week.  Please check bmp tomorrow.   Discharge instructions   Complete by: As directed    Please  follow up with PCP in one week.   Discharge wound care:   Complete by: As directed    Apply barrier cream and antifungal powder to buttocks with each turning and cleaning session.      Allergies as of 03/14/2021   No Known Allergies      Medication List     STOP taking these medications    furosemide 40 MG tablet Commonly known as: LASIX   potassium chloride SA 20 MEQ tablet Commonly known as: Klor-Con M20   QUEtiapine 25 MG tablet Commonly known as: SEROQUEL       TAKE these medications    acetaminophen 500 MG tablet Commonly known as: TYLENOL Take 1,000 mg by mouth every 6 (six) hours as needed for headache.   albuterol 108 (90 Base) MCG/ACT inhaler Commonly known as: VENTOLIN HFA Inhale 2 puffs into the lungs every 6 (six) hours as needed for wheezing or shortness of breath.   aspirin 81 MG EC tablet Take 81 mg by mouth every morning.   docusate sodium 100 MG capsule Commonly known as: COLACE Take 100 mg by mouth daily. Take 1 capsule by mouth every other day   Esbriet 267 MG Tabs Generic drug: Pirfenidone TAKE 2 TABLETS (534 MG TOTAL) BY MOUTH IN THE MORNING, AT NOON, AND AT BEDTIME. What changed:  how much to take how to take this when to take this   feeding supplement Liqd Take 237 mLs by mouth 3 (three) times daily between meals.   fluticasone 50 MCG/ACT nasal spray Commonly known as: FLONASE Place 2 sprays into both nostrils daily as needed for allergies or rhinitis.   fluticasone-salmeterol 250-50 MCG/ACT Aepb Commonly known as: ADVAIR INHALE 1 DOSE BY MOUTH TWICE DAILY What changed: See the new instructions.   hydrOXYzine 25 MG tablet Commonly known as: ATARAX/VISTARIL Take 1 tablet (25 mg total) by mouth 3 (three) times daily as needed for anxiety.   memantine 10 MG tablet Commonly known as: Namenda Take 1 tablet (10 mg total) by mouth 2 (two) times daily. Start after finishing the namenda starter pack first What changed: additional  instructions   metoprolol succinate 50 MG 24 hr tablet Commonly known as: TOPROL-XL Take 1 tablet (50 mg total) by mouth daily. What changed:  how much to take when to take this additional instructions   multivitamin with minerals Tabs tablet Take 1 tablet by mouth daily.   multivitamin-lutein Caps capsule Take 1 capsule by mouth daily.   nitroGLYCERIN 0.4 MG SL tablet Commonly known as: NITROSTAT DISSOLVE ONE TABLET UNDER THE TONGUE EVERY 5 MINUTES AS NEEDED FOR CHEST PAIN.  DO NOT EXCEED A TOTAL OF 3 DOSES IN 15 MINUTES What changed: See the new instructions.   omeprazole 20 MG capsule Commonly known as: PRILOSEC Take 1 capsule by mouth twice daily What changed: when to take this   ondansetron 4 MG tablet Commonly known as: ZOFRAN TAKE 1 TABLET BY MOUTH THREE TIMES DAILY AS NEEDED FOR NAUSEA AND VOMITING What changed: See the new instructions.   simethicone 125 MG chewable tablet Commonly known as: MYLICON Chew 326 mg by mouth every 6 (six) hours as needed for flatulence.   simvastatin 20 MG tablet Commonly known as: ZOCOR  TAKE 1 TABLET BY MOUTH AT BEDTIME What changed: when to take this   vitamin B-12 1000 MCG tablet Commonly known as: CYANOCOBALAMIN Take 1 tablet (1,000 mcg total) by mouth daily.   vitamin C 500 MG tablet Commonly known as: ASCORBIC ACID Take 500 mg by mouth daily.   Vitamin D 50 MCG (2000 UT) tablet Take 2,000 Units by mouth daily.               Discharge Care Instructions  (From admission, onward)           Start     Ordered   03/14/21 0000  Discharge wound care:       Comments: Apply barrier cream and antifungal powder to buttocks with each turning and cleaning session.   03/14/21 1300            Contact information for after-discharge care     Destination     [facility] .   Service: Skilled Nursing Contact information: [place] [phone]                     No Known Allergies  Consultations: None.    Procedures/Studies: DG Chest 2 View  Result Date: 03/05/2021 CLINICAL DATA:  Shortness of breath, productive cough, fatigue. EXAM: CHEST - 2 VIEW COMPARISON:  Chest radiograph dated December 07, 2020 FINDINGS: The heart is enlarged. Evidence of prior coronary artery bypass grafting. Hyperinflated lungs with bibasilar opacities concerning for atelectasis or infiltrate. Follow-up examination to resolution is recommended IMPRESSION: 1.  Cardiomegaly. 2.  Bibasilar opacities concerning for atelectasis or infiltrate. Electronically Signed   By: Keane Police D.O.   On: 03/05/2021 16:34   DG CHEST PORT 1 VIEW  Result Date: 03/08/2021 CLINICAL DATA:  Shortness of breath EXAM: PORTABLE CHEST 1 VIEW COMPARISON:  Prior chest x-ray 03/05/2021 FINDINGS: Stable cardiomegaly. Patient is status post median sternotomy with evidence of prior multivessel CABG. Progressive airspace opacities in both lung bases with greater obscuration of the diaphragmatic shadows. Small amount of fluid versus atelectasis along the minor fissure on the right. Perhaps slightly increased pulmonary vascular congestion. No pneumothorax. No acute osseous abnormality. IMPRESSION: 1. Progressive bibasilar nonspecific airspace opacities. Differential considerations include increasing bibasilar atelectasis, aspiration, or multi lobar pneumonia. 2. Cardiomegaly with mild pulmonary vascular congestion but no overt edema. Electronically Signed   By: Jacqulynn Cadet M.D.   On: 03/08/2021 05:44      Subjective: Sleepy from the haldol given yesterday evening.   Discharge Exam: Vitals:   03/13/21 2304 03/14/21 0537  BP:  (!) 147/59  Pulse: 81 66  Resp:  14  Temp:  98.3 F (36.8 C)  SpO2:  95%   Vitals:   03/13/21 2303 03/13/21 2304 03/14/21 0438 03/14/21 0537  BP: (!) 167/70   (!) 147/59  Pulse:  81  66  Resp:    14  Temp:    98.3 F (36.8 C)  TempSrc:    Oral  SpO2:     95%  Weight:   59.8 kg   Height:        General: Pt is sleepy, but not in distress.  Cardiovascular: RRR, S1/S2 +, no rubs, no gallops Respiratory: CTA bilaterally, no wheezing, no rhonchi Abdominal: Soft, NT, ND, bowel sounds + Extremities: no edema, no cyanosis    The results of significant diagnostics from this hospitalization (including imaging, microbiology, ancillary and laboratory) are listed below for reference.  Microbiology: Recent Results (from the past 240 hour(s))  Resp Panel by RT-PCR (Flu A&B, Covid) Nasopharyngeal Swab     Status: None   Collection Time: 03/05/21  6:17 PM   Specimen: Nasopharyngeal Swab; Nasopharyngeal(NP) swabs in vial transport medium  Result Value Ref Range Status   SARS Coronavirus 2 by RT PCR NEGATIVE NEGATIVE Final    Comment: (NOTE) SARS-CoV-2 target nucleic acids are NOT DETECTED.  The SARS-CoV-2 RNA is generally detectable in upper respiratory specimens during the acute phase of infection. The lowest concentration of SARS-CoV-2 viral copies this assay can detect is 138 copies/mL. A negative result does not preclude SARS-Cov-2 infection and should not be used as the sole basis for treatment or other patient management decisions. A negative result may occur with  improper specimen collection/handling, submission of specimen other than nasopharyngeal swab, presence of viral mutation(s) within the areas targeted by this assay, and inadequate number of viral copies(<138 copies/mL). A negative result must be combined with clinical observations, patient history, and epidemiological information. The expected result is Negative.  Fact Sheet for Patients:  EntrepreneurPulse.com.au  Fact Sheet for Healthcare Providers:  IncredibleEmployment.be  This test is no t yet approved or cleared by the [place] FDA and  has been authorized for detection and/or diagnosis of SARS-CoV-2 by FDA under an  Emergency Use Authorization (EUA). This EUA will remain  in effect (meaning this test can be used) for the duration of the COVID-19 declaration under Section 564(b)(1) of the Act, 21 U.S.C.section 360bbb-3(b)(1), unless the authorization is terminated  or revoked sooner.       Influenza A by PCR NEGATIVE NEGATIVE Final   Influenza B by PCR NEGATIVE NEGATIVE Final    Comment: (NOTE) The Xpert Xpress SARS-CoV-2/FLU/RSV plus assay is intended as an aid in the diagnosis of influenza from Nasopharyngeal swab specimens and should not be used as a sole basis for treatment. Nasal washings and aspirates are unacceptable for Xpert Xpress SARS-CoV-2/FLU/RSV testing.  Fact Sheet for Patients: EntrepreneurPulse.com.au  Fact Sheet for Healthcare Providers: IncredibleEmployment.be  This test is not yet approved or cleared by the [place] FDA and has been authorized for detection and/or diagnosis of SARS-CoV-2 by FDA under an Emergency Use Authorization (EUA). This EUA will remain in effect (meaning this test can be used) for the duration of the COVID-19 declaration under Section 564(b)(1) of the Act, 21 U.S.C. section 360bbb-3(b)(1), unless the authorization is terminated or revoked.  Performed at [facility], [place] [address] [place], [place] [address]   Resp Panel by RT-PCR (Flu A&B, Covid) Nasopharyngeal Swab     Status: None   Collection Time: 03/12/21  3:47 PM   Specimen: Nasopharyngeal Swab; Nasopharyngeal(NP) swabs in vial transport medium  Result Value Ref Range Status   SARS Coronavirus 2 by RT PCR NEGATIVE NEGATIVE Final    Comment: (NOTE) SARS-CoV-2 target nucleic acids are NOT DETECTED.  The SARS-CoV-2 RNA is generally detectable in upper respiratory specimens during the acute phase of infection. The lowest concentration of SARS-CoV-2 viral copies this assay can detect is 138 copies/mL. A negative result does not  preclude SARS-Cov-2 infection and should not be used as the sole basis for treatment or other patient management decisions. A negative result may occur with  improper specimen collection/handling, submission of specimen other than nasopharyngeal swab, presence of viral mutation(s) within the areas targeted by this assay, and inadequate number of viral copies(<138 copies/mL). A negative result must be combined with clinical observations, patient history,  and epidemiological information. The expected result is Negative.  Fact Sheet for Patients:  EntrepreneurPulse.com.au  Fact Sheet for Healthcare Providers:  IncredibleEmployment.be  This test is no t yet approved or cleared by the [place] FDA and  has been authorized for detection and/or diagnosis of SARS-CoV-2 by FDA under an Emergency Use Authorization (EUA). This EUA will remain  in effect (meaning this test can be used) for the duration of the COVID-19 declaration under Section 564(b)(1) of the Act, 21 U.S.C.section 360bbb-3(b)(1), unless the authorization is terminated  or revoked sooner.       Influenza A by PCR NEGATIVE NEGATIVE Final   Influenza B by PCR NEGATIVE NEGATIVE Final    Comment: (NOTE) The Xpert Xpress SARS-CoV-2/FLU/RSV plus assay is intended as an aid in the diagnosis of influenza from Nasopharyngeal swab specimens and should not be used as a sole basis for treatment. Nasal washings and aspirates are unacceptable for Xpert Xpress SARS-CoV-2/FLU/RSV testing.  Fact Sheet for Patients: EntrepreneurPulse.com.au  Fact Sheet for Healthcare Providers: IncredibleEmployment.be  This test is not yet approved or cleared by the [place] FDA and has been authorized for detection and/or diagnosis of SARS-CoV-2 by FDA under an Emergency Use Authorization (EUA). This EUA will remain in effect (meaning this test can be used) for the  duration of the COVID-19 declaration under Section 564(b)(1) of the Act, 21 U.S.C. section 360bbb-3(b)(1), unless the authorization is terminated or revoked.  Performed at [facility], [place] [address] [place], [place] [address]      Labs: BNP (last 3 results) Recent Labs    09/13/20 1443 03/05/21 1609 03/08/21 0341  BNP 161.6* 662.4* 710.6*   Basic Metabolic Panel: Recent Labs  Lab 03/08/21 0341 03/09/21 0435 03/10/21 0355 03/13/21 0348 03/14/21 0334  NA 139 138 139 141 142  K 3.8 3.5 4.1 4.1 4.2  CL 104 103 105 105 107  CO2 26 26 27 27 27   GLUCOSE 139* 112* 102* 118* 146*  BUN 31* 29* 28* 34* 34*  CREATININE 1.07 1.29* 1.27* 1.46* 1.41*  CALCIUM 8.6* 8.8* 8.8* 10.0 10.0  MG 2.2 1.9 1.9  --  2.2  PHOS 2.6  --   --   --   --    Liver Function Tests: No results for input(s): AST, ALT, ALKPHOS, BILITOT, PROT, ALBUMIN in the last 168 hours. No results for input(s): LIPASE, AMYLASE in the last 168 hours. No results for input(s): AMMONIA in the last 168 hours. CBC: Recent Labs  Lab 03/08/21 0341 03/09/21 0435 03/10/21 0355  WBC 12.4* 10.4 9.0  HGB 10.3* 10.2* 11.4*  HCT 30.8* 31.0* 34.8*  MCV 93.1 94.5 95.1  PLT 266 300 327   Cardiac Enzymes: No results for input(s): CKTOTAL, CKMB, CKMBINDEX, TROPONINI in the last 168 hours. BNP: Invalid input(s): POCBNP CBG: No results for input(s): GLUCAP in the last 168 hours. D-Dimer No results for input(s): DDIMER in the last 72 hours. Hgb A1c No results for input(s): HGBA1C in the last 72 hours. Lipid Profile No results for input(s): CHOL, HDL, LDLCALC, TRIG, CHOLHDL, LDLDIRECT in the last 72 hours. Thyroid function studies No results for input(s): TSH, T4TOTAL, T3FREE, THYROIDAB in the last 72 hours.  Invalid input(s): FREET3 Anemia work up No results for input(s): VITAMINB12, FOLATE, FERRITIN, TIBC, IRON, RETICCTPCT in the last 72 hours. Urinalysis    Component Value Date/Time    COLORURINE YELLOW 03/13/2021 1436   APPEARANCEUR CLEAR 03/13/2021 1436   APPEARANCEUR Clear 04/11/2020 1035  LABSPEC 1.015 03/13/2021 1436   PHURINE 6.0 03/13/2021 1436   GLUCOSEU NEGATIVE 03/13/2021 1436   GLUCOSEU NEGATIVE 09/06/2012 0925   HGBUR NEGATIVE 03/13/2021 1436   BILIRUBINUR NEGATIVE 03/13/2021 1436   BILIRUBINUR Negative 04/11/2020 1035   KETONESUR NEGATIVE 03/13/2021 1436   PROTEINUR NEGATIVE 03/13/2021 1436   UROBILINOGEN 0.2 09/06/2012 0925   NITRITE NEGATIVE 03/13/2021 1436   LEUKOCYTESUR NEGATIVE 03/13/2021 1436   Sepsis Labs Invalid input(s): PROCALCITONIN,  WBC,  LACTICIDVEN Microbiology Recent Results (from the past 240 hour(s))  Resp Panel by RT-PCR (Flu A&B, Covid) Nasopharyngeal Swab     Status: None   Collection Time: 03/05/21  6:17 PM   Specimen: Nasopharyngeal Swab; Nasopharyngeal(NP) swabs in vial transport medium  Result Value Ref Range Status   SARS Coronavirus 2 by RT PCR NEGATIVE NEGATIVE Final    Comment: (NOTE) SARS-CoV-2 target nucleic acids are NOT DETECTED.  The SARS-CoV-2 RNA is generally detectable in upper respiratory specimens during the acute phase of infection. The lowest concentration of SARS-CoV-2 viral copies this assay can detect is 138 copies/mL. A negative result does not preclude SARS-Cov-2 infection and should not be used as the sole basis for treatment or other patient management decisions. A negative result may occur with  improper specimen collection/handling, submission of specimen other than nasopharyngeal swab, presence of viral mutation(s) within the areas targeted by this assay, and inadequate number of viral copies(<138 copies/mL). A negative result must be combined with clinical observations, patient history, and epidemiological information. The expected result is Negative.  Fact Sheet for Patients:  EntrepreneurPulse.com.au  Fact Sheet for Healthcare Providers:   IncredibleEmployment.be  This test is no t yet approved or cleared by the [place] FDA and  has been authorized for detection and/or diagnosis of SARS-CoV-2 by FDA under an Emergency Use Authorization (EUA). This EUA will remain  in effect (meaning this test can be used) for the duration of the COVID-19 declaration under Section 564(b)(1) of the Act, 21 U.S.C.section 360bbb-3(b)(1), unless the authorization is terminated  or revoked sooner.       Influenza A by PCR NEGATIVE NEGATIVE Final   Influenza B by PCR NEGATIVE NEGATIVE Final    Comment: (NOTE) The Xpert Xpress SARS-CoV-2/FLU/RSV plus assay is intended as an aid in the diagnosis of influenza from Nasopharyngeal swab specimens and should not be used as a sole basis for treatment. Nasal washings and aspirates are unacceptable for Xpert Xpress SARS-CoV-2/FLU/RSV testing.  Fact Sheet for Patients: EntrepreneurPulse.com.au  Fact Sheet for Healthcare Providers: IncredibleEmployment.be  This test is not yet approved or cleared by the [place] FDA and has been authorized for detection and/or diagnosis of SARS-CoV-2 by FDA under an Emergency Use Authorization (EUA). This EUA will remain in effect (meaning this test can be used) for the duration of the COVID-19 declaration under Section 564(b)(1) of the Act, 21 U.S.C. section 360bbb-3(b)(1), unless the authorization is terminated or revoked.  Performed at [facility], [place] [address] [place], [place] [address]   Resp Panel by RT-PCR (Flu A&B, Covid) Nasopharyngeal Swab     Status: None   Collection Time: 03/12/21  3:47 PM   Specimen: Nasopharyngeal Swab; Nasopharyngeal(NP) swabs in vial transport medium  Result Value Ref Range Status   SARS Coronavirus 2 by RT PCR NEGATIVE NEGATIVE Final    Comment: (NOTE) SARS-CoV-2 target nucleic acids are NOT DETECTED.  The SARS-CoV-2 RNA is generally  detectable in upper respiratory specimens during the acute phase of infection. The lowest concentration  of SARS-CoV-2 viral copies this assay can detect is 138 copies/mL. A negative result does not preclude SARS-Cov-2 infection and should not be used as the sole basis for treatment or other patient management decisions. A negative result may occur with  improper specimen collection/handling, submission of specimen other than nasopharyngeal swab, presence of viral mutation(s) within the areas targeted by this assay, and inadequate number of viral copies(<138 copies/mL). A negative result must be combined with clinical observations, patient history, and epidemiological information. The expected result is Negative.  Fact Sheet for Patients:  EntrepreneurPulse.com.au  Fact Sheet for Healthcare Providers:  IncredibleEmployment.be  This test is no t yet approved or cleared by the [place] FDA and  has been authorized for detection and/or diagnosis of SARS-CoV-2 by FDA under an Emergency Use Authorization (EUA). This EUA will remain  in effect (meaning this test can be used) for the duration of the COVID-19 declaration under Section 564(b)(1) of the Act, 21 U.S.C.section 360bbb-3(b)(1), unless the authorization is terminated  or revoked sooner.       Influenza A by PCR NEGATIVE NEGATIVE Final   Influenza B by PCR NEGATIVE NEGATIVE Final    Comment: (NOTE) The Xpert Xpress SARS-CoV-2/FLU/RSV plus assay is intended as an aid in the diagnosis of influenza from Nasopharyngeal swab specimens and should not be used as a sole basis for treatment. Nasal washings and aspirates are unacceptable for Xpert Xpress SARS-CoV-2/FLU/RSV testing.  Fact Sheet for Patients: EntrepreneurPulse.com.au  Fact Sheet for Healthcare Providers: IncredibleEmployment.be  This test is not yet approved or cleared by the [place] FDA  and has been authorized for detection and/or diagnosis of SARS-CoV-2 by FDA under an Emergency Use Authorization (EUA). This EUA will remain in effect (meaning this test can be used) for the duration of the COVID-19 declaration under Section 564(b)(1) of the Act, 21 U.S.C. section 360bbb-3(b)(1), unless the authorization is terminated or revoked.  Performed at [facility], [place] [address], [place], [place] [address]      Time coordinating discharge: Over 30 minutes  SIGNED:   Hosie Poisson, MD  [facility] 03/14/2021, 1:00 PM Pager   If 7PM-7AM, please contact night-coverage [email or web address] Password [email or web address]

## 2021-03-13 NOTE — Progress Notes (Signed)
Pt still restless and agitated trying to climb out of the siderails. Pt is trying to hit staff and uncooperative. MD notified. Haldol ordered. Will cont to monitor.

## 2021-03-13 NOTE — Progress Notes (Signed)
PROGRESS NOTE    Isaac [name]  YSA:[id number] DOB: 02-02-1941 DOA: 03/05/2021 PCP: Dettinger, Fransisca Kaufmann, MD    Chief Complaint  Patient presents with   Nausea   Fatigue   decreased appetite    Brief Narrative:   Isaac [name] is an 80 year old male with past medical history significant for dementia, CAD s/p CABG, chronic diastolic congestive heart failure, pulmonary fibrosis, CKD stage IIIa, TIA, hyperlipidemia, GERD who presents to [facility] ED 11/8 from home with weakness, decreased oral intake, shortness of breath and cough.  Spouse present at bedside provides history given his underlying dementia and altered mental status.  She reports that patient has been sick for the last 2 weeks with decreased oral intake and now with inability to ambulate.  At baseline walks with a cane.  Cough is productive of green sputum with some increased respiratory effort.  No complaints of pain, no nausea/vomiting/diarrhea.  No lower extremity edema. Assessment & Plan:   Principal Problem:   Acute respiratory failure with hypoxia (HCC) Active Problems:   Hyperlipidemia LDL goal <70   CAD (coronary artery disease), native coronary artery - LIMA-LAD, SVG-Diag, SVG-RCA 2002   Essential hypertension   Chronic diastolic CHF (congestive heart failure) (HCC)   Acute hypoxic respiratory failure, POA resolved Community acquired pneumonia Patient presenting to ED with 2-week history of progressive weakness, productive cough with green sputum.  Elevated white blood cell count of 14.4 with chest x-ray findings consistent with pneumonia.  Patient is known to be hypoxic with SPO2 88% on room air requiring 3 L nasal cannula to maintain adequate oxygenation.  Suspect etiology atypical versus gram-negative organism.  Completed 5-day course of azithromycin and now titrated off of submental oxygen. --Supportive care    Elevated troponin Demand ischemia from pneumonia.  No chest pain.  Cotinue to monitor.    Essential  hypertension Chronic diastolic congestive heart failure, compensated Holding lasix due to AKI. [name] --Metoprolol succinate 50 mg PO qAm and 25mg  PO qPM, resume the same.     CAD s/p CABG --Continue aspirin and statin   Hyperlipidemia: Simvastatin 20 mg p.o. daily   Pulmonary fibrosis --Continue home Pirfenidone 267 mg TID   GERD Stable.    Dementia with behavioral abnormalities --Memantine 10 mg p.o. twice daily --melatonin 3mg  PO qHS --added haldol prn.     Weakness/debility/deconditioning: --PT now recommending SNF on discharge, TOC for placement --Continue therapy efforts while inpatient     DVT prophylaxis: (Lovenox) Code Status: full code.  Family Communication: discussed with wife at bedside.  Disposition:   Status is: Inpatient  Remains inpatient appropriate because: derilium.        Consultants:  None.   Procedures: none.   Antimicrobials: none.    Subjective: Restless, confused,   Objective: Vitals:   03/12/21 2040 03/13/21 0454 03/13/21 0456 03/13/21 1434  BP: (!) 173/84 (!) 142/70  134/63  Pulse: 87 73  79  Resp: 14 16  20   Temp: (!) 97.5 F (36.4 C) 99.5 F (37.5 C)  97.8 F (36.6 C)  TempSrc: Oral Oral  Oral  SpO2: 99% 98%  98%  Weight:   57.3 kg   Height:        Intake/Output Summary (Last 24 hours) at 03/13/2021 1533 Last data filed at 03/13/2021 0648 Gross per 24 hour  Intake --  Output 650 ml  Net -650 ml   Filed Weights   03/11/21 0500 03/12/21 0323 03/13/21 0456  Weight: 60.4 kg 59.6 kg 57.3 kg  Examination:  General exam: Appears calm and comfortable  Respiratory system: Clear to auscultation. Respiratory effort normal. Cardiovascular system: S1 & S2 heard, RRR. No JVD,  No pedal edema. Gastrointestinal system: Abdomen is nondistended, soft and nontender.. Normal bowel sounds heard. Central nervous system: Alert but ocnfused.  Extremities: Symmetric 5 x 5 power. Skin: No rashes, lesions or ulcers Psychiatry:  cannot be assessed due to confusion.     Data Reviewed: I have personally reviewed following labs and imaging studies  CBC: Recent Labs  Lab 03/07/21 0349 03/08/21 0341 03/09/21 0435 03/10/21 0355  WBC 13.9* 12.4* 10.4 9.0  HGB 10.8* 10.3* 10.2* 11.4*  HCT 33.2* 30.8* 31.0* 34.8*  MCV 95.1 93.1 94.5 95.1  PLT 247 266 300 588    Basic Metabolic Panel: Recent Labs  Lab 03/07/21 0349 03/08/21 0341 03/09/21 0435 03/10/21 0355 03/13/21 0348  NA 140 139 138 139 141  K 3.0* 3.8 3.5 4.1 4.1  CL 101 104 103 105 105  CO2 25 26 26 27 27   GLUCOSE 114* 139* 112* 102* 118*  BUN 29* 31* 29* 28* 34*  CREATININE 1.21 1.07 1.29* 1.27* 1.46*  CALCIUM 8.7* 8.6* 8.8* 8.8* 10.0  MG 1.6* 2.2 1.9 1.9  --   PHOS  --  2.6  --   --   --     GFR: Estimated Creatinine Clearance: 32.7 mL/min (A) (by C-G formula based on SCr of 1.46 mg/dL (H)).  Liver Function Tests: No results for input(s): AST, ALT, ALKPHOS, BILITOT, PROT, ALBUMIN in the last 168 hours.  CBG: No results for input(s): GLUCAP in the last 168 hours.   Recent Results (from the past 240 hour(s))  Resp Panel by RT-PCR (Flu A&B, Covid) Nasopharyngeal Swab     Status: None   Collection Time: 03/05/21  6:17 PM   Specimen: Nasopharyngeal Swab; Nasopharyngeal(NP) swabs in vial transport medium  Result Value Ref Range Status   SARS Coronavirus 2 by RT PCR NEGATIVE NEGATIVE Final    Comment: (NOTE) SARS-CoV-2 target nucleic acids are NOT DETECTED.  The SARS-CoV-2 RNA is generally detectable in upper respiratory specimens during the acute phase of infection. The lowest concentration of SARS-CoV-2 viral copies this assay can detect is 138 copies/mL. A negative result does not preclude SARS-Cov-2 infection and should not be used as the sole basis for treatment or other patient management decisions. A negative result may occur with  improper specimen collection/handling, submission of specimen other than nasopharyngeal swab,  presence of viral mutation(s) within the areas targeted by this assay, and inadequate number of viral copies(<138 copies/mL). A negative result must be combined with clinical observations, patient history, and epidemiological information. The expected result is Negative.  Fact Sheet for Patients:  EntrepreneurPulse.com.au  Fact Sheet for Healthcare Providers:  IncredibleEmployment.be  This test is no t yet approved or cleared by the [place] FDA and  has been authorized for detection and/or diagnosis of SARS-CoV-2 by FDA under an Emergency Use Authorization (EUA). This EUA will remain  in effect (meaning this test can be used) for the duration of the COVID-19 declaration under Section 564(b)(1) of the Act, 21 U.S.C.section 360bbb-3(b)(1), unless the authorization is terminated  or revoked sooner.       Influenza A by PCR NEGATIVE NEGATIVE Final   Influenza B by PCR NEGATIVE NEGATIVE Final    Comment: (NOTE) The Xpert Xpress SARS-CoV-2/FLU/RSV plus assay is intended as an aid in the diagnosis of influenza from Nasopharyngeal swab specimens and should not be  used as a sole basis for treatment. Nasal washings and aspirates are unacceptable for Xpert Xpress SARS-CoV-2/FLU/RSV testing.  Fact Sheet for Patients: EntrepreneurPulse.com.au  Fact Sheet for Healthcare Providers: IncredibleEmployment.be  This test is not yet approved or cleared by the [place] FDA and has been authorized for detection and/or diagnosis of SARS-CoV-2 by FDA under an Emergency Use Authorization (EUA). This EUA will remain in effect (meaning this test can be used) for the duration of the COVID-19 declaration under Section 564(b)(1) of the Act, 21 U.S.C. section 360bbb-3(b)(1), unless the authorization is terminated or revoked.  Performed at [facility], [place] [address] [place], [place] [address]   Resp  Panel by RT-PCR (Flu A&B, Covid) Nasopharyngeal Swab     Status: None   Collection Time: 03/12/21  3:47 PM   Specimen: Nasopharyngeal Swab; Nasopharyngeal(NP) swabs in vial transport medium  Result Value Ref Range Status   SARS Coronavirus 2 by RT PCR NEGATIVE NEGATIVE Final    Comment: (NOTE) SARS-CoV-2 target nucleic acids are NOT DETECTED.  The SARS-CoV-2 RNA is generally detectable in upper respiratory specimens during the acute phase of infection. The lowest concentration of SARS-CoV-2 viral copies this assay can detect is 138 copies/mL. A negative result does not preclude SARS-Cov-2 infection and should not be used as the sole basis for treatment or other patient management decisions. A negative result may occur with  improper specimen collection/handling, submission of specimen other than nasopharyngeal swab, presence of viral mutation(s) within the areas targeted by this assay, and inadequate number of viral copies(<138 copies/mL). A negative result must be combined with clinical observations, patient history, and epidemiological information. The expected result is Negative.  Fact Sheet for Patients:  EntrepreneurPulse.com.au  Fact Sheet for Healthcare Providers:  IncredibleEmployment.be  This test is no t yet approved or cleared by the [place] FDA and  has been authorized for detection and/or diagnosis of SARS-CoV-2 by FDA under an Emergency Use Authorization (EUA). This EUA will remain  in effect (meaning this test can be used) for the duration of the COVID-19 declaration under Section 564(b)(1) of the Act, 21 U.S.C.section 360bbb-3(b)(1), unless the authorization is terminated  or revoked sooner.       Influenza A by PCR NEGATIVE NEGATIVE Final   Influenza B by PCR NEGATIVE NEGATIVE Final    Comment: (NOTE) The Xpert Xpress SARS-CoV-2/FLU/RSV plus assay is intended as an aid in the diagnosis of influenza from Nasopharyngeal  swab specimens and should not be used as a sole basis for treatment. Nasal washings and aspirates are unacceptable for Xpert Xpress SARS-CoV-2/FLU/RSV testing.  Fact Sheet for Patients: EntrepreneurPulse.com.au  Fact Sheet for Healthcare Providers: IncredibleEmployment.be  This test is not yet approved or cleared by the [place] FDA and has been authorized for detection and/or diagnosis of SARS-CoV-2 by FDA under an Emergency Use Authorization (EUA). This EUA will remain in effect (meaning this test can be used) for the duration of the COVID-19 declaration under Section 564(b)(1) of the Act, 21 U.S.C. section 360bbb-3(b)(1), unless the authorization is terminated or revoked.  Performed at [facility], [place] [address], [place], [place] [address]          Radiology Studies: No results found.      Scheduled Meds:  vitamin C  500 mg Oral Daily   aspirin EC  81 mg Oral Daily   cholecalciferol  2,000 Units Oral Daily   docusate sodium  100 mg Oral Daily   enoxaparin (LOVENOX) injection  40 mg Subcutaneous Q24H   feeding supplement  237 mL Oral TID BM   fluticasone furoate-vilanterol  1 puff Inhalation Daily   melatonin  3 mg Oral QHS   memantine  10 mg Oral BID   metoprolol succinate  50 mg Oral Daily   And   metoprolol succinate  25 mg Oral QHS   pantoprazole  40 mg Oral Daily   Pirfenidone  267 mg Oral 3 times per day   simvastatin  20 mg Oral q1800   vitamin B-12  1,000 mcg Oral Daily   Continuous Infusions:  sodium chloride       LOS: 8 days        Hosie Poisson, MD [facility]   To contact the attending provider between 7A-7P or the covering provider during after hours 7P-7A, please log into the web site [email or web address] and access using universal [place] password for that web site. If you do not have the password, please call the hospital operator.  03/13/2021, 3:33 PM

## 2021-03-13 NOTE — Progress Notes (Signed)
Nutrition Follow-up  DOCUMENTATION CODES:   Underweight  INTERVENTION:  - continue Ensure Enlive TID.    NUTRITION DIAGNOSIS:   Inadequate oral intake related to acute illness as evidenced by per patient/family report. -improving  GOAL:   Patient will meet greater than or equal to 90% of their needs -remains unmet on average  MONITOR:   PO intake, Supplement acceptance, Labs, Weight trends, Skin, I & O's  ASSESSMENT:   80 year old male with past medical history significant for dementia, CAD s/p CABG, chronic diastolic congestive heart failure, pulmonary fibrosis, CKD stage IIIa, TIA, hyperlipidemia, GERD, barretts esophagus, BPH and hiatal hernia who is admitted with CAP.  Patient is a/o to self only with poor safety awareness, poor judgement, and poor attention/concentration per documentation in the flow sheet.  Meal intakes since 11/11 have been variable between 25-75%, but seem to be trending upward across those days. Ensure Enlive TID is being accepted ~75% of the time offered.   Weight today is -8 lb compared to weight on 11/9. No information documented in the edema section of flow sheet this hospitalization.   MD note yesterday outlines that PT is recommending SNF on d/c and that patient is medically stable for d/c.    Labs reviewed; BUN: 34 mg/dl, creatinine: 1.46 mg/dl, GFR: 48 ml/min.  Medications reviewed; 500 mg ascorbic acid/day, 2000 units cholecalciferol/day, 100 mg colace/day, 10 mg oral lasix/day, 3 mg melatonin/day, 40 mg oral protonix/day, 1000 mcg oral cyanocobalamin/day.     NUTRITION - FOCUSED PHYSICAL EXAM:  Deferred   Diet Order:   Diet Order             Diet - low sodium heart healthy           DIET DYS 3 Room service appropriate? No; Fluid consistency: Thin  Diet effective now                   EDUCATION NEEDS:   No education needs have been identified at this time  Skin:  Skin Assessment: Reviewed RN Assessment  (ecchymosis)  Last BM:  11/16 (type 6 x1, medium amount)  Height:   Ht Readings from Last 1 Encounters:  03/06/21 5' 10.5" (1.791 m)    Weight:   Wt Readings from Last 1 Encounters:  03/13/21 57.3 kg    Estimated Nutritional Needs:  Kcal:  1800-2100kcal/day Protein:  90-105g/day Fluid:  1.6-1.9L/day      Jarome Matin, MS, RD, LDN, CNSC Inpatient Clinical Dietitian RD pager # available in [place]  After hours/weekend pager # available in [facility]

## 2021-03-13 NOTE — Progress Notes (Signed)
Physical Therapy Treatment Patient Details Name: Isaac [name] MRN: [id number] DOB: 1940/07/15 Today's Date: 03/13/2021   History of Present Illness Isaac [name] is a 80 y.o. male presents with generalized weakness, nausea, fatigue and decreased appetite. Pt admitted with Acute hypoxic respiratory failure secondary to community-acquired pneumonia in the setting of pulmonary fibrosis. PMH: CAD, CHF, dementia, HTN, GERD, hyperlipidemia, HTN, MI, CKD, CABG 2002    PT Comments    General Comments: AxO x 1 mainly sleepy/groggy General bed mobility comments: assist for LEs over EOB and to raise trunk General transfer comment: required + 2 assist to rise and achieve partial upright posture.  Poor forward flex posture with B hips and knees flexed.  Also assisted with a toilet transfer.  Very unsteady.  Very weak.General Gait Details: VERY unsteady groggy gait with poor forward flex posture and limited amb distance to and from bathrrom.  HIGH FALL RISK.  Poor self ability to use walker. Pt will need ST Rehab at SNF.   Recommendations for follow up therapy are one component of a multi-disciplinary discharge planning process, led by the attending physician.  Recommendations may be updated based on patient status, additional functional criteria and insurance authorization.  Follow Up Recommendations  Skilled nursing-short term rehab (<3 hours/day)     Assistance Recommended at Discharge Frequent or constant Supervision/Assistance  Equipment Recommendations  None recommended by PT    Recommendations for Other Services       Precautions / Restrictions Precautions Precautions: Fall Precaution Comments: monitor O2     Mobility  Bed Mobility Overal bed mobility: Needs Assistance Bed Mobility: Supine to Sit     Supine to sit: Mod assist     General bed mobility comments: assist for LEs over EOB and to raise trunk    Transfers Overall transfer level: Needs assistance Equipment used:  Rolling walker (2 wheels);None Transfers: Sit to/from Stand Sit to Stand: Mod assist;+2 physical assistance;+2 safety/equipment           General transfer comment: required + 2 assist to rise and achieve partial upright posture.  Poor forward flex posture with B hips and knees flexed.  Also assisted with a toilet transfer.  Very unsteady.  Very weak.    Ambulation/Gait Ambulation/Gait assistance: Mod assist;+2 physical assistance;+2 safety/equipment;Max assist Gait Distance (Feet): 5 Feet Assistive device: Rolling walker (2 wheels);None Gait Pattern/deviations: Step-to pattern;Decreased stride length;Trunk flexed;Narrow base of support Gait velocity: decreased     General Gait Details: VERY unsteady groggy gait with poor forward flex posture and limited amb distance to and from bathrrom.  HIGH FALL RISK.  Poor self ability to use walker.   Stairs             Wheelchair Mobility    Modified Rankin (Stroke Patients Only)       Balance                                            Cognition   Behavior During Therapy: Flat affect Overall Cognitive Status: No family/caregiver present to determine baseline cognitive functioning                                 General Comments: AxO x 1 mainly sleepy/groggy        Exercises      General Comments  Pertinent Vitals/Pain Pain Assessment: No/denies pain    Home Living                          Prior Function            PT Goals (current goals can now be found in the care plan section) Progress towards PT goals: Progressing toward goals    Frequency    Min 3X/week      PT Plan Discharge plan needs to be updated    Co-evaluation              AM-PAC PT "6 Clicks" Mobility   Outcome Measure  Help needed turning from your back to your side while in a flat bed without using bedrails?: A Lot Help needed moving from lying on your back to sitting on  the side of a flat bed without using bedrails?: A Lot Help needed moving to and from a bed to a chair (including a wheelchair)?: A Lot Help needed standing up from a chair using your arms (e.g., wheelchair or bedside chair)?: A Lot Help needed to walk in hospital room?: A Lot Help needed climbing 3-5 steps with a railing? : Total 6 Click Score: 11    End of Session Equipment Utilized During Treatment: Gait belt Activity Tolerance: Patient limited by lethargy Patient left: in chair;with chair alarm set;with call bell/phone within reach Nurse Communication: Mobility status PT Visit Diagnosis: Unsteadiness on feet (R26.81);Other abnormalities of gait and mobility (R26.89);Muscle weakness (generalized) (M62.81)     Time: 3646-8032 PT Time Calculation (min) (ACUTE ONLY): 25 min  Charges:  $Therapeutic Activity: 8-22 mins                     {Carlee Tesfaye  PTA Acute  Rehabilitation Services Pager      [phone] Office      [phone]

## 2021-03-13 NOTE — Plan of Care (Signed)
  Problem: Safety: Goal: Non-violent Restraint(s) Outcome: Progressing   Problem: Education: Goal: Knowledge of General Education information will improve Description: Including pain rating scale, medication(s)/side effects and non-pharmacologic comfort measures Outcome: Progressing   Problem: Skin Integrity: Goal: Risk for impaired skin integrity will decrease Outcome: Progressing   Problem: Safety: Goal: Ability to remain free from injury will improve Outcome: Progressing   Problem: Pain Managment: Goal: General experience of comfort will improve Outcome: Progressing   Problem: Elimination: Goal: Will not experience complications related to bowel motility Outcome: Progressing Goal: Will not experience complications related to urinary retention Outcome: Progressing   Problem: Coping: Goal: Level of anxiety will decrease Outcome: Progressing   Problem: Nutrition: Goal: Adequate nutrition will be maintained Outcome: Progressing   Problem: Activity: Goal: Risk for activity intolerance will decrease Outcome: Progressing   Problem: Clinical Measurements: Goal: Ability to maintain clinical measurements within normal limits will improve Outcome: Progressing Goal: Will remain free from infection Outcome: Progressing Goal: Diagnostic test results will improve Outcome: Progressing Goal: Respiratory complications will improve Outcome: Progressing Goal: Cardiovascular complication will be avoided Outcome: Progressing   Problem: Health Behavior/Discharge Planning: Goal: Ability to manage health-related needs will improve Outcome: Progressing

## 2021-03-14 DIAGNOSIS — G9341 Metabolic encephalopathy: Secondary | ICD-10-CM | POA: Diagnosis not present

## 2021-03-14 DIAGNOSIS — K219 Gastro-esophageal reflux disease without esophagitis: Secondary | ICD-10-CM | POA: Diagnosis not present

## 2021-03-14 DIAGNOSIS — E785 Hyperlipidemia, unspecified: Secondary | ICD-10-CM | POA: Diagnosis not present

## 2021-03-14 DIAGNOSIS — E538 Deficiency of other specified B group vitamins: Secondary | ICD-10-CM | POA: Diagnosis not present

## 2021-03-14 DIAGNOSIS — Z7401 Bed confinement status: Secondary | ICD-10-CM | POA: Diagnosis not present

## 2021-03-14 DIAGNOSIS — R0689 Other abnormalities of breathing: Secondary | ICD-10-CM | POA: Diagnosis not present

## 2021-03-14 DIAGNOSIS — J8410 Pulmonary fibrosis, unspecified: Secondary | ICD-10-CM | POA: Diagnosis not present

## 2021-03-14 DIAGNOSIS — I2510 Atherosclerotic heart disease of native coronary artery without angina pectoris: Secondary | ICD-10-CM | POA: Diagnosis not present

## 2021-03-14 DIAGNOSIS — R0902 Hypoxemia: Secondary | ICD-10-CM | POA: Diagnosis not present

## 2021-03-14 DIAGNOSIS — Z79899 Other long term (current) drug therapy: Secondary | ICD-10-CM | POA: Diagnosis not present

## 2021-03-14 DIAGNOSIS — I10 Essential (primary) hypertension: Secondary | ICD-10-CM | POA: Diagnosis not present

## 2021-03-14 DIAGNOSIS — Z743 Need for continuous supervision: Secondary | ICD-10-CM | POA: Diagnosis not present

## 2021-03-14 DIAGNOSIS — R279 Unspecified lack of coordination: Secondary | ICD-10-CM | POA: Diagnosis not present

## 2021-03-14 DIAGNOSIS — I130 Hypertensive heart and chronic kidney disease with heart failure and stage 1 through stage 4 chronic kidney disease, or unspecified chronic kidney disease: Secondary | ICD-10-CM | POA: Diagnosis not present

## 2021-03-14 DIAGNOSIS — D649 Anemia, unspecified: Secondary | ICD-10-CM | POA: Diagnosis not present

## 2021-03-14 DIAGNOSIS — M2550 Pain in unspecified joint: Secondary | ICD-10-CM | POA: Diagnosis not present

## 2021-03-14 DIAGNOSIS — N183 Chronic kidney disease, stage 3 unspecified: Secondary | ICD-10-CM | POA: Diagnosis not present

## 2021-03-14 DIAGNOSIS — I5032 Chronic diastolic (congestive) heart failure: Secondary | ICD-10-CM | POA: Diagnosis not present

## 2021-03-14 DIAGNOSIS — D510 Vitamin B12 deficiency anemia due to intrinsic factor deficiency: Secondary | ICD-10-CM | POA: Diagnosis not present

## 2021-03-14 DIAGNOSIS — I480 Paroxysmal atrial fibrillation: Secondary | ICD-10-CM | POA: Diagnosis not present

## 2021-03-14 DIAGNOSIS — E559 Vitamin D deficiency, unspecified: Secondary | ICD-10-CM | POA: Diagnosis not present

## 2021-03-14 DIAGNOSIS — I517 Cardiomegaly: Secondary | ICD-10-CM | POA: Diagnosis not present

## 2021-03-14 DIAGNOSIS — Z043 Encounter for examination and observation following other accident: Secondary | ICD-10-CM | POA: Diagnosis not present

## 2021-03-14 DIAGNOSIS — Z8679 Personal history of other diseases of the circulatory system: Secondary | ICD-10-CM | POA: Diagnosis not present

## 2021-03-14 DIAGNOSIS — E039 Hypothyroidism, unspecified: Secondary | ICD-10-CM | POA: Diagnosis not present

## 2021-03-14 DIAGNOSIS — J9690 Respiratory failure, unspecified, unspecified whether with hypoxia or hypercapnia: Secondary | ICD-10-CM | POA: Diagnosis not present

## 2021-03-14 DIAGNOSIS — N1831 Chronic kidney disease, stage 3a: Secondary | ICD-10-CM | POA: Diagnosis not present

## 2021-03-14 DIAGNOSIS — R404 Transient alteration of awareness: Secondary | ICD-10-CM | POA: Diagnosis not present

## 2021-03-14 DIAGNOSIS — E860 Dehydration: Secondary | ICD-10-CM | POA: Diagnosis not present

## 2021-03-14 LAB — BASIC METABOLIC PANEL
Anion gap: 8 (ref 5–15)
BUN: 34 mg/dL — ABNORMAL HIGH (ref 8–23)
CO2: 27 mmol/L (ref 22–32)
Calcium: 10 mg/dL (ref 8.9–10.3)
Chloride: 107 mmol/L (ref 98–111)
Creatinine, Ser: 1.41 mg/dL — ABNORMAL HIGH (ref 0.61–1.24)
GFR, Estimated: 50 mL/min — ABNORMAL LOW (ref >60)
Glucose, Bld: 146 mg/dL — ABNORMAL HIGH (ref 70–99)
Potassium: 4.2 mmol/L (ref 3.5–5.1)
Sodium: 142 mmol/L (ref 135–145)

## 2021-03-14 LAB — MAGNESIUM: Magnesium: 2.2 mg/dL (ref 1.7–2.4)

## 2021-03-14 MED ORDER — HYDROXYZINE HCL 25 MG PO TABS: 25.0000 mg | ORAL_TABLET | Freq: Three times a day (TID) | ORAL | 0 refills | Status: AC | PRN

## 2021-03-14 NOTE — Care Management Important Message (Signed)
Important Message  Patient Details IM Letter placed in Patients room. Name: Isaac [name] MRN: [id number] Date of Birth: [birth year]   Medicare Important Message Given:  Yes     Kerin Salen 03/14/2021, 11:05 AM

## 2021-03-14 NOTE — TOC Progression Note (Signed)
Transition of Care [facility]) - Progression Note    Patient Details  Name: Isaac [name] MRN: [id number] Date of Birth: Nov 23, 1940  Transition of Care [employer]) CM/SW Contact  Purcell Mouton, RN Phone Number: 03/14/2021, 1:03 PM  Clinical Narrative:    [name] was called. Wife and RN are aware.    Expected Discharge Plan: [place] Barriers to Discharge: No Barriers Identified  Expected Discharge Plan and Services Expected Discharge Plan: [place]   Discharge Planning Services: CM Consult   Living arrangements for the past 2 months: Single Family Home Expected Discharge Date: 03/14/21                                     Social Determinants of Health (SDOH) Interventions    Readmission Risk Interventions Readmission Risk Prevention Plan 12/03/2020  Post Dischage Appt Complete  Medication Screening Complete  Transportation Screening Complete  Some recent data might be hidden

## 2021-03-14 NOTE — Plan of Care (Signed)
  Problem: Clinical Measurements: Goal: Cardiovascular complication will be avoided Outcome: Adequate for Discharge   Problem: Clinical Measurements: Goal: Respiratory complications will improve Outcome: Adequate for Discharge   Problem: Clinical Measurements: Goal: Ability to maintain clinical measurements within normal limits will improve Outcome: Adequate for Discharge   Problem: Clinical Measurements: Goal: Will remain free from infection Outcome: Adequate for Discharge

## 2021-03-14 NOTE — Consult Note (Addendum)
[facility] Nurse Consult Note: Reason for Consult: Consult requested for buttocks Wound type: Bilat buttocks/sacrum with generalized edema and erythremia; appearance is consistent with moisture associated skin damage related to frequent incontinent loose stools. Affected area is approx 7X7cm Pressure Injury POA: This is not a pressure injury. Dressing procedure/placement/frequency: Topical treatment orders provided for bedside nurses to perform as follows to repel moisture and promote healing: Leave dressing off buttocks, since it is trapping stool underneath.  Apply barrier cream and antifungal powder to buttocks with each turning and cleaning session. Please re-consult if further assistance is needed.  Thank-you,  Julien Girt MSN, [place], [place], [place], [place]

## 2021-03-14 NOTE — Progress Notes (Signed)
Called the Facility and spoke with Musician.

## 2021-03-14 NOTE — Progress Notes (Signed)
Attempted to call report at [facility] and was on hold for awhile. Will attempt again.

## 2021-03-18 DIAGNOSIS — R0689 Other abnormalities of breathing: Secondary | ICD-10-CM | POA: Diagnosis not present

## 2021-03-18 DIAGNOSIS — Z043 Encounter for examination and observation following other accident: Secondary | ICD-10-CM | POA: Diagnosis not present

## 2021-03-18 DIAGNOSIS — I10 Essential (primary) hypertension: Secondary | ICD-10-CM | POA: Diagnosis not present

## 2021-03-19 DIAGNOSIS — E559 Vitamin D deficiency, unspecified: Secondary | ICD-10-CM | POA: Diagnosis not present

## 2021-03-19 DIAGNOSIS — E538 Deficiency of other specified B group vitamins: Secondary | ICD-10-CM | POA: Diagnosis not present

## 2021-03-19 DIAGNOSIS — J8410 Pulmonary fibrosis, unspecified: Secondary | ICD-10-CM | POA: Diagnosis not present

## 2021-03-20 DIAGNOSIS — J8410 Pulmonary fibrosis, unspecified: Secondary | ICD-10-CM | POA: Diagnosis not present

## 2021-03-20 DIAGNOSIS — E785 Hyperlipidemia, unspecified: Secondary | ICD-10-CM | POA: Diagnosis not present

## 2021-03-22 DIAGNOSIS — I480 Paroxysmal atrial fibrillation: Secondary | ICD-10-CM | POA: Diagnosis not present

## 2021-03-22 DIAGNOSIS — I2510 Atherosclerotic heart disease of native coronary artery without angina pectoris: Secondary | ICD-10-CM | POA: Diagnosis not present

## 2021-03-22 DIAGNOSIS — N183 Chronic kidney disease, stage 3 unspecified: Secondary | ICD-10-CM | POA: Diagnosis not present

## 2021-03-26 DIAGNOSIS — Z8679 Personal history of other diseases of the circulatory system: Secondary | ICD-10-CM | POA: Diagnosis not present

## 2021-03-27 DIAGNOSIS — J8410 Pulmonary fibrosis, unspecified: Secondary | ICD-10-CM | POA: Diagnosis not present

## 2021-03-27 DIAGNOSIS — I10 Essential (primary) hypertension: Secondary | ICD-10-CM | POA: Diagnosis not present

## 2021-03-29 ENCOUNTER — Telehealth: Payer: Self-pay | Admitting: Family Medicine

## 2021-03-29 NOTE — Telephone Encounter (Signed)
Appt made

## 2021-03-31 DIAGNOSIS — R0902 Hypoxemia: Secondary | ICD-10-CM | POA: Diagnosis not present

## 2021-03-31 DIAGNOSIS — Z7401 Bed confinement status: Secondary | ICD-10-CM | POA: Diagnosis not present

## 2021-03-31 DIAGNOSIS — Z743 Need for continuous supervision: Secondary | ICD-10-CM | POA: Diagnosis not present

## 2021-03-31 DIAGNOSIS — R279 Unspecified lack of coordination: Secondary | ICD-10-CM | POA: Diagnosis not present

## 2021-03-31 DIAGNOSIS — R404 Transient alteration of awareness: Secondary | ICD-10-CM | POA: Diagnosis not present

## 2021-04-11 ENCOUNTER — Ambulatory Visit: Payer: Medicare Other | Admitting: Adult Health

## 2021-04-12 ENCOUNTER — Ambulatory Visit: Payer: Medicare Other | Admitting: Family Medicine

## 2021-04-28 DEATH — deceased

## 2021-07-08 ENCOUNTER — Ambulatory Visit: Payer: Medicare Other | Admitting: Family Medicine

## 2021-10-07 ENCOUNTER — Ambulatory Visit: Payer: BC Managed Care – PPO
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
## 2023-12-02 NOTE — Telephone Encounter (Signed)
 SABRA
